# Patient Record
Sex: Male | Born: 1940
Health system: Southern US, Community
[De-identification: ages and names within clinical notes are randomized; demographics above are authoritative.]

## PROBLEM LIST (undated history)

## (undated) DIAGNOSIS — E785 Hyperlipidemia, unspecified: Secondary | ICD-10-CM

## (undated) DIAGNOSIS — Z974 Presence of external hearing-aid: Secondary | ICD-10-CM

## (undated) DIAGNOSIS — F329 Major depressive disorder, single episode, unspecified: Secondary | ICD-10-CM

## (undated) DIAGNOSIS — R918 Other nonspecific abnormal finding of lung field: Secondary | ICD-10-CM

## (undated) DIAGNOSIS — I499 Cardiac arrhythmia, unspecified: Secondary | ICD-10-CM

## (undated) DIAGNOSIS — Z86010 Personal history of colonic polyps: Secondary | ICD-10-CM

## (undated) DIAGNOSIS — M1990 Unspecified osteoarthritis, unspecified site: Secondary | ICD-10-CM

## (undated) DIAGNOSIS — C3490 Malignant neoplasm of unspecified part of unspecified bronchus or lung: Secondary | ICD-10-CM

## (undated) DIAGNOSIS — Z87442 Personal history of urinary calculi: Secondary | ICD-10-CM

## (undated) DIAGNOSIS — I10 Essential (primary) hypertension: Secondary | ICD-10-CM

## (undated) DIAGNOSIS — I4891 Unspecified atrial fibrillation: Secondary | ICD-10-CM

## (undated) DIAGNOSIS — H269 Unspecified cataract: Secondary | ICD-10-CM

## (undated) DIAGNOSIS — N200 Calculus of kidney: Secondary | ICD-10-CM

## (undated) DIAGNOSIS — L219 Seborrheic dermatitis, unspecified: Secondary | ICD-10-CM

## (undated) HISTORY — PX: COLONOSCOPY: SHX174

## (undated) HISTORY — PX: JOINT REPLACEMENT: SHX530

## (undated) HISTORY — PX: WISDOM TOOTH EXTRACTION: SHX21

## (undated) HISTORY — DX: Unspecified atrial fibrillation: I48.91

## (undated) HISTORY — PX: CATARACT EXTRACTION W/ INTRAOCULAR LENS IMPLANT: SHX1309

## (undated) HISTORY — DX: Hyperlipidemia, unspecified: E78.5

## (undated) HISTORY — PX: RADIOFREQUENCY ABLATION: SHX2290

## (undated) HISTORY — DX: Major depressive disorder, single episode, unspecified: F32.9

## (undated) HISTORY — DX: Seborrheic dermatitis, unspecified: L21.9

## (undated) HISTORY — PX: ROTATOR CUFF REPAIR: SHX139

## (undated) HISTORY — DX: Calculus of kidney: N20.0

## (undated) HISTORY — PX: TONSILLECTOMY: SHX5217

## (undated) HISTORY — DX: Essential (primary) hypertension: I10

## (undated) HISTORY — DX: Personal history of colonic polyps: Z86.010

---

## 2001-12-27 ENCOUNTER — Encounter (INDEPENDENT_AMBULATORY_CARE_PROVIDER_SITE_OTHER): Payer: Self-pay | Admitting: Specialist

## 2001-12-27 ENCOUNTER — Ambulatory Visit (HOSPITAL_COMMUNITY): Admission: RE | Admit: 2001-12-27 | Discharge: 2001-12-27 | Payer: Self-pay | Admitting: Gastroenterology

## 2004-03-13 ENCOUNTER — Ambulatory Visit: Payer: Self-pay | Admitting: Internal Medicine

## 2004-03-20 ENCOUNTER — Ambulatory Visit: Payer: Self-pay | Admitting: Internal Medicine

## 2004-08-29 ENCOUNTER — Ambulatory Visit: Payer: Self-pay | Admitting: Internal Medicine

## 2004-09-10 ENCOUNTER — Ambulatory Visit: Payer: Self-pay | Admitting: Internal Medicine

## 2004-09-17 ENCOUNTER — Ambulatory Visit: Payer: Self-pay | Admitting: Internal Medicine

## 2004-11-27 ENCOUNTER — Ambulatory Visit: Payer: Self-pay | Admitting: Internal Medicine

## 2005-03-19 ENCOUNTER — Ambulatory Visit: Payer: Self-pay | Admitting: Internal Medicine

## 2005-03-31 ENCOUNTER — Ambulatory Visit: Payer: Self-pay | Admitting: Internal Medicine

## 2005-09-21 ENCOUNTER — Ambulatory Visit: Payer: Self-pay | Admitting: Internal Medicine

## 2005-09-29 ENCOUNTER — Ambulatory Visit: Payer: Self-pay | Admitting: Internal Medicine

## 2005-10-09 ENCOUNTER — Ambulatory Visit: Payer: Self-pay | Admitting: Internal Medicine

## 2005-12-24 ENCOUNTER — Ambulatory Visit: Payer: Self-pay | Admitting: Internal Medicine

## 2006-03-25 ENCOUNTER — Ambulatory Visit: Payer: Self-pay | Admitting: Internal Medicine

## 2006-03-25 LAB — CONVERTED CEMR LAB
ALT: 22 units/L (ref 0–40)
AST: 25 units/L (ref 0–37)
Albumin: 3.4 g/dL — ABNORMAL LOW (ref 3.5–5.2)
Alkaline Phosphatase: 45 units/L (ref 39–117)
BUN: 12 mg/dL (ref 6–23)
Basophils Absolute: 0.1 10*3/uL (ref 0.0–0.1)
Basophils Relative: 0.8 % (ref 0.0–1.0)
Bilirubin, Direct: 0.1 mg/dL (ref 0.0–0.3)
CO2: 31 meq/L (ref 19–32)
Calcium: 9.3 mg/dL (ref 8.4–10.5)
Chloride: 101 meq/L (ref 96–112)
Creatinine, Ser: 1.1 mg/dL (ref 0.4–1.5)
Eosinophil percent: 1.8 % (ref 0.0–5.0)
GFR calc non Af Amer: 72 mL/min
Glomerular Filtration Rate, Af Am: 87 mL/min/{1.73_m2}
Glucose, Bld: 108 mg/dL — ABNORMAL HIGH (ref 70–99)
HCT: 44.5 % (ref 39.0–52.0)
Hemoglobin: 15.8 g/dL (ref 13.0–17.0)
Lymphocytes Relative: 30.6 % (ref 12.0–46.0)
MCHC: 35.4 g/dL (ref 30.0–36.0)
MCV: 96.1 fL (ref 78.0–100.0)
Monocytes Absolute: 0.6 10*3/uL (ref 0.2–0.7)
Monocytes Relative: 8.7 % (ref 3.0–11.0)
Neutro Abs: 4.3 10*3/uL (ref 1.4–7.7)
Neutrophils Relative %: 58.1 % (ref 43.0–77.0)
PSA: 0.4 ng/mL (ref 0.10–4.00)
Platelets: 188 10*3/uL (ref 150–400)
Potassium: 4.5 meq/L (ref 3.5–5.1)
RBC: 4.63 M/uL (ref 4.22–5.81)
RDW: 11.9 % (ref 11.5–14.6)
Sodium: 139 meq/L (ref 135–145)
Total Bilirubin: 0.9 mg/dL (ref 0.3–1.2)
Total Protein: 6.6 g/dL (ref 6.0–8.3)
WBC: 7.3 10*3/uL (ref 4.5–10.5)

## 2006-09-06 ENCOUNTER — Ambulatory Visit: Payer: Self-pay | Admitting: Internal Medicine

## 2007-02-28 ENCOUNTER — Ambulatory Visit: Payer: Self-pay | Admitting: Internal Medicine

## 2007-02-28 LAB — CONVERTED CEMR LAB
ALT: 21 units/L (ref 0–53)
AST: 23 units/L (ref 0–37)
Albumin: 3.9 g/dL (ref 3.5–5.2)
Alkaline Phosphatase: 43 units/L (ref 39–117)
BUN: 10 mg/dL (ref 6–23)
Basophils Absolute: 0.1 10*3/uL (ref 0.0–0.1)
Basophils Relative: 0.7 % (ref 0.0–1.0)
Bilirubin, Direct: 0.2 mg/dL (ref 0.0–0.3)
CO2: 29 meq/L (ref 19–32)
Calcium: 9.5 mg/dL (ref 8.4–10.5)
Chloride: 106 meq/L (ref 96–112)
Cholesterol: 205 mg/dL (ref 0–200)
Creatinine, Ser: 1.1 mg/dL (ref 0.4–1.5)
Direct LDL: 142.6 mg/dL
Eosinophils Absolute: 0.1 10*3/uL (ref 0.0–0.6)
Eosinophils Relative: 1.4 % (ref 0.0–5.0)
GFR calc Af Amer: 87 mL/min
GFR calc non Af Amer: 72 mL/min
Glucose, Bld: 105 mg/dL — ABNORMAL HIGH (ref 70–99)
HCT: 44 % (ref 39.0–52.0)
HDL: 44.3 mg/dL (ref >39.0)
Hemoglobin: 15.5 g/dL (ref 13.0–17.0)
Hgb A1c MFr Bld: 5.4 % (ref 4.6–6.0)
Lymphocytes Relative: 35 % (ref 12.0–46.0)
MCHC: 35.1 g/dL (ref 30.0–36.0)
MCV: 97.8 fL (ref 78.0–100.0)
Monocytes Absolute: 0.6 10*3/uL (ref 0.2–0.7)
Monocytes Relative: 7.8 % (ref 3.0–11.0)
Neutro Abs: 4.1 10*3/uL (ref 1.4–7.7)
Neutrophils Relative %: 55.1 % (ref 43.0–77.0)
PSA: 0.34 ng/mL (ref 0.10–4.00)
Platelets: 179 10*3/uL (ref 150–400)
Potassium: 4.3 meq/L (ref 3.5–5.1)
RBC: 4.5 M/uL (ref 4.22–5.81)
RDW: 11.7 % (ref 11.5–14.6)
Sodium: 142 meq/L (ref 135–145)
TSH: 4.56 microintl units/mL (ref 0.35–5.50)
Total Bilirubin: 0.9 mg/dL (ref 0.3–1.2)
Total CHOL/HDL Ratio: 4.6
Total Protein: 6.7 g/dL (ref 6.0–8.3)
Triglycerides: 100 mg/dL (ref 0–149)
VLDL: 20 mg/dL (ref 0–40)
WBC: 7.5 10*3/uL (ref 4.5–10.5)

## 2007-03-04 DIAGNOSIS — I10 Essential (primary) hypertension: Secondary | ICD-10-CM | POA: Insufficient documentation

## 2007-03-04 DIAGNOSIS — F3289 Other specified depressive episodes: Secondary | ICD-10-CM

## 2007-03-04 DIAGNOSIS — E785 Hyperlipidemia, unspecified: Secondary | ICD-10-CM

## 2007-03-04 DIAGNOSIS — I4891 Unspecified atrial fibrillation: Secondary | ICD-10-CM

## 2007-03-04 DIAGNOSIS — Z860100 Personal history of colon polyps, unspecified: Secondary | ICD-10-CM | POA: Insufficient documentation

## 2007-03-04 DIAGNOSIS — F329 Major depressive disorder, single episode, unspecified: Secondary | ICD-10-CM

## 2007-03-04 HISTORY — DX: Essential (primary) hypertension: I10

## 2007-03-04 HISTORY — DX: Major depressive disorder, single episode, unspecified: F32.9

## 2007-03-04 HISTORY — DX: Other specified depressive episodes: F32.89

## 2007-03-04 HISTORY — DX: Hyperlipidemia, unspecified: E78.5

## 2007-03-04 HISTORY — DX: Unspecified atrial fibrillation: I48.91

## 2007-03-07 ENCOUNTER — Ambulatory Visit: Payer: Self-pay | Admitting: Internal Medicine

## 2007-03-21 ENCOUNTER — Ambulatory Visit: Payer: Self-pay | Admitting: Gastroenterology

## 2007-04-05 ENCOUNTER — Encounter: Payer: Self-pay | Admitting: Gastroenterology

## 2007-04-05 ENCOUNTER — Encounter: Payer: Self-pay | Admitting: Internal Medicine

## 2007-04-05 ENCOUNTER — Ambulatory Visit: Payer: Self-pay | Admitting: Gastroenterology

## 2007-04-05 DIAGNOSIS — Z860100 Personal history of colon polyps, unspecified: Secondary | ICD-10-CM

## 2007-04-05 HISTORY — DX: Personal history of colon polyps, unspecified: Z86.0100

## 2007-04-05 HISTORY — DX: Personal history of colonic polyps: Z86.010

## 2007-04-18 ENCOUNTER — Ambulatory Visit: Payer: Self-pay | Admitting: Internal Medicine

## 2007-07-19 ENCOUNTER — Ambulatory Visit: Payer: Self-pay | Admitting: Internal Medicine

## 2007-07-19 DIAGNOSIS — L219 Seborrheic dermatitis, unspecified: Secondary | ICD-10-CM

## 2007-07-19 HISTORY — DX: Seborrheic dermatitis, unspecified: L21.9

## 2008-01-18 ENCOUNTER — Ambulatory Visit: Payer: Self-pay | Admitting: Internal Medicine

## 2008-03-09 ENCOUNTER — Telehealth: Payer: Self-pay | Admitting: Internal Medicine

## 2008-07-19 ENCOUNTER — Ambulatory Visit: Payer: Self-pay | Admitting: Internal Medicine

## 2008-07-19 LAB — CONVERTED CEMR LAB
Blood in Urine, dipstick: NEGATIVE
Glucose, Urine, Semiquant: NEGATIVE
Nitrite: NEGATIVE
Specific Gravity, Urine: 1.02
Urobilinogen, UA: 0.2
pH: 5.5

## 2008-07-20 LAB — CONVERTED CEMR LAB
ALT: 27 units/L (ref 0–53)
AST: 26 units/L (ref 0–37)
Albumin: 4 g/dL (ref 3.5–5.2)
Alkaline Phosphatase: 50 units/L (ref 39–117)
BUN: 11 mg/dL (ref 6–23)
Basophils Relative: 1 % (ref 0.0–3.0)
Bilirubin, Direct: 0 mg/dL (ref 0.0–0.3)
CO2: 30 meq/L (ref 19–32)
Calcium: 9.5 mg/dL (ref 8.4–10.5)
Chloride: 106 meq/L (ref 96–112)
Cholesterol: 219 mg/dL — ABNORMAL HIGH (ref 0–200)
Creatinine, Ser: 1 mg/dL (ref 0.4–1.5)
Direct LDL: 152.9 mg/dL
Eosinophils Relative: 1.8 % (ref 0.0–5.0)
GFR calc non Af Amer: 79.41 mL/min (ref >60)
Glucose, Bld: 110 mg/dL — ABNORMAL HIGH (ref 70–99)
HCT: 45.8 % (ref 39.0–52.0)
HDL: 42.8 mg/dL (ref >39.00)
Hemoglobin: 15.5 g/dL (ref 13.0–17.0)
Lymphocytes Relative: 28.6 % (ref 12.0–46.0)
MCHC: 33.7 g/dL (ref 30.0–36.0)
MCV: 99.9 fL (ref 78.0–100.0)
Monocytes Relative: 7.5 % (ref 3.0–12.0)
Neutrophils Relative %: 61.1 % (ref 43.0–77.0)
PSA: 0.38 ng/mL (ref 0.10–4.00)
Platelets: 187 10*3/uL (ref 150.0–400.0)
Potassium: 4.8 meq/L (ref 3.5–5.1)
RBC: 4.59 M/uL (ref 4.22–5.81)
RDW: 12.2 % (ref 11.5–14.6)
Sodium: 143 meq/L (ref 135–145)
TSH: 3.94 microintl units/mL (ref 0.35–5.50)
Total Bilirubin: 0.9 mg/dL (ref 0.3–1.2)
Total CHOL/HDL Ratio: 5
Total Protein: 7.2 g/dL (ref 6.0–8.3)
Triglycerides: 116 mg/dL (ref 0.0–149.0)
VLDL: 23.2 mg/dL (ref 0.0–40.0)
WBC: 7.7 10*3/uL (ref 4.5–10.5)

## 2009-01-04 ENCOUNTER — Ambulatory Visit: Payer: Self-pay | Admitting: Internal Medicine

## 2009-02-04 ENCOUNTER — Ambulatory Visit: Payer: Self-pay | Admitting: Internal Medicine

## 2009-02-04 LAB — CONVERTED CEMR LAB
Bilirubin Urine: NEGATIVE
Blood in Urine, dipstick: NEGATIVE
Glucose, Urine, Semiquant: NEGATIVE
Ketones, urine, test strip: NEGATIVE
Nitrite: NEGATIVE
Protein, U semiquant: NEGATIVE
Specific Gravity, Urine: 1.015
Urobilinogen, UA: 0.2
WBC Urine, dipstick: NEGATIVE
pH: 5.5

## 2009-02-05 ENCOUNTER — Encounter: Payer: Self-pay | Admitting: Internal Medicine

## 2009-04-11 ENCOUNTER — Ambulatory Visit: Payer: Self-pay | Admitting: Internal Medicine

## 2009-04-11 LAB — CONVERTED CEMR LAB
Blood in Urine, dipstick: NEGATIVE
Ketones, urine, test strip: NEGATIVE
Nitrite: NEGATIVE
Protein, U semiquant: NEGATIVE
Urobilinogen, UA: 0.2
WBC Urine, dipstick: NEGATIVE

## 2009-04-12 LAB — CONVERTED CEMR LAB
ALT: 23 units/L (ref 0–53)
AST: 25 units/L (ref 0–37)
Alkaline Phosphatase: 48 units/L (ref 39–117)
Basophils Absolute: 0.1 10*3/uL (ref 0.0–0.1)
Calcium: 8.8 mg/dL (ref 8.4–10.5)
Eosinophils Relative: 2 % (ref 0.0–5.0)
GFR calc non Af Amer: 70.98 mL/min (ref >60)
Glucose, Bld: 106 mg/dL — ABNORMAL HIGH (ref 70–99)
HDL: 42.6 mg/dL (ref >39.00)
Hemoglobin: 14.9 g/dL (ref 13.0–17.0)
Lymphocytes Relative: 28.7 % (ref 12.0–46.0)
Monocytes Relative: 7.2 % (ref 3.0–12.0)
Neutro Abs: 4.6 10*3/uL (ref 1.4–7.7)
Platelets: 181 10*3/uL (ref 150.0–400.0)
Potassium: 4.1 meq/L (ref 3.5–5.1)
RDW: 11.9 % (ref 11.5–14.6)
Sodium: 136 meq/L (ref 135–145)
Total Bilirubin: 0.7 mg/dL (ref 0.3–1.2)
Total CHOL/HDL Ratio: 5
Triglycerides: 210 mg/dL — ABNORMAL HIGH (ref 0.0–149.0)
VLDL: 42 mg/dL — ABNORMAL HIGH (ref 0.0–40.0)
WBC: 7.5 10*3/uL (ref 4.5–10.5)

## 2009-05-23 ENCOUNTER — Ambulatory Visit: Payer: Self-pay | Admitting: Internal Medicine

## 2009-10-01 ENCOUNTER — Ambulatory Visit: Payer: Self-pay | Admitting: Internal Medicine

## 2009-10-25 ENCOUNTER — Telehealth: Payer: Self-pay | Admitting: Internal Medicine

## 2010-04-07 ENCOUNTER — Ambulatory Visit
Admission: RE | Admit: 2010-04-07 | Discharge: 2010-04-07 | Payer: Self-pay | Source: Home / Self Care | Attending: Internal Medicine | Admitting: Internal Medicine

## 2010-04-07 ENCOUNTER — Other Ambulatory Visit: Payer: Self-pay | Admitting: Internal Medicine

## 2010-04-07 LAB — TSH: TSH: 2.9 u[IU]/mL (ref 0.35–5.50)

## 2010-04-07 LAB — LIPID PANEL
Cholesterol: 223 mg/dL — ABNORMAL HIGH (ref 0–200)
HDL: 52.5 mg/dL (ref >39.00)
Total CHOL/HDL Ratio: 4
Triglycerides: 152 mg/dL — ABNORMAL HIGH (ref 0.0–149.0)
VLDL: 30.4 mg/dL (ref 0.0–40.0)

## 2010-04-07 LAB — CBC WITH DIFFERENTIAL/PLATELET
Basophils Absolute: 0.1 10*3/uL (ref 0.0–0.1)
Basophils Relative: 0.6 % (ref 0.0–3.0)
Eosinophils Absolute: 0.2 10*3/uL (ref 0.0–0.7)
Eosinophils Relative: 1.9 % (ref 0.0–5.0)
HCT: 44.3 % (ref 39.0–52.0)
Hemoglobin: 15.3 g/dL (ref 13.0–17.0)
Lymphocytes Relative: 30.4 % (ref 12.0–46.0)
Lymphs Abs: 2.6 10*3/uL (ref 0.7–4.0)
MCHC: 34.5 g/dL (ref 30.0–36.0)
MCV: 97.4 fl (ref 78.0–100.0)
Monocytes Absolute: 0.8 10*3/uL (ref 0.1–1.0)
Monocytes Relative: 9 % (ref 3.0–12.0)
Neutro Abs: 5 10*3/uL (ref 1.4–7.7)
Neutrophils Relative %: 58.1 % (ref 43.0–77.0)
Platelets: 203 10*3/uL (ref 150.0–400.0)
RBC: 4.55 Mil/uL (ref 4.22–5.81)
RDW: 12.7 % (ref 11.5–14.6)
WBC: 8.6 10*3/uL (ref 4.5–10.5)

## 2010-04-07 LAB — BASIC METABOLIC PANEL
BUN: 16 mg/dL (ref 6–23)
CO2: 28 mEq/L (ref 19–32)
Calcium: 9.5 mg/dL (ref 8.4–10.5)
Chloride: 103 mEq/L (ref 96–112)
Creatinine, Ser: 1 mg/dL (ref 0.4–1.5)
GFR: 79.92 mL/min (ref >60.00)
Glucose, Bld: 91 mg/dL (ref 70–99)
Potassium: 4.7 mEq/L (ref 3.5–5.1)
Sodium: 141 mEq/L (ref 135–145)

## 2010-04-07 LAB — HEPATIC FUNCTION PANEL
ALT: 23 U/L (ref 0–53)
AST: 25 U/L (ref 0–37)
Albumin: 4 g/dL (ref 3.5–5.2)
Alkaline Phosphatase: 54 U/L (ref 39–117)
Bilirubin, Direct: 0.1 mg/dL (ref 0.0–0.3)
Total Bilirubin: 0.9 mg/dL (ref 0.3–1.2)
Total Protein: 7 g/dL (ref 6.0–8.3)

## 2010-04-07 LAB — LDL CHOLESTEROL, DIRECT: Direct LDL: 163.7 mg/dL

## 2010-04-24 ENCOUNTER — Ambulatory Visit
Admission: RE | Admit: 2010-04-24 | Discharge: 2010-04-24 | Payer: Self-pay | Source: Home / Self Care | Attending: Internal Medicine | Admitting: Internal Medicine

## 2010-05-01 NOTE — Assessment & Plan Note (Signed)
Summary: 6 wks rov/mm   Vital Signs:  Patient profile:   70 year old male Height:      70.5 inches Weight:      224 pounds BMI:     31.80 Temp:     98.1 degrees F oral Pulse rate:   68 / minute Resp:     14 per minute BP sitting:   146 / 90  (left arm)  Vitals Entered By: Willy Eddy, LPN (May 23, 2009 8:13 AM) CC: roa-f/u pradaxa - no problems   CC:  roa-f/u pradaxa - no problems.  Preventive Screening-Counseling & Management  Alcohol-Tobacco     Alcohol drinks/day: 2     Smoking Status: quit     Year Started: 1969     Year Quit: 1979  Current Problems (verified): 1)  Dermatitis, Seborrheic  (ICD-690.10) 2)  Preventive Health Care  (ICD-V70.0) 3)  Family History Osteoporosis  (ICD-V17.8) 4)  Fibrillation, Atrial  (ICD-427.31) 5)  Hypertension  (ICD-401.9) 6)  Hyperlipidemia  (ICD-272.4) 7)  Depression  (ICD-311) 8)  Colonic Polyps, Hx of  (ICD-V12.72)  Current Medications (verified): 1)  Aspirin 325 Mg Tabs (Aspirin) .... Take 2 Tablet By Mouth Once A Day 2)  Atenolol 50 Mg Tabs (Atenolol) .... Take 1 Tablet By Mouth Twice A Day 3)  Triamcinolone Acetonide 0.5 % Crea (Triamcinolone Acetonide) .... Apply 1 A Small Amount To Affected Area Twice A Day As Needed 4)  Selenos 2.25 %  Sham (Selenium Sulf-Pyrithione-Urea) .... Shampoo 3 Times Weekly 5)  Pradaxa 150 Mg .Marland Kitchen.. 1 By Mouth Two Times A Day  Allergies (verified): No Known Drug Allergies  Physical Exam  General:  Well-developed,well-nourished,in no acute distress; alert,appropriate and cooperative throughout examination Head:  normocephalic and atraumatic.   Neck:  No deformities, masses, or tenderness noted. Chest Wall:  No deformities, masses, tenderness or gynecomastia noted. Heart:  irr irr rate Abdomen:  Bowel sounds positive,abdomen soft and non-tender without masses, organomegaly or hernias noted.   Impression & Recommendations:  Problem # 1:  FIBRILLATION, ATRIAL (ICD-427.31)  The  following medications were removed from the medication list:    Aspirin 325 Mg Tabs (Aspirin) .Marland Kitchen... Take 2 tablet by mouth once a day His updated medication list for this problem includes:    Atenolol 50 Mg Tabs (Atenolol) .Marland Kitchen... Take 1 tablet by mouth twice a day  Complete Medication List: 1)  Atenolol 50 Mg Tabs (Atenolol) .... Take 1 tablet by mouth twice a day 2)  Triamcinolone Acetonide 0.5 % Crea (Triamcinolone acetonide) .... Apply 1 a small amount to affected area twice a day as needed 3)  Selenos 2.25 % Sham (Selenium sulf-pyrithione-urea) .... Shampoo 3 times weekly 4)  Pradaxa 150 Mg Caps (Dabigatran etexilate mesylate) .... Take 1 tablet by mouth two times a day  Patient Instructions: 1)  Please schedule a follow-up appointment in 4 months. Prescriptions: ATENOLOL 50 MG TABS (ATENOLOL) Take 1 tablet by mouth twice a day  #180 x 1   Entered and Authorized by:   Birdie Sons MD   Signed by:   Birdie Sons MD on 05/23/2009   Method used:   Print then Give to Patient   RxID:   [id number] PRADAXA 150 MG CAPS (DABIGATRAN ETEXILATE MESYLATE) Take 1 tablet by mouth two times a day  #180 x 3   Entered and Authorized by:   Birdie Sons MD   Signed by:   Birdie Sons MD on 05/23/2009   Method used:  Print then Give to Patient   RxID:   [id number]

## 2010-05-01 NOTE — Progress Notes (Signed)
Summary: pradaxa refill  Phone Note Refill Request Message from:  Fax from Pharmacy on October 25, 2009 10:35 AM  Refills Requested: Medication #1:  PRADAXA 150 MG CAPS Take 1 tablet by mouth two times a day. Initial call taken by: Kern Reap CMA Duncan Dull),  October 25, 2009 10:35 AM    Prescriptions: PRADAXA 150 MG CAPS (DABIGATRAN ETEXILATE MESYLATE) Take 1 tablet by mouth two times a day  #180 x 2   Entered by:   Kern Reap CMA (AAMA)   Authorized by:   Birdie Sons MD   Signed by:   Kern Reap CMA (AAMA) on 10/25/2009   Method used:   Faxed to ...       [facility] (mail-order)             , [place]         Ph: [phone]       Fax: [phone]   RxID:   [id number]

## 2010-05-01 NOTE — Assessment & Plan Note (Signed)
Summary: EMP CPX//PT WILL BE FASTING//LH   Vital Signs:  Patient profile:   70 year old male Height:      70.5 inches Weight:      224 pounds Temp:     97.5 degrees F Pulse rate:   56 / minute Resp:     12 per minute BP sitting:   128 / 84  (left arm)  Vitals Entered By: Gladis Riffle, RN (April 11, 2009 8:00 AM)   History of Present Illness: cpx  Preventive Screening-Counseling & Management  Alcohol-Tobacco     Alcohol drinks/day: 2     Smoking Status: quit     Year Started: 1969     Year Quit: 1979  Current Problems (verified): 1)  Dermatitis, Seborrheic  (ICD-690.10) 2)  Preventive Health Care  (ICD-V70.0) 3)  Family History Osteoporosis  (ICD-V17.8) 4)  Fibrillation, Atrial  (ICD-427.31) 5)  Hypertension  (ICD-401.9) 6)  Hyperlipidemia  (ICD-272.4) 7)  Depression  (ICD-311) 8)  Colonic Polyps, Hx of  (ICD-V12.72)  Allergies (verified): No Known Drug Allergies  Comments:  Nurse/Medical Assistant: annual review, fasting  The patient's medications and allergies were reviewed with the patient and were updated in the Medication and Allergy Lists. Gladis Riffle, RN (April 11, 2009 8:01 AM)  Past History:  Past Medical History: Last updated: 03/04/2007 Colonic polyps, hx of Depression Hyperlipidemia Hypertension atrial fibrillation--refuse coumadin  Past Surgical History: Last updated: 03/04/2007 Tonsillectomy, adenoidectomy RF ablation X2 for a fib  Family History: Last updated: 03/04/2007 Family History Lung cancer--father  Family History Osteoporosis--mother  Social History: Last updated: 03/07/2007 Occupation:--job is changing---not nearly as enjoyable.  Former Smoker long term relationship Alcohol use-yes  Risk Factors: Alcohol Use: 2 (04/11/2009)  Risk Factors: Smoking Status: quit (04/11/2009)  Review of Systems       All other systems reviewed and were negative   Physical Exam  General:  Well-developed,well-nourished,in no  acute distress; alert,appropriate and cooperative throughout examination Head:  normocephalic and atraumatic.   Eyes:  pupils equal and pupils round.   Neck:  No deformities, masses, or tenderness noted. Chest Wall:  No deformities, masses, tenderness or gynecomastia noted. Lungs:  normal respiratory effort and no intercostal retractions.   Heart:  irr irr rate Abdomen:  Bowel sounds positive,abdomen soft and non-tender without masses, organomegaly or hernias noted. Rectal:  no external abnormalities and no hemorrhoids.   Prostate:  no nodules and no asymmetry.   Msk:  No deformity or scoliosis noted of thoracic or lumbar spine.   Neurologic:  cranial nerves II-XII intact and gait normal.     Impression & Recommendations:  Problem # 1:  PREVENTIVE HEALTH CARE (ICD-V70.0)  Orders: Venipuncture (16109) TLB-Lipid Panel (80061-LIPID) TLB-BMP (Basic Metabolic Panel-BMET) (80048-METABOL) TLB-CBC Platelet - w/Differential (85025-CBCD) TLB-Hepatic/Liver Function Pnl (80076-HEPATIC) TLB-TSH (Thyroid Stimulating Hormone) (84443-TSH) TLB-PSA (Prostate Specific Antigen) (84153-PSA)  Problem # 2:  FIBRILLATION, ATRIAL (ICD-427.31) discussed at length he was unable to tolerate warfarin discussed recent data trial pradaxa 150 mg by mouth two times a day side effects discussed His updated medication list for this problem includes:    Aspirin 325 Mg Tabs (Aspirin) .Marland Kitchen... Take 2 tablet by mouth once a day    Atenolol 50 Mg Tabs (Atenolol) .Marland Kitchen... Take 1 tablet by mouth twice a day  Problem # 3:  HYPERLIPIDEMIA (ICD-272.4)  Labs Reviewed: SGOT: 26 (07/19/2008)   SGPT: 27 (07/19/2008)   HDL:42.80 (07/19/2008), 44.3 (02/28/2007)  LDL:DEL (02/28/2007)  Chol:219 (07/19/2008), 205 (02/28/2007)  Trig:116.0 (07/19/2008), 100 (02/28/2007)  Complete Medication List: 1)  Aspirin 325 Mg Tabs (Aspirin) .... Take 2 tablet by mouth once a day 2)  Atenolol 50 Mg Tabs (Atenolol) .... Take 1 tablet by mouth  twice a day 3)  Triamcinolone Acetonide 0.5 % Crea (Triamcinolone acetonide) .... Apply 1 a small amount to affected area twice a day as needed 4)  Selenos 2.25 % Sham (Selenium sulf-pyrithione-urea) .... Shampoo 3 times weekly 5)  Pradaxa 150 Mg  .Marland KitchenMarland Kitchen. 1 by mouth two times a day  Patient Instructions: 1)  see me 6 weeks  Prescriptions: PRADAXA 150 MG 1 by mouth two times a day  #60 x 6   Entered and Authorized by:   Birdie Sons MD   Signed by:   Birdie Sons MD on 04/11/2009   Method used:   Faxed to ...       [facility]  [address]  [phone]* (retail)       [address]       [place], [place]  [address]       Ph: [phone] or [phone]       Fax: [phone]   RxID:   [id number] PRADAXA 75 MG 1 by mouth two times a day  #60 x 11   Entered and Authorized by:   Birdie Sons MD   Signed by:   Birdie Sons MD on 04/11/2009   Method used:   Print then Give to Patient   RxID:   [id number]     Laboratory Results   Urine Tests    Routine Urinalysis   Color: yellow Appearance: Clear Glucose: negative   (Normal Range: Negative) Bilirubin: negative   (Normal Range: Negative) Ketone: negative   (Normal Range: Negative) Spec. Gravity: 1.015   (Normal Range: 1.003-1.035) Blood: negative   (Normal Range: Negative) pH: 6.0   (Normal Range: 5.0-8.0) Protein: negative   (Normal Range: Negative) Urobilinogen: 0.2   (Normal Range: 0-1) Nitrite: negative   (Normal Range: Negative) Leukocyte Esterace: negative   (Normal Range: Negative)    Comments: Rita Ohara  April 11, 2009 10:10 AM

## 2010-05-01 NOTE — Assessment & Plan Note (Signed)
Summary: 4 month fup//ccm pt rsc/njr/pt rsc from bmp/cjr   Vital Signs:  Patient profile:   70 year old male Weight:      209 pounds BMI:     29.67 Temp:     97.9 degrees F oral Pulse rate:   64 / minute Pulse rhythm:   regular Resp:     12 per minute BP sitting:   108 / 72  (left arm) Cuff size:   regular  Vitals Entered By: Gladis Riffle, RN (October 01, 2009 10:05 AM) CC: 4 month rov Is Patient Diabetic? No   CC:  4 month rov.  History of Present Illness:  Follow-Up Visit      This is a 70 year old man who presents for Follow-up visit.  The patient denies chest pain and palpitations.  Since the last visit the patient notes no new problems or concerns.  The patient reports taking meds as prescribed.  When questioned about possible medication side effects, the patient notes none.   tolderating dabigatran (no bleeding complications) All other systems reviewed and were negative   Preventive Screening-Counseling & Management  Alcohol-Tobacco     Alcohol drinks/day: 2     Smoking Status: quit     Year Started: 1969     Year Quit: 1979  Current Medications (verified): 1)  Atenolol 50 Mg Tabs (Atenolol) .... Take 1 Tablet By Mouth Twice A Day 2)  Triamcinolone Acetonide 0.5 % Crea (Triamcinolone Acetonide) .... Apply 1 A Small Amount To Affected Area Twice A Day As Needed 3)  Selenos 2.25 %  Sham (Selenium Sulf-Pyrithione-Urea) .... Shampoo 3 Times Weekly 4)  Pradaxa 150 Mg Caps (Dabigatran Etexilate Mesylate) .... Take 1 Tablet By Mouth Two Times A Day  Allergies (verified): No Known Drug Allergies  Physical Exam  General:  alert and well-developed.   Head:  normocephalic and atraumatic.   Neck:  No deformities, masses, or tenderness noted. Chest Wall:  No deformities, masses, tenderness or gynecomastia noted. Lungs:  normal respiratory effort and no intercostal retractions.   Heart:  irr irr rate Abdomen:  Bowel sounds positive,abdomen soft and non-tender without masses,  organomegaly or hernias noted.   Impression & Recommendations:  Problem # 1:  FIBRILLATION, ATRIAL (ICD-427.31) rate controlled on pradaxa---rare epistaxis His updated medication list for this problem includes:    Atenolol 50 Mg Tabs (Atenolol) .Marland Kitchen... Take 1 tablet by mouth twice a day  Problem # 2:  HYPERTENSION (ICD-401.9) controlled continue current medications  His updated medication list for this problem includes:    Atenolol 50 Mg Tabs (Atenolol) .Marland Kitchen... Take 1 tablet by mouth twice a day  BP today: 108/72 Prior BP: 146/90 (05/23/2009)  Labs Reviewed: K+: 4.1 (04/11/2009) Creat: : 1.1 (04/11/2009)   Chol: 209 (04/11/2009)   HDL: 42.60 (04/11/2009)   LDL: DEL (02/28/2007)   TG: 210.0 (04/11/2009)  Complete Medication List: 1)  Atenolol 50 Mg Tabs (Atenolol) .... Take 1 tablet by mouth twice a day 2)  Triamcinolone Acetonide 0.5 % Crea (Triamcinolone acetonide) .... Apply 1 a small amount to affected area twice a day as needed 3)  Selenos 2.25 % Sham (Selenium sulf-pyrithione-urea) .... Shampoo 3 times weekly 4)  Pradaxa 150 Mg Caps (Dabigatran etexilate mesylate) .... Take 1 tablet by mouth two times a day  Patient Instructions: 1)  Please schedule a follow-up appointment in 6 months.

## 2010-05-01 NOTE — Assessment & Plan Note (Signed)
Summary: 6 MONTH ROV/[facility] BMP/[facility]JR   Vital Signs:  Patient profile:   70 year old male Weight:      216 pounds Temp:     98.4 degrees F oral Pulse rate:   96 / minute BP sitting:   112 / 84  (left arm) Cuff size:   large  Vitals Entered By: Alfred Levins, CMA (April 07, 2010 9:00 AM) CC: f/u   CC:  f/u.  History of Present Illness:  Follow-Up Visit      This is a 70 year old man who presents for Follow-up visit.  The patient denies chest pain and palpitations.  Since the last visit the patient notes no new problems or concerns.  The patient reports taking meds as prescribed.  When questioned about possible medication side effects, the patient notes none.   tolderating dabigatran (no bleeding complications) All other systems reviewed and were negative   Current Problems (verified): 1)  Dermatitis, Seborrheic  (ICD-690.10) 2)  Preventive Health Care  (ICD-V70.0) 3)  Family History Osteoporosis  (ICD-V17.8) 4)  Fibrillation, Atrial  (ICD-427.31) 5)  Hypertension  (ICD-401.9) 6)  Hyperlipidemia  (ICD-272.4) 7)  Depression  (ICD-311) 8)  Colonic Polyps, Hx of  (ICD-V12.72)  Current Medications (verified): 1)  Atenolol 50 Mg Tabs (Atenolol) .... Take 1 Tablet By Mouth Twice A Day 2)  Triamcinolone Acetonide 0.5 % Crea (Triamcinolone Acetonide) .... Apply 1 A Small Amount To Affected Area Twice A Day As Needed 3)  Selenos 2.25 %  Sham (Selenium Sulf-Pyrithione-Urea) .... Shampoo 3 Times Weekly 4)  Pradaxa 150 Mg Caps (Dabigatran Etexilate Mesylate) .... Take 1 Tablet By Mouth Two Times A Day  Allergies (verified): No Known Drug Allergies  Past History:  Past Medical History: Last updated: 03/04/2007 Colonic polyps, hx of Depression Hyperlipidemia Hypertension atrial fibrillation--refuse coumadin  Past Surgical History: Last updated: 03/04/2007 Tonsillectomy, adenoidectomy RF ablation X2 for a fib  Family History: Last updated: 03/04/2007 Family History Lung  cancer--father  Family History Osteoporosis--mother  Social History: Last updated: 03/07/2007 Occupation:--job is changing---not nearly as enjoyable.  Former Smoker long term relationship Alcohol use-yes  Risk Factors: Alcohol Use: 2 (10/01/2009)  Risk Factors: Smoking Status: quit (10/01/2009)  Physical Exam  General:   well-developed well-nourished male in no acute distress. HEENT exam atraumatic, normocephalic, Lexapro to auscultation cardiac exam S1-S2 are regular. Abdominal exam moderately overweight, active bowel sounds, soft. Extremities no edema. Neurologic exam alert with normal gait.   Impression & Recommendations:  Problem # 1:  HYPERTENSION (ICD-401.9)  well controlled. Continue current medications. His updated medication list for this problem includes:    Atenolol 50 Mg Tabs (Atenolol) .Marland Kitchen... Take 1 tablet by mouth twice a day  BP today: 112/84 Prior BP: 108/72 (10/01/2009)  Labs Reviewed: K+: 4.1 (04/11/2009) Creat: : 1.1 (04/11/2009)   Chol: 209 (04/11/2009)   HDL: 42.60 (04/11/2009)   LDL: DEL (02/28/2007)   TG: 210.0 (04/11/2009)  Orders: TLB-BMP (Basic Metabolic Panel-BMET) (80048-METABOL)  Problem # 2:  FIBRILLATION, ATRIAL (ICD-427.31)  tolerating pradaxa His updated medication list for this problem includes:    Atenolol 50 Mg Tabs (Atenolol) .Marland Kitchen... Take 1 tablet by mouth twice a day  Orders: TLB-CBC Platelet - w/Differential (85025-CBCD)  Problem # 3:  HYPERLIPIDEMIA (ICD-272.4) will check today Labs Reviewed: SGOT: 25 (04/11/2009)   SGPT: 23 (04/11/2009)   HDL:42.60 (04/11/2009), 42.80 (07/19/2008)  LDL:DEL (02/28/2007)  Chol:209 (04/11/2009), 219 (07/19/2008)  Trig:210.0 (04/11/2009), 116.0 (07/19/2008)  Orders: Venipuncture (16109) TLB-Lipid Panel (80061-LIPID) TLB-Hepatic/Liver  Function Pnl (80076-HEPATIC) TLB-TSH (Thyroid Stimulating Hormone) (84443-TSH) he describes ETD---has had a long time ears appear clear on exam---will try  fluticasone  he admits to some hearing loss, gradual.   Complete Medication List: 1)  Atenolol 50 Mg Tabs (Atenolol) .... Take 1 tablet by mouth twice a day 2)  Triamcinolone Acetonide 0.5 % Crea (Triamcinolone acetonide) .... Apply 1 a small amount to affected area twice a day as needed 3)  Selenos 2.25 % Sham (Selenium sulf-pyrithione-urea) .... Shampoo 3 times weekly 4)  Pradaxa 150 Mg Caps (Dabigatran etexilate mesylate) .... Take 1 tablet by mouth two times a day 5)  Fluticasone Propionate 50 Mcg/act Susp (Fluticasone propionate) .... 2 sprays each nostril once daily Prescriptions: FLUTICASONE PROPIONATE 50 MCG/ACT  SUSP (FLUTICASONE PROPIONATE) 2 sprays each nostril once daily  #1 vial x 3   Entered and Authorized by:   Birdie Sons MD   Signed by:   Birdie Sons MD on 04/07/2010   Method used:   Electronically to        [facility]  [phone]* (retail)       [address]       [place], [place]  [address]       Ph: [phone] or [phone]       Fax: [phone]   RxID:   [id number]    Orders Added: 1)  Venipuncture [34742] 2)  TLB-Lipid Panel [80061-LIPID] 3)  TLB-Hepatic/Liver Function Pnl [80076-HEPATIC] 4)  TLB-TSH (Thyroid Stimulating Hormone) [84443-TSH] 5)  TLB-BMP (Basic Metabolic Panel-BMET) [80048-METABOL] 6)  TLB-CBC Platelet - w/Differential [85025-CBCD] 7)  Est. Patient Level IV [59563]  Appended Document: Orders Update     Clinical Lists Changes  Orders: Added new Service order of Specimen Handling (87564) - Signed

## 2010-05-07 NOTE — Assessment & Plan Note (Signed)
Summary: depression/ccm   Vital Signs:  Patient profile:   70 year old male Weight:      215 pounds Temp:     98.2 degrees F oral Pulse rate:   88 / minute Pulse rhythm:   regular BP sitting:   116 / 72  (left arm) Cuff size:   large  Vitals Entered By: Alfred Levins, CMA (April 24, 2010 9:29 AM) CC: depression   CC:  depression.  History of Present Illness: hx of depression lack of energy "dark cloud"  Current Medications (verified): 1)  Atenolol 50 Mg Tabs (Atenolol) .... Take 1 Tablet By Mouth Twice A Day 2)  Triamcinolone Acetonide 0.5 % Crea (Triamcinolone Acetonide) .... Apply 1 A Small Amount To Affected Area Twice A Day As Needed 3)  Selenos 2.25 %  Sham (Selenium Sulf-Pyrithione-Urea) .... Shampoo 3 Times Weekly 4)  Pradaxa 150 Mg Caps (Dabigatran Etexilate Mesylate) .... Take 1 Tablet By Mouth Two Times A Day 5)  Fluticasone Propionate 50 Mcg/act  Susp (Fluticasone Propionate) .... 2 Sprays Each Nostril Once Daily  Allergies (verified): No Known Drug Allergies   Impression & Recommendations:  Problem # 1:  DEPRESSION (ICD-311) prolonged discussion regarding depression >1/2 time spent counselling and discussing medications side effects discussed expectations discussed he has had previous success with serzone.   total time 20 minutes His updated medication list for this problem includes:    Nefazodone Hcl 100 Mg Tabs (Nefazodone hcl) .Marland Kitchen... 1 by mouth once daily  Complete Medication List: 1)  Atenolol 50 Mg Tabs (Atenolol) .... Take 1 tablet by mouth twice a day 2)  Triamcinolone Acetonide 0.5 % Crea (Triamcinolone acetonide) .... Apply 1 a small amount to affected area twice a day as needed 3)  Selenos 2.25 % Sham (Selenium sulf-pyrithione-urea) .... Shampoo 3 times weekly 4)  Pradaxa 150 Mg Caps (Dabigatran etexilate mesylate) .... Take 1 tablet by mouth two times a day 5)  Fluticasone Propionate 50 Mcg/act Susp (Fluticasone propionate) .... 2 sprays  each nostril once daily 6)  Nefazodone Hcl 100 Mg Tabs (Nefazodone hcl) .Marland Kitchen.. 1 by mouth once daily  Patient Instructions: 1)  see me 6 weeks 2)  start nefazadone 1/2 by mouth once daily for 7 days and then increase to 1 by mouth once daily  Prescriptions: NEFAZODONE HCL 100 MG TABS (NEFAZODONE HCL) 1 by mouth once daily  #90 x 3   Entered and Authorized by:   Birdie Sons MD   Signed by:   Birdie Sons MD on 04/24/2010   Method used:   Electronically to        [facility]* (retail)             ,          Ph: [phone]       Fax: [phone]   RxID:   [id number] NEFAZODONE HCL 100 MG TABS (NEFAZODONE HCL) 1 by mouth once daily  #90 x 3   Entered and Authorized by:   Birdie Sons MD   Signed by:   Birdie Sons MD on 04/24/2010   Method used:   Electronically to        [facility]  [phone]* (retail)       [address]       [place], [place]  [address]       Ph: [phone] or [phone]       Fax: [phone]   RxID:  [id number]    Orders Added: 1)  Est. Patient Level III [14782]

## 2010-06-04 ENCOUNTER — Encounter: Payer: Self-pay | Admitting: Internal Medicine

## 2010-06-05 ENCOUNTER — Ambulatory Visit (INDEPENDENT_AMBULATORY_CARE_PROVIDER_SITE_OTHER): Payer: Medicare Other | Admitting: Internal Medicine

## 2010-06-05 ENCOUNTER — Encounter: Payer: Self-pay | Admitting: Internal Medicine

## 2010-06-05 VITALS — BP 112/80 | HR 88 | Temp 98.0°F | Ht 71.0 in | Wt 217.0 lb

## 2010-06-05 DIAGNOSIS — F329 Major depressive disorder, single episode, unspecified: Secondary | ICD-10-CM

## 2010-06-05 MED ORDER — NEFAZODONE HCL 200 MG PO TABS: 200.0000 mg | ORAL_TABLET | Freq: Every day | ORAL | Status: DC

## 2010-06-05 NOTE — Progress Notes (Deleted)
  Subjective:    Patient ID: Martin Bailey, male    DOB: [birth year], 70 y.o.   MRN: [id number]  HPI    Review of Systems     Objective:   Physical Exam        Assessment & Plan:

## 2010-06-05 NOTE — Assessment & Plan Note (Signed)
Discussed at length Needs more treatment Double serzone Side efects discussed

## 2010-06-05 NOTE — Progress Notes (Signed)
  Subjective:   Martin Bailey is an 70 y.o. male who presents for evaluation and treatment of depressive symptoms.  Onset approximately 1 month ago, stable since that time.  He started serzone---"black cloud" has not recurred but has had trouble with continued depression. No suicidal thoughts Tolerating meds---maybe minimal diarrhea  Past Medical History  Diagnosis Date  . COLONIC POLYPS, HX OF 03/04/2007  . DEPRESSION 03/04/2007  . DERMATITIS, SEBORRHEIC 07/19/2007  . FIBRILLATION, ATRIAL 03/04/2007  . HYPERLIPIDEMIA 03/04/2007  . HYPERTENSION 03/04/2007   Past Surgical History  Procedure Date  . Tonsillectomy   . Radiofrequency ablation     x2 for fib    reports that he has quit smoking. He does not have any smokeless tobacco history on file. He reports that he drinks alcohol. His drug history not on file. family history includes Dementia in his mother; Lung cancer in his father; and Osteoporosis in his mother. No Known Allergies No Known Allergies   Review of Systems   patient denies chest pain, shortness of breath, orthopnea. Denies lower extremity edema, abdominal pain, change in appetite, change in bowel movements. Patient denies rashes, musculoskeletal complaints. No other specific complaints in a complete review of systems.    Objective:    well-developed well-nourished male in no acute distress. HEENT exam atraumatic, normocephalic, neck supple without jugular venous distention. Chest clear to auscultation cardiac exam S1-S2 are regular. Abdominal exam overweight with bowel sounds, soft and nontender. Extremities no edema. Neurologic exam is alert with a normal gait.

## 2010-07-09 ENCOUNTER — Ambulatory Visit (INDEPENDENT_AMBULATORY_CARE_PROVIDER_SITE_OTHER): Payer: Medicare Other | Admitting: Internal Medicine

## 2010-07-09 ENCOUNTER — Encounter: Payer: Self-pay | Admitting: Internal Medicine

## 2010-07-09 VITALS — BP 130/84 | HR 80 | Temp 98.1°F | Resp 14 | Wt 216.0 lb

## 2010-07-09 DIAGNOSIS — F329 Major depressive disorder, single episode, unspecified: Secondary | ICD-10-CM

## 2010-07-09 MED ORDER — CITALOPRAM HYDROBROMIDE 20 MG PO TABS: 20.0000 mg | ORAL_TABLET | Freq: Every day | ORAL | Status: DC

## 2010-07-09 NOTE — Assessment & Plan Note (Signed)
Continues to have some significant episodes of depression Will taper off serzone Start celexa---side effects discussed.

## 2010-07-13 NOTE — Progress Notes (Signed)
  Subjective:   Martin Bailey is an 70 y.o. male who presents for f/u of depressive symptoms.  Reviewed previous note He started serzone---"black cloud" has not recurred but has had trouble with continued depression. No suicidal thoughts. He increased dose of serzone. Really not feeling better Tolerating meds---maybe minimal diarrhea  Past Medical History  Diagnosis Date  . COLONIC POLYPS, HX OF 03/04/2007  . DEPRESSION 03/04/2007  . DERMATITIS, SEBORRHEIC 07/19/2007  . FIBRILLATION, ATRIAL 03/04/2007  . HYPERLIPIDEMIA 03/04/2007  . HYPERTENSION 03/04/2007   Past Surgical History  Procedure Date  . Tonsillectomy   . Radiofrequency ablation     x2 for fib    reports that he quit smoking about 33 years ago. He does not have any smokeless tobacco history on file. He reports that he drinks alcohol. His drug history not on file. family history includes Dementia in his mother; Lung cancer in his father; and Osteoporosis in his mother. No Known Allergies No Known Allergies   Review of Systems   patient denies chest pain, shortness of breath, orthopnea. Denies lower extremity edema, abdominal pain, change in appetite, change in bowel movements. Patient denies rashes, musculoskeletal complaints. No other specific complaints in a complete review of systems.    Objective:    well-developed well-nourished male in no acute distress. HEENT exam atraumatic, normocephalic, neck supple without jugular venous distention. Chest clear to auscultation cardiac exam S1-S2 are regular. Abdominal exam overweight with bowel sounds, soft and nontender. Extremities no edema. Neurologic exam is alert with a normal gait.

## 2010-08-15 ENCOUNTER — Encounter: Payer: Self-pay | Admitting: Internal Medicine

## 2010-08-15 ENCOUNTER — Ambulatory Visit (INDEPENDENT_AMBULATORY_CARE_PROVIDER_SITE_OTHER): Payer: Medicare Other | Admitting: Internal Medicine

## 2010-08-15 VITALS — BP 130/90 | HR 84 | Temp 97.9°F | Wt 217.0 lb

## 2010-08-15 DIAGNOSIS — F39 Unspecified mood [affective] disorder: Secondary | ICD-10-CM

## 2010-08-15 DIAGNOSIS — F329 Major depressive disorder, single episode, unspecified: Secondary | ICD-10-CM

## 2010-08-15 LAB — TESTOSTERONE: Testosterone: 225.2 ng/dL — ABNORMAL LOW (ref 350.00–890.00)

## 2010-08-15 NOTE — Progress Notes (Signed)
  Subjective:    Patient ID: Martin Bailey, male    DOB: 1940/09/27, 70 y.o.   MRN: [id number]  HPI  Mood disorder/lack of motivation. He just isn't interested in the things that typically interest him.  Patient discontinued citalopram secondary to side effects (diarrhea). Past Medical History  Diagnosis Date  . COLONIC POLYPS, HX OF 03/04/2007  . DEPRESSION 03/04/2007  . DERMATITIS, SEBORRHEIC 07/19/2007  . FIBRILLATION, ATRIAL 03/04/2007  . HYPERLIPIDEMIA 03/04/2007  . HYPERTENSION 03/04/2007   Past Surgical History  Procedure Date  . Tonsillectomy   . Radiofrequency ablation     x2 for fib    reports that he quit smoking about 33 years ago. He does not have any smokeless tobacco history on file. He reports that he drinks alcohol. His drug history not on file. family history includes Dementia in his mother; Lung cancer in his father; and Osteoporosis in his mother. No Known Allergies   Review of Systems  patient denies chest pain, shortness of breath, orthopnea. Denies lower extremity edema, abdominal pain, change in appetite, change in bowel movements. Patient denies rashes, musculoskeletal complaints. No other specific complaints in a complete review of systems.      Objective:   Physical Exam  well-developed well-nourished male in no acute distress. HEENT exam atraumatic, normocephalic, neck supple without jugular venous distention. Chest clear to auscultation cardiac exam S1-S2 are regular. Abdominal exam overweight with bowel sounds, soft and nontender. Extremities no edema. Neurologic exam is alert with a normal gait.        Assessment & Plan:

## 2010-08-15 NOTE — Assessment & Plan Note (Signed)
Patient with persistent mood disorder. I wonder whether he has testosterone deficiency. I think I'll check a testosterone level first and then make a plan after the blood tests are evaluated. We will call the patient with the blood work results. Patient understands this course of action.

## 2010-09-02 ENCOUNTER — Encounter: Payer: Self-pay | Admitting: Internal Medicine

## 2010-09-02 ENCOUNTER — Ambulatory Visit (INDEPENDENT_AMBULATORY_CARE_PROVIDER_SITE_OTHER): Payer: Medicare Other | Admitting: Internal Medicine

## 2010-09-02 DIAGNOSIS — E349 Endocrine disorder, unspecified: Secondary | ICD-10-CM | POA: Insufficient documentation

## 2010-09-02 DIAGNOSIS — H61109 Unspecified noninfective disorders of pinna, unspecified ear: Secondary | ICD-10-CM

## 2010-09-02 DIAGNOSIS — E291 Testicular hypofunction: Secondary | ICD-10-CM

## 2010-09-02 LAB — SEDIMENTATION RATE: Sed Rate: 25 mm/hr — ABNORMAL HIGH (ref 0–22)

## 2010-09-02 LAB — FOLLICLE STIMULATING HORMONE: FSH: 4.3 m[IU]/mL (ref 1.4–18.1)

## 2010-09-02 LAB — LUTEINIZING HORMONE: LH: 3.82 m[IU]/mL (ref 1.50–9.30)

## 2010-09-02 MED ORDER — TESTOSTERONE CYPIONATE 200 MG/ML IM SOLN: 200.0000 mg | INTRAMUSCULAR | Status: DC

## 2010-09-02 NOTE — Progress Notes (Signed)
  Subjective:    Patient ID: Martin Bailey, male    DOB: Jul 16, 1940, 70 y.o.   MRN: [id number]  HPI  3 day history of left ear swelling that progressed significantly and then involved right ear. Pt points to Pinna-area. No fever or chills. Sxs are now much better. Right ear is now more involved than left ear . Sxs are much better than 2 days ago.  Past Medical History  Diagnosis Date  . COLONIC POLYPS, HX OF 03/04/2007  . DEPRESSION 03/04/2007  . DERMATITIS, SEBORRHEIC 07/19/2007  . FIBRILLATION, ATRIAL 03/04/2007  . HYPERLIPIDEMIA 03/04/2007  . HYPERTENSION 03/04/2007   Past Surgical History  Procedure Date  . Tonsillectomy   . Radiofrequency ablation     x2 for fib    reports that he quit smoking about 33 years ago. He does not have any smokeless tobacco history on file. He reports that he drinks alcohol. His drug history not on file. family history includes Dementia in his mother; Lung cancer in his father; and Osteoporosis in his mother. No Known Allergies   Review of Systems  patient denies chest pain, shortness of breath, orthopnea. Denies lower extremity edema, abdominal pain, change in appetite, change in bowel movements. Patient denies rashes, musculoskeletal complaints. No other specific complaints in a complete review of systems.      Objective:   Physical Exam  Well-developed well-nourished male in no acute distress. HEENT exam atraumatic, normocephalic, extraocular muscles are intact. Neck is supple. No jugular venous distention no thyromegaly. Chest clear to auscultation without increased work of breathing. Cardiac exam S1 and S2 are regular. Abdominal exam active bowel sounds, soft, nontender. Extremities no edema. Neurologic exam she is alert without any motor sensory deficits. Gait is normal.        Assessment & Plan:

## 2010-09-02 NOTE — Assessment & Plan Note (Signed)
Needs treatment and further evaluation Check labs Start testosterone replacement

## 2010-09-02 NOTE — Assessment & Plan Note (Signed)
i highly doubt relapsing polychondritis Check ESR

## 2010-09-03 ENCOUNTER — Telehealth: Payer: Self-pay | Admitting: *Deleted

## 2010-09-03 NOTE — Telephone Encounter (Signed)
Pt wants to know what the next step is since his labs were normal

## 2010-09-04 NOTE — Telephone Encounter (Signed)
He should start testosterone injections 200 mg per cc 1 cc IM monthly, call in a 10 cc vial no refill

## 2010-09-04 NOTE — Telephone Encounter (Signed)
Pt aware.

## 2010-09-17 ENCOUNTER — Telehealth: Payer: Self-pay | Admitting: Internal Medicine

## 2010-09-17 NOTE — Telephone Encounter (Signed)
[name] called from the ins company stating that [name] testosterone sipionate (spelling?) has been approved effective 09/16/10 through 09/15/2011. Can call her back at her number with any questions.

## 2010-09-18 ENCOUNTER — Ambulatory Visit (INDEPENDENT_AMBULATORY_CARE_PROVIDER_SITE_OTHER): Payer: Medicare Other | Admitting: Internal Medicine

## 2010-09-18 DIAGNOSIS — E291 Testicular hypofunction: Secondary | ICD-10-CM

## 2010-09-18 DIAGNOSIS — E349 Endocrine disorder, unspecified: Secondary | ICD-10-CM

## 2010-09-18 MED ORDER — TESTOSTERONE CYPIONATE 200 MG/ML IM SOLN
200.0000 mg | INTRAMUSCULAR | Status: DC
  Administered 2010-09-18 – 2011-04-08 (×5): 200 mg via INTRAMUSCULAR

## 2010-10-16 ENCOUNTER — Ambulatory Visit (INDEPENDENT_AMBULATORY_CARE_PROVIDER_SITE_OTHER): Payer: Medicare Other | Admitting: Internal Medicine

## 2010-10-16 DIAGNOSIS — E291 Testicular hypofunction: Secondary | ICD-10-CM

## 2010-10-16 MED ORDER — TESTOSTERONE CYPIONATE 100 MG/ML IM SOLN: 200.0000 mg | INTRAMUSCULAR | Status: DC

## 2010-11-11 ENCOUNTER — Ambulatory Visit (INDEPENDENT_AMBULATORY_CARE_PROVIDER_SITE_OTHER): Payer: Medicare Other | Admitting: Internal Medicine

## 2010-11-11 DIAGNOSIS — E291 Testicular hypofunction: Secondary | ICD-10-CM

## 2010-11-11 MED ORDER — TESTOSTERONE CYPIONATE 200 MG/ML IM SOLN
200.0000 mg | Freq: Once | INTRAMUSCULAR | Status: AC
  Administered 2010-11-11: 200 mg via INTRAMUSCULAR

## 2010-12-10 ENCOUNTER — Ambulatory Visit: Payer: Medicare Other | Admitting: *Deleted

## 2011-01-07 ENCOUNTER — Ambulatory Visit (INDEPENDENT_AMBULATORY_CARE_PROVIDER_SITE_OTHER): Payer: Medicare Other | Admitting: Internal Medicine

## 2011-01-07 DIAGNOSIS — E349 Endocrine disorder, unspecified: Secondary | ICD-10-CM

## 2011-01-07 DIAGNOSIS — E291 Testicular hypofunction: Secondary | ICD-10-CM

## 2011-02-02 ENCOUNTER — Other Ambulatory Visit: Payer: Self-pay | Admitting: Internal Medicine

## 2011-02-04 ENCOUNTER — Ambulatory Visit (INDEPENDENT_AMBULATORY_CARE_PROVIDER_SITE_OTHER): Payer: Medicare Other | Admitting: *Deleted

## 2011-02-04 DIAGNOSIS — E291 Testicular hypofunction: Secondary | ICD-10-CM

## 2011-02-04 DIAGNOSIS — E349 Endocrine disorder, unspecified: Secondary | ICD-10-CM

## 2011-02-25 ENCOUNTER — Emergency Department (INDEPENDENT_AMBULATORY_CARE_PROVIDER_SITE_OTHER): Payer: Medicare Other

## 2011-02-25 ENCOUNTER — Encounter (HOSPITAL_COMMUNITY): Payer: Self-pay

## 2011-02-25 ENCOUNTER — Telehealth: Payer: Self-pay | Admitting: *Deleted

## 2011-02-25 ENCOUNTER — Emergency Department (HOSPITAL_COMMUNITY)
Admission: EM | Admit: 2011-02-25 | Discharge: 2011-02-25 | Disposition: A | Discharge status: Home / Self Care | Payer: Medicare Other | Source: Home / Self Care | Attending: Emergency Medicine | Admitting: Emergency Medicine

## 2011-02-25 DIAGNOSIS — R05 Cough: Secondary | ICD-10-CM

## 2011-02-25 MED ORDER — PREDNISONE 20 MG PO TABS: 20.0000 mg | ORAL_TABLET | Freq: Every day | ORAL | Status: DC

## 2011-02-25 MED ORDER — HYDROCOD POLST-CHLORPHEN POLST 10-8 MG/5ML PO LQCR: 5.0000 mL | Freq: Two times a day (BID) | ORAL | Status: DC

## 2011-02-25 MED ORDER — PREDNISONE 10 MG PO TABS: 40.0000 mg | ORAL_TABLET | Freq: Every day | ORAL | Status: AC

## 2011-02-25 NOTE — Telephone Encounter (Signed)
Prednisone 20 mg po qd for 5 days tussionex 1 tsp bid prn cough, 120 cc

## 2011-02-25 NOTE — ED Provider Notes (Addendum)
History     CSN: [id number] Arrival date & time: 02/25/2011  4:07 PM   First MD Initiated Contact with Patient 02/25/11 1555      Chief Complaint  Patient presents with  . Cough    (Consider location/radiation/quality/duration/timing/severity/associated sxs/prior treatment) HPI Comments: Have had this coughing spells before , have gone to other clinics and get a shot and that is it" (Dont of what)< been coughing a lot more at night sometimes feels like it making me almost faint and my abdomen feels sore from coughing so much"  No SOB, No nasal congestion, No sore throat"  Its just the cough, it has happened before, I don't smoke since 1979"  "Why don't you just give me  Shot or something" Not making a big deal out of it, i have done well before"  Patient is a 70 y.o. male presenting with cough. The history is provided by the patient.  Cough This is a new problem. Episode onset: x about 3 weeks. The problem occurs constantly. The problem has been gradually worsening. The cough is non-productive. There has been no fever. Pertinent negatives include no chest pain, no chills, no sweats, no ear congestion, no ear pain, no headaches, no rhinorrhea, no sore throat, no myalgias, no shortness of breath and no wheezing. He has tried decongestants for the symptoms. The treatment provided no relief. He is not a smoker. His past medical history does not include bronchitis, pneumonia, bronchiectasis, COPD, emphysema or asthma.    Past Medical History  Diagnosis Date  . COLONIC POLYPS, HX OF 03/04/2007  . DEPRESSION 03/04/2007  . DERMATITIS, SEBORRHEIC 07/19/2007  . FIBRILLATION, ATRIAL 03/04/2007  . HYPERLIPIDEMIA 03/04/2007  . HYPERTENSION 03/04/2007    Past Surgical History  Procedure Date  . Tonsillectomy   . Radiofrequency ablation     x2 for fib    Family History  Problem Relation Age of Onset  . Osteoporosis Mother   . Dementia Mother   . Lung cancer Father     History    Substance Use Topics  . Smoking status: Former Smoker    Quit date: 07/08/1977  . Smokeless tobacco: Not on file  . Alcohol Use: Yes      Review of Systems  Constitutional: Negative for chills.  HENT: Negative for ear pain, sore throat and rhinorrhea.   Respiratory: Positive for cough. Negative for shortness of breath and wheezing.   Cardiovascular: Negative for chest pain.  Musculoskeletal: Negative for myalgias.  Neurological: Negative for headaches.    Allergies  Review of patient's allergies indicates no known allergies.  Home Medications   Current Outpatient Rx  Name Route Sig Dispense Refill  . ATENOLOL 50 MG PO TABS  TAKE 1 TABLET TWICE A DAY 180 tablet 3  . DABIGATRAN ETEXILATE MESYLATE 150 MG PO CAPS Oral Take 150 mg by mouth every 12 (twelve) hours.      . SELENIUM SULF-PYRITHIONE-UREA 2.25 % EX SHAM Apply externally Apply topically 3 (three) times a week.      . TRIAMCINOLONE ACETONIDE 0.5 % EX CREA Topical Apply topically 2 (two) times daily as needed.      Marland Kitchen HYDROCOD POLST-CHLORPHEN POLST 10-8 MG/5ML PO LQCR Oral Take 5 mLs by mouth every 12 (twelve) hours. 140 mL 0  . HYDROCOD POLST-CHLORPHEN POLST 10-8 MG/5ML PO LQCR Oral Take 5 mLs by mouth every 12 (twelve) hours. 120 mL 0  . CITALOPRAM HYDROBROMIDE 20 MG PO TABS Oral Take 1 tablet by mouth Daily.    Marland Kitchen  FLUTICASONE PROPIONATE 50 MCG/ACT NA SUSP Nasal 2 sprays by Nasal route daily.      Marland Kitchen PREDNISONE 10 MG PO TABS Oral Take 4 tablets (40 mg total) by mouth daily. 10 tablet 0  . PREDNISONE 20 MG PO TABS Oral Take 1 tablet (20 mg total) by mouth daily. 5 tablet 0    BP 149/96  Pulse 88  Temp(Src) 97.8 F (36.6 C) (Oral)  Resp 20  SpO2 98%  Physical Exam  Nursing note and vitals reviewed. Constitutional: He appears well-developed and well-nourished. No distress.  HENT:  Head: Normocephalic.  Eyes: Conjunctivae are normal. No scleral icterus.  Neck: Trachea normal and normal range of motion. No JVD  present.  Cardiovascular: Normal rate.   No murmur heard. Pulmonary/Chest: Effort normal and breath sounds normal. Not tachypneic. No respiratory distress. He has no decreased breath sounds. He has no wheezes. He has no rales. He exhibits no tenderness.  Lymphadenopathy:    He has no cervical adenopathy.  Neurological: He is alert. No cranial nerve deficit or sensory deficit.  Skin: Skin is warm.    ED Course  Procedures (including critical care time)  Labs Reviewed - No data to display Dg Chest 2 View  02/25/2011  *RADIOLOGY REPORT*  Clinical Data: Cough.  CHEST - 2 VIEW  Comparison: None.  Findings: Heart size is at the upper limits of normal.  Both lungs are clear.  No evidence of pleural effusion.  No mass or lymphadenopathy identified.  IMPRESSION: No active cardiopulmonary disease.  Original Report Authenticated By: Danae Orleans, M.D.     1. Cough       MDM  Cough x 3 weeks- Denies any other associated symptoms or recent respiratory process        Jimmie Molly, MD 02/25/11 2213  Jimmie Molly, MD 02/25/11 2214

## 2011-02-25 NOTE — Telephone Encounter (Signed)
Patient is calling because he has had a cough  For 9 days.  Productive cough, no drainage, no fever.  Patient has tried OTC with no relief.  Any suggestions? [employer]

## 2011-02-25 NOTE — ED Notes (Signed)
Cough for several days w/o resolution; prior onset felt like this, "bronchiitis"

## 2011-02-26 ENCOUNTER — Ambulatory Visit: Payer: Medicare Other | Admitting: Family Medicine

## 2011-02-27 ENCOUNTER — Telehealth (HOSPITAL_COMMUNITY): Payer: Self-pay | Admitting: *Deleted

## 2011-02-27 NOTE — ED Notes (Signed)
02/26/11  Fax received 11/28 @ 1951 from unknown pharmacy for Rx. of Prednisone 10 mg. 4 tablets ( 40 mg.) by mouth daily # 10 x 5 days. Discussed with Dr. Juanetta Gosling and he said it should be #20.  Pt.'s name and pharmacy name cut of fax. Able to look up pt.'s record from medical record number.  Pt 's phone number obtained and Lubertha South RN to call pt. She asked me to document call tomorrow. Vassie Moselle 02/27/2011

## 2011-03-04 ENCOUNTER — Telehealth: Payer: Self-pay | Admitting: Internal Medicine

## 2011-03-04 ENCOUNTER — Encounter: Payer: Self-pay | Admitting: Internal Medicine

## 2011-03-04 ENCOUNTER — Ambulatory Visit (INDEPENDENT_AMBULATORY_CARE_PROVIDER_SITE_OTHER): Payer: Medicare Other | Admitting: Internal Medicine

## 2011-03-04 DIAGNOSIS — J40 Bronchitis, not specified as acute or chronic: Secondary | ICD-10-CM

## 2011-03-04 DIAGNOSIS — I10 Essential (primary) hypertension: Secondary | ICD-10-CM

## 2011-03-04 MED ORDER — AZITHROMYCIN 250 MG PO TABS: ORAL_TABLET | ORAL | Status: AC

## 2011-03-04 MED ORDER — DOXYCYCLINE HYCLATE 100 MG PO TABS: 100.0000 mg | ORAL_TABLET | Freq: Two times a day (BID) | ORAL | Status: DC

## 2011-03-04 NOTE — Assessment & Plan Note (Signed)
70 year old white male with cough x2 weeks. Faint wheezing on exam consistent with bronchitis. He has failed  course of prednisone. Treat with azithromycin 500 mg daily x3 days.  Gargle with warm salt water and use nasal saline.  Patient advised to call office if symptoms persist or worsen.

## 2011-03-04 NOTE — Patient Instructions (Signed)
Gargle with warm salt water 3 x daily  Use nasal saline spray 2 times daily

## 2011-03-04 NOTE — Telephone Encounter (Signed)
Opened in error. Pt coming in for ov instead.  °

## 2011-03-04 NOTE — Assessment & Plan Note (Signed)
Blood pressure sporadically elevated today. He reports drinking 3 cups of coffee this morning. Patient advised to monitor at home and followup with Dr. Cato Mulligan if persistent elevation. BP: 168/102 mmHg

## 2011-03-04 NOTE — Progress Notes (Signed)
Subjective:    Patient ID: Martin Bailey, male    DOB: May 16, 1940, 70 y.o.   MRN: [id number]  Cough This is a chronic problem. The current episode started 1 to 4 weeks ago. The problem has been unchanged. The problem occurs every few minutes. The cough is non-productive. Associated symptoms include a sore throat. Pertinent negatives include no chills, ear congestion, fever or shortness of breath. He has tried oral steroids and prescription cough suppressant for the symptoms. The treatment provided no relief.   Patient was seen at urgent care last week and prescribed cough medication and prednisone. 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   He plays the trombone and cough is very disruptive.   Review of Systems  Constitutional: Negative for fever and chills.  HENT: Positive for sore throat.   Respiratory: Positive for cough. Negative for shortness of breath.        Past Medical History  Diagnosis Date  . COLONIC POLYPS, HX OF 03/04/2007  . DEPRESSION 03/04/2007  . DERMATITIS, SEBORRHEIC 07/19/2007  . FIBRILLATION, ATRIAL 03/04/2007  . HYPERLIPIDEMIA 03/04/2007  . HYPERTENSION 03/04/2007    History   Social History  . Marital Status: Single    Spouse Name: N/A    Number of Children: N/A  . Years of Education: N/A   Occupational History  . Not on file.   Social History Main Topics  . Smoking status: Former Smoker    Quit date: 07/08/1977  . Smokeless tobacco: Not on file  . Alcohol Use: Yes  . Drug Use: Not on file  . Sexually Active: Not on file   Other Topics Concern  . Not on file   Social History Narrative  . No narrative on file    Past Surgical History  Procedure Date  . Tonsillectomy   . Radiofrequency ablation     x2 for fib    Family History  Problem Relation Age of Onset  . Osteoporosis Mother   . Dementia Mother   . Lung cancer Father     No Known Allergies  Current Outpatient Prescriptions on File  Prior to Visit  Medication Sig Dispense Refill  . atenolol (TENORMIN) 50 MG tablet TAKE 1 TABLET TWICE A DAY  180 tablet  3  . citalopram (CELEXA) 20 MG tablet Take 1 tablet by mouth Daily.      . dabigatran (PRADAXA) 150 MG CAPS Take 150 mg by mouth every 12 (twelve) hours.        . fluticasone (FLONASE) 50 MCG/ACT nasal spray 2 sprays by Nasal route daily.        . Selenium Sulf-Pyrithione-Urea (SELENOS) 2.25 % SHAM Apply topically 3 (three) times a week.        . triamcinolone (KENALOG) 0.5 % cream Apply topically 2 (two) times daily as needed.         Current Facility-Administered Medications on File Prior to Visit  Medication Dose Route Frequency Provider Last Rate Last Dose  . testosterone cypionate (DEPOTESTOTERONE CYPIONATE) injection 200 mg  200 mg Intramuscular Q28 days Judie Petit, MD   200 mg at 02/04/11 1039    BP 168/102  Temp(Src) 97.7 F (36.5 C) (Oral)  Wt 220 lb (99.791 kg)    Objective:   Physical Exam  Constitutional: He is oriented to person, place, and time. He appears well-developed and well-nourished.  HENT:  Head: Normocephalic and atraumatic.       Mild oropharyngeal erythema without  signs of postnasal drip  Eyes: Conjunctivae are normal. Pupils are equal, round, and reactive to light.  Neck: Normal range of motion. Neck supple.  Cardiovascular: Normal rate and normal heart sounds.   Pulmonary/Chest: Effort normal and breath sounds normal.       The faint expiratory wheeze left midlung field  Lymphadenopathy:    He has no cervical adenopathy.  Neurological: He is alert and oriented to person, place, and time.  Skin: Skin is warm and dry.  Psychiatric: He has a normal mood and affect. His behavior is normal.       Assessment & Plan:

## 2011-04-08 ENCOUNTER — Ambulatory Visit (INDEPENDENT_AMBULATORY_CARE_PROVIDER_SITE_OTHER): Payer: Medicare Other | Admitting: *Deleted

## 2011-04-08 DIAGNOSIS — E349 Endocrine disorder, unspecified: Secondary | ICD-10-CM

## 2011-04-08 DIAGNOSIS — E291 Testicular hypofunction: Secondary | ICD-10-CM

## 2011-05-15 ENCOUNTER — Ambulatory Visit (INDEPENDENT_AMBULATORY_CARE_PROVIDER_SITE_OTHER): Payer: Medicare Other | Admitting: Internal Medicine

## 2011-05-15 ENCOUNTER — Encounter: Payer: Self-pay | Admitting: Internal Medicine

## 2011-05-15 VITALS — BP 140/98 | HR 80 | Temp 97.9°F | Wt 224.0 lb

## 2011-05-15 DIAGNOSIS — R030 Elevated blood-pressure reading, without diagnosis of hypertension: Secondary | ICD-10-CM

## 2011-05-15 DIAGNOSIS — I4891 Unspecified atrial fibrillation: Secondary | ICD-10-CM

## 2011-05-15 DIAGNOSIS — I10 Essential (primary) hypertension: Secondary | ICD-10-CM

## 2011-05-15 DIAGNOSIS — E785 Hyperlipidemia, unspecified: Secondary | ICD-10-CM

## 2011-05-15 LAB — BASIC METABOLIC PANEL
GFR: 91.25 mL/min (ref >60.00)
Glucose, Bld: 90 mg/dL (ref 70–99)
Potassium: 4.6 mEq/L (ref 3.5–5.1)
Sodium: 139 mEq/L (ref 135–145)

## 2011-05-15 LAB — CBC WITH DIFFERENTIAL/PLATELET
Eosinophils Relative: 2.3 % (ref 0.0–5.0)
MCV: 96.8 fl (ref 78.0–100.0)
Monocytes Absolute: 0.7 10*3/uL (ref 0.1–1.0)
Neutrophils Relative %: 58.8 % (ref 43.0–77.0)
Platelets: 156 10*3/uL (ref 150.0–400.0)
WBC: 8 10*3/uL (ref 4.5–10.5)

## 2011-05-15 LAB — TSH: TSH: 2.46 u[IU]/mL (ref 0.35–5.50)

## 2011-05-15 MED ORDER — OMEPRAZOLE 20 MG PO CPDR: 20.0000 mg | DELAYED_RELEASE_CAPSULE | Freq: Every day | ORAL | Status: DC

## 2011-05-15 MED ORDER — AMLODIPINE BESYLATE 5 MG PO TABS: 5.0000 mg | ORAL_TABLET | Freq: Every day | ORAL | Status: DC

## 2011-05-15 MED ORDER — DABIGATRAN ETEXILATE MESYLATE 150 MG PO CAPS: 150.0000 mg | ORAL_CAPSULE | Freq: Two times a day (BID) | ORAL | Status: DC

## 2011-05-15 NOTE — Assessment & Plan Note (Signed)
Rate controlled and on pradaxa Continue same meds

## 2011-05-15 NOTE — Assessment & Plan Note (Signed)
Lab Results  Component Value Date   CHOL 223* 04/07/2010   CHOL 209* 04/11/2009   CHOL 219* 07/19/2008   Lab Results  Component Value Date   HDL 52.50 04/07/2010   HDL 42.60 04/11/2009   HDL 16.10 07/19/2008   No results found for this basename: [facility]   Lab Results  Component Value Date   TRIG 152.0* 04/07/2010   TRIG 210.0* 04/11/2009   TRIG 116.0 07/19/2008   Lab Results  Component Value Date   CHOLHDL 4 04/07/2010   CHOLHDL 5 04/11/2009   CHOLHDL 5 07/19/2008   Lab Results  Component Value Date   LDLDIRECT 163.7 04/07/2010   LDLDIRECT 138.8 04/11/2009   LDLDIRECT 152.9 07/19/2008   He does not want to take meds

## 2011-05-15 NOTE — Assessment & Plan Note (Signed)
BP Readings from Last 3 Encounters:  05/15/11 140/98  03/04/11 168/102  02/25/11 149/96   He is on atenolol Probably needs additional treatment, given recent cough I'll avoid ACE-i Start amlodipine 5 mg po qd

## 2011-05-15 NOTE — Progress Notes (Signed)
Patient ID: Martin Bailey, male   DOB: [birth year], 71 y.o.   MRN: [id number] Afib---no known symptoms  Reviewed course of "bronchitis"---medications didn't work. Still has some cough  BP-- BP Readings from Last 3 Encounters:  05/15/11 140/98  03/04/11 168/102  02/25/11 149/96  Home BPs: 160s/80s. Bp does improve after exercise. Rarely he notes a normal BP  Past Medical History  Diagnosis Date  . COLONIC POLYPS, HX OF 03/04/2007  . DEPRESSION 03/04/2007  . DERMATITIS, SEBORRHEIC 07/19/2007  . FIBRILLATION, ATRIAL 03/04/2007  . HYPERLIPIDEMIA 03/04/2007  . HYPERTENSION 03/04/2007    History   Social History  . Marital Status: Single    Spouse Name: N/A    Number of Children: N/A  . Years of Education: N/A   Occupational History  . Not on file.   Social History Main Topics  . Smoking status: Former Smoker    Quit date: 07/08/1977  . Smokeless tobacco: Not on file  . Alcohol Use: Yes  . Drug Use: Not on file  . Sexually Active: Not on file   Other Topics Concern  . Not on file   Social History Narrative  . No narrative on file    Past Surgical History  Procedure Date  . Tonsillectomy   . Radiofrequency ablation     x2 for fib    Family History  Problem Relation Age of Onset  . Osteoporosis Mother   . Dementia Mother   . Lung cancer Father     No Known Allergies  Current Outpatient Prescriptions on File Prior to Visit  Medication Sig Dispense Refill  . atenolol (TENORMIN) 50 MG tablet TAKE 1 TABLET TWICE A DAY  180 tablet  3  . dabigatran (PRADAXA) 150 MG CAPS Take 150 mg by mouth every 12 (twelve) hours.        . fluticasone (FLONASE) 50 MCG/ACT nasal spray 2 sprays by Nasal route daily.        . Selenium Sulf-Pyrithione-Urea (SELENOS) 2.25 % SHAM Apply topically 3 (three) times a week.        . triamcinolone (KENALOG) 0.5 % cream Apply topically 2 (two) times daily as needed.         Current Facility-Administered Medications on File Prior to Visit    Medication Dose Route Frequency Provider Last Rate Last Dose  . testosterone cypionate (DEPOTESTOTERONE CYPIONATE) injection 200 mg  200 mg Intramuscular Q28 days Judie Petit, MD   200 mg at 04/08/11 1000     patient denies chest pain, shortness of breath, orthopnea. Denies lower extremity edema, abdominal pain, change in appetite, change in bowel movements. Patient denies rashes, musculoskeletal complaints. No other specific complaints in a complete review of systems.   BP 140/98  Pulse 80  Temp(Src) 97.9 F (36.6 C) (Oral)  Wt 224 lb (101.606 kg)  well-developed well-nourished male in no acute distress. HEENT exam atraumatic, normocephalic, neck supple without jugular venous distention. Chest clear to auscultation cardiac exam S1-S2 are regular. Abdominal exam overweight with bowel sounds, soft and nontender. Extremities no edema. Neurologic exam is alert with a normal gait.

## 2011-05-15 NOTE — Assessment & Plan Note (Signed)
Discussed BP Reviewed BP hx

## 2011-07-03 ENCOUNTER — Other Ambulatory Visit: Payer: Self-pay | Admitting: Orthopedic Surgery

## 2011-07-03 DIAGNOSIS — M25511 Pain in right shoulder: Secondary | ICD-10-CM

## 2011-07-04 ENCOUNTER — Ambulatory Visit
Admission: RE | Admit: 2011-07-04 | Discharge: 2011-07-04 | Disposition: A | Discharge status: Home / Self Care | Payer: Medicare Other | Source: Ambulatory Visit | Attending: Orthopedic Surgery | Admitting: Orthopedic Surgery

## 2011-07-04 DIAGNOSIS — M25511 Pain in right shoulder: Secondary | ICD-10-CM

## 2011-07-07 ENCOUNTER — Other Ambulatory Visit: Payer: Medicare Other

## 2011-11-25 ENCOUNTER — Other Ambulatory Visit (INDEPENDENT_AMBULATORY_CARE_PROVIDER_SITE_OTHER): Payer: Medicare Other

## 2011-11-25 DIAGNOSIS — Z0000 Encounter for general adult medical examination without abnormal findings: Secondary | ICD-10-CM

## 2011-11-25 DIAGNOSIS — Z125 Encounter for screening for malignant neoplasm of prostate: Secondary | ICD-10-CM

## 2011-11-25 DIAGNOSIS — I10 Essential (primary) hypertension: Secondary | ICD-10-CM

## 2011-11-25 DIAGNOSIS — E785 Hyperlipidemia, unspecified: Secondary | ICD-10-CM

## 2011-11-25 LAB — POCT URINALYSIS DIPSTICK
Bilirubin, UA: NEGATIVE
Blood, UA: NEGATIVE
Nitrite, UA: NEGATIVE
Spec Grav, UA: <=1.005
Urobilinogen, UA: 0.2
pH, UA: 7

## 2011-11-25 LAB — CBC WITH DIFFERENTIAL/PLATELET
Basophils Relative: 0.4 % (ref 0.0–3.0)
Eosinophils Relative: 2 % (ref 0.0–5.0)
HCT: 43.6 % (ref 39.0–52.0)
Hemoglobin: 14.5 g/dL (ref 13.0–17.0)
Lymphs Abs: 2.6 10*3/uL (ref 0.7–4.0)
MCV: 96.9 fl (ref 78.0–100.0)
Monocytes Absolute: 0.8 10*3/uL (ref 0.1–1.0)
RBC: 4.5 Mil/uL (ref 4.22–5.81)
WBC: 8.4 10*3/uL (ref 4.5–10.5)

## 2011-11-25 LAB — BASIC METABOLIC PANEL
BUN: 14 mg/dL (ref 6–23)
Chloride: 102 mEq/L (ref 96–112)
Potassium: 5.1 mEq/L (ref 3.5–5.1)
Sodium: 138 mEq/L (ref 135–145)

## 2011-11-25 LAB — PSA: PSA: 0.44 ng/mL (ref 0.10–4.00)

## 2011-11-25 LAB — TSH: TSH: 4.16 u[IU]/mL (ref 0.35–5.50)

## 2011-11-25 LAB — LIPID PANEL
HDL: 42 mg/dL (ref >39.00)
LDL Cholesterol: 98 mg/dL (ref 0–99)
Total CHOL/HDL Ratio: 4

## 2011-11-25 LAB — HEPATIC FUNCTION PANEL
ALT: 22 U/L (ref 0–53)
AST: 24 U/L (ref 0–37)
Total Protein: 6.8 g/dL (ref 6.0–8.3)

## 2011-12-22 ENCOUNTER — Ambulatory Visit (INDEPENDENT_AMBULATORY_CARE_PROVIDER_SITE_OTHER): Payer: Medicare Other | Admitting: Internal Medicine

## 2011-12-22 ENCOUNTER — Encounter: Payer: Medicare Other | Admitting: Internal Medicine

## 2011-12-22 ENCOUNTER — Encounter: Payer: Self-pay | Admitting: Internal Medicine

## 2011-12-22 VITALS — BP 122/88 | HR 76 | Temp 97.9°F | Ht 71.0 in | Wt 224.0 lb

## 2011-12-22 DIAGNOSIS — Z0000 Encounter for general adult medical examination without abnormal findings: Secondary | ICD-10-CM

## 2011-12-22 DIAGNOSIS — Z23 Encounter for immunization: Secondary | ICD-10-CM

## 2011-12-22 DIAGNOSIS — I4891 Unspecified atrial fibrillation: Secondary | ICD-10-CM

## 2011-12-22 NOTE — Progress Notes (Signed)
cpx  Past Medical History  Diagnosis Date  . COLONIC POLYPS, HX OF 03/04/2007  . DEPRESSION 03/04/2007  . DERMATITIS, SEBORRHEIC 07/19/2007  . FIBRILLATION, ATRIAL 03/04/2007  . HYPERLIPIDEMIA 03/04/2007  . HYPERTENSION 03/04/2007    History   Social History  . Marital Status: Single    Spouse Name: N/A    Number of Children: N/A  . Years of Education: N/A   Occupational History  . Not on file.   Social History Main Topics  . Smoking status: Former Smoker    Quit date: 07/08/1977  . Smokeless tobacco: Not on file  . Alcohol Use: Yes  . Drug Use: Not on file  . Sexually Active: Not on file   Other Topics Concern  . Not on file   Social History Narrative  . No narrative on file    Past Surgical History  Procedure Date  . Tonsillectomy   . Radiofrequency ablation     x2 for fib    Family History  Problem Relation Age of Onset  . Osteoporosis Mother   . Dementia Mother   . Lung cancer Father     No Known Allergies  Current Outpatient Prescriptions on File Prior to Visit  Medication Sig Dispense Refill  . amLODipine (NORVASC) 5 MG tablet Take 1 tablet (5 mg total) by mouth daily.  90 tablet  3  . atenolol (TENORMIN) 50 MG tablet TAKE 1 TABLET TWICE A DAY  180 tablet  3  . dabigatran (PRADAXA) 150 MG CAPS Take 1 capsule (150 mg total) by mouth every 12 (twelve) hours.  180 capsule  3  . fluticasone (FLONASE) 50 MCG/ACT nasal spray 2 sprays by Nasal route daily.        . Selenium Sulf-Pyrithione-Urea (SELENOS) 2.25 % SHAM Apply topically 3 (three) times a week.        . triamcinolone (KENALOG) 0.5 % cream Apply topically 2 (two) times daily as needed.        Marland Kitchen DISCONTD: omeprazole (PRILOSEC) 20 MG capsule Take 1 capsule (20 mg total) by mouth daily.  30 capsule  1   Current Facility-Administered Medications on File Prior to Visit  Medication Dose Route Frequency Provider Last Rate Last Dose  . DISCONTD: testosterone cypionate (DEPOTESTOTERONE CYPIONATE) injection  200 mg  200 mg Intramuscular Q28 days Lindley Magnus, MD   200 mg at 04/08/11 1000     patient denies chest pain, shortness of breath, orthopnea. Denies lower extremity edema, abdominal pain, change in appetite, change in bowel movements. Patient denies rashes, musculoskeletal complaints. No other specific complaints in a complete review of systems.   BP 122/88  Pulse 76  Temp 97.9 F (36.6 C) (Oral)  Ht 5\' 11"  (1.803 m)  Wt 224 lb (101.606 kg)  BMI 31.24 kg/m2 Well-developed male in no acute distress. HEENT exam atraumatic, normocephalic, extraocular muscles are intact. Conjunctivae are pink without exudate. Neck is supple without lymphadenopathy, thyromegaly, jugular venous distention. Chest is clear to auscultation without increased work of breathing. Cardiac exam S1-S2 are irregular. The PMI is normal. No significant murmurs or gallops. Abdominal exam active bowel sounds, soft, nontender. No abdominal bruits. Extremities no clubbing cyanosis or edema. Peripheral pulses are normal without bruits. Neurologic exam alert and oriented without any motor or sensory deficits.  Well visit: Health maint UTD

## 2011-12-23 ENCOUNTER — Encounter: Payer: Self-pay | Admitting: Internal Medicine

## 2012-02-16 ENCOUNTER — Other Ambulatory Visit: Payer: Self-pay | Admitting: *Deleted

## 2012-02-16 MED ORDER — ATENOLOL 50 MG PO TABS: 50.0000 mg | ORAL_TABLET | Freq: Two times a day (BID) | ORAL | Status: DC

## 2012-04-11 ENCOUNTER — Encounter: Payer: Self-pay | Admitting: Gastroenterology

## 2012-05-09 ENCOUNTER — Other Ambulatory Visit: Payer: Self-pay | Admitting: *Deleted

## 2012-05-09 DIAGNOSIS — I10 Essential (primary) hypertension: Secondary | ICD-10-CM

## 2012-05-09 MED ORDER — AMLODIPINE BESYLATE 5 MG PO TABS: 5.0000 mg | ORAL_TABLET | Freq: Every day | ORAL | Status: DC

## 2012-09-07 ENCOUNTER — Ambulatory Visit: Payer: Medicare Other | Admitting: Internal Medicine

## 2012-09-23 ENCOUNTER — Encounter: Payer: Self-pay | Admitting: Gastroenterology

## 2012-10-12 ENCOUNTER — Encounter: Payer: Self-pay | Admitting: Internal Medicine

## 2012-10-12 ENCOUNTER — Ambulatory Visit (INDEPENDENT_AMBULATORY_CARE_PROVIDER_SITE_OTHER): Payer: Self-pay | Admitting: Internal Medicine

## 2012-10-12 VITALS — BP 132/86 | HR 76 | Temp 98.0°F | Wt 224.0 lb

## 2012-10-12 DIAGNOSIS — I10 Essential (primary) hypertension: Secondary | ICD-10-CM

## 2012-10-12 DIAGNOSIS — I4891 Unspecified atrial fibrillation: Secondary | ICD-10-CM

## 2012-10-12 LAB — CBC WITH DIFFERENTIAL/PLATELET
Basophils Relative: 0.5 % (ref 0.0–3.0)
Eosinophils Absolute: 0.2 10*3/uL (ref 0.0–0.7)
Eosinophils Relative: 2.8 % (ref 0.0–5.0)
Hemoglobin: 14.3 g/dL (ref 13.0–17.0)
Lymphocytes Relative: 24.5 % (ref 12.0–46.0)
MCHC: 34.3 g/dL (ref 30.0–36.0)
Monocytes Relative: 8.4 % (ref 3.0–12.0)
Neutro Abs: 4.3 10*3/uL (ref 1.4–7.7)
Neutrophils Relative %: 63.8 % (ref 43.0–77.0)
RBC: 4.27 Mil/uL (ref 4.22–5.81)
WBC: 6.8 10*3/uL (ref 4.5–10.5)

## 2012-10-12 LAB — BASIC METABOLIC PANEL
CO2: 25 mEq/L (ref 19–32)
Calcium: 9 mg/dL (ref 8.4–10.5)
Sodium: 137 mEq/L (ref 135–145)

## 2012-10-12 NOTE — Progress Notes (Signed)
Patient ID: MACALLAN ORD, male   DOB: 1940/06/09, 72 y.o.   MRN: [id number]  afib- no bleeding complications- no sxs  Lipids-- no meds  gerd- no sxs on ppi  htn- home bps: 110s/70s.  New complaint- left knee pain for one week  Reviewed pmh, psh, sochx, meds   patient denies chest pain, shortness of breath, orthopnea. Denies lower extremity edema, abdominal pain, change in appetite, change in bowel movements. Patient denies rashes, musculoskeletal complaints. No other specific complaints in a complete review of systems.    well-developed well-nourished male in no acute distress. HEENT exam atraumatic, normocephalic, neck supple without jugular venous distention. Chest clear to auscultation cardiac exam S1-S2 are irregular. Abdominal exam overweight with bowel sounds, soft and nontender. Extremities no edema. Neurologic exam is alert with a normal gait.

## 2012-10-13 ENCOUNTER — Ambulatory Visit (INDEPENDENT_AMBULATORY_CARE_PROVIDER_SITE_OTHER): Payer: No Typology Code available for payment source | Admitting: Psychology

## 2012-10-13 DIAGNOSIS — F331 Major depressive disorder, recurrent, moderate: Secondary | ICD-10-CM

## 2012-10-14 NOTE — Assessment & Plan Note (Signed)
Blood pressure is adequately controlled. Continue current medications.

## 2012-10-14 NOTE — Assessment & Plan Note (Signed)
Doing well. Continue Pradaxa. Rate is controlled. He is tolerating dabigatran without difficulty. No bleeding complications. He will call if he has any trouble with Pradaxa (dabigatran).

## 2012-10-24 ENCOUNTER — Ambulatory Visit (INDEPENDENT_AMBULATORY_CARE_PROVIDER_SITE_OTHER): Payer: No Typology Code available for payment source | Admitting: Psychology

## 2012-10-24 DIAGNOSIS — F331 Major depressive disorder, recurrent, moderate: Secondary | ICD-10-CM

## 2012-11-01 ENCOUNTER — Ambulatory Visit (INDEPENDENT_AMBULATORY_CARE_PROVIDER_SITE_OTHER): Payer: No Typology Code available for payment source | Admitting: Psychology

## 2012-11-01 DIAGNOSIS — F331 Major depressive disorder, recurrent, moderate: Secondary | ICD-10-CM

## 2012-11-10 ENCOUNTER — Other Ambulatory Visit: Payer: Self-pay | Admitting: *Deleted

## 2012-11-10 ENCOUNTER — Ambulatory Visit (INDEPENDENT_AMBULATORY_CARE_PROVIDER_SITE_OTHER): Payer: No Typology Code available for payment source | Admitting: Psychology

## 2012-11-10 DIAGNOSIS — F331 Major depressive disorder, recurrent, moderate: Secondary | ICD-10-CM

## 2012-11-10 DIAGNOSIS — I4891 Unspecified atrial fibrillation: Secondary | ICD-10-CM

## 2012-11-10 MED ORDER — DABIGATRAN ETEXILATE MESYLATE 150 MG PO CAPS: 150.0000 mg | ORAL_CAPSULE | Freq: Two times a day (BID) | ORAL | Status: DC

## 2012-11-21 ENCOUNTER — Ambulatory Visit (INDEPENDENT_AMBULATORY_CARE_PROVIDER_SITE_OTHER): Payer: No Typology Code available for payment source | Admitting: Psychology

## 2012-11-21 DIAGNOSIS — F331 Major depressive disorder, recurrent, moderate: Secondary | ICD-10-CM

## 2012-11-30 ENCOUNTER — Ambulatory Visit (INDEPENDENT_AMBULATORY_CARE_PROVIDER_SITE_OTHER): Payer: No Typology Code available for payment source | Admitting: Psychology

## 2012-11-30 DIAGNOSIS — F331 Major depressive disorder, recurrent, moderate: Secondary | ICD-10-CM

## 2012-12-09 ENCOUNTER — Ambulatory Visit (INDEPENDENT_AMBULATORY_CARE_PROVIDER_SITE_OTHER): Payer: No Typology Code available for payment source | Admitting: Psychology

## 2012-12-09 DIAGNOSIS — F331 Major depressive disorder, recurrent, moderate: Secondary | ICD-10-CM

## 2012-12-15 ENCOUNTER — Ambulatory Visit (INDEPENDENT_AMBULATORY_CARE_PROVIDER_SITE_OTHER): Payer: No Typology Code available for payment source | Admitting: Psychology

## 2012-12-15 DIAGNOSIS — F331 Major depressive disorder, recurrent, moderate: Secondary | ICD-10-CM

## 2012-12-21 ENCOUNTER — Ambulatory Visit (INDEPENDENT_AMBULATORY_CARE_PROVIDER_SITE_OTHER): Payer: No Typology Code available for payment source | Admitting: Psychology

## 2012-12-21 DIAGNOSIS — F331 Major depressive disorder, recurrent, moderate: Secondary | ICD-10-CM

## 2012-12-23 ENCOUNTER — Encounter: Payer: Self-pay | Admitting: Family Medicine

## 2012-12-23 ENCOUNTER — Ambulatory Visit (INDEPENDENT_AMBULATORY_CARE_PROVIDER_SITE_OTHER): Payer: Medicare Other | Admitting: Family Medicine

## 2012-12-23 VITALS — BP 120/80 | Temp 98.1°F | Wt 228.0 lb

## 2012-12-23 DIAGNOSIS — H6122 Impacted cerumen, left ear: Secondary | ICD-10-CM

## 2012-12-23 DIAGNOSIS — Z23 Encounter for immunization: Secondary | ICD-10-CM

## 2012-12-23 DIAGNOSIS — H6120 Impacted cerumen, unspecified ear: Secondary | ICD-10-CM

## 2012-12-23 NOTE — Addendum Note (Signed)
Addended by: Terressa Koyanagi on: 12/23/2012 11:21 AM   Modules accepted: Level of Service

## 2012-12-23 NOTE — Progress Notes (Addendum)
Chief Complaint  Patient presents with  . ears clogged    HPI:  Acute visit for clogged ears: -has frequent wax in ears -ears have felt clogged for a few days -has tried peroxide and R feels better but L still clogged, comes in for flushes with his PCP from time to time per his report -denies: hearing loss, pain, drainage from ear, no UR symptoms  ROS: See pertinent positives and negatives per HPI.  Past Medical History  Diagnosis Date  . COLONIC POLYPS, HX OF 03/04/2007  . DEPRESSION 03/04/2007  . DERMATITIS, SEBORRHEIC 07/19/2007  . FIBRILLATION, ATRIAL 03/04/2007  . HYPERLIPIDEMIA 03/04/2007  . HYPERTENSION 03/04/2007    Past Surgical History  Procedure Laterality Date  . Tonsillectomy    . Radiofrequency ablation      x2 for fib    Family History  Problem Relation Age of Onset  . Osteoporosis Mother   . Dementia Mother   . Lung cancer Father     History   Social History  . Marital Status: Single    Spouse Name: N/A    Number of Children: N/A  . Years of Education: N/A   Social History Main Topics  . Smoking status: Former Smoker    Quit date: 07/08/1977  . Smokeless tobacco: None  . Alcohol Use: Yes  . Drug Use: None  . Sexual Activity: None   Other Topics Concern  . None   Social History Narrative  . None    Current outpatient prescriptions:amLODipine (NORVASC) 5 MG tablet, Take 1 tablet (5 mg total) by mouth daily., Disp: 90 tablet, Rfl: 3;  atenolol (TENORMIN) 50 MG tablet, Take 1 tablet (50 mg total) by mouth 2 (two) times daily., Disp: 180 tablet, Rfl: 3;  dabigatran (PRADAXA) 150 MG CAPS capsule, Take 1 capsule (150 mg total) by mouth every 12 (twelve) hours., Disp: 180 capsule, Rfl: 0 fluticasone (FLONASE) 50 MCG/ACT nasal spray, 2 sprays by Nasal route daily.  , Disp: , Rfl: ;  Selenium Sulf-Pyrithione-Urea (SELENOS) 2.25 % SHAM, Apply topically 3 (three) times a week.  , Disp: , Rfl: ;  triamcinolone (KENALOG) 0.5 % cream, Apply topically 2 (two)  times daily as needed.  , Disp: , Rfl: ;  omeprazole (PRILOSEC) 20 MG capsule, Take 20 mg by mouth daily., Disp: , Rfl:   EXAM:  Filed Vitals:   12/23/12 1048  BP: 120/80  Temp: 98.1 F (36.7 C)    Body mass index is 31.81 kg/(m^2).  GENERAL: vitals reviewed and listed above, alert, oriented, appears well hydrated and in no acute distress  HEENT: atraumatic, conjunttiva clear, no obvious abnormalities on inspection of external nose and ears, normal appearance of ear canals and TMs except for soft cerumen in L ear canal, nasal passages  Normal, no post oropharyngeal erythema , no tonsillar edema or exudate, no sinus TTP  NECK: no obvious masses on inspection  MS: moves all extremities without noticeable abnormality  PSYCH: pleasant and cooperative, no obvious depression or anxiety  ASSESSMENT AND PLAN:  Discussed the following assessment and plan:  Cerumen impaction, left  -lavage by staff after discussion risks/benefits with improvement in symptoms -small amount of soft wax remained in outer canal which was removed with soft currette -Patient advised to return or notify a doctor immediately if symptoms worsen or persist or new concerns arise.  There are no Patient Instructions on file for this visit.   Kriste Basque R.

## 2012-12-23 NOTE — Addendum Note (Signed)
Addended by: Azucena Freed on: 12/23/2012 01:04 PM   Modules accepted: Orders

## 2012-12-27 ENCOUNTER — Encounter: Payer: Self-pay | Admitting: Family

## 2012-12-27 ENCOUNTER — Ambulatory Visit (INDEPENDENT_AMBULATORY_CARE_PROVIDER_SITE_OTHER): Payer: Medicare Other | Admitting: Family

## 2012-12-27 VITALS — BP 120/80 | HR 73 | Wt 225.0 lb

## 2012-12-27 DIAGNOSIS — H9209 Otalgia, unspecified ear: Secondary | ICD-10-CM

## 2012-12-27 DIAGNOSIS — H9202 Otalgia, left ear: Secondary | ICD-10-CM

## 2012-12-27 DIAGNOSIS — H60392 Other infective otitis externa, left ear: Secondary | ICD-10-CM

## 2012-12-27 DIAGNOSIS — H60399 Other infective otitis externa, unspecified ear: Secondary | ICD-10-CM

## 2012-12-27 MED ORDER — NEOMYCIN-COLIST-HC-THONZONIUM 3.3-3-10-0.5 MG/ML OT SUSP: 3.0000 [drp] | Freq: Four times a day (QID) | OTIC | Status: DC

## 2012-12-27 NOTE — Patient Instructions (Addendum)
Otitis Externa Otitis externa is a bacterial or fungal infection of the outer ear canal. This is the area from the eardrum to the outside of the ear. Otitis externa is sometimes called "swimmer's ear." CAUSES  Possible causes of infection include:  Swimming in dirty water.  Moisture remaining in the ear after swimming or bathing.  Mild injury (trauma) to the ear.  Objects stuck in the ear (foreign body).  Cuts or scrapes (abrasions) on the outside of the ear. SYMPTOMS  The first symptom of infection is often itching in the ear canal. Later signs and symptoms may include swelling and redness of the ear canal, ear pain, and yellowish-white fluid (pus) coming from the ear. The ear pain may be worse when pulling on the earlobe. DIAGNOSIS  Your caregiver will perform a physical exam. A sample of fluid may be taken from the ear and examined for bacteria or fungi. TREATMENT  Antibiotic ear drops are often given for 10 to 14 days. Treatment may also include pain medicine or corticosteroids to reduce itching and swelling. PREVENTION   Keep your ear dry. Use the corner of a towel to absorb water out of the ear canal after swimming or bathing.  Avoid scratching or putting objects inside your ear. This can damage the ear canal or remove the protective wax that lines the canal. This makes it easier for bacteria and fungi to grow.  Avoid swimming in lakes, polluted water, or poorly chlorinated pools.  You may use ear drops made of rubbing alcohol and vinegar after swimming. Combine equal parts of white vinegar and alcohol in a bottle. Put 3 or 4 drops into each ear after swimming. HOME CARE INSTRUCTIONS   Apply antibiotic ear drops to the ear canal as prescribed by your caregiver.  Only take over-the-counter or prescription medicines for pain, discomfort, or fever as directed by your caregiver.  If you have diabetes, follow any additional treatment instructions from your caregiver.  Keep all  follow-up appointments as directed by your caregiver. SEEK MEDICAL CARE IF:   You have a fever.  Your ear is still red, swollen, painful, or draining pus after 3 days.  Your redness, swelling, or pain gets worse.  You have a severe headache.  You have redness, swelling, pain, or tenderness in the area behind your ear. MAKE SURE YOU:   Understand these instructions.  Will watch your condition.  Will get help right away if you are not doing well or get worse. Document Released: 03/16/2005 Document Revised: 06/08/2011 Document Reviewed: 04/02/2011 [employer] Patient Information 2014 [employer].  

## 2012-12-27 NOTE — Progress Notes (Signed)
Subjective:    Patient ID: Martin Bailey, male    DOB: 05/01/1940, 72 y.o.   MRN: [id number]  HPI 72 year old white male, nonsmoker, patient of Dr. Cato Mulligan in today with complaints of feeling like his left ear is clogged up. He was seen by Dr. Selena Batten several days ago for cerumen impaction and expelled a large amount of wax. Since that time, he reports he has continued to have difficulty hearing out of his left ear with popping and cracking sounds. It is tender to touch. No drainage or discharge.   Review of Systems  Constitutional: Negative.   HENT: Positive for ear pain. Negative for congestion, rhinorrhea and postnasal drip.   Respiratory: Negative.   Cardiovascular: Negative.   Musculoskeletal: Negative.   Skin: Negative.   Allergic/Immunologic: Negative.   Neurological: Negative.   Psychiatric/Behavioral: Negative.    Past Medical History  Diagnosis Date  . COLONIC POLYPS, HX OF 03/04/2007  . DEPRESSION 03/04/2007  . DERMATITIS, SEBORRHEIC 07/19/2007  . FIBRILLATION, ATRIAL 03/04/2007  . HYPERLIPIDEMIA 03/04/2007  . HYPERTENSION 03/04/2007    History   Social History  . Marital Status: Single    Spouse Name: N/A    Number of Children: N/A  . Years of Education: N/A   Occupational History  . Not on file.   Social History Main Topics  . Smoking status: Former Smoker    Quit date: 07/08/1977  . Smokeless tobacco: Not on file  . Alcohol Use: Yes  . Drug Use: Not on file  . Sexual Activity: Not on file   Other Topics Concern  . Not on file   Social History Narrative  . No narrative on file    Past Surgical History  Procedure Laterality Date  . Tonsillectomy    . Radiofrequency ablation      x2 for fib    Family History  Problem Relation Age of Onset  . Osteoporosis Mother   . Dementia Mother   . Lung cancer Father     No Known Allergies  Current Outpatient Prescriptions on File Prior to Visit  Medication Sig Dispense Refill  . amLODipine (NORVASC) 5 MG  tablet Take 1 tablet (5 mg total) by mouth daily.  90 tablet  3  . atenolol (TENORMIN) 50 MG tablet Take 1 tablet (50 mg total) by mouth 2 (two) times daily.  180 tablet  3  . dabigatran (PRADAXA) 150 MG CAPS capsule Take 1 capsule (150 mg total) by mouth every 12 (twelve) hours.  180 capsule  0  . fluticasone (FLONASE) 50 MCG/ACT nasal spray 2 sprays by Nasal route daily.        . Selenium Sulf-Pyrithione-Urea (SELENOS) 2.25 % SHAM Apply topically 3 (three) times a week.        . triamcinolone (KENALOG) 0.5 % cream Apply topically 2 (two) times daily as needed.        Marland Kitchen omeprazole (PRILOSEC) 20 MG capsule Take 20 mg by mouth daily.       No current facility-administered medications on file prior to visit.    BP 120/80  Pulse 73  Wt 225 lb (102.059 kg)  BMI 31.39 kg/m2chart    Objective:   Physical Exam  Constitutional: He is oriented to person, place, and time. He appears well-developed and well-nourished.  HENT:  Right Ear: External ear normal.  Nose: Nose normal.  Mouth/Throat: Oropharynx is clear and moist.  Left ear: Small amount of cerumen continuing to impact the canal. Moderate redness inside of the  canal. After lavage, tympanic membrane intact. Mild tragal tenderness  Neck: Normal range of motion. Neck supple.  Cardiovascular: Normal rate, regular rhythm and normal heart sounds.   Pulmonary/Chest: Effort normal and breath sounds normal.  Neurological: He is alert and oriented to person, place, and time.  Skin: Skin is warm and dry.  Psychiatric: He has a normal mood and affect.          Assessment & Plan:  Assessment: 1. Otitis externa 2. Left ear pain  Plan: Cortisporin Otic 3 drops in the left ear 4 times a day x5 days. Call the office if symptoms worsen or persist. Recheck as scheduled, and as needed.

## 2013-01-17 ENCOUNTER — Ambulatory Visit (INDEPENDENT_AMBULATORY_CARE_PROVIDER_SITE_OTHER): Payer: Medicare Other | Admitting: Family Medicine

## 2013-01-17 ENCOUNTER — Encounter: Payer: Self-pay | Admitting: Family Medicine

## 2013-01-17 VITALS — BP 124/80 | HR 83 | Temp 98.2°F | Wt 224.0 lb

## 2013-01-17 DIAGNOSIS — H60399 Other infective otitis externa, unspecified ear: Secondary | ICD-10-CM

## 2013-01-17 DIAGNOSIS — H60392 Other infective otitis externa, left ear: Secondary | ICD-10-CM

## 2013-01-17 NOTE — Progress Notes (Signed)
  Subjective:    Patient ID: Martin Bailey, male    DOB: Jun 17, 1940, 71 y.o.   MRN: [id number]  HPI Here for continued problems with the left ear. He has been here twice in the past 6 weeks for this, and on 12-27-12 he had the ear irrigated clear. He then used Cortisporin Otic drops for 2 weeks, and he felt better. Now in the past week the pain has returned along with a popping or cracking sensation in the left ear. No sinus or nasal congestion.    Review of Systems  Constitutional: Negative.   HENT: Positive for ear pain. Negative for postnasal drip, sinus pressure and sore throat.   Eyes: Negative.   Respiratory: Negative.        Objective:   Physical Exam  Constitutional: He appears well-developed and well-nourished. No distress.  HENT:  Right Ear: External ear normal.  Nose: Nose normal.  Mouth/Throat: Oropharynx is clear and moist.  The left ear canal is blocked by cerumen   Eyes: Conjunctivae are normal.  Lymphadenopathy:    He has no cervical adenopathy.          Assessment & Plan:  We were able to irrigate the cerumen clear with water today, but on re-exam the canal is red and inflamed. The TM looks clear. He likely has a fungal infection of the canal, so we will refer him to ENT

## 2013-03-06 ENCOUNTER — Other Ambulatory Visit: Payer: Self-pay | Admitting: *Deleted

## 2013-03-06 MED ORDER — ATENOLOL 50 MG PO TABS: 50.0000 mg | ORAL_TABLET | Freq: Two times a day (BID) | ORAL | Status: DC

## 2013-04-12 ENCOUNTER — Encounter: Payer: Self-pay | Admitting: Internal Medicine

## 2013-04-12 ENCOUNTER — Ambulatory Visit (INDEPENDENT_AMBULATORY_CARE_PROVIDER_SITE_OTHER): Payer: Medicare Other | Admitting: Internal Medicine

## 2013-04-12 VITALS — BP 124/84 | HR 60 | Temp 98.6°F | Ht 71.0 in | Wt 218.0 lb

## 2013-04-12 DIAGNOSIS — R7309 Other abnormal glucose: Secondary | ICD-10-CM

## 2013-04-12 DIAGNOSIS — R739 Hyperglycemia, unspecified: Secondary | ICD-10-CM

## 2013-04-12 DIAGNOSIS — I4891 Unspecified atrial fibrillation: Secondary | ICD-10-CM

## 2013-04-12 DIAGNOSIS — I10 Essential (primary) hypertension: Secondary | ICD-10-CM

## 2013-04-12 DIAGNOSIS — E785 Hyperlipidemia, unspecified: Secondary | ICD-10-CM

## 2013-04-12 LAB — LIPID PANEL
CHOL/HDL RATIO: 4
Cholesterol: 174 mg/dL (ref 0–200)
HDL: 41.5 mg/dL (ref >39.00)
LDL Cholesterol: 104 mg/dL — ABNORMAL HIGH (ref 0–99)
Triglycerides: 143 mg/dL (ref 0.0–149.0)
VLDL: 28.6 mg/dL (ref 0.0–40.0)

## 2013-04-12 LAB — CBC WITH DIFFERENTIAL/PLATELET
BASOS PCT: 0.7 % (ref 0.0–3.0)
Basophils Absolute: 0.1 10*3/uL (ref 0.0–0.1)
EOS ABS: 0.2 10*3/uL (ref 0.0–0.7)
Eosinophils Relative: 1.9 % (ref 0.0–5.0)
HCT: 42.1 % (ref 39.0–52.0)
HEMOGLOBIN: 14.2 g/dL (ref 13.0–17.0)
LYMPHS PCT: 28.3 % (ref 12.0–46.0)
Lymphs Abs: 2.5 10*3/uL (ref 0.7–4.0)
MCHC: 33.7 g/dL (ref 30.0–36.0)
MCV: 94.8 fl (ref 78.0–100.0)
Monocytes Absolute: 0.9 10*3/uL (ref 0.1–1.0)
Monocytes Relative: 10 % (ref 3.0–12.0)
NEUTROS ABS: 5.1 10*3/uL (ref 1.4–7.7)
Neutrophils Relative %: 59.1 % (ref 43.0–77.0)
Platelets: 304 10*3/uL (ref 150.0–400.0)
RBC: 4.44 Mil/uL (ref 4.22–5.81)
RDW: 12.8 % (ref 11.5–14.6)
WBC: 8.7 10*3/uL (ref 4.5–10.5)

## 2013-04-12 LAB — HEPATIC FUNCTION PANEL
ALK PHOS: 59 U/L (ref 39–117)
ALT: 17 U/L (ref 0–53)
AST: 19 U/L (ref 0–37)
Albumin: 3.8 g/dL (ref 3.5–5.2)
BILIRUBIN DIRECT: 0 mg/dL (ref 0.0–0.3)
BILIRUBIN TOTAL: 0.5 mg/dL (ref 0.3–1.2)
Total Protein: 7.5 g/dL (ref 6.0–8.3)

## 2013-04-12 LAB — BASIC METABOLIC PANEL
BUN: 10 mg/dL (ref 6–23)
CALCIUM: 9.1 mg/dL (ref 8.4–10.5)
CO2: 27 mEq/L (ref 19–32)
Chloride: 103 mEq/L (ref 96–112)
Creatinine, Ser: 1 mg/dL (ref 0.4–1.5)
GFR: 78.3 mL/min (ref >60.00)
Glucose, Bld: 101 mg/dL — ABNORMAL HIGH (ref 70–99)
Potassium: 5.1 mEq/L (ref 3.5–5.1)
SODIUM: 137 meq/L (ref 135–145)

## 2013-04-12 LAB — TSH: TSH: 1.4 u[IU]/mL (ref 0.35–5.50)

## 2013-04-12 LAB — HEMOGLOBIN A1C: HEMOGLOBIN A1C: 5.7 % (ref 4.6–6.5)

## 2013-04-12 MED ORDER — SELENIUM SULFIDE 2.5 % EX LOTN: 1.0000 "application " | TOPICAL_LOTION | Freq: Every day | CUTANEOUS | Status: DC | PRN

## 2013-04-12 MED ORDER — FLUTICASONE PROPIONATE 50 MCG/ACT NA SUSP: 2.0000 | Freq: Every day | NASAL | Status: DC

## 2013-04-12 NOTE — Assessment & Plan Note (Signed)
Check labs No treatment at this time

## 2013-04-12 NOTE — Progress Notes (Signed)
afib- tolerating meds and not having bleeding trouble on dabigatran.  gerd- no sxs on meds  htn- tolerating meds, no home bps  Lipids- no meds at this time  Past Medical History  Diagnosis Date  . COLONIC POLYPS, HX OF 03/04/2007  . DEPRESSION 03/04/2007  . DERMATITIS, SEBORRHEIC 07/19/2007  . FIBRILLATION, ATRIAL 03/04/2007  . HYPERLIPIDEMIA 03/04/2007  . HYPERTENSION 03/04/2007    History   Social History  . Marital Status: Single    Spouse Name: N/A    Number of Children: N/A  . Years of Education: N/A   Occupational History  . Not on file.   Social History Main Topics  . Smoking status: Former Smoker    Quit date: 07/08/1977  . Smokeless tobacco: Never Used  . Alcohol Use: Yes     Comment: occ  . Drug Use: No  . Sexual Activity: Not on file   Other Topics Concern  . Not on file   Social History Narrative  . No narrative on file    Past Surgical History  Procedure Laterality Date  . Tonsillectomy    . Radiofrequency ablation      x2 for fib    Family History  Problem Relation Age of Onset  . Osteoporosis Mother   . Dementia Mother   . Lung cancer Father     No Known Allergies  Current Outpatient Prescriptions on File Prior to Visit  Medication Sig Dispense Refill  . amLODipine (NORVASC) 5 MG tablet Take 1 tablet (5 mg total) by mouth daily.  90 tablet  3  . atenolol (TENORMIN) 50 MG tablet Take 1 tablet (50 mg total) by mouth 2 (two) times daily.  180 tablet  1  . dabigatran (PRADAXA) 150 MG CAPS capsule Take 1 capsule (150 mg total) by mouth every 12 (twelve) hours.  180 capsule  0  . omeprazole (PRILOSEC) 20 MG capsule Take 20 mg by mouth daily.      Marland Kitchen triamcinolone (KENALOG) 0.5 % cream Apply topically 2 (two) times daily as needed.         No current facility-administered medications on file prior to visit.     patient denies chest pain, shortness of breath, orthopnea. Denies lower extremity edema, abdominal pain, change in appetite, change in  bowel movements. Patient denies rashes, musculoskeletal complaints. No other specific complaints in a complete review of systems.   BP 124/84  Pulse 60  Temp(Src) 98.6 F (37 C) (Oral)  Ht 5\' 11"  (1.803 m)  Wt 218 lb (98.884 kg)  BMI 30.42 kg/m2  well-developed well-nourished male in no acute distress. HEENT exam atraumatic, normocephalic, neck supple without jugular venous distention. Chest clear to auscultation cardiac exam S1-S2 are irregular. Abdominal exam overweight with bowel sounds, soft and nontender. Extremities no edema. Neurologic exam is alert with a normal gait.

## 2013-04-12 NOTE — Progress Notes (Signed)
Pre visit review using our clinic review tool, if applicable. No additional management support is needed unless otherwise documented below in the visit note. 

## 2013-04-12 NOTE — Assessment & Plan Note (Signed)
BP Readings from Last 3 Encounters:  04/12/13 124/84  01/17/13 124/80  12/27/12 120/80   Check labs today Continue same meds

## 2013-04-12 NOTE — Assessment & Plan Note (Signed)
Tolerating meds Continue same

## 2013-04-14 ENCOUNTER — Ambulatory Visit: Payer: Medicare Other | Admitting: Internal Medicine

## 2013-04-18 ENCOUNTER — Telehealth: Payer: Self-pay | Admitting: Internal Medicine

## 2013-04-18 DIAGNOSIS — I4891 Unspecified atrial fibrillation: Secondary | ICD-10-CM

## 2013-04-18 MED ORDER — DABIGATRAN ETEXILATE MESYLATE 150 MG PO CAPS: 150.0000 mg | ORAL_CAPSULE | Freq: Two times a day (BID) | ORAL | Status: DC

## 2013-04-18 NOTE — Telephone Encounter (Signed)
[facility] requesting new script for dabigatran (PRADAXA) 150 MG CAPS capsule #90.

## 2013-04-18 NOTE — Telephone Encounter (Signed)
rx sent in electronically 

## 2013-05-03 ENCOUNTER — Telehealth: Payer: Self-pay | Admitting: Internal Medicine

## 2013-05-03 NOTE — Telephone Encounter (Signed)
Relevant patient education assigned to patient using Emmi. ° °

## 2013-07-17 ENCOUNTER — Ambulatory Visit (INDEPENDENT_AMBULATORY_CARE_PROVIDER_SITE_OTHER): Payer: Medicare Other | Admitting: Family Medicine

## 2013-07-17 ENCOUNTER — Encounter: Payer: Self-pay | Admitting: Family Medicine

## 2013-07-17 ENCOUNTER — Telehealth: Payer: Self-pay | Admitting: Internal Medicine

## 2013-07-17 VITALS — BP 120/80 | Temp 98.7°F | Wt 224.0 lb

## 2013-07-17 DIAGNOSIS — H60399 Other infective otitis externa, unspecified ear: Secondary | ICD-10-CM | POA: Insufficient documentation

## 2013-07-17 MED ORDER — CIPROFLOXACIN-HYDROCORTISONE 0.2-1 % OT SUSP: OTIC | Status: DC

## 2013-07-17 NOTE — Telephone Encounter (Signed)
Pt would like the ciprofloxacin-hydrocortisone (CIPRO HC) otic suspension that dr todd rx today. It should be sent to [facility]/ [address] , was sent to [facility]. Can you cancel that order  And resend new one? Thanks!

## 2013-07-17 NOTE — Progress Notes (Signed)
   Subjective:    Patient ID: Martin Bailey, male    DOB: [birth year], 73 y.o.   MRN: [id number]  HPI  Martin Bailey is a 73 year old male who comes in today because of irritation of his left ear canal  He's had this problem off and on for quite a while. He tends to put peroxide in his ears  Review of Systems    review of systems otherwise negative Objective:   Physical Exam  Well-developed and nourished male no acute distress right ear canal normal left ear canal is markedly narrowed with a lot of exudate. He was taken to the treatment room with irrigation and suction all the exudate was removed.      Assessment & Plan:  Otitis externa left ear Cipro drops followup in one month

## 2013-07-17 NOTE — Progress Notes (Signed)
Pre visit review using our clinic review tool, if applicable. No additional management support is needed unless otherwise documented below in the visit note. 

## 2013-07-17 NOTE — Patient Instructions (Addendum)
Cipro otic,,,,,,,,,,, one drop in the her left ear canal at bedtime packed with cotton. In the morning remove the cotton and leave it open to air. No Q-tips ........ no hydrogen peroxide  Return in one month for followup

## 2013-07-17 NOTE — Telephone Encounter (Signed)
Rx sent 

## 2013-08-17 ENCOUNTER — Ambulatory Visit: Payer: Self-pay | Admitting: Family Medicine

## 2013-09-12 ENCOUNTER — Telehealth: Payer: Self-pay | Admitting: Internal Medicine

## 2013-09-12 MED ORDER — ATENOLOL 50 MG PO TABS: 50.0000 mg | ORAL_TABLET | Freq: Two times a day (BID) | ORAL | Status: DC

## 2013-09-12 NOTE — Telephone Encounter (Signed)
[facility] (MAIL ORDER) [facility] - [place], [place] is requesting 90 day re-fill on atenolol (TENORMIN) 50 MG tablet

## 2013-09-12 NOTE — Telephone Encounter (Signed)
rx sent in electronically 

## 2014-02-20 ENCOUNTER — Ambulatory Visit (INDEPENDENT_AMBULATORY_CARE_PROVIDER_SITE_OTHER): Payer: Medicare Other

## 2014-02-20 ENCOUNTER — Encounter: Payer: Self-pay | Admitting: Gastroenterology

## 2014-02-20 ENCOUNTER — Ambulatory Visit: Payer: Medicare Other

## 2014-02-20 DIAGNOSIS — Z23 Encounter for immunization: Secondary | ICD-10-CM

## 2014-02-26 ENCOUNTER — Other Ambulatory Visit: Payer: Self-pay | Admitting: Internal Medicine

## 2014-03-01 ENCOUNTER — Encounter: Payer: Self-pay | Admitting: *Deleted

## 2014-03-05 ENCOUNTER — Ambulatory Visit (INDEPENDENT_AMBULATORY_CARE_PROVIDER_SITE_OTHER): Payer: Medicare Other | Admitting: Gastroenterology

## 2014-03-05 ENCOUNTER — Telehealth: Payer: Self-pay | Admitting: *Deleted

## 2014-03-05 ENCOUNTER — Encounter: Payer: Self-pay | Admitting: Gastroenterology

## 2014-03-05 VITALS — BP 110/70 | HR 64 | Ht 71.0 in | Wt 230.6 lb

## 2014-03-05 DIAGNOSIS — Z7901 Long term (current) use of anticoagulants: Secondary | ICD-10-CM

## 2014-03-05 DIAGNOSIS — Z86010 Personal history of colonic polyps: Secondary | ICD-10-CM

## 2014-03-05 DIAGNOSIS — Z860101 Personal history of adenomatous and serrated colon polyps: Secondary | ICD-10-CM | POA: Insufficient documentation

## 2014-03-05 MED ORDER — NA SULFATE-K SULFATE-MG SULF 17.5-3.13-1.6 GM/177ML PO SOLN: ORAL | Status: DC

## 2014-03-05 NOTE — Progress Notes (Signed)
     03/05/2014 Martin Bailey [id number] January 01, 1941   HISTORY OF PRESENT ILLNESS:  This is a pleasant 73 year old male who is known to Martin Bailey for colonoscopy in 03/2007.  At that time he was found to have colon polyps that were removed; both adenomatous and hyperplastic.  Recommended repeat colonoscopy was in 5 years from that time.  Denies any GI complaints.  Is on Pradaxa for the past several years for atrial fibrillation.     Past Medical History  Diagnosis Date  . COLONIC POLYPS, HX OF 04/05/2007    ONE ADENOMATOUS POLYP AND ONE HYPERPLASTIC POLYP  . DEPRESSION 03/04/2007  . DERMATITIS, SEBORRHEIC 07/19/2007  . FIBRILLATION, ATRIAL 03/04/2007  . HYPERLIPIDEMIA 03/04/2007  . HYPERTENSION 03/04/2007  . Kidney stones    Past Surgical History  Procedure Laterality Date  . Tonsillectomy    . Radiofrequency ablation      x2 for fib  . Colonoscopy      reports that he quit smoking about 36 years ago. He has never used smokeless tobacco. He reports that he drinks alcohol. He reports that he does not use illicit drugs. family history includes Dementia in his mother; Lung cancer in his father; Osteoporosis in his mother. No Known Allergies    Outpatient Encounter Prescriptions as of 03/05/2014  Medication Sig  . atenolol (TENORMIN) 50 MG tablet TAKE 1 BY MOUTH TWICE DAILY  . ciprofloxacin-hydrocortisone (CIPRO HC) otic suspension One drop in that left ear canal at bedtime for one month  . dabigatran (PRADAXA) 150 MG CAPS capsule Take 1 capsule (150 mg total) by mouth every 12 (twelve) hours.  . fluticasone (FLONASE) 50 MCG/ACT nasal spray Place 2 sprays into both nostrils daily.  Marland Kitchen omeprazole (PRILOSEC) 20 MG capsule Take 20 mg by mouth daily.  Marland Kitchen selenium sulfide (SELSUN) 2.5 % shampoo Apply 1 application topically daily as needed for irritation.  . triamcinolone (KENALOG) 0.5 % cream Apply topically 2 (two) times daily as needed.    Marland Kitchen amLODipine (NORVASC) 5 MG tablet Take 1 tablet (5  mg total) by mouth daily.     REVIEW OF SYSTEMS  : All other systems reviewed and negative except where noted in the History of Present Illness.   PHYSICAL EXAM: BP 110/70 mmHg  Pulse 64  Ht 5\' 11"  (1.803 m)  Wt 230 lb 9.6 oz (104.599 kg)  BMI 32.18 kg/m2 General: Well developed white male in no acute distress Head: Normocephalic and atraumatic Eyes:  Sclerae anicteric, conjunctiva pink. Ears: Normal auditory acuity Lungs: Clear throughout to auscultation Heart: Regular rate and rhythm Abdomen: Soft, non-distended.  Normal bowel sounds.  Non-tender. Rectal:  Will be done at the time of colonoscopy. Musculoskeletal: Symmetrical with no gross deformities  Skin: No lesions on visible extremities Extremities: No edema  Neurological: Alert oriented x 4, grossly non-focal Psychological:  Alert and cooperative. Normal mood and affect  ASSESSMENT AND PLAN: -History of colon polyps:  03/2007 adenomatous polyp.  Will schedule colonoscopy with Martin Bailey.  The risks, benefits, and alternatives were discussed with the patient and he consents to proceed. -Chronic anticoagulation:  On pradaxa for atrial fibrillation for the past several years.  The risks benefits and alternatives to a temporary hold of anti-coagulants/anti-platelets for the procedure were discussed with the patient he consents to proceed. Obtain clearance from Martin Bailey, patient's new PCP, for ok to hold Pradaxa for 2 days prior to procedure.

## 2014-03-05 NOTE — Telephone Encounter (Signed)
03/05/2014   RE: RUBY DILONE DOB: 1940-12-13 MRN: [id number]   Dear [name],    We have scheduled the above patient for an Colonoscopy. Our records show that he is on anticoagulation therapy.   Please advise as to how long the patient may come off his therapy of Pradaxa prior to the procedure, which is scheduled for 04-23-2014  Please route the completed form to Evette Georges., CMA.   Sincerely,    Hope Pigeon

## 2014-03-05 NOTE — Progress Notes (Signed)
Reviewed and agree with management. Jacques Fife D. Maryn Freelove, M.D., FACG  

## 2014-03-05 NOTE — Patient Instructions (Signed)
You have been scheduled for a colonoscopy with [name]. Please follow written instructions given to you at your visit today.  Please pick up your prep kit at the pharmacy within the next 1-3 days. If you use inhalers (even only as needed), please bring them with you on the day of your procedure. Your physician has requested that you go to www.startemmi.com and enter the access code given to you at your visit today(SENT TO YOUR E-MAIL). This web site gives a general overview about your procedure. However, you should still follow specific instructions given to you by our office regarding your preparation for the procedure.

## 2014-03-05 NOTE — Telephone Encounter (Signed)
I have never met the patient. I would advise you have him schedule a 15 minute visit to discuss anticoagulation with me.

## 2014-03-06 NOTE — Telephone Encounter (Signed)
See below

## 2014-03-06 NOTE — Telephone Encounter (Signed)
[name],  Patient made an appointment with you for 04-06-2014 at 9:15 am. I advised after that appointment we will know if he is able to hold Pradaxa for the colonoscopy. Patient verbalized understanding.  Thank you

## 2014-03-15 ENCOUNTER — Encounter: Payer: Self-pay | Admitting: Gastroenterology

## 2014-04-06 ENCOUNTER — Ambulatory Visit (INDEPENDENT_AMBULATORY_CARE_PROVIDER_SITE_OTHER): Payer: Medicare Other | Admitting: Family Medicine

## 2014-04-06 ENCOUNTER — Encounter: Payer: Self-pay | Admitting: Family Medicine

## 2014-04-06 DIAGNOSIS — I4819 Other persistent atrial fibrillation: Secondary | ICD-10-CM

## 2014-04-06 DIAGNOSIS — K219 Gastro-esophageal reflux disease without esophagitis: Secondary | ICD-10-CM | POA: Insufficient documentation

## 2014-04-06 DIAGNOSIS — I481 Persistent atrial fibrillation: Secondary | ICD-10-CM

## 2014-04-06 DIAGNOSIS — J309 Allergic rhinitis, unspecified: Secondary | ICD-10-CM | POA: Insufficient documentation

## 2014-04-06 MED ORDER — TRIAMCINOLONE ACETONIDE 0.5 % EX CREA: TOPICAL_CREAM | Freq: Two times a day (BID) | CUTANEOUS | Status: DC | PRN

## 2014-04-06 NOTE — Patient Instructions (Signed)
For your colonoscopy, let's hold your pradaxa 3 days before and restart the day after the procedure as long as no bleeding.   Your procedure is on a Monday, so hold on Friday, Saturday, Sunday and then restart pradaxa on Tuesday.

## 2014-04-06 NOTE — Assessment & Plan Note (Signed)
Rate controlled with atenolol. Anticoagulated with pradaxa.   Has upcoming colonoscopy. We discussed stroke risk off medication but also importance of colonoscopy. We will have patient hold pradaxa 3 days before procedure and then restart the day after the procedure as long as no bleeding complications.

## 2014-04-06 NOTE — Progress Notes (Signed)
  Martin Reddish, MD Phone: [phone]  Subjective:   Martin Bailey is a 74 y.o. year old very pleasant male patient who presents with the following:  Persistent Atrial Fibrillation- stable  History of atrial flutter about 20 years ago about once a year, then increased in frequency and with HR up to 300. [facility] worked for 3 months then symptoms recurred. Sent to [facility] and had prolonged procedure and was essentially changed from dificult to manage atrial flutter to manageable a fib. Persistent since that time. Atenolol for rate control only.   On aspirin for many years. Transitioned to coumadin but had constant abdominal cramps on this and switched back to aspirin. Has done well on pradaxa for >3 years.   Has never had a heart attack or stroke.   ROS- occasional palpitation, no chest pain, shortness of breath, flight of stairs without difficulty  Past Medical History- Patient Active Problem List   Diagnosis Date Noted  . FIBRILLATION, ATRIAL 03/04/2007    Priority: High  . Hyperlipidemia 03/04/2007    Priority: Medium  . Essential hypertension 03/04/2007    Priority: Medium  . Allergic rhinitis 04/06/2014    Priority: Low  . GERD (gastroesophageal reflux disease) 04/06/2014    Priority: Low  . Chronic anticoagulation 03/05/2014    Priority: Low  . Seborrheic dermatitis 07/19/2007    Priority: Low  . Depression 03/04/2007    Priority: Low  . Hx of adenomatous colonic polyps 03/05/2014  . Otitis, externa, infective 07/17/2013  . Testosterone deficiency 09/02/2010  . COLONIC POLYPS, HX OF 03/04/2007   Medications- reviewed and updated Current Outpatient Prescriptions  Medication Sig Dispense Refill  . atenolol (TENORMIN) 50 MG tablet TAKE 1 BY MOUTH TWICE DAILY 180 tablet 0  . dabigatran (PRADAXA) 150 MG CAPS capsule Take 1 capsule (150 mg total) by mouth every 12 (twelve) hours. 180 capsule 3  . fluticasone (FLONASE) 50 MCG/ACT nasal spray Place  2 sprays into both nostrils daily. 16 g 5  . omeprazole (PRILOSEC) 20 MG capsule Take 20 mg by mouth daily.    Marland Kitchen amLODipine (NORVASC) 5 MG tablet Take 1 tablet (5 mg total) by mouth daily. 90 tablet 3  . Na Sulfate-K Sulfate-Mg Sulf (SUPREP BOWEL PREP) SOLN Use per prep instructions (Patient not taking: Reported on 04/06/2014) 1 Bottle 0  . selenium sulfide (SELSUN) 2.5 % shampoo Apply 1 application topically daily as needed for irritation. (Patient not taking: Reported on 04/06/2014) 118 mL 5  . triamcinolone cream (KENALOG) 0.5 % Apply topically 2 (two) times daily as needed. 30 g 0   No current facility-administered medications for this visit.    Objective: BP 120/72 mmHg  Pulse 77  Temp(Src) 98 F (36.7 C)  Wt 232 lb (105.235 kg) Gen: NAD, resting comfortably CV: irregularly irregular no murmurs rubs or gallops Lungs: CTAB no crackles, wheeze, rhonchi Ext: no edema Skin: warm, dry, no rash   Assessment/Plan:  FIBRILLATION, ATRIAL Rate controlled with atenolol. Anticoagulated with pradaxa.   Has upcoming colonoscopy. We discussed stroke risk off medication but also importance of colonoscopy. We will have patient hold pradaxa 3 days before procedure and then restart the day after the procedure as long as no bleeding complications.

## 2014-04-23 ENCOUNTER — Ambulatory Visit (AMBULATORY_SURGERY_CENTER): Payer: Medicare Other | Admitting: Gastroenterology

## 2014-04-23 ENCOUNTER — Encounter: Payer: Self-pay | Admitting: Gastroenterology

## 2014-04-23 VITALS — BP 129/84 | HR 78 | Temp 96.7°F | Resp 15 | Ht 71.0 in | Wt 232.0 lb

## 2014-04-23 DIAGNOSIS — D123 Benign neoplasm of transverse colon: Secondary | ICD-10-CM

## 2014-04-23 DIAGNOSIS — D122 Benign neoplasm of ascending colon: Secondary | ICD-10-CM

## 2014-04-23 DIAGNOSIS — D125 Benign neoplasm of sigmoid colon: Secondary | ICD-10-CM

## 2014-04-23 DIAGNOSIS — Z86010 Personal history of colonic polyps: Secondary | ICD-10-CM

## 2014-04-23 MED ORDER — SODIUM CHLORIDE 0.9 % IV SOLN: 500.0000 mL | INTRAVENOUS | Status: DC

## 2014-04-23 NOTE — Progress Notes (Signed)
Called to room to assist during endoscopic procedure.  Patient ID and intended procedure confirmed with present staff. Received instructions for my participation in the procedure from the performing physician.  

## 2014-04-23 NOTE — Op Note (Signed)
[place]  [address]. [place], [address]   COLONOSCOPY PROCEDURE REPORT  PATIENT: Martin Bailey, Martin Bailey  MR#: [id number] BIRTHDATE: 1940/12/08 , 73  yrs. old GENDER: male ENDOSCOPIST: Inda Castle, MD REFERRED BY: PROCEDURE DATE:  04/23/2014 PROCEDURE:   Colonoscopy with snare polypectomy and Colonoscopy with cold biopsy polypectomy First Screening Colonoscopy - Avg.  risk and is 50 yrs.  old or older - No.  Prior Negative Screening - Now for repeat screening. N/A  History of Adenoma - Now for follow-up colonoscopy & has been > or = to 3 yrs.  Yes hx of adenoma.  Has been 3 or more years since last colonoscopy.  Polyps Removed Today? Yes. ASA CLASS:   Class II INDICATIONS:high risk personal history of colonic polyps 2009. MEDICATIONS: Monitored anesthesia care and Propofol 200 mg IV  DESCRIPTION OF PROCEDURE:   After the risks benefits and alternatives of the procedure were thoroughly explained, informed consent was obtained.  The digital rectal exam revealed no abnormalities of the rectum.   The LB CF-[id number] Loaner [id number] endoscope was introduced through the anus and advanced to the cecum, which was identified by both the appendix and ileocecal valve. No adverse events experienced.   The quality of the prep was Suprep good  The instrument was then slowly withdrawn as the colon was fully examined.      COLON FINDINGS: Three flat polyps ranging from 2 to 27mm in size were found in the ascending colon.  Polypectomies were performed with a cold snare.  The resection was complete, the polyp tissue was completely retrieved and sent to histology.   Two sessile polyps ranging from 3 to 27mm in size were found in the sigmoid colon.  A polypectomy was performed using snare cauteryon the logic follow-up and a cold polypectomy snare was used on the smaller polyp.  The resection was complete, the polyp tissue was completely retrieved and sent to histology.   A sessile  polyp measuring 2 mm in size was found at the hepatic flexure.  A polypectomy was performed with cold forceps.  Retroflexed views revealed no abnormalities. The time to cecum=2 minutes 19 seconds.  Withdrawal time=13 minutes 47 seconds.  The scope was withdrawn and the procedure completed. COMPLICATIONS: There were no immediate complications.  ENDOSCOPIC IMPRESSION: 1.   Three flat polyps ranging from 2 to 34mm in size were found in the ascending colon; polypectomies were performed with a cold snare  2.   Two sessile polyps ranging from 3 to 18mm in size were found in the sigmoid colon; polypectomy was performed using snare cautery 3.   Sessile polyp was found at the hepatic flexure; polypectomy was performed with cold forceps  RECOMMENDATIONS: If the polyp(s) removed today are proven to be adenomatous (pre-cancerous) polyps, you will need a colonoscopy in 3 years. Otherwise you should undergo followup colonoscopy in 7 years.  You will receive a letter within 1-2 weeks with the results of your biopsy as well as final recommendations.  Please call my office if you have not received a letter after 3 weeks. Resume pradaxa in 48 hours  eSigned:  Inda Castle, MD 04/23/2014 2:48 PM   cc: Garret Reddish, MD   PATIENT NAME:  Martin Bailey, Martin Bailey MR#: [id number]

## 2014-04-23 NOTE — Patient Instructions (Signed)
Resume Pradaxa in 48 hours. Repeat colonoscopy in 3 or 7 years. Await pathology results.  YOU HAD AN ENDOSCOPIC PROCEDURE TODAY AT [facility]: Refer to the procedure report that was given to you for any specific questions about what was found during the examination.  If the procedure report does not answer your questions, please call your gastroenterologist to clarify.  If you requested that your care partner not be given the details of your procedure findings, then the procedure report has been included in a sealed envelope for you to review at your convenience later.  YOU SHOULD EXPECT: Some feelings of bloating in the abdomen. Passage of more gas than usual.  Walking can help get rid of the air that was put into your GI tract during the procedure and reduce the bloating. If you had a lower endoscopy (such as a colonoscopy or flexible sigmoidoscopy) you may notice spotting of blood in your stool or on the toilet paper. If you underwent a bowel prep for your procedure, then you may not have a normal bowel movement for a few days.  DIET: Your first meal following the procedure should be a light meal and then it is ok to progress to your normal diet.  A half-sandwich or bowl of soup is an example of a good first meal.  Heavy or fried foods are harder to digest and may make you feel nauseous or bloated.  Likewise meals heavy in dairy and vegetables can cause extra gas to form and this can also increase the bloating.  Drink plenty of fluids but you should avoid alcoholic beverages for 24 hours.  ACTIVITY: Your care partner should take you home directly after the procedure.  You should plan to take it easy, moving slowly for the rest of the day.  You can resume normal activity the day after the procedure however you should NOT DRIVE or use heavy machinery for 24 hours (because of the sedation medicines used during the test).    SYMPTOMS TO REPORT IMMEDIATELY: A gastroenterologist can be  reached at any hour.  During normal business hours, 8:30 AM to 5:00 PM Monday through Friday, call [phone].  After hours and on weekends, please call the GI answering service at [phone] who will take a message and have the physician on call contact you.   Following lower endoscopy (colonoscopy or flexible sigmoidoscopy):  Excessive amounts of blood in the stool  Significant tenderness or worsening of abdominal pains  Swelling of the abdomen that is new, acute  Fever of 100F or higher  Following upper endoscopy (EGD)  Vomiting of blood or coffee ground material  New chest pain or pain under the shoulder blades  Painful or persistently difficult swallowing  New shortness of breath  Fever of 100F or higher  Black, tarry-looking stools  FOLLOW UP: If any biopsies were taken you will be contacted by phone or by letter within the next 1-3 weeks.  Call your gastroenterologist if you have not heard about the biopsies in 3 weeks.  Our staff will call the home number listed on your records the next business day following your procedure to check on you and address any questions or concerns that you may have at that time regarding the information given to you following your procedure. This is a courtesy call and so if there is no answer at the home number and we have not heard from you through the emergency physician on call, we will assume that  you have returned to your regular daily activities without incident.  SIGNATURES/CONFIDENTIALITY: You and/or your care partner have signed paperwork which will be entered into your electronic medical record.  These signatures attest to the fact that that the information above on your After Visit Summary has been reviewed and is understood.  Full responsibility of the confidentiality of this discharge information lies with you and/or your care-partner.

## 2014-04-23 NOTE — Progress Notes (Signed)
Procedure ends, to recovery, report given and VSS. 

## 2014-04-24 ENCOUNTER — Telehealth: Payer: Self-pay | Admitting: *Deleted

## 2014-04-24 NOTE — Telephone Encounter (Signed)
  Follow up Call-  Call back number 04/23/2014  Post procedure Call Back phone  # [phone]  Permission to leave phone message Yes     Patient questions:  Do you have a fever, pain , or abdominal swelling? No. Pain Score  0 *  Have you tolerated food without any problems? Yes.    Have you been able to return to your normal activities? Yes.    Do you have any questions about your discharge instructions: Diet   No. Medications  No. Follow up visit  No.  Do you have questions or concerns about your Care? No.  Actions: * If pain score is 4 or above: No action needed, pain <4.

## 2014-04-26 ENCOUNTER — Encounter: Payer: Self-pay | Admitting: Gastroenterology

## 2014-05-08 ENCOUNTER — Other Ambulatory Visit: Payer: Self-pay | Admitting: Internal Medicine

## 2014-06-21 ENCOUNTER — Encounter: Payer: Self-pay | Admitting: Family Medicine

## 2014-06-21 ENCOUNTER — Ambulatory Visit (INDEPENDENT_AMBULATORY_CARE_PROVIDER_SITE_OTHER): Payer: Medicare Other | Admitting: Family Medicine

## 2014-06-21 DIAGNOSIS — I4891 Unspecified atrial fibrillation: Secondary | ICD-10-CM

## 2014-06-21 DIAGNOSIS — Z23 Encounter for immunization: Secondary | ICD-10-CM | POA: Diagnosis not present

## 2014-06-21 DIAGNOSIS — F32A Depression, unspecified: Secondary | ICD-10-CM

## 2014-06-21 DIAGNOSIS — E785 Hyperlipidemia, unspecified: Secondary | ICD-10-CM

## 2014-06-21 DIAGNOSIS — R739 Hyperglycemia, unspecified: Secondary | ICD-10-CM

## 2014-06-21 DIAGNOSIS — I10 Essential (primary) hypertension: Secondary | ICD-10-CM

## 2014-06-21 DIAGNOSIS — Z87891 Personal history of nicotine dependence: Secondary | ICD-10-CM | POA: Diagnosis not present

## 2014-06-21 DIAGNOSIS — F1020 Alcohol dependence, uncomplicated: Secondary | ICD-10-CM | POA: Diagnosis not present

## 2014-06-21 DIAGNOSIS — F329 Major depressive disorder, single episode, unspecified: Secondary | ICD-10-CM | POA: Diagnosis not present

## 2014-06-21 MED ORDER — SELENIUM SULFIDE 2.5 % EX LOTN: 1.0000 "application " | TOPICAL_LOTION | Freq: Every day | CUTANEOUS | Status: DC | PRN

## 2014-06-21 MED ORDER — OMEPRAZOLE 20 MG PO CPDR: 20.0000 mg | DELAYED_RELEASE_CAPSULE | Freq: Every day | ORAL | Status: DC

## 2014-06-21 MED ORDER — ATENOLOL 50 MG PO TABS: ORAL_TABLET | ORAL | Status: DC

## 2014-06-21 MED ORDER — AMLODIPINE BESYLATE 5 MG PO TABS: ORAL_TABLET | ORAL | Status: DC

## 2014-06-21 MED ORDER — TRIAMCINOLONE ACETONIDE 0.5 % EX CREA: TOPICAL_CREAM | Freq: Two times a day (BID) | CUTANEOUS | Status: DC | PRN

## 2014-06-21 MED ORDER — DABIGATRAN ETEXILATE MESYLATE 150 MG PO CAPS: 150.0000 mg | ORAL_CAPSULE | Freq: Two times a day (BID) | ORAL | Status: DC

## 2014-06-21 NOTE — Progress Notes (Signed)
Martin Reddish, MD Phone: [phone]  Subjective:  Patient presents today to establish care with me as their new primary care provider. Patient was formerly a patient of Dr. Leanne Chang. Chief complaint-noted.   Depression- poor control Alcoholism- poor control -phq9 of 11 today. Patient has tried 1 ssri and Serzone in past. Had a bad experience where he did not feel cared for by counseling session but has become interested again in pursuing help potentially. Never tried wellbutrin. GI upset and erectile issues in past.   He treats these issues with alcohol 6 shots a day. 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   ROS- Thoughts of being better off dead but never of killing. No HI. Denies history of mania.   Atrial Fibrillation Does not know he is in a fib. Rate is controlled with atenolol. Compliant with pradaxa for anticoagulation ROS- denies chest pain, shortness of breath, palpitations  Hyperlipidemia-mild elevation at last check  Lab Results  Component Value Date   LDLCALC 104* 04/12/2013   On statin: no Regular exercise: inactive, advised ROS- no chest pain or shortness of breath. No myalgias  Hypertension-controlled  BP Readings from Last 3 Encounters:  06/21/14 110/68  04/23/14 129/84  04/06/14 120/72   Home BP monitoring-no Compliant with medications-yes without side effects ROS-Denies any CP, HA, SOB, blurry vision, LE edema, transient weakness, orthopnea, PND.   The following were reviewed and entered/updated in epic: Past Medical History  Diagnosis Date  . COLONIC POLYPS, HX OF 04/05/2007    ONE ADENOMATOUS POLYP AND ONE HYPERPLASTIC POLYP  . DEPRESSION 03/04/2007  . DERMATITIS, SEBORRHEIC 07/19/2007  . FIBRILLATION, ATRIAL 03/04/2007  . HYPERLIPIDEMIA 03/04/2007  . HYPERTENSION 03/04/2007  . Kidney stones    Patient Active Problem List   Diagnosis Date Noted  . Alcoholism 06/21/2014   Priority: High  . FIBRILLATION, ATRIAL 03/04/2007    Priority: High  . Hyperlipidemia 03/04/2007    Priority: Medium  . Essential hypertension 03/04/2007    Priority: Medium  . Former smoker 06/21/2014    Priority: Low  . Allergic rhinitis 04/06/2014    Priority: Low  . GERD (gastroesophageal reflux disease) 04/06/2014    Priority: Low  . Hx of adenomatous colonic polyps 03/05/2014    Priority: Low  . Chronic anticoagulation 03/05/2014    Priority: Low  . Testosterone deficiency 09/02/2010    Priority: Low  . Seborrheic dermatitis 07/19/2007    Priority: Low  . Depression 03/04/2007    Priority: Low   Past Surgical History  Procedure Laterality Date  . Tonsillectomy    . Radiofrequency ablation      x2 for fib, on 2nd attempt switched form a flutter ot a fib.   . Colonoscopy    . Rotator cuff surgery      R    Family History  Problem Relation Age of Onset  . Osteoporosis Mother   . Dementia Mother   . Lung cancer Father     former smoker    Medications- reviewed and updated Current Outpatient Prescriptions  Medication Sig Dispense Refill  . amLODipine (NORVASC) 5 MG tablet TAKE 1 BY MOUTH DAILY 90 tablet 3  . atenolol (TENORMIN) 50 MG tablet TAKE 1 BY MOUTH TWICE DAILY 180 tablet 3  . dabigatran (PRADAXA) 150 MG CAPS capsule Take 1 capsule (150 mg total) by mouth every 12 (twelve) hours. 180 capsule 3  . selenium sulfide (SELSUN)  2.5 % shampoo Apply 1 application topically daily as needed for irritation. 118 mL 5  . fluticasone (FLONASE) 50 MCG/ACT nasal spray Place 2 sprays into both nostrils daily. (Patient not taking: Reported on 06/21/2014) 16 g 5  . omeprazole (PRILOSEC) 20 MG capsule Take 1 capsule (20 mg total) by mouth daily. 90 capsule 3  . triamcinolone cream (KENALOG) 0.5 % Apply topically 2 (two) times daily as needed. 30 g 1   No current facility-administered medications for this visit.    Allergies-reviewed and updated No Known  Allergies  History   Social History  . Marital Status: Widowed    Spouse Name: N/A  . Number of Children: 1  . Years of Education: N/A   Occupational History  . retired    Social History Main Topics  . Smoking status: Former Smoker -- 1.50 packs/day for 10 years    Types: Cigarettes    Quit date: 07/08/1977  . Smokeless tobacco: Never Used  . Alcohol Use: 25.2 oz/week    42 Standard drinks or equivalent per week     Comment: 6 scotch's a day  . Drug Use: No  . Sexual Activity: Not on file   Other Topics Concern  . Not on file   Social History Narrative   Widower-wife died 43. 1 daughter. 2 grandkids. 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       Hobbies: golf, bridge, computer, sports, woodworking    ROS--See HPI   Objective: BP 110/68 mmHg  Pulse 86  Temp(Src) 98.3 F (36.8 C)  Wt 229 lb (103.874 kg) Gen: NAD, resting comfortably CV: RRR no murmurs rubs or gallops Lungs: CTAB no crackles, wheeze, rhonchi Abdomen: soft/nontender/nondistended/normal bowel sounds. No rebound or guarding.  Ext: trace edema Skin: warm, dry, no obvious rash today Neuro: grossly normal, moves all extremities, PERRLA   Assessment/Plan:  FIBRILLATION, ATRIAL Chronic. Continue rate control on atenolol and anticoagulation with pradaxa.    Hyperlipidemia Mild poor control in past. Check lipids and calculate 10 year risk    Essential hypertension Great control on  Amlodipine, atenolol   Alcoholism Encouraged AA. Not likely to quit/cut down until depression under better control-see that section   Depression We had a very long discussion today about seeking treatment. He is not yet ready to seek treatment but is willing to consider counseling again and did take a behavioral health hand out and will consider meeting with Dr. Glennon Hamilton or Cheryln Manly. We also  discussed trying wellbutrin to see if SE more tolerable for him-sexual was one concern in past.    3 month follow up   Future fasting labs Orders Placed This Encounter  Procedures  . Pneumococcal conjugate vaccine 13-valent  . CBC    [place]    Standing Status: Future     Number of Occurrences:      Standing Expiration Date: 06/21/2015  . Comprehensive metabolic panel    [place]    Standing Status: Future     Number of Occurrences:      Standing Expiration Date: 06/21/2015    Order Specific Question:  Has the patient fasted?    Answer:  No  . Lipid panel    [place]    Standing Status: Future     Number of Occurrences:      Standing Expiration Date: 06/21/2015    Order Specific Question:  Has the patient fasted?    Answer:  No  . Hemoglobin A1c    [place]    Standing Status: Future     Number of Occurrences:      Standing Expiration Date: 06/21/2015  . TSH    [place]    Standing Status: Future     Number of Occurrences:      Standing Expiration Date: 06/21/2015  . POCT urinalysis dipstick    Standing Status: Future     Number of Occurrences:      Standing Expiration Date: 06/21/2015    Meds ordered this encounter  Medications  . atenolol (TENORMIN) 50 MG tablet    Sig: TAKE 1 BY MOUTH TWICE DAILY    Dispense:  180 tablet    Refill:  3    NEEDS OV  . amLODipine (NORVASC) 5 MG tablet    Sig: TAKE 1 BY MOUTH DAILY    Dispense:  90 tablet    Refill:  3  . omeprazole (PRILOSEC) 20 MG capsule    Sig: Take 1 capsule (20 mg total) by mouth daily.    Dispense:  90 capsule    Refill:  3  . triamcinolone cream (KENALOG) 0.5 %    Sig: Apply topically 2 (two) times daily as needed.    Dispense:  30 g    Refill:  1  . dabigatran (PRADAXA) 150 MG CAPS capsule    Sig: Take 1 capsule (150 mg total) by mouth every 12 (twelve) hours.    Dispense:  180 capsule    Refill:  3  . selenium sulfide (SELSUN) 2.5 % shampoo    Sig: Apply 1 application topically daily as needed for  irritation.    Dispense:  118 mL    Refill:  5   >50% of 45 minute office visit was spent on counseling about depression and coordination of care

## 2014-06-21 NOTE — Patient Instructions (Addendum)
Received final pneumonia shot today ([id number])  Schedule a visit for bloodwork within a week or two with nothing but water after midnight.   Lets check in 3 months from now  Please strongly consider calling either of the 2 options we discussed. I think you could benefit from counseling. I want you to feel better and enjoy life more.

## 2014-06-22 ENCOUNTER — Encounter: Payer: Self-pay | Admitting: Family Medicine

## 2014-06-22 NOTE — Assessment & Plan Note (Signed)
Great control on  Amlodipine, atenolol

## 2014-06-22 NOTE — Assessment & Plan Note (Signed)
Mild poor control in past. Check lipids and calculate 10 year risk

## 2014-06-22 NOTE — Assessment & Plan Note (Signed)
Encouraged AA. Not likely to quit/cut down until depression under better control-see that section

## 2014-06-22 NOTE — Assessment & Plan Note (Signed)
We had a very long discussion today about seeking treatment. He is not yet ready to seek treatment but is willing to consider counseling again and did take a behavioral health hand out and will consider meeting with Dr. Glennon Hamilton or Cheryln Manly. We also discussed trying wellbutrin to see if SE more tolerable for him-sexual was one concern in past.

## 2014-06-22 NOTE — Assessment & Plan Note (Signed)
Chronic. Continue rate control on atenolol and anticoagulation with pradaxa.

## 2014-06-28 ENCOUNTER — Other Ambulatory Visit (INDEPENDENT_AMBULATORY_CARE_PROVIDER_SITE_OTHER): Payer: Medicare Other

## 2014-06-28 DIAGNOSIS — I10 Essential (primary) hypertension: Secondary | ICD-10-CM | POA: Diagnosis not present

## 2014-06-28 DIAGNOSIS — E785 Hyperlipidemia, unspecified: Secondary | ICD-10-CM

## 2014-06-28 DIAGNOSIS — R739 Hyperglycemia, unspecified: Secondary | ICD-10-CM

## 2014-06-28 DIAGNOSIS — Z87891 Personal history of nicotine dependence: Secondary | ICD-10-CM

## 2014-06-28 LAB — COMPREHENSIVE METABOLIC PANEL
ALBUMIN: 4.1 g/dL (ref 3.5–5.2)
ALT: 18 U/L (ref 0–53)
AST: 18 U/L (ref 0–37)
Alkaline Phosphatase: 56 U/L (ref 39–117)
BUN: 11 mg/dL (ref 6–23)
CHLORIDE: 101 meq/L (ref 96–112)
CO2: 28 mEq/L (ref 19–32)
Calcium: 9.2 mg/dL (ref 8.4–10.5)
Creatinine, Ser: 1.02 mg/dL (ref 0.40–1.50)
GFR: 76.27 mL/min (ref >60.00)
Glucose, Bld: 104 mg/dL — ABNORMAL HIGH (ref 70–99)
POTASSIUM: 4.6 meq/L (ref 3.5–5.1)
SODIUM: 136 meq/L (ref 135–145)
Total Bilirubin: 0.6 mg/dL (ref 0.2–1.2)
Total Protein: 7.3 g/dL (ref 6.0–8.3)

## 2014-06-28 LAB — POCT URINALYSIS DIPSTICK
Bilirubin, UA: NEGATIVE
Blood, UA: NEGATIVE
GLUCOSE UA: NEGATIVE
KETONES UA: NEGATIVE
LEUKOCYTES UA: NEGATIVE
NITRITE UA: NEGATIVE
PH UA: 7
Protein, UA: NEGATIVE
Spec Grav, UA: 1.015
Urobilinogen, UA: 0.2

## 2014-06-28 LAB — LIPID PANEL
Cholesterol: 162 mg/dL (ref 0–200)
HDL: 43.7 mg/dL (ref >39.00)
LDL Cholesterol: 99 mg/dL (ref 0–99)
NONHDL: 118.3
Total CHOL/HDL Ratio: 4
Triglycerides: 98 mg/dL (ref 0.0–149.0)
VLDL: 19.6 mg/dL (ref 0.0–40.0)

## 2014-06-28 LAB — TSH: TSH: 5.79 u[IU]/mL — ABNORMAL HIGH (ref 0.35–4.50)

## 2014-06-29 LAB — CBC
HCT: 44.3 % (ref 39.0–52.0)
Hemoglobin: 14.9 g/dL (ref 13.0–17.0)
MCHC: 33.6 g/dL (ref 30.0–36.0)
MCV: 95.6 fl (ref 78.0–100.0)
Platelets: 205 10*3/uL (ref 150.0–400.0)
RBC: 4.63 Mil/uL (ref 4.22–5.81)
RDW: 13.6 % (ref 11.5–15.5)
WBC: 8.7 10*3/uL (ref 4.0–10.5)

## 2014-06-29 LAB — HEMOGLOBIN A1C: Hgb A1c MFr Bld: 5.5 % (ref 4.6–6.5)

## 2014-09-21 ENCOUNTER — Ambulatory Visit (INDEPENDENT_AMBULATORY_CARE_PROVIDER_SITE_OTHER): Payer: Medicare Other | Admitting: Family Medicine

## 2014-09-21 ENCOUNTER — Encounter: Payer: Self-pay | Admitting: Family Medicine

## 2014-09-21 VITALS — BP 112/64 | HR 81 | Temp 98.1°F | Wt 221.0 lb

## 2014-09-21 DIAGNOSIS — J302 Other seasonal allergic rhinitis: Secondary | ICD-10-CM | POA: Diagnosis not present

## 2014-09-21 DIAGNOSIS — I481 Persistent atrial fibrillation: Secondary | ICD-10-CM

## 2014-09-21 DIAGNOSIS — R946 Abnormal results of thyroid function studies: Secondary | ICD-10-CM | POA: Diagnosis not present

## 2014-09-21 DIAGNOSIS — R7989 Other specified abnormal findings of blood chemistry: Secondary | ICD-10-CM

## 2014-09-21 DIAGNOSIS — F329 Major depressive disorder, single episode, unspecified: Secondary | ICD-10-CM

## 2014-09-21 DIAGNOSIS — I10 Essential (primary) hypertension: Secondary | ICD-10-CM | POA: Diagnosis not present

## 2014-09-21 DIAGNOSIS — I4819 Other persistent atrial fibrillation: Secondary | ICD-10-CM

## 2014-09-21 DIAGNOSIS — F32A Depression, unspecified: Secondary | ICD-10-CM

## 2014-09-21 NOTE — Progress Notes (Signed)
Garret Reddish, MD  Subjective:  Martin Bailey is a 74 y.o. year old very pleasant male patient who presents with:  Depression-improved Last visit beh health advised, Wellbutrin considered- patient declined both last visit. He states things are Easier in summer. Enjoying bridge and golf. Problems- alone and cant perform musically before which he thinks therapy will not help. Still drinking, not interested in AA- 25 drinks a week.  ROS- No SI/HI  Hypertension-controlled  Atrial fibrillation- rate controlled and anticoagulated BP Readings from Last 3 Encounters:  09/21/14 112/64  06/21/14 110/68  04/23/14 129/84   Home BP monitoring-no Compliant with medications-yes without side effects ROS-Denies any CP, HA, SOB, blurry vision, LE edema. Denies palpitations.   Elevated TSH- asymptomatic in regards to hypothyroidism (except potential depression) Lab Results  Component Value Date   TSH 5.79* 06/28/2014   On thyroid medication-no ROS-No hair or nail changes. No heat/cold intolerance. No constipation or diarrhea. Denies shakiness or anxiety.   Also See problem oriented charting  Past Medical History- alcoholism (declines AA), seb derm, testosterone deficiency, former smoker quit before 1980  Medications- reviewed and updated Current Outpatient Prescriptions  Medication Sig Dispense Refill  . amLODipine (NORVASC) 5 MG tablet TAKE 1 BY MOUTH DAILY 90 tablet 3  . atenolol (TENORMIN) 50 MG tablet TAKE 1 BY MOUTH TWICE DAILY 180 tablet 3  . dabigatran (PRADAXA) 150 MG CAPS capsule Take 1 capsule (150 mg total) by mouth every 12 (twelve) hours. 180 capsule 3  . fluticasone (FLONASE) 50 MCG/ACT nasal spray Place 2 sprays into both nostrils daily. (Patient not taking: Reported on 06/21/2014) 16 g 5  . omeprazole (PRILOSEC) 20 MG capsule Take 1 capsule (20 mg total) by mouth daily. 90 capsule 3  . selenium sulfide (SELSUN) 2.5 % shampoo Apply 1 application topically daily as needed for  irritation. 118 mL 5  . triamcinolone cream (KENALOG) 0.5 % Apply topically 2 (two) times daily as needed. 30 g 1   Objective: BP 112/64 mmHg  Pulse 81  Temp(Src) 98.1 F (36.7 C)  Wt 221 lb (100.245 kg) Gen: NAD, resting comfortably Mild rhinorrhea CV: irregularly irregular no murmurs rubs or gallops Lungs: CTAB no crackles, wheeze, rhonchi Abdomen: soft/nontender/nondistended/normal bowel sounds. No rebound or guarding.  Ext: no edema Skin: warm, dry, no rash Neuro: grossly normal, moves all extremities  Assessment/Plan:  Allergic rhinitis S:Trouble with sinus drainage leaning back in recliner- stated coughing. Improved a lot but still some present. Present 2-3 months. Sudafed and claritin little help. Has not used his flonase in a while A/P: advised flonase, if no improvement can add claritin. Return if combo does not work   FIBRILLATION, ATRIAL S: Chronic/persistent. Atenolol rate control. Pradaxa anticoagulation.  A/P: continue current therapy   Essential hypertension Continue Amlodipine, atenolol as controlled  Elevated TSH Check TSH at next visit. Symptomatically euthyroid (unless depression is caused by hypothyroidism but doubt)  Depression Declines seeing psychology or taking medicine. Fortunately symptoms better in summer. Follow up 3-4 months.   3-4 months f/u.

## 2014-09-21 NOTE — Assessment & Plan Note (Signed)
Check TSH at next visit. Symptomatically euthyroid (unless depression is caused by hypothyroidism but doubt)

## 2014-09-21 NOTE — Assessment & Plan Note (Signed)
Declines seeing psychology or taking medicine. Fortunately symptoms better in summer. Follow up 3-4 months.

## 2014-09-21 NOTE — Assessment & Plan Note (Signed)
S:Trouble with sinus drainage leaning back in recliner- stated coughing. Improved a lot but still some present. Present 2-3 months. Sudafed and claritin little help. Has not used his flonase in a while A/P: advised flonase, if no improvement can add claritin. Return if combo does not work

## 2014-09-21 NOTE — Assessment & Plan Note (Signed)
Continue Amlodipine, atenolol as controlled

## 2014-09-21 NOTE — Assessment & Plan Note (Signed)
S: Chronic/persistent. Atenolol rate control. Pradaxa anticoagulation.  A/P: continue current therapy

## 2014-09-21 NOTE — Patient Instructions (Addendum)
Trial flonase for the sinus drainage. If not helpful, can add claritin in addition.   BP looks great  A fib stable  As always, still would love for you to speak with one of our psychologists- think it would help you.  1. Lower sugar 2. Lower weight 3. Protect liver  Do labs when you comeback to recheck thyroid

## 2014-11-09 ENCOUNTER — Other Ambulatory Visit: Payer: Self-pay | Admitting: Internal Medicine

## 2014-11-13 ENCOUNTER — Other Ambulatory Visit: Payer: Self-pay | Admitting: *Deleted

## 2014-11-13 MED ORDER — FLUTICASONE PROPIONATE 50 MCG/ACT NA SUSP: NASAL | Status: DC

## 2014-11-16 ENCOUNTER — Ambulatory Visit (INDEPENDENT_AMBULATORY_CARE_PROVIDER_SITE_OTHER): Payer: No Typology Code available for payment source | Admitting: Psychology

## 2014-11-16 DIAGNOSIS — F341 Dysthymic disorder: Secondary | ICD-10-CM

## 2014-11-29 ENCOUNTER — Ambulatory Visit (INDEPENDENT_AMBULATORY_CARE_PROVIDER_SITE_OTHER): Payer: No Typology Code available for payment source | Admitting: Psychology

## 2014-11-29 DIAGNOSIS — F341 Dysthymic disorder: Secondary | ICD-10-CM

## 2014-12-07 ENCOUNTER — Ambulatory Visit (INDEPENDENT_AMBULATORY_CARE_PROVIDER_SITE_OTHER): Payer: No Typology Code available for payment source | Admitting: Psychology

## 2014-12-07 DIAGNOSIS — F341 Dysthymic disorder: Secondary | ICD-10-CM

## 2014-12-14 ENCOUNTER — Ambulatory Visit (INDEPENDENT_AMBULATORY_CARE_PROVIDER_SITE_OTHER): Payer: No Typology Code available for payment source | Admitting: Psychology

## 2014-12-14 DIAGNOSIS — F341 Dysthymic disorder: Secondary | ICD-10-CM | POA: Diagnosis not present

## 2014-12-20 ENCOUNTER — Ambulatory Visit (INDEPENDENT_AMBULATORY_CARE_PROVIDER_SITE_OTHER): Payer: No Typology Code available for payment source | Admitting: Psychology

## 2014-12-20 DIAGNOSIS — F341 Dysthymic disorder: Secondary | ICD-10-CM

## 2014-12-21 ENCOUNTER — Ambulatory Visit (INDEPENDENT_AMBULATORY_CARE_PROVIDER_SITE_OTHER): Payer: Medicare Other | Admitting: Family Medicine

## 2014-12-21 ENCOUNTER — Encounter: Payer: Self-pay | Admitting: Family Medicine

## 2014-12-21 VITALS — BP 120/74 | HR 76 | Temp 98.0°F | Wt 222.0 lb

## 2014-12-21 DIAGNOSIS — F329 Major depressive disorder, single episode, unspecified: Secondary | ICD-10-CM

## 2014-12-21 DIAGNOSIS — R946 Abnormal results of thyroid function studies: Secondary | ICD-10-CM

## 2014-12-21 DIAGNOSIS — I481 Persistent atrial fibrillation: Secondary | ICD-10-CM | POA: Diagnosis not present

## 2014-12-21 DIAGNOSIS — I4819 Other persistent atrial fibrillation: Secondary | ICD-10-CM

## 2014-12-21 DIAGNOSIS — R7989 Other specified abnormal findings of blood chemistry: Secondary | ICD-10-CM

## 2014-12-21 DIAGNOSIS — J302 Other seasonal allergic rhinitis: Secondary | ICD-10-CM | POA: Diagnosis not present

## 2014-12-21 DIAGNOSIS — Z23 Encounter for immunization: Secondary | ICD-10-CM

## 2014-12-21 DIAGNOSIS — I10 Essential (primary) hypertension: Secondary | ICD-10-CM

## 2014-12-21 DIAGNOSIS — F32A Depression, unspecified: Secondary | ICD-10-CM

## 2014-12-21 LAB — T4, FREE: FREE T4: 0.85 ng/dL (ref 0.60–1.60)

## 2014-12-21 LAB — TSH: TSH: 3.84 u[IU]/mL (ref 0.35–4.50)

## 2014-12-21 NOTE — Patient Instructions (Addendum)
Received flu shot today.  Check thyroid labs before you leave  Glad the allergies are better  A fib -still in it BLood pressure- looks great  Glad things are going well with Dr. Glennon Hamilton  Let's follow up in 3.5 months

## 2014-12-21 NOTE — Assessment & Plan Note (Signed)
S: controlled. On Amlodipine, atenolol BP Readings from Last 3 Encounters:  12/21/14 120/74  09/21/14 112/64  06/21/14 110/68  A/P:Continue current meds.

## 2014-12-21 NOTE — Assessment & Plan Note (Signed)
S: Patient appears to be doing much better since seeing Dr. Glennon Hamilton. He seems to have some hopes for the future such as losing weight, walking 9 holes of golf. He is back on Treadmill for 6 days in a row- started back A/P: continue psychotherapy. Seems to be helping [place] for other goals- exercise to hopefully reduce risk of diabets (sugar 104 but a1c 5.5 last visit). Alcohol consumption down about 1 a day as well)

## 2014-12-21 NOTE — Assessment & Plan Note (Signed)
S: remains symptomatically euthyroid.  Lab Results  Component Value Date   TSH 5.79* 06/28/2014  A/P:  Check TSH and T4 today

## 2014-12-21 NOTE — Progress Notes (Signed)
Garret Reddish, MD  Subjective:  Martin Bailey is a 73 y.o. year old very pleasant male patient who presents for/with See problem oriented charting ROS-No hair or nail changes. No heat/cold intolerance. No constipation or diarrhea. Denies shakiness or anxiety. Gained 1 lb in 3 months. No chest pain or shortness of breath.    Past Medical History-  Patient Active Problem List   Diagnosis Date Noted  . Alcoholism 06/21/2014    Priority: High  . FIBRILLATION, ATRIAL 03/04/2007    Priority: High  . Hyperlipidemia 03/04/2007    Priority: Medium  . Essential hypertension 03/04/2007    Priority: Medium  . Former smoker 06/21/2014    Priority: Low  . Allergic rhinitis 04/06/2014    Priority: Low  . GERD (gastroesophageal reflux disease) 04/06/2014    Priority: Low  . Hx of adenomatous colonic polyps 03/05/2014    Priority: Low  . Chronic anticoagulation 03/05/2014    Priority: Low  . Testosterone deficiency 09/02/2010    Priority: Low  . Seborrheic dermatitis 07/19/2007    Priority: Low  . Depression 03/04/2007    Priority: Low  . Elevated TSH 09/21/2014    Medications- reviewed and updated Current Outpatient Prescriptions  Medication Sig Dispense Refill  . amLODipine (NORVASC) 5 MG tablet TAKE 1 BY MOUTH DAILY 90 tablet 3  . atenolol (TENORMIN) 50 MG tablet TAKE 1 BY MOUTH TWICE DAILY 180 tablet 3  . dabigatran (PRADAXA) 150 MG CAPS capsule Take 1 capsule (150 mg total) by mouth every 12 (twelve) hours. 180 capsule 3  . fluticasone (FLONASE) 50 MCG/ACT nasal spray USE TWO SPRAY(S) IN BOTH NOSTRILS  DAILY. 16 g 0  . omeprazole (PRILOSEC) 20 MG capsule Take 1 capsule (20 mg total) by mouth daily. 90 capsule 3  . selenium sulfide (SELSUN) 2.5 % shampoo Apply 1 application topically daily as needed for irritation. 118 mL 5  . triamcinolone cream (KENALOG) 0.5 % Apply topically 2 (two) times daily as needed. 30 g 1   No current facility-administered medications for this visit.     Objective: BP 120/74 mmHg  Pulse 76  Temp(Src) 98 F (36.7 C)  Wt 222 lb (100.699 kg) Gen: NAD, resting comfortably Mucous membranes are moist. Oropharynx normal CV: irregularly irregular no murmurs rubs or gallops Lungs: CTAB no crackles, wheeze, rhonchi Abdomen: soft/nontender/nondistended/normal bowel sounds. No rebound or guarding.  Ext: no edema, 2+ PT pulses Skin: warm, dry  Assessment/Plan:  FIBRILLATION, ATRIAL S: Chronic/persistent but rate controlled on Atenolol and anticoagulated on Pradaxa.  A/P: continue current rx   Allergic rhinitis S: last visit we discussed combo flonase and claritin due to poor control. Patient is on claritin daily and flonase 2-3 days a week and symptoms doing well A/P: continue current prescription   Essential hypertension S: controlled. On Amlodipine, atenolol BP Readings from Last 3 Encounters:  12/21/14 120/74  09/21/14 112/64  06/21/14 110/68  A/P:Continue current meds.    Elevated TSH S: remains symptomatically euthyroid.  Lab Results  Component Value Date   TSH 5.79* 06/28/2014  A/P:  Check TSH and T4 today   Depression S: Patient appears to be doing much better since seeing Dr. Glennon Hamilton. He seems to have some hopes for the future such as losing weight, walking 9 holes of golf. He is back on Treadmill for 6 days in a row- started back A/P: continue psychotherapy. Seems to be helping [place] for other goals- exercise to hopefully reduce risk of diabets (sugar 104 but a1c 5.5  last visit). Alcohol consumption down about 1 a day as well)  3.5 month follow up unless lab abnormality  Orders Placed This Encounter  Procedures  . Flu Vaccine QUAD 36+ mos IM  . TSH    [place]  . T4, free    [place]

## 2014-12-21 NOTE — Assessment & Plan Note (Signed)
S: last visit we discussed combo flonase and claritin due to poor control. Patient is on claritin daily and flonase 2-3 days a week and symptoms doing well A/P: continue current prescription

## 2014-12-21 NOTE — Assessment & Plan Note (Signed)
S: Chronic/persistent but rate controlled on Atenolol and anticoagulated on Pradaxa.  A/P: continue current rx

## 2014-12-27 ENCOUNTER — Ambulatory Visit (INDEPENDENT_AMBULATORY_CARE_PROVIDER_SITE_OTHER): Payer: No Typology Code available for payment source | Admitting: Psychology

## 2014-12-27 DIAGNOSIS — F341 Dysthymic disorder: Secondary | ICD-10-CM | POA: Diagnosis not present

## 2015-01-03 ENCOUNTER — Ambulatory Visit (INDEPENDENT_AMBULATORY_CARE_PROVIDER_SITE_OTHER): Payer: No Typology Code available for payment source | Admitting: Psychology

## 2015-01-03 DIAGNOSIS — F341 Dysthymic disorder: Secondary | ICD-10-CM | POA: Diagnosis not present

## 2015-01-10 ENCOUNTER — Ambulatory Visit (INDEPENDENT_AMBULATORY_CARE_PROVIDER_SITE_OTHER): Payer: No Typology Code available for payment source | Admitting: Psychology

## 2015-01-10 DIAGNOSIS — F341 Dysthymic disorder: Secondary | ICD-10-CM

## 2015-01-17 ENCOUNTER — Ambulatory Visit (INDEPENDENT_AMBULATORY_CARE_PROVIDER_SITE_OTHER): Payer: No Typology Code available for payment source | Admitting: Psychology

## 2015-01-17 DIAGNOSIS — F341 Dysthymic disorder: Secondary | ICD-10-CM

## 2015-01-24 ENCOUNTER — Ambulatory Visit (INDEPENDENT_AMBULATORY_CARE_PROVIDER_SITE_OTHER): Payer: No Typology Code available for payment source | Admitting: Psychology

## 2015-01-24 DIAGNOSIS — F341 Dysthymic disorder: Secondary | ICD-10-CM

## 2015-02-07 ENCOUNTER — Ambulatory Visit (INDEPENDENT_AMBULATORY_CARE_PROVIDER_SITE_OTHER): Payer: No Typology Code available for payment source | Admitting: Psychology

## 2015-02-07 DIAGNOSIS — F341 Dysthymic disorder: Secondary | ICD-10-CM | POA: Diagnosis not present

## 2015-02-20 ENCOUNTER — Ambulatory Visit (INDEPENDENT_AMBULATORY_CARE_PROVIDER_SITE_OTHER): Payer: No Typology Code available for payment source | Admitting: Psychology

## 2015-02-20 DIAGNOSIS — F341 Dysthymic disorder: Secondary | ICD-10-CM | POA: Diagnosis not present

## 2015-03-07 ENCOUNTER — Ambulatory Visit (INDEPENDENT_AMBULATORY_CARE_PROVIDER_SITE_OTHER): Payer: No Typology Code available for payment source | Admitting: Psychology

## 2015-03-07 DIAGNOSIS — F341 Dysthymic disorder: Secondary | ICD-10-CM | POA: Diagnosis not present

## 2015-03-13 ENCOUNTER — Other Ambulatory Visit: Payer: Self-pay | Admitting: Family Medicine

## 2015-03-14 ENCOUNTER — Ambulatory Visit (INDEPENDENT_AMBULATORY_CARE_PROVIDER_SITE_OTHER): Payer: No Typology Code available for payment source | Admitting: Psychology

## 2015-03-14 DIAGNOSIS — F341 Dysthymic disorder: Secondary | ICD-10-CM

## 2015-03-21 ENCOUNTER — Ambulatory Visit (INDEPENDENT_AMBULATORY_CARE_PROVIDER_SITE_OTHER): Payer: No Typology Code available for payment source | Admitting: Psychology

## 2015-03-21 DIAGNOSIS — F341 Dysthymic disorder: Secondary | ICD-10-CM

## 2015-04-04 ENCOUNTER — Ambulatory Visit (INDEPENDENT_AMBULATORY_CARE_PROVIDER_SITE_OTHER): Payer: No Typology Code available for payment source | Admitting: Psychology

## 2015-04-04 DIAGNOSIS — F341 Dysthymic disorder: Secondary | ICD-10-CM | POA: Diagnosis not present

## 2015-04-05 ENCOUNTER — Encounter: Payer: Self-pay | Admitting: Family Medicine

## 2015-04-05 ENCOUNTER — Ambulatory Visit (INDEPENDENT_AMBULATORY_CARE_PROVIDER_SITE_OTHER): Payer: Medicare Other | Admitting: Family Medicine

## 2015-04-05 VITALS — BP 122/62 | HR 75 | Temp 97.5°F | Wt 222.0 lb

## 2015-04-05 DIAGNOSIS — I10 Essential (primary) hypertension: Secondary | ICD-10-CM

## 2015-04-05 DIAGNOSIS — F332 Major depressive disorder, recurrent severe without psychotic features: Secondary | ICD-10-CM

## 2015-04-05 DIAGNOSIS — I482 Chronic atrial fibrillation, unspecified: Secondary | ICD-10-CM

## 2015-04-05 NOTE — Assessment & Plan Note (Addendum)
S: declines medication due to past intolerance of serzone and celexa. He is in counseling with Dr. Glennon Hamilton which previously reported helping and now states not going to improve until he gets into a relationship with male likely (widower). Considering establishing male relationships as well- some bridge and golf currently A/P: encouraged patient to continue therapy as well as pursue establishing relationships. Declines medication trial. Denies SI. Still drinking at least 25 drinks a week and declines AA.

## 2015-04-05 NOTE — Patient Instructions (Signed)
No changes today- blood pressure and atrial fibrillation doing well

## 2015-04-05 NOTE — Assessment & Plan Note (Signed)
S: chronic a fib but asymptomatic. Rate controlled with atenolol and anticoagulated with pradaxa A/P: doing well, continue current rx

## 2015-04-05 NOTE — Progress Notes (Signed)
Garret Reddish, MD  Subjective:  Martin Bailey is a 75 y.o. year old very pleasant male patient who presents for/with See problem oriented charting ROS- No chest pain or shortness of breath. No headache or blurry vision. No SI/HI. Some mild nasal congestion  Past Medical History-  Patient Active Problem List   Diagnosis Date Noted  . Alcoholism ([place]) 06/21/2014    Priority: High  . FIBRILLATION, ATRIAL 03/04/2007    Priority: High  . Hyperlipidemia 03/04/2007    Priority: Medium  . Essential hypertension 03/04/2007    Priority: Medium  . Former smoker 06/21/2014    Priority: Low  . Allergic rhinitis 04/06/2014    Priority: Low  . GERD (gastroesophageal reflux disease) 04/06/2014    Priority: Low  . Hx of adenomatous colonic polyps 03/05/2014    Priority: Low  . Chronic anticoagulation 03/05/2014    Priority: Low  . Testosterone deficiency 09/02/2010    Priority: Low  . Seborrheic dermatitis 07/19/2007    Priority: Low  . Major depression ([place]) 03/04/2007    Priority: Low  . Elevated TSH 09/21/2014    Medications- reviewed and updated Current Outpatient Prescriptions  Medication Sig Dispense Refill  . amLODipine (NORVASC) 5 MG tablet TAKE 1 BY MOUTH DAILY 90 tablet 3  . atenolol (TENORMIN) 50 MG tablet TAKE 1 BY MOUTH TWICE DAILY 180 tablet 3  . dabigatran (PRADAXA) 150 MG CAPS capsule Take 1 capsule (150 mg total) by mouth every 12 (twelve) hours. 180 capsule 3  . fluticasone (FLONASE) 50 MCG/ACT nasal spray USE TWO SPRAYS IN BOTH NOSTRILS DAILY. 16 g 5  . omeprazole (PRILOSEC) 20 MG capsule Take 1 capsule (20 mg total) by mouth daily. 90 capsule 3  . selenium sulfide (SELSUN) 2.5 % shampoo Apply 1 application topically daily as needed for irritation. (Patient not taking: Reported on 04/05/2015) 118 mL 5  . triamcinolone cream (KENALOG) 0.5 % Apply topically 2 (two) times daily as needed. (Patient not taking: Reported on 04/05/2015) 30 g 1   No current facility-administered  medications for this visit.    Objective: BP 122/62 mmHg  Pulse 75  Temp(Src) 97.5 F (36.4 C)  Wt 222 lb (100.699 kg) Gen: NAD, resting comfortably TM normal, oropharynx normal CV: irregularly irregular, no murmurs rubs or gallops Lungs: CTAB no crackles, wheeze, rhonchi Abdomen: soft/nontender/nondistended/normal bowel sounds. No rebound or guarding.  Ext: no edema Skin: warm, dry, no rash Neuro: grossly normal, moves all extremities   Assessment/Plan:  FIBRILLATION, ATRIAL S: chronic a fib but asymptomatic. Rate controlled with atenolol and anticoagulated with pradaxa A/P: doing well, continue current rx   Essential hypertension S: controlled. On amlodipine '5mg'$  and atenolol '50mg'$   BP Readings from Last 3 Encounters:  04/05/15 122/62  12/21/14 120/74  09/21/14 112/64  A/P:Continue current medications   Major depression ([place]) S: declines medication due to past intolerance of serzone and celexa. He is in counseling with Dr. Glennon Hamilton which previously reported helping and now states not going to improve until he gets into a relationship with male likely (widower). Considering establishing male relationships as well- some bridge and golf currently A/P: encouraged patient to continue therapy as well as pursue establishing relationships. Declines medication trial. Denies SI. Still drinking at least 25 drinks a week and declines AA.    Next available physical at least 4 months out [place] Return precautions advised.

## 2015-04-05 NOTE — Assessment & Plan Note (Signed)
S: controlled. On amlodipine '5mg'$  and atenolol '50mg'$   BP Readings from Last 3 Encounters:  04/05/15 122/62  12/21/14 120/74  09/21/14 112/64  A/P:Continue current medications

## 2015-04-11 ENCOUNTER — Ambulatory Visit (INDEPENDENT_AMBULATORY_CARE_PROVIDER_SITE_OTHER): Payer: No Typology Code available for payment source | Admitting: Psychology

## 2015-04-11 DIAGNOSIS — F341 Dysthymic disorder: Secondary | ICD-10-CM

## 2015-04-18 ENCOUNTER — Ambulatory Visit (INDEPENDENT_AMBULATORY_CARE_PROVIDER_SITE_OTHER): Payer: No Typology Code available for payment source | Admitting: Psychology

## 2015-04-18 DIAGNOSIS — F341 Dysthymic disorder: Secondary | ICD-10-CM | POA: Diagnosis not present

## 2015-04-25 ENCOUNTER — Ambulatory Visit (INDEPENDENT_AMBULATORY_CARE_PROVIDER_SITE_OTHER): Payer: No Typology Code available for payment source | Admitting: Psychology

## 2015-04-25 DIAGNOSIS — F341 Dysthymic disorder: Secondary | ICD-10-CM

## 2015-05-02 ENCOUNTER — Ambulatory Visit (INDEPENDENT_AMBULATORY_CARE_PROVIDER_SITE_OTHER): Payer: No Typology Code available for payment source | Admitting: Psychology

## 2015-05-02 DIAGNOSIS — F341 Dysthymic disorder: Secondary | ICD-10-CM

## 2015-05-09 ENCOUNTER — Ambulatory Visit (INDEPENDENT_AMBULATORY_CARE_PROVIDER_SITE_OTHER): Payer: No Typology Code available for payment source | Admitting: Psychology

## 2015-05-09 DIAGNOSIS — F341 Dysthymic disorder: Secondary | ICD-10-CM

## 2015-05-23 ENCOUNTER — Ambulatory Visit (INDEPENDENT_AMBULATORY_CARE_PROVIDER_SITE_OTHER): Payer: No Typology Code available for payment source | Admitting: Psychology

## 2015-05-23 DIAGNOSIS — F341 Dysthymic disorder: Secondary | ICD-10-CM | POA: Diagnosis not present

## 2015-05-30 ENCOUNTER — Ambulatory Visit: Payer: No Typology Code available for payment source | Admitting: Psychology

## 2015-06-06 ENCOUNTER — Ambulatory Visit (INDEPENDENT_AMBULATORY_CARE_PROVIDER_SITE_OTHER): Payer: No Typology Code available for payment source | Admitting: Psychology

## 2015-06-06 DIAGNOSIS — F341 Dysthymic disorder: Secondary | ICD-10-CM

## 2015-06-13 ENCOUNTER — Ambulatory Visit (INDEPENDENT_AMBULATORY_CARE_PROVIDER_SITE_OTHER): Payer: No Typology Code available for payment source | Admitting: Psychology

## 2015-06-13 DIAGNOSIS — F341 Dysthymic disorder: Secondary | ICD-10-CM | POA: Diagnosis not present

## 2015-06-27 ENCOUNTER — Ambulatory Visit (INDEPENDENT_AMBULATORY_CARE_PROVIDER_SITE_OTHER): Payer: No Typology Code available for payment source | Admitting: Psychology

## 2015-06-27 DIAGNOSIS — F341 Dysthymic disorder: Secondary | ICD-10-CM

## 2015-07-04 ENCOUNTER — Ambulatory Visit (INDEPENDENT_AMBULATORY_CARE_PROVIDER_SITE_OTHER): Payer: No Typology Code available for payment source | Admitting: Psychology

## 2015-07-04 DIAGNOSIS — F341 Dysthymic disorder: Secondary | ICD-10-CM

## 2015-07-11 ENCOUNTER — Ambulatory Visit (INDEPENDENT_AMBULATORY_CARE_PROVIDER_SITE_OTHER): Payer: No Typology Code available for payment source | Admitting: Psychology

## 2015-07-11 DIAGNOSIS — F341 Dysthymic disorder: Secondary | ICD-10-CM

## 2015-07-18 ENCOUNTER — Ambulatory Visit (INDEPENDENT_AMBULATORY_CARE_PROVIDER_SITE_OTHER): Payer: No Typology Code available for payment source | Admitting: Psychology

## 2015-07-18 DIAGNOSIS — F341 Dysthymic disorder: Secondary | ICD-10-CM | POA: Diagnosis not present

## 2015-07-25 ENCOUNTER — Ambulatory Visit (INDEPENDENT_AMBULATORY_CARE_PROVIDER_SITE_OTHER): Payer: No Typology Code available for payment source | Admitting: Psychology

## 2015-07-25 DIAGNOSIS — F341 Dysthymic disorder: Secondary | ICD-10-CM | POA: Diagnosis not present

## 2015-08-01 ENCOUNTER — Ambulatory Visit (INDEPENDENT_AMBULATORY_CARE_PROVIDER_SITE_OTHER): Payer: No Typology Code available for payment source | Admitting: Psychology

## 2015-08-01 DIAGNOSIS — F341 Dysthymic disorder: Secondary | ICD-10-CM | POA: Diagnosis not present

## 2015-08-08 ENCOUNTER — Ambulatory Visit (INDEPENDENT_AMBULATORY_CARE_PROVIDER_SITE_OTHER): Payer: No Typology Code available for payment source | Admitting: Psychology

## 2015-08-08 ENCOUNTER — Ambulatory Visit (INDEPENDENT_AMBULATORY_CARE_PROVIDER_SITE_OTHER): Payer: Medicare Other | Admitting: Family Medicine

## 2015-08-08 ENCOUNTER — Encounter: Payer: Self-pay | Admitting: Family Medicine

## 2015-08-08 VITALS — BP 120/82 | HR 78 | Temp 98.0°F | Ht 71.0 in | Wt 226.0 lb

## 2015-08-08 DIAGNOSIS — F1020 Alcohol dependence, uncomplicated: Secondary | ICD-10-CM | POA: Diagnosis not present

## 2015-08-08 DIAGNOSIS — F341 Dysthymic disorder: Secondary | ICD-10-CM | POA: Diagnosis not present

## 2015-08-08 DIAGNOSIS — I482 Chronic atrial fibrillation, unspecified: Secondary | ICD-10-CM

## 2015-08-08 DIAGNOSIS — Z0000 Encounter for general adult medical examination without abnormal findings: Secondary | ICD-10-CM | POA: Diagnosis not present

## 2015-08-08 DIAGNOSIS — I4891 Unspecified atrial fibrillation: Secondary | ICD-10-CM

## 2015-08-08 DIAGNOSIS — I10 Essential (primary) hypertension: Secondary | ICD-10-CM

## 2015-08-08 DIAGNOSIS — E785 Hyperlipidemia, unspecified: Secondary | ICD-10-CM | POA: Diagnosis not present

## 2015-08-08 DIAGNOSIS — F332 Major depressive disorder, recurrent severe without psychotic features: Secondary | ICD-10-CM

## 2015-08-08 LAB — COMPREHENSIVE METABOLIC PANEL
ALBUMIN: 4.2 g/dL (ref 3.5–5.2)
ALK PHOS: 49 U/L (ref 39–117)
ALT: 21 U/L (ref 0–53)
AST: 18 U/L (ref 0–37)
BUN: 18 mg/dL (ref 6–23)
CALCIUM: 9.1 mg/dL (ref 8.4–10.5)
CHLORIDE: 103 meq/L (ref 96–112)
CO2: 23 mEq/L (ref 19–32)
Creatinine, Ser: 0.93 mg/dL (ref 0.40–1.50)
GFR: 84.59 mL/min (ref >60.00)
Glucose, Bld: 115 mg/dL — ABNORMAL HIGH (ref 70–99)
POTASSIUM: 4.5 meq/L (ref 3.5–5.1)
SODIUM: 140 meq/L (ref 135–145)
TOTAL PROTEIN: 6.9 g/dL (ref 6.0–8.3)
Total Bilirubin: 0.5 mg/dL (ref 0.2–1.2)

## 2015-08-08 LAB — POC URINALSYSI DIPSTICK (AUTOMATED)
Bilirubin, UA: NEGATIVE
GLUCOSE UA: NEGATIVE
KETONES UA: NEGATIVE
Leukocytes, UA: NEGATIVE
Nitrite, UA: NEGATIVE
Protein, UA: NEGATIVE
RBC UA: NEGATIVE
SPEC GRAV UA: 1.015
UROBILINOGEN UA: 0.2
pH, UA: 6

## 2015-08-08 LAB — CBC
HEMATOCRIT: 42.9 % (ref 39.0–52.0)
HEMOGLOBIN: 14.7 g/dL (ref 13.0–17.0)
MCHC: 34.3 g/dL (ref 30.0–36.0)
MCV: 96 fl (ref 78.0–100.0)
Platelets: 200 10*3/uL (ref 150.0–400.0)
RBC: 4.46 Mil/uL (ref 4.22–5.81)
RDW: 12.6 % (ref 11.5–15.5)
WBC: 8.4 10*3/uL (ref 4.0–10.5)

## 2015-08-08 LAB — LIPID PANEL
CHOL/HDL RATIO: 4
Cholesterol: 178 mg/dL (ref 0–200)
HDL: 40 mg/dL (ref >39.00)
LDL CALC: 112 mg/dL — AB (ref 0–99)
NONHDL: 137.66
Triglycerides: 128 mg/dL (ref 0.0–149.0)
VLDL: 25.6 mg/dL (ref 0.0–40.0)

## 2015-08-08 LAB — TSH: TSH: 3.99 u[IU]/mL (ref 0.35–4.50)

## 2015-08-08 MED ORDER — AMLODIPINE BESYLATE 5 MG PO TABS: ORAL_TABLET | ORAL | Status: DC

## 2015-08-08 MED ORDER — ATENOLOL 50 MG PO TABS: ORAL_TABLET | ORAL | Status: DC

## 2015-08-08 MED ORDER — DABIGATRAN ETEXILATE MESYLATE 150 MG PO CAPS: 150.0000 mg | ORAL_CAPSULE | Freq: Two times a day (BID) | ORAL | Status: DC

## 2015-08-08 MED ORDER — FLUTICASONE PROPIONATE 50 MCG/ACT NA SUSP: NASAL | Status: DC

## 2015-08-08 MED ORDER — OMEPRAZOLE 20 MG PO CPDR: 20.0000 mg | DELAYED_RELEASE_CAPSULE | Freq: Every day | ORAL | Status: DC

## 2015-08-08 NOTE — Patient Instructions (Addendum)
Labs before you go  No change in medication  I would also like for you to sign up for an annual wellness visit on a Friday with our nurse Manuela Schwartz. This is a free benefit under medicare that may help [place] find additional ways to help you.

## 2015-08-08 NOTE — Progress Notes (Signed)
Phone: [phone]  Subjective:  Patient presents today for their annual physical. Chief complaint-noted.   See problem oriented charting- ROS- full  review of systems was completed and negativeincluding chest pain or shortness of breath. No headache or blurry vision.    The following were reviewed and entered/updated in epic: Past Medical History  Diagnosis Date  . COLONIC POLYPS, HX OF 04/05/2007    ONE ADENOMATOUS POLYP AND ONE HYPERPLASTIC POLYP  . DEPRESSION 03/04/2007  . DERMATITIS, SEBORRHEIC 07/19/2007  . FIBRILLATION, ATRIAL 03/04/2007  . HYPERLIPIDEMIA 03/04/2007  . HYPERTENSION 03/04/2007  . Kidney stones    Patient Active Problem List   Diagnosis Date Noted  . Alcoholism ([place]) 06/21/2014    Priority: High  . Atrial fibrillation ([place]) 03/04/2007    Priority: High  . Hyperlipidemia 03/04/2007    Priority: Medium  . Essential hypertension 03/04/2007    Priority: Medium  . Former smoker 06/21/2014    Priority: Low  . Allergic rhinitis 04/06/2014    Priority: Low  . GERD (gastroesophageal reflux disease) 04/06/2014    Priority: Low  . Hx of adenomatous colonic polyps 03/05/2014    Priority: Low  . Chronic anticoagulation 03/05/2014    Priority: Low  . Testosterone deficiency 09/02/2010    Priority: Low  . Seborrheic dermatitis 07/19/2007    Priority: Low  . Major depression ([place]) 03/04/2007    Priority: Low  . Elevated TSH 09/21/2014   Past Surgical History  Procedure Laterality Date  . Tonsillectomy    . Radiofrequency ablation      x2 for fib, on 2nd attempt switched form a flutter ot a fib.   . Colonoscopy    . Rotator cuff surgery      R    Family History  Problem Relation Age of Onset  . Osteoporosis Mother   . Dementia Mother   . Lung cancer Father     former smoker    Medications- reviewed and updated Current Outpatient Prescriptions  Medication Sig Dispense Refill  . amLODipine (NORVASC) 5 MG tablet TAKE 1 BY MOUTH DAILY 90 tablet 3    . atenolol (TENORMIN) 50 MG tablet TAKE 1 BY MOUTH TWICE DAILY 180 tablet 3  . dabigatran (PRADAXA) 150 MG CAPS capsule Take 1 capsule (150 mg total) by mouth every 12 (twelve) hours. 180 capsule 3  . fluticasone (FLONASE) 50 MCG/ACT nasal spray USE TWO SPRAYS IN BOTH NOSTRILS DAILY. 16 g 5  . omeprazole (PRILOSEC) 20 MG capsule Take 1 capsule (20 mg total) by mouth daily. 90 capsule 3  . selenium sulfide (SELSUN) 2.5 % shampoo Apply 1 application topically daily as needed for irritation. (Patient not taking: Reported on 04/05/2015) 118 mL 5  . triamcinolone cream (KENALOG) 0.5 % Apply topically 2 (two) times daily as needed. (Patient not taking: Reported on 04/05/2015) 30 g 1   No current facility-administered medications for this visit.    Allergies-reviewed and updated No Known Allergies  Social History   Social History  . Marital Status: Widowed    Spouse Name: N/A  . Number of Children: 1  . Years of Education: N/A   Occupational History  . retired    Social History Main Topics  . Smoking status: Former Smoker -- 1.50 packs/day for 10 years    Types: Cigarettes    Quit date: 07/08/1977  . Smokeless tobacco: Never Used  . Alcohol Use: 25.2 oz/week    42 Standard drinks or equivalent per week     Comment: 6  scotch's a day  . Drug Use: No  . Sexual Activity: Not Asked   Other Topics Concern  . None   Social History Narrative   Widower-wife died 67. 1 daughter. 2 grandkids. 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       Hobbies: golf, bridge, computer, sports, woodworking    Objective: BP 120/82 mmHg  Pulse 78  Temp(Src) 98 F (36.7 C)  Ht '5\' 11"'$  (1.803 m)  Wt 226 lb (102.513 kg)  BMI 31.53 kg/m2 Gen: NAD, resting comfortably HEENT: Mucous membranes are moist. Oropharynx normal. TM obstructed by cerumen- declines irrigation Neck: no  thyromegaly CV: RRR no murmurs rubs or gallops Lungs: CTAB no crackles, wheeze, rhonchi Abdomen: soft/nontender/nondistended/normal bowel sounds. No rebound or guarding.  Ext: no edema Skin: warm, dry Neuro: grossly normal, moves all extremities, PERRLA  Defers rectal  Assessment/Plan:  75 y.o. male presenting for annual physical.  Health Maintenance counseling: 1. Anticipatory guidance: Patient counseled regarding regular dental exams, eye exams, wearing seatbelts.  2. Risk factor reduction:  Advised patient of need for regular exercise and diet rich and fruits and vegetables to reduce risk of heart attack and stroke. States exercise has been low- he is considering restarting, I encouraged. Weight up 4 lbs- some poor eating choices 3. Immunizations/screenings/ancillary studies Immunization History  Administered Date(s) Administered  . Influenza Split 12/22/2011  . Influenza Whole 01/18/2008, 01/04/2009  . Influenza,inj,Quad PF,36+ Mos 12/23/2012, 02/20/2014, 12/21/2014  . Pneumococcal Conjugate-13 06/21/2014  . Pneumococcal Polysaccharide-23 01/18/2008  . Td 11/21/2001  . Tdap 12/22/2011  . Zoster 01/18/2008   4. Prostate cancer screening- patient opts out unless has urinary change of symptoms  5. Colon cancer screening - adenoma 03/2013 and plan was for 3 year repeat- so repeat nexxt year 6. Skin cancer screening- saw dermatology within last 2 years- 1 seborrheic keratosis on back that had been evaluated by dermatology and no further change in this lesion. Rest of exam without obvious BCC, scc, melanoma.  50. Former smoker- declined AAA screen, get yearly urine  Status of chronic or acute concerns  Atrial fibrillation- no cardiology- chronic. Atenolol controls rate. pradaxa for anticoagulation  Alcoholism and major depression-  S:previously 6 scotches a day, remains the same. Continues to decline SSRI. Continues regular counseling with Dr. Glennon Hamilton- improved control. phq2 of 0. No  SI/HI. A/P: Encouraged AA again declines  Hyperlipidemia: reasonably controlled on no medication last check. No myalgias.  Lab Results  Component Value Date   CHOL 162 06/28/2014   HDL 43.70 06/28/2014   LDLCALC 99 06/28/2014   LDLDIRECT 163.7 04/07/2010   TRIG 98.0 06/28/2014   CHOLHDL 4 06/28/2014   A/P: update lipids today  Hypertension S: controlled. On amlodipine '5mg'$  and atenolol '50mg'$   BP Readings from Last 3 Encounters:  08/08/15 120/82  04/05/15 122/62  12/21/14 120/74  A/P:Continue current meds:  Doing well  Return in about 6 months (around 02/08/2016). Return precautions advised.   Orders Placed This Encounter  Procedures  . CBC    [place]  . Comprehensive metabolic panel    [place]    Order Specific Question:  Has the patient fasted?    Answer:  No  . Lipid panel    [place]    Order Specific Question:  Has the patient fasted?    Answer:  No  . TSH      .  POCT Urinalysis Dipstick (Automated)    Garret Reddish, MD

## 2015-08-12 ENCOUNTER — Telehealth: Payer: Self-pay

## 2015-08-12 NOTE — Telephone Encounter (Signed)
Left message to return call 

## 2015-08-12 NOTE — Telephone Encounter (Signed)
-----   Message from Angela Adam, [place] sent at 08/09/2015  5:05 PM EDT -----   ----- Message -----    From: [name], MD    Sent: 08/08/2015   7:04 PM      To: Angela Adam, CMA  [place] please call unless patient responds to message before noon tomorrow.   Main concern- cholesterol is up. 10 year risk of heart attak of stroke over 20%. Numbers worse than last year- patient can work to try to increase exercise and improve diet and lower numbers over next year - and/or we can call in atorvastatin '10mg'$  once daily to lower cardiac risk here  Also at increased risk for diabetes so weight loss is very important with fasting sugars between 100-125 at 115  Rest of labs normal

## 2015-08-13 NOTE — Telephone Encounter (Signed)
Pt notified of results.  Stated he will "visit the gym more often."  Does not wish to start medication at this time.  Pt will return in 6 months.

## 2015-08-15 ENCOUNTER — Ambulatory Visit (INDEPENDENT_AMBULATORY_CARE_PROVIDER_SITE_OTHER): Payer: No Typology Code available for payment source | Admitting: Psychology

## 2015-08-15 ENCOUNTER — Other Ambulatory Visit: Payer: Self-pay | Admitting: Family Medicine

## 2015-08-15 DIAGNOSIS — F341 Dysthymic disorder: Secondary | ICD-10-CM

## 2015-08-22 ENCOUNTER — Ambulatory Visit: Payer: No Typology Code available for payment source | Admitting: Psychology

## 2015-08-28 ENCOUNTER — Other Ambulatory Visit: Payer: Self-pay

## 2015-08-28 MED ORDER — AMLODIPINE BESYLATE 5 MG PO TABS: 5.0000 mg | ORAL_TABLET | Freq: Every day | ORAL | Status: DC

## 2015-08-29 ENCOUNTER — Ambulatory Visit (INDEPENDENT_AMBULATORY_CARE_PROVIDER_SITE_OTHER): Payer: No Typology Code available for payment source | Admitting: Psychology

## 2015-08-29 DIAGNOSIS — F341 Dysthymic disorder: Secondary | ICD-10-CM

## 2015-09-12 ENCOUNTER — Ambulatory Visit (INDEPENDENT_AMBULATORY_CARE_PROVIDER_SITE_OTHER): Payer: No Typology Code available for payment source | Admitting: Psychology

## 2015-09-12 DIAGNOSIS — F341 Dysthymic disorder: Secondary | ICD-10-CM | POA: Diagnosis not present

## 2015-09-26 ENCOUNTER — Ambulatory Visit (INDEPENDENT_AMBULATORY_CARE_PROVIDER_SITE_OTHER): Payer: No Typology Code available for payment source | Admitting: Psychology

## 2015-09-26 DIAGNOSIS — F341 Dysthymic disorder: Secondary | ICD-10-CM

## 2015-10-10 ENCOUNTER — Ambulatory Visit (INDEPENDENT_AMBULATORY_CARE_PROVIDER_SITE_OTHER): Payer: No Typology Code available for payment source | Admitting: Psychology

## 2015-10-10 DIAGNOSIS — F341 Dysthymic disorder: Secondary | ICD-10-CM | POA: Diagnosis not present

## 2015-10-17 ENCOUNTER — Other Ambulatory Visit: Payer: Self-pay | Admitting: General Practice

## 2015-10-17 ENCOUNTER — Other Ambulatory Visit: Payer: Self-pay | Admitting: Family Medicine

## 2015-10-17 DIAGNOSIS — I4891 Unspecified atrial fibrillation: Secondary | ICD-10-CM

## 2015-10-17 MED ORDER — DABIGATRAN ETEXILATE MESYLATE 150 MG PO CAPS: 150.0000 mg | ORAL_CAPSULE | Freq: Two times a day (BID) | ORAL | Status: DC

## 2015-10-17 MED ORDER — ATENOLOL 50 MG PO TABS: ORAL_TABLET | ORAL | Status: DC

## 2015-10-17 NOTE — Telephone Encounter (Signed)
Pt need refills on atenolo and pradaxa #90 each w/refills send to [facility]

## 2015-10-17 NOTE — Telephone Encounter (Signed)
Rx's sent to pharmacy.  

## 2015-10-24 ENCOUNTER — Ambulatory Visit (INDEPENDENT_AMBULATORY_CARE_PROVIDER_SITE_OTHER): Payer: No Typology Code available for payment source | Admitting: Psychology

## 2015-10-24 DIAGNOSIS — F341 Dysthymic disorder: Secondary | ICD-10-CM

## 2015-11-07 ENCOUNTER — Ambulatory Visit (INDEPENDENT_AMBULATORY_CARE_PROVIDER_SITE_OTHER): Payer: No Typology Code available for payment source | Admitting: Psychology

## 2015-11-07 DIAGNOSIS — F341 Dysthymic disorder: Secondary | ICD-10-CM

## 2015-11-11 ENCOUNTER — Telehealth: Payer: Self-pay | Admitting: Family Medicine

## 2015-11-11 ENCOUNTER — Other Ambulatory Visit: Payer: Self-pay

## 2015-11-11 MED ORDER — ATENOLOL 50 MG PO TABS: ORAL_TABLET | ORAL | 3 refills | Status: DC

## 2015-11-11 NOTE — Telephone Encounter (Signed)
Spoke with patient who stated that [facility] has told him they do have atenolol. Prescription sent there per patients request.

## 2015-11-11 NOTE — Telephone Encounter (Signed)
° °  Pt is req the rx be sent to [facility]   atenolol (TENORMIN) 50 MG tablet

## 2015-11-21 ENCOUNTER — Ambulatory Visit (INDEPENDENT_AMBULATORY_CARE_PROVIDER_SITE_OTHER): Payer: No Typology Code available for payment source | Admitting: Psychology

## 2015-11-21 DIAGNOSIS — F341 Dysthymic disorder: Secondary | ICD-10-CM

## 2015-12-04 ENCOUNTER — Ambulatory Visit (INDEPENDENT_AMBULATORY_CARE_PROVIDER_SITE_OTHER): Payer: No Typology Code available for payment source | Admitting: Psychology

## 2015-12-04 DIAGNOSIS — F341 Dysthymic disorder: Secondary | ICD-10-CM | POA: Diagnosis not present

## 2015-12-19 ENCOUNTER — Ambulatory Visit (INDEPENDENT_AMBULATORY_CARE_PROVIDER_SITE_OTHER): Payer: No Typology Code available for payment source | Admitting: Psychology

## 2015-12-19 DIAGNOSIS — F341 Dysthymic disorder: Secondary | ICD-10-CM | POA: Diagnosis not present

## 2016-01-02 ENCOUNTER — Ambulatory Visit (INDEPENDENT_AMBULATORY_CARE_PROVIDER_SITE_OTHER): Payer: No Typology Code available for payment source | Admitting: Psychology

## 2016-01-02 ENCOUNTER — Ambulatory Visit (INDEPENDENT_AMBULATORY_CARE_PROVIDER_SITE_OTHER): Payer: Medicare Other | Admitting: Family Medicine

## 2016-01-02 DIAGNOSIS — Z23 Encounter for immunization: Secondary | ICD-10-CM | POA: Diagnosis not present

## 2016-01-02 DIAGNOSIS — F341 Dysthymic disorder: Secondary | ICD-10-CM | POA: Diagnosis not present

## 2016-01-16 ENCOUNTER — Ambulatory Visit: Payer: No Typology Code available for payment source | Admitting: Psychology

## 2016-01-22 ENCOUNTER — Other Ambulatory Visit: Payer: Self-pay | Admitting: Family Medicine

## 2016-01-27 ENCOUNTER — Ambulatory Visit (INDEPENDENT_AMBULATORY_CARE_PROVIDER_SITE_OTHER): Payer: No Typology Code available for payment source | Admitting: Psychology

## 2016-01-27 DIAGNOSIS — F341 Dysthymic disorder: Secondary | ICD-10-CM | POA: Diagnosis not present

## 2016-02-11 ENCOUNTER — Encounter: Payer: Self-pay | Admitting: Family Medicine

## 2016-02-11 ENCOUNTER — Ambulatory Visit (INDEPENDENT_AMBULATORY_CARE_PROVIDER_SITE_OTHER): Payer: Medicare Other | Admitting: Family Medicine

## 2016-02-11 VITALS — BP 118/78 | HR 85 | Temp 97.7°F | Ht 71.0 in | Wt 228.5 lb

## 2016-02-11 DIAGNOSIS — R946 Abnormal results of thyroid function studies: Secondary | ICD-10-CM

## 2016-02-11 DIAGNOSIS — I482 Chronic atrial fibrillation, unspecified: Secondary | ICD-10-CM

## 2016-02-11 DIAGNOSIS — E785 Hyperlipidemia, unspecified: Secondary | ICD-10-CM | POA: Diagnosis not present

## 2016-02-11 DIAGNOSIS — F1020 Alcohol dependence, uncomplicated: Secondary | ICD-10-CM

## 2016-02-11 DIAGNOSIS — R7989 Other specified abnormal findings of blood chemistry: Secondary | ICD-10-CM

## 2016-02-11 DIAGNOSIS — I10 Essential (primary) hypertension: Secondary | ICD-10-CM

## 2016-02-11 MED ORDER — AMLODIPINE BESYLATE 5 MG PO TABS: 5.0000 mg | ORAL_TABLET | Freq: Every day | ORAL | 3 refills | Status: DC

## 2016-02-11 NOTE — Progress Notes (Addendum)
Subjective:  Martin Bailey is a 75 y.o. year old very pleasant male patient who presents for/with See problem oriented charting ROS- depressed mood, no SI/HI. No chest pain or shortness of breath. No headache or blurry vision.see any ROS included in HPI as well.   Past Medical History-  Patient Active Problem List   Diagnosis Date Noted  . Alcoholism ([place]) 06/21/2014    Priority: High  . Atrial fibrillation ([place]) 03/04/2007    Priority: High  . Elevated TSH 09/21/2014    Priority: Medium  . Hyperlipidemia 03/04/2007    Priority: Medium  . Major depression ([place]) 03/04/2007    Priority: Medium  . Essential hypertension 03/04/2007    Priority: Medium  . Former smoker 06/21/2014    Priority: Low  . Allergic rhinitis 04/06/2014    Priority: Low  . GERD (gastroesophageal reflux disease) 04/06/2014    Priority: Low  . Hx of adenomatous colonic polyps 03/05/2014    Priority: Low  . Chronic anticoagulation 03/05/2014    Priority: Low  . Testosterone deficiency 09/02/2010    Priority: Low  . Seborrheic dermatitis 07/19/2007    Priority: Low    Medications- reviewed and updated Current Outpatient Prescriptions  Medication Sig Dispense Refill  . amLODipine (NORVASC) 5 MG tablet Take 1 tablet (5 mg total) by mouth daily. 90 tablet 3  . atenolol (TENORMIN) 50 MG tablet TAKE 1 TABLET BY MOUTH TWICE DAILY 180 tablet 1  . dabigatran (PRADAXA) 150 MG CAPS capsule Take 1 capsule (150 mg total) by mouth every 12 (twelve) hours. 180 capsule 1  . fluticasone (FLONASE) 50 MCG/ACT nasal spray USE TWO SPRAYS IN BOTH NOSTRILS DAILY. 16 g 5  . omeprazole (PRILOSEC) 20 MG capsule Take 1 capsule (20 mg total) by mouth daily. 90 capsule 3  . selenium sulfide (SELSUN) 2.5 % shampoo Apply 1 application topically daily as needed for irritation. 118 mL 5  . triamcinolone cream (KENALOG) 0.5 % Apply topically 2 (two) times daily as needed. 30 g 1   No current facility-administered medications for this  visit.     Objective: BP 118/78 (BP Location: Left Arm, Patient Position: Sitting, Cuff Size: Normal)   Pulse 85   Temp 97.7 F (36.5 C) (Oral)   Ht '5\' 11"'$  (1.803 m)   Wt 228 lb 8 oz (103.6 kg)   SpO2 99%   BMI 31.87 kg/m  Gen: NAD, resting comfortably CV: RRR no murmurs rubs or gallops Lungs: CTAB no crackles, wheeze, rhonchi Ext: no edema Skin: warm, dry, no rash  HIP no pain IR or ER or flexion. Some pain with palpation of right greater trochanter but mild  Assessment/Plan:   Elevated TSH Resolved on repeat without treatment Lab Results  Component Value Date   TSH 3.99 08/08/2015  leave on problem list to monitor with labs  Alcoholism S:Alcohol use remains at 6 scotches a day. Advised cessation. Liver seems to be holding up. A/P: advised cessation again. Different angle- Discussed could help with weight loss- uninterested  Atrial fibrillation ([facility]) S:persistent and rate controlled on atenolol. Anticoagulated on pradaxa-does not see cardiolology A/P: remains rate controlled and anticoagulated- no changes  Hyperlipidemia S: poorly controlled on no medication. No myalgias.  Lab Results  Component Value Date   CHOL 178 08/08/2015   HDL 40.00 08/08/2015   LDLCALC 112 (H) 08/08/2015   LDLDIRECT 163.7 04/07/2010   TRIG 128.0 08/08/2015   CHOLHDL 4 08/08/2015   A/P: discussed targeting weight back in 210s - increase  exercise from 3 days a week, cut down on snackin- discussed option for statin which we will consider at follow up  Essential hypertension S: controlled on Amlodipine '5mg'$  , atenolol '50mg'$  BID BP Readings from Last 3 Encounters:  02/11/16 118/78  08/08/15 120/82  04/05/15 122/62  A/P:Continue current meds:  Refilled amlodipine. May be changing mail orders- so asked to send locally and then he will contact us when sure which new pharmacy he is to use   Nausea with indigestion- consider rolaids early with symptoms since this helps. No chest pain, shortness  of breath, left arm or neck pain- knows to be evaluated if this occurs  Ok to use aleve sparingly for the hip pain. May be more bursitis as pain over burs abut none over IR or ER of hip.   Meds ordered this encounter  Medications  . amLODipine (NORVASC) 5 MG tablet    Sig: Take 1 tablet (5 mg total) by mouth daily.    Dispense:  90 tablet    Refill:  3   Return precautions advised.  Garret Reddish, MD

## 2016-02-11 NOTE — Assessment & Plan Note (Signed)
S: poorly controlled on no medication. No myalgias.  Lab Results  Component Value Date   CHOL 178 08/08/2015   HDL 40.00 08/08/2015   LDLCALC 112 (H) 08/08/2015   LDLDIRECT 163.7 04/07/2010   TRIG 128.0 08/08/2015   CHOLHDL 4 08/08/2015   A/P: discussed targeting weight back in 210s - increase exercise from 3 days a week, cut down on snackin- discussed option for statin which we will consider at follow up

## 2016-02-11 NOTE — Patient Instructions (Signed)
As always- advise you to cut down on the scotches to 2 a day max. Consider AA  Blood pressure looks great- refilled amlodipine  Sugar 115 previoualy-increased risk diabetes- another reason to try to get the weight back into 210s at least.   Nausea with indigestion- consider rolaids early with symptoms since this helps  Ok to use aleve sparingly for the hip pain

## 2016-02-11 NOTE — Assessment & Plan Note (Signed)
Resolved on repeat without treatment Lab Results  Component Value Date   TSH 3.99 08/08/2015  leave on problem list to monitor with labs

## 2016-02-11 NOTE — Assessment & Plan Note (Signed)
S:persistent and rate controlled on atenolol. Anticoagulated on pradaxa-does not see cardiolology A/P: remains rate controlled and anticoagulated- no changes

## 2016-02-11 NOTE — Assessment & Plan Note (Signed)
S: controlled on Amlodipine '5mg'$  , atenolol '50mg'$  BID BP Readings from Last 3 Encounters:  02/11/16 118/78  08/08/15 120/82  04/05/15 122/62  A/P:Continue current meds:  Refilled amlodipine. May be changing mail orders- so asked to send locally and then he will contact us when sure which new pharmacy he is to use

## 2016-02-11 NOTE — Assessment & Plan Note (Signed)
S:Alcohol use remains at 6 scotches a day. Advised cessation. Liver seems to be holding up. A/P: advised cessation again. Different angle- Discussed could help with weight loss- uninterested

## 2016-02-11 NOTE — Progress Notes (Signed)
Pre visit review using our clinic review tool, if applicable. No additional management support is needed unless otherwise documented below in the visit note. 

## 2016-02-13 ENCOUNTER — Ambulatory Visit (INDEPENDENT_AMBULATORY_CARE_PROVIDER_SITE_OTHER): Payer: No Typology Code available for payment source | Admitting: Psychology

## 2016-02-13 DIAGNOSIS — F341 Dysthymic disorder: Secondary | ICD-10-CM | POA: Diagnosis not present

## 2016-02-14 ENCOUNTER — Telehealth: Payer: Self-pay

## 2016-02-14 NOTE — Telephone Encounter (Signed)
Received PA form from insurance company for Pradaxa. PA form filled out & faxed back.

## 2016-02-18 ENCOUNTER — Encounter: Payer: Self-pay | Admitting: Family Medicine

## 2016-02-18 DIAGNOSIS — I482 Chronic atrial fibrillation, unspecified: Secondary | ICD-10-CM

## 2016-02-18 NOTE — Telephone Encounter (Signed)
Called and left patient a voicemail message. I am responding via My Chart as patient has sent a message that way

## 2016-02-18 NOTE — Telephone Encounter (Signed)
PA for Pradaxa tier exception denied. All covered medications used to treat the condition in tier 3 (Eliquis, Xarelto) need to be tried first.  OR There has to be a specific medical reason why the lower tired alternative medications are not appropriate to treat your medical condition.

## 2016-02-18 NOTE — Telephone Encounter (Signed)
Can you check with patient- we have option of xarelto which is once a day or eliquis which is twice a day similar to pradaxa he is on- I am happy to change him to either- has he had problems on either of the above in the past?

## 2016-02-25 ENCOUNTER — Encounter: Payer: Self-pay | Admitting: Family Medicine

## 2016-02-25 MED ORDER — RIVAROXABAN 20 MG PO TABS: 20.0000 mg | ORAL_TABLET | Freq: Every day | ORAL | 3 refills | Status: DC

## 2016-02-25 NOTE — Telephone Encounter (Signed)
Please refer to Dr. Paulla Fore- amb refer ortho. He works at [employer]. He can evaluate and provide injection if needed. Please inform patient.

## 2016-02-26 ENCOUNTER — Other Ambulatory Visit: Payer: Self-pay

## 2016-02-26 DIAGNOSIS — M25551 Pain in right hip: Secondary | ICD-10-CM

## 2016-03-06 ENCOUNTER — Encounter (INDEPENDENT_AMBULATORY_CARE_PROVIDER_SITE_OTHER): Payer: Self-pay | Admitting: Orthopaedic Surgery

## 2016-03-06 ENCOUNTER — Ambulatory Visit (INDEPENDENT_AMBULATORY_CARE_PROVIDER_SITE_OTHER): Payer: Medicare Other | Admitting: Orthopaedic Surgery

## 2016-03-06 ENCOUNTER — Ambulatory Visit (INDEPENDENT_AMBULATORY_CARE_PROVIDER_SITE_OTHER): Payer: Self-pay

## 2016-03-06 DIAGNOSIS — M25551 Pain in right hip: Secondary | ICD-10-CM

## 2016-03-06 DIAGNOSIS — M7061 Trochanteric bursitis, right hip: Secondary | ICD-10-CM | POA: Diagnosis not present

## 2016-03-06 NOTE — Progress Notes (Signed)
Office Visit Note   Patient: Martin Bailey           Date of Birth: Feb 15, 1941           MRN: [id number] Visit Date: 03/06/2016              Requested by: [name], MD [address] [place], [place] [address] PCP: Garret Reddish, MD   Assessment & Plan: Visit Diagnoses:  1. Pain in right hip   2. Trochanteric bursitis, right hip     Plan: Impression is right trochanteric bursitis and IT band friction syndrome. Patient does not seem to be overly symptomatic. I recommended scheduled to leave for 2 weeks and then as needed I think this will get better without having to pursue invasive  Follow-Up Instructions: Return if symptoms worsen or fail to improve.   Orders:  Orders Placed This Encounter  Procedures  . XR HIP UNILAT W OR W/O PELVIS 2-3 VIEWS RIGHT   No orders of the defined types were placed in this encounter.     Procedures: No procedures performed   Clinical Data: No additional findings.   Subjective: Chief Complaint  Patient presents with  . Right Hip - Pain    Patient is a 75 year old gentleman who comes in with right hip pain for 1 month that's over the lateral aspect. Aleve has helped tremendously. He denies any groin pain. Pain is only when he gets up from sitting into a standing position. He has taken Aleve regularly for about a week and actually hasn't taken it in about 3 days and is not actually feeling all that bad. The pain does not radiate denies any radicular symptoms. Denies any injuries.    Review of Systems  Constitutional: Negative.   HENT: Negative.   Eyes: Negative.   Respiratory: Negative.   Cardiovascular: Negative.   Gastrointestinal: Negative.   Endocrine: Negative.   Genitourinary: Negative.   Musculoskeletal: Negative.   Skin: Negative.   Allergic/Immunologic: Negative.   Neurological: Negative.   Hematological: Negative.   Psychiatric/Behavioral: Negative.      Objective: Vital Signs: There were no vitals  taken for this visit.  Physical Exam  Constitutional: He is oriented to person, place, and time. He appears well-developed and well-nourished.  HENT:  Head: Normocephalic and atraumatic.  Eyes: EOM are normal.  Neck: Neck supple.  Cardiovascular: Intact distal pulses.   Pulmonary/Chest: Effort normal.  Abdominal: Soft.  Neurological: He is alert and oriented to person, place, and time.  Skin: Skin is warm.  Psychiatric: He has a normal mood and affect. His behavior is normal. Judgment and thought content normal.  Nursing note and vitals reviewed.   Ortho Exam Exam of the right hip shows painless range of motion. Negative Stinchfield sign. Negative straight leg raise test. Pain is localized to the lateral aspect of the hip with palpation. Pain is mild to moderate. Specialty Comments:  No specialty comments available.  Imaging: Xr Hip Unilat W Or W/o Pelvis 2-3 Views Right  Result Date: 03/06/2016 Mild osteoarthritis bilateral hips.    PMFS History: Patient Active Problem List   Diagnosis Date Noted  . Elevated TSH 09/21/2014  . Former smoker 06/21/2014  . Alcoholism ([place]) 06/21/2014  . Allergic rhinitis 04/06/2014  . GERD (gastroesophageal reflux disease) 04/06/2014  . Hx of adenomatous colonic polyps 03/05/2014  . Chronic anticoagulation 03/05/2014  . Testosterone deficiency 09/02/2010  . Seborrheic dermatitis 07/19/2007  . Hyperlipidemia 03/04/2007  . Major depression ([place]) 03/04/2007  .  Essential hypertension 03/04/2007  . Atrial fibrillation ([place]) 03/04/2007   Past Medical History:  Diagnosis Date  . COLONIC POLYPS, HX OF 04/05/2007   ONE ADENOMATOUS POLYP AND ONE HYPERPLASTIC POLYP  . DEPRESSION 03/04/2007  . DERMATITIS, SEBORRHEIC 07/19/2007  . FIBRILLATION, ATRIAL 03/04/2007  . HYPERLIPIDEMIA 03/04/2007  . HYPERTENSION 03/04/2007  . Kidney stones     Family History  Problem Relation Age of Onset  . Osteoporosis Mother   . Dementia Mother   . Lung cancer  Father     former smoker    Past Surgical History:  Procedure Laterality Date  . COLONOSCOPY    . RADIOFREQUENCY ABLATION     x2 for fib, on 2nd attempt switched form a flutter ot a fib.   . rotator cuff surgery     R  . TONSILLECTOMY     Social History   Occupational History  . retired    Social History Main Topics  . Smoking status: Former Smoker    Packs/day: 1.50    Years: 10.00    Types: Cigarettes    Quit date: 07/08/1977  . Smokeless tobacco: Never Used  . Alcohol use 25.2 oz/week    42 Standard drinks or equivalent per week     Comment: 6 scotch's a day  . Drug use: No  . Sexual activity: Not on file

## 2016-04-09 ENCOUNTER — Encounter: Payer: Self-pay | Admitting: Family Medicine

## 2016-04-09 MED ORDER — RIVAROXABAN 20 MG PO TABS: 20.0000 mg | ORAL_TABLET | Freq: Every day | ORAL | 3 refills | Status: DC

## 2016-04-09 NOTE — Telephone Encounter (Signed)
Rx for Xarelto sent to [facility]. Message sent thru My Chart to pt that Rx was sent.

## 2016-04-10 ENCOUNTER — Other Ambulatory Visit: Payer: Self-pay | Admitting: Internal Medicine

## 2016-04-10 MED ORDER — RIVAROXABAN 20 MG PO TABS: 20.0000 mg | ORAL_TABLET | Freq: Every day | ORAL | 3 refills | Status: DC

## 2016-07-02 DIAGNOSIS — H52223 Regular astigmatism, bilateral: Secondary | ICD-10-CM | POA: Diagnosis not present

## 2016-07-02 DIAGNOSIS — H18413 Arcus senilis, bilateral: Secondary | ICD-10-CM | POA: Diagnosis not present

## 2016-07-02 DIAGNOSIS — H5201 Hypermetropia, right eye: Secondary | ICD-10-CM | POA: Diagnosis not present

## 2016-07-02 DIAGNOSIS — H524 Presbyopia: Secondary | ICD-10-CM | POA: Diagnosis not present

## 2016-07-10 ENCOUNTER — Other Ambulatory Visit: Payer: Self-pay

## 2016-07-10 MED ORDER — AMLODIPINE BESYLATE 5 MG PO TABS: 5.0000 mg | ORAL_TABLET | Freq: Every day | ORAL | 3 refills | Status: DC

## 2016-07-10 MED ORDER — ATENOLOL 50 MG PO TABS: 50.0000 mg | ORAL_TABLET | Freq: Two times a day (BID) | ORAL | 1 refills | Status: DC

## 2016-08-13 ENCOUNTER — Encounter: Payer: Self-pay | Admitting: Family Medicine

## 2016-08-13 ENCOUNTER — Ambulatory Visit (INDEPENDENT_AMBULATORY_CARE_PROVIDER_SITE_OTHER): Payer: Medicare Other | Admitting: Family Medicine

## 2016-08-13 ENCOUNTER — Other Ambulatory Visit: Payer: Self-pay

## 2016-08-13 DIAGNOSIS — I482 Chronic atrial fibrillation, unspecified: Secondary | ICD-10-CM

## 2016-08-13 DIAGNOSIS — L219 Seborrheic dermatitis, unspecified: Secondary | ICD-10-CM

## 2016-08-13 DIAGNOSIS — F1020 Alcohol dependence, uncomplicated: Secondary | ICD-10-CM

## 2016-08-13 DIAGNOSIS — K219 Gastro-esophageal reflux disease without esophagitis: Secondary | ICD-10-CM

## 2016-08-13 DIAGNOSIS — R7989 Other specified abnormal findings of blood chemistry: Secondary | ICD-10-CM

## 2016-08-13 DIAGNOSIS — E7800 Pure hypercholesterolemia, unspecified: Secondary | ICD-10-CM | POA: Diagnosis not present

## 2016-08-13 DIAGNOSIS — F332 Major depressive disorder, recurrent severe without psychotic features: Secondary | ICD-10-CM

## 2016-08-13 DIAGNOSIS — R946 Abnormal results of thyroid function studies: Secondary | ICD-10-CM

## 2016-08-13 DIAGNOSIS — I10 Essential (primary) hypertension: Secondary | ICD-10-CM

## 2016-08-13 LAB — LIPID PANEL
Cholesterol: 169 mg/dL (ref 0–200)
HDL: 49.9 mg/dL (ref >39.00)
LDL CALC: 103 mg/dL — AB (ref 0–99)
NONHDL: 119.36
Total CHOL/HDL Ratio: 3
Triglycerides: 82 mg/dL (ref 0.0–149.0)
VLDL: 16.4 mg/dL (ref 0.0–40.0)

## 2016-08-13 LAB — CBC
HEMATOCRIT: 42.6 % (ref 39.0–52.0)
HEMOGLOBIN: 14.9 g/dL (ref 13.0–17.0)
MCHC: 34.9 g/dL (ref 30.0–36.0)
MCV: 95.9 fl (ref 78.0–100.0)
PLATELETS: 214 10*3/uL (ref 150.0–400.0)
RBC: 4.45 Mil/uL (ref 4.22–5.81)
RDW: 13 % (ref 11.5–15.5)
WBC: 6.4 10*3/uL (ref 4.0–10.5)

## 2016-08-13 LAB — COMPREHENSIVE METABOLIC PANEL
ALK PHOS: 50 U/L (ref 39–117)
ALT: 18 U/L (ref 0–53)
AST: 17 U/L (ref 0–37)
Albumin: 4.2 g/dL (ref 3.5–5.2)
BUN: 10 mg/dL (ref 6–23)
CO2: 27 mEq/L (ref 19–32)
CREATININE: 0.99 mg/dL (ref 0.40–1.50)
Calcium: 9 mg/dL (ref 8.4–10.5)
Chloride: 105 mEq/L (ref 96–112)
GFR: 78.48 mL/min (ref >60.00)
Glucose, Bld: 109 mg/dL — ABNORMAL HIGH (ref 70–99)
POTASSIUM: 4.2 meq/L (ref 3.5–5.1)
Sodium: 138 mEq/L (ref 135–145)
Total Bilirubin: 0.6 mg/dL (ref 0.2–1.2)
Total Protein: 7 g/dL (ref 6.0–8.3)

## 2016-08-13 LAB — TSH: TSH: 4.6 u[IU]/mL — AB (ref 0.35–4.50)

## 2016-08-13 MED ORDER — KETOCONAZOLE 2 % EX SHAM: 1.0000 "application " | MEDICATED_SHAMPOO | Freq: Every day | CUTANEOUS | 3 refills | Status: DC

## 2016-08-13 MED ORDER — TRIAMCINOLONE ACETONIDE 0.5 % EX CREA: TOPICAL_CREAM | Freq: Two times a day (BID) | CUTANEOUS | 1 refills | Status: DC | PRN

## 2016-08-13 NOTE — Assessment & Plan Note (Signed)
S: controlled on amlodipine '5mg'$ , atenolol '50mg'$  BID.  BP Readings from Last 3 Encounters:  08/13/16 128/82  02/11/16 118/78  08/08/15 120/82  A/P:Continue current meds:  Well controlled

## 2016-08-13 NOTE — Assessment & Plan Note (Signed)
S: last visit 6 scotches a day--> down to 4-5 now. A/P: advised to continue to cut down, check lfts

## 2016-08-13 NOTE — Assessment & Plan Note (Signed)
S: poorly controlled on no rx. Last visit discussed lifestyle changes. No myalgias. Has lost 10 lbs- still about 8 lbs off goal under 210. 3-4 days a week gym. Improved diet/ less alcohol.  Lab Results  Component Value Date   CHOL 178 08/08/2015   HDL 40.00 08/08/2015   LDLCALC 112 (H) 08/08/2015   LDLDIRECT 163.7 04/07/2010   TRIG 128.0 08/08/2015   CHOLHDL 4 08/08/2015   A/P: update lipids today- hopeful improved with weight loss

## 2016-08-13 NOTE — Assessment & Plan Note (Signed)
S: rate controlled on atenolol. Anticoagulated with xarelto  A/P: continue current medications

## 2016-08-13 NOTE — Assessment & Plan Note (Signed)
Continue selsun blue. Refilled triamcinolone for behind ears. Will trial ketoconazole shampoo and perhaps steroid if not efective.

## 2016-08-13 NOTE — Progress Notes (Signed)
Subjective:  Martin Bailey is a 76 y.o. year old very pleasant male patient who presents for/with See problem oriented charting ROS- depressed mood, no chest pain or shortness of breath. Mild edeam   Past Medical History-  Patient Active Problem List   Diagnosis Date Noted  . Alcoholism ([place]) 06/21/2014    Priority: High  . Atrial fibrillation ([place]) 03/04/2007    Priority: High  . Elevated TSH 09/21/2014    Priority: Medium  . Seborrheic dermatitis 07/19/2007    Priority: Medium  . Hyperlipidemia 03/04/2007    Priority: Medium  . Major depression ([place]) 03/04/2007    Priority: Medium  . Essential hypertension 03/04/2007    Priority: Medium  . Former smoker 06/21/2014    Priority: Low  . Allergic rhinitis 04/06/2014    Priority: Low  . GERD (gastroesophageal reflux disease) 04/06/2014    Priority: Low  . Hx of adenomatous colonic polyps 03/05/2014    Priority: Low  . Chronic anticoagulation 03/05/2014    Priority: Low  . Testosterone deficiency 09/02/2010    Priority: Low    Medications- reviewed and updated Current Outpatient Prescriptions  Medication Sig Dispense Refill  . amLODipine (NORVASC) 5 MG tablet Take 1 tablet (5 mg total) by mouth daily. 90 tablet 3  . atenolol (TENORMIN) 50 MG tablet Take 1 tablet (50 mg total) by mouth 2 (two) times daily. 180 tablet 1  . fluticasone (FLONASE) 50 MCG/ACT nasal spray USE TWO SPRAYS IN BOTH NOSTRILS DAILY. 16 g 5  . rivaroxaban (XARELTO) 20 MG TABS tablet Take 1 tablet (20 mg total) by mouth daily with supper. 90 tablet 3  . selenium sulfide (SELSUN) 2.5 % shampoo Apply 1 application topically daily as needed for irritation. 118 mL 5  . ketoconazole (NIZORAL) 2 % shampoo Apply 1 application topically daily. For up to 3 weeks then space to 2-3x a week 120 mL 3  . triamcinolone cream (KENALOG) 0.5 % Apply topically 2 (two) times daily as needed. 30 g 1   No current facility-administered medications for this visit.      Objective: BP 128/82 (BP Location: Left Arm, Patient Position: Sitting, Cuff Size: Large)   Pulse 74   Temp 98.2 F (36.8 C) (Oral)   Ht '5\' 11"'$  (1.803 m)   Wt 218 lb (98.9 kg)   SpO2 94%   BMI 30.40 kg/m  Gen: NAD, resting comfortably CV: irregularly irregular no murmurs rubs or gallops Lungs: CTAB no crackles, wheeze, rhonchi Abdomen: soft/nontender/nondistended/normal bowel sounds. No rebound or guarding.  Ext: trace edema Skin: warm, dry- white flaking areas behind bilateral ears up into scalp  Assessment/Plan:  Alcoholism S: last visit 6 scotches a day--> down to 4-5 now. A/P: advised to continue to cut down, check lfts  Atrial fibrillation ([facility]) S: rate controlled on atenolol. Anticoagulated with xarelto  A/P: continue current medications  Hyperlipidemia S: poorly controlled on no rx. Last visit discussed lifestyle changes. No myalgias. Has lost 10 lbs- still about 8 lbs off goal under 210. 3-4 days a week gym. Improved diet/ less alcohol.  Lab Results  Component Value Date   CHOL 178 08/08/2015   HDL 40.00 08/08/2015   LDLCALC 112 (H) 08/08/2015   LDLDIRECT 163.7 04/07/2010   TRIG 128.0 08/08/2015   CHOLHDL 4 08/08/2015   A/P: update lipids today- hopeful improved with weight loss   Essential hypertension S: controlled on amlodipine '5mg'$ , atenolol '50mg'$  BID.  BP Readings from Last 3 Encounters:  08/13/16 128/82  02/11/16  118/78  08/08/15 120/82  A/P:Continue current meds:  Well controlled  Major depression ([place]) No improvement on SSRI in past. No improvement with counseling including with Dr. Glennon Hamilton. He does not want to pursue further workup at this time such as psychiatry visit - he is aware this is an option. No SI  Elevated TSH History of elevated tsh- ok last time but will recheck  Seborrheic dermatitis Continue selsun blue. Refilled triamcinolone for behind ears. Will trial ketoconazole shampoo and perhaps steroid if not efective.    Return in  about 6 months (around 02/13/2017) for physical.  Orders Placed This Encounter  Procedures  . CBC    [place]  . Comprehensive metabolic panel    [place]    Order Specific Question:   Has the patient fasted?    Answer:   No  . Lipid panel    [place]    Order Specific Question:   Has the patient fasted?    Answer:   No  . TSH    [place]    Meds ordered this encounter  Medications  . ketoconazole (NIZORAL) 2 % shampoo    Sig: Apply 1 application topically daily. For up to 3 weeks then space to 2-3x a week    Dispense:  120 mL    Refill:  3    Return precautions advised.  Garret Reddish, MD

## 2016-08-13 NOTE — Patient Instructions (Addendum)
Could always consider psychiatry referral as they have additional medication options  Please stop by lab before you go  Trial ketoconazole shampoo daily leaving in 5-10 minutes for 3 weeks then can trial 2-3x a week if improves. If no better in a month let me know

## 2016-08-13 NOTE — Assessment & Plan Note (Signed)
No improvement on SSRI in past. No improvement with counseling including with Dr. Glennon Hamilton. He does not want to pursue further workup at this time such as psychiatry visit - he is aware this is an option. No SI

## 2016-08-13 NOTE — Assessment & Plan Note (Signed)
History of elevated tsh- ok last time but will recheck

## 2016-08-19 ENCOUNTER — Telehealth: Payer: Self-pay | Admitting: Family Medicine

## 2016-08-19 NOTE — Telephone Encounter (Signed)
Noted  

## 2016-08-19 NOTE — Telephone Encounter (Signed)
° ° ° ° ° ° °  Pt returned your call and said if you were calling him about his lab results he saw them on line and read Dr Yong Channel notes he is ok and you do not have to call him

## 2016-10-01 NOTE — Progress Notes (Signed)
Subjective:   Martin Bailey is a 76 y.o. male who presents for an Initial Medicare Annual Wellness Visit.  Review of Systems  No ROS.  Medicare Wellness Visit. Additional risk factors are reflected in the social history.  Cardiac Risk Factors include: advanced age (>71men, >46 women);dyslipidemia;hypertension;male gender   Sleep patterns:  6-7 hrs/night. 1-2 hr nap during day. Wakes up feeling rested. Gets up to use restroom once per night.  Home Safety/Smoke Alarms: Feels safe in home. Smoke alarms in place.  Living environment; residence and Firearm Safety: Split level home. No stair difficulty. Lives alone. Seat Belt Safety/Bike Helmet: Wears seat belt.   Counseling:   Dental- Twice per year.  Male:   CCS- 04/23/2014 3 year recall. PSA-  Lab Results  Component Value Date   PSA 0.44 11/25/2011   PSA 0.38 04/11/2009   PSA 0.38 07/19/2008      Objective:    Today's Vitals   10/06/16 0833  BP: 108/72  Pulse: 74  Resp: 16  SpO2: 96%  Weight: 218 lb 6.4 oz (99.1 kg)  Height: 5\' 11"  (1.803 m)   Body mass index is 30.46 kg/m.  Current Medications (verified) Outpatient Encounter Prescriptions as of 10/06/2016  Medication Sig  . amLODipine (NORVASC) 5 MG tablet Take 1 tablet (5 mg total) by mouth daily.  Marland Kitchen atenolol (TENORMIN) 50 MG tablet Take 1 tablet (50 mg total) by mouth 2 (two) times daily.  . fluticasone (FLONASE) 50 MCG/ACT nasal spray USE TWO SPRAYS IN BOTH NOSTRILS DAILY.  Marland Kitchen ketoconazole (NIZORAL) 2 % shampoo Apply 1 application topically daily. For up to 3 weeks then space to 2-3x a week  . rivaroxaban (XARELTO) 20 MG TABS tablet Take 1 tablet (20 mg total) by mouth daily with supper.  . selenium sulfide (SELSUN) 2.5 % shampoo Apply 1 application topically daily as needed for irritation.  . triamcinolone cream (KENALOG) 0.5 % Apply topically 2 (two) times daily as needed.   No facility-administered encounter medications on file as of 10/06/2016.      Allergies (verified) Patient has no known allergies.   History: Past Medical History:  Diagnosis Date  . COLONIC POLYPS, HX OF 04/05/2007   ONE ADENOMATOUS POLYP AND ONE HYPERPLASTIC POLYP  . DEPRESSION 03/04/2007  . DERMATITIS, SEBORRHEIC 07/19/2007  . FIBRILLATION, ATRIAL 03/04/2007  . HYPERLIPIDEMIA 03/04/2007  . HYPERTENSION 03/04/2007  . Kidney stones    Past Surgical History:  Procedure Laterality Date  . COLONOSCOPY    . RADIOFREQUENCY ABLATION     x2 for fib, on 2nd attempt switched form a flutter ot a fib.   . rotator cuff surgery     R  . TONSILLECTOMY     Family History  Problem Relation Age of Onset  . Osteoporosis Mother   . Dementia Mother   . Lung cancer Father        former smoker   Social History   Occupational History  . retired    Social History Main Topics  . Smoking status: Former Smoker    Packs/day: 1.50    Years: 10.00    Types: Cigarettes    Quit date: 07/08/1977  . Smokeless tobacco: Never Used  . Alcohol use 25.2 oz/week    42 Standard drinks or equivalent per week     Comment: 6 scotch's a day  . Drug use: No  . Sexual activity: Not on file   Tobacco Counseling Counseling given: Not Answered   Activities of Daily Living In  your present state of health, do you have any difficulty performing the following activities: 10/06/2016  Hearing? N  Vision? N  Difficulty concentrating or making decisions? N  Walking or climbing stairs? N  Dressing or bathing? N  Doing errands, shopping? N  Preparing Food and eating ? N  Using the Toilet? N  In the past six months, have you accidently leaked urine? N  Do you have problems with loss of bowel control? N  Managing your Medications? N  Managing your Finances? N  Housekeeping or managing your Housekeeping? N  Some recent data might be hidden    Immunizations and Health Maintenance Immunization History  Administered Date(s) Administered  . Influenza Split 12/22/2011  . Influenza  Whole 01/18/2008, 01/04/2009  . Influenza, High Dose Seasonal PF 01/02/2016  . Influenza,inj,Quad PF,36+ Mos 12/23/2012, 02/20/2014, 12/21/2014  . Pneumococcal Conjugate-13 06/21/2014  . Pneumococcal Polysaccharide-23 01/18/2008  . Td 11/21/2001  . Tdap 12/22/2011  . Zoster 01/18/2008   There are no preventive care reminders to display for this patient.  Patient Care Team: [name], MD as PCP - General (Family Medicine)  Indicate any recent Medical Services you may have received from other than [facility] providers in the past year (date may be approximate).    Assessment:   This is a routine wellness examination for Martin Bailey. Physical assessment deferred to PCP.  Hearing/Vision screen Hearing Screening Comments: Able to hear conversational tones w/o difficulty. Pt has hearing loss but does not wear his hearing aids.  Vision Screening Comments: Last vision check was May 2018. Goes yearly.  Dietary issues and exercise activities discussed: Current Exercise Habits: Structured exercise class, Type of exercise: strength training/weights;treadmill, Time (Minutes): > 60, Frequency (Times/Week): 5, Weekly Exercise (Minutes/Week): 0, Intensity: Moderate, Exercise limited by: None identified  Diet (meal preparation, eat out, water intake, caffeinated beverages, dairy products, fruits and vegetables): 2 cups black coffee.  Pt states he does not drink water. Likes to snack on chips, pretzels, chocolate. All cooked at home. Breakfast: Cereal and juice with berries. Lunch: 1/2 sandwich. Dinner: Meat and two vegetables. Iced tea. Pt states that he has had difficulty loosing weight. I suggested patient read nutrition labels to make sure he is eating the serving size. I also suggested increased water intake. Pt acknowledges this.  Goals    . Maintain current health       Depression Screen PHQ 2/9 Scores 10/06/2016 08/13/2016 08/08/2015 08/08/2015  PHQ - 2 Score 6 1 0 0  PHQ- 9 Score 7 - - -   Pt  states that he is lonely. States since he retired he spends a lot of time alone and he cannot play his instrument like he used to. States he also gave up golf because he had to separate from his golf partner. I suggested finding a volunteer group where he can play his music and socialize. Or asking his bridge group if anyone would like to play golf. Pt states that he is shy and that is why he is in this situation. Denies any ideas of suicide. States he has tried counseling and medications before with no relief. I instructed patient to call the office and make an appointment if he notices his depression getting worse.  Fall Risk Fall Risk  10/06/2016 08/13/2016 08/08/2015 08/08/2015 04/06/2014  Falls in the past year? No No No No No  Pt did state concern over friends falling in their home. I gave patient a printout on preventing falls in the home so  he can do a home assessment.   Cognitive Function:   Ad8 score reviewed for issues:  Issues making decisions:no  Less interest in hobbies / activities:no  Repeats questions, stories (family complaining):no  Trouble using ordinary gadgets (microwave, computer, phone):no  Forgets the month or year: no  Mismanaging finances: no  Remembering appts:no  Daily problems with thinking and/or memory:no Ad8 score is=0      Screening Tests Health Maintenance  Topic Date Due  . INFLUENZA VACCINE  10/28/2016  . COLONOSCOPY  04/23/2017  . TETANUS/TDAP  12/21/2021  . PNA vac Low Risk Adult  Completed      Plan:   Follow up with PCP as directed.  Bring a copy of your advance directives to your next office visit.  Continue doing brain stimulating activities (puzzles, reading, adult coloring books, staying active) to keep memory sharp.   I have personally reviewed and noted the following in the patient's chart:   . Medical and social history . Use of alcohol, tobacco or illicit drugs  . Current medications and supplements . Functional ability and  status . Nutritional status . Physical activity . Advanced directives . List of other physicians . Vitals . Screenings to include cognitive, depression, and falls . Referrals and appointments  In addition, I have reviewed and discussed with patient certain preventive protocols, quality metrics, and best practice recommendations. A written personalized care plan for preventive services as well as general preventive health recommendations were provided to patient.     Ree Edman, RN   10/06/2016

## 2016-10-01 NOTE — Progress Notes (Signed)
Pre visit review using our clinic review tool, if applicable. No additional management support is needed unless otherwise documented below in the visit note. 

## 2016-10-06 ENCOUNTER — Ambulatory Visit (INDEPENDENT_AMBULATORY_CARE_PROVIDER_SITE_OTHER): Payer: Medicare Other

## 2016-10-06 VITALS — BP 108/72 | HR 74 | Resp 16 | Ht 71.0 in | Wt 218.4 lb

## 2016-10-06 DIAGNOSIS — Z0000 Encounter for general adult medical examination without abnormal findings: Secondary | ICD-10-CM

## 2016-10-06 NOTE — Progress Notes (Signed)
I have reviewed and agree with note, evaluation, plan.   Shalon Councilman, MD  

## 2016-10-06 NOTE — Patient Instructions (Signed)
Follow up with PCP as directed.  Bring a copy of your advance directives to your next office visit.  Continue doing brain stimulating activities (puzzles, reading, adult coloring books, staying active) to keep memory sharp.    Preventive Care 2 Years and Older, Male Preventive care refers to lifestyle choices and visits with your health care provider that can promote health and wellness. What does preventive care include?  A yearly physical exam. This is also called an annual well check.  Dental exams once or twice a year.  Routine eye exams. Ask your health care provider how often you should have your eyes checked.  Personal lifestyle choices, including: ? Daily care of your teeth and gums. ? Regular physical activity. ? Eating a healthy diet. ? Avoiding tobacco and drug use. ? Limiting alcohol use. ? Practicing safe sex. ? Taking low doses of aspirin every day. ? Taking vitamin and mineral supplements as recommended by your health care provider. What happens during an annual well check? The services and screenings done by your health care provider during your annual well check will depend on your age, overall health, lifestyle risk factors, and family history of disease. Counseling Your health care provider may ask you questions about your:  Alcohol use.  Tobacco use.  Drug use.  Emotional well-being.  Home and relationship well-being.  Sexual activity.  Eating habits.  History of falls.  Memory and ability to understand (cognition).  Work and work Statistician.  Screening You may have the following tests or measurements:  Height, weight, and BMI.  Blood pressure.  Lipid and cholesterol levels. These may be checked every 5 years, or more frequently if you are over 80 years old.  Skin check.  Lung cancer screening. You may have this screening every year starting at age 77 if you have a 30-pack-year history of smoking and currently smoke or have quit  within the past 15 years.  Fecal occult blood test (FOBT) of the stool. You may have this test every year starting at age 39.  Flexible sigmoidoscopy or colonoscopy. You may have a sigmoidoscopy every 5 years or a colonoscopy every 10 years starting at age 3.  Prostate cancer screening. Recommendations will vary depending on your family history and other risks.  Hepatitis C blood test.  Hepatitis B blood test.  Sexually transmitted disease (STD) testing.  Diabetes screening. This is done by checking your blood sugar (glucose) after you have not eaten for a while (fasting). You may have this done every 1-3 years.  Abdominal aortic aneurysm (AAA) screening. You may need this if you are a current or former smoker.  Osteoporosis. You may be screened starting at age 13 if you are at high risk.  Talk with your health care provider about your test results, treatment options, and if necessary, the need for more tests. Vaccines Your health care provider may recommend certain vaccines, such as:  Influenza vaccine. This is recommended every year.  Tetanus, diphtheria, and acellular pertussis (Tdap, Td) vaccine. You may need a Td booster every 10 years.  Varicella vaccine. You may need this if you have not been vaccinated.  Zoster vaccine. You may need this after age 16.  Measles, mumps, and rubella (MMR) vaccine. You may need at least one dose of MMR if you were born in 1957 or later. You may also need a second dose.  Pneumococcal 13-valent conjugate (PCV13) vaccine. One dose is recommended after age 83.  Pneumococcal polysaccharide (PPSV23) vaccine. One dose is  recommended after age 60.  Meningococcal vaccine. You may need this if you have certain conditions.  Hepatitis A vaccine. You may need this if you have certain conditions or if you travel or work in places where you may be exposed to hepatitis A.  Hepatitis B vaccine. You may need this if you have certain conditions or if you  travel or work in places where you may be exposed to hepatitis B.  Haemophilus influenzae type b (Hib) vaccine. You may need this if you have certain risk factors.  Talk to your health care provider about which screenings and vaccines you need and how often you need them. This information is not intended to replace advice given to you by your health care provider. Make sure you discuss any questions you have with your health care provider. Document Released: 04/12/2015 Document Revised: 12/04/2015 Document Reviewed: 01/15/2015 Elsevier Interactive Patient Education  2017 [employer] Prevention in the Home Falls can cause injuries. They can happen to people of all ages. There are many things you can do to make your home safe and to help prevent falls. What can I do on the outside of my home?  Regularly fix the edges of walkways and driveways and fix any cracks.  Remove anything that might make you trip as you walk through a door, such as a raised step or threshold.  Trim any bushes or trees on the path to your home.  Use bright outdoor lighting.  Clear any walking paths of anything that might make someone trip, such as rocks or tools.  Regularly check to see if handrails are loose or broken. Make sure that both sides of any steps have handrails.  Any raised decks and porches should have guardrails on the edges.  Have any leaves, snow, or ice cleared regularly.  Use sand or salt on walking paths during winter.  Clean up any spills in your garage right away. This includes oil or grease spills. What can I do in the bathroom?  Use night lights.  Install grab bars by the toilet and in the tub and shower. Do not use towel bars as grab bars.  Use non-skid mats or decals in the tub or shower.  If you need to sit down in the shower, use a plastic, non-slip stool.  Keep the floor dry. Clean up any water that spills on the floor as soon as it happens.  Remove soap buildup in  the tub or shower regularly.  Attach bath mats securely with double-sided non-slip rug tape.  Do not have throw rugs and other things on the floor that can make you trip. What can I do in the bedroom?  Use night lights.  Make sure that you have a light by your bed that is easy to reach.  Do not use any sheets or blankets that are too big for your bed. They should not hang down onto the floor.  Have a firm chair that has side arms. You can use this for support while you get dressed.  Do not have throw rugs and other things on the floor that can make you trip. What can I do in the kitchen?  Clean up any spills right away.  Avoid walking on wet floors.  Keep items that you use a lot in easy-to-reach places.  If you need to reach something above you, use a strong step stool that has a grab bar.  Keep electrical cords out of the way.  Do  not use floor polish or wax that makes floors slippery. If you must use wax, use non-skid floor wax.  Do not have throw rugs and other things on the floor that can make you trip. What can I do with my stairs?  Do not leave any items on the stairs.  Make sure that there are handrails on both sides of the stairs and use them. Fix handrails that are broken or loose. Make sure that handrails are as long as the stairways.  Check any carpeting to make sure that it is firmly attached to the stairs. Fix any carpet that is loose or worn.  Avoid having throw rugs at the top or bottom of the stairs. If you do have throw rugs, attach them to the floor with carpet tape.  Make sure that you have a light switch at the top of the stairs and the bottom of the stairs. If you do not have them, ask someone to add them for you. What else can I do to help prevent falls?  Wear shoes that: ? Do not have high heels. ? Have rubber bottoms. ? Are comfortable and fit you well. ? Are closed at the toe. Do not wear sandals.  If you use a stepladder: ? Make sure that  it is fully opened. Do not climb a closed stepladder. ? Make sure that both sides of the stepladder are locked into place. ? Ask someone to hold it for you, if possible.  Clearly mark and make sure that you can see: ? Any grab bars or handrails. ? First and last steps. ? Where the edge of each step is.  Use tools that help you move around (mobility aids) if they are needed. These include: ? Canes. ? Walkers. ? Scooters. ? Crutches.  Turn on the lights when you go into a dark area. Replace any light bulbs as soon as they burn out.  Set up your furniture so you have a clear path. Avoid moving your furniture around.  If any of your floors are uneven, fix them.  If there are any pets around you, be aware of where they are.  Review your medicines with your doctor. Some medicines can make you feel dizzy. This can increase your chance of falling. Ask your doctor what other things that you can do to help prevent falls. This information is not intended to replace advice given to you by your health care provider. Make sure you discuss any questions you have with your health care provider. Document Released: 01/10/2009 Document Revised: 08/22/2015 Document Reviewed: 04/20/2014 Elsevier Interactive Patient Education  [employer].

## 2016-10-15 ENCOUNTER — Other Ambulatory Visit: Payer: Self-pay

## 2016-10-15 DIAGNOSIS — D2361 Other benign neoplasm of skin of right upper limb, including shoulder: Secondary | ICD-10-CM | POA: Diagnosis not present

## 2016-10-15 DIAGNOSIS — H61002 Unspecified perichondritis of left external ear: Secondary | ICD-10-CM | POA: Diagnosis not present

## 2016-10-15 DIAGNOSIS — L570 Actinic keratosis: Secondary | ICD-10-CM | POA: Diagnosis not present

## 2016-10-15 DIAGNOSIS — L409 Psoriasis, unspecified: Secondary | ICD-10-CM | POA: Diagnosis not present

## 2016-10-15 DIAGNOSIS — D492 Neoplasm of unspecified behavior of bone, soft tissue, and skin: Secondary | ICD-10-CM | POA: Diagnosis not present

## 2016-11-13 ENCOUNTER — Other Ambulatory Visit: Payer: Self-pay | Admitting: Family Medicine

## 2017-01-21 ENCOUNTER — Ambulatory Visit: Payer: Medicare Other | Admitting: Family Medicine

## 2017-01-21 ENCOUNTER — Ambulatory Visit (INDEPENDENT_AMBULATORY_CARE_PROVIDER_SITE_OTHER): Payer: Medicare Other | Admitting: Family Medicine

## 2017-01-21 ENCOUNTER — Encounter: Payer: Self-pay | Admitting: Family Medicine

## 2017-01-21 VITALS — BP 104/78 | HR 70 | Temp 97.4°F | Ht 71.0 in | Wt 204.0 lb

## 2017-01-21 DIAGNOSIS — Z23 Encounter for immunization: Secondary | ICD-10-CM

## 2017-01-21 DIAGNOSIS — L600 Ingrowing nail: Secondary | ICD-10-CM | POA: Diagnosis not present

## 2017-01-21 NOTE — Patient Instructions (Signed)
Use warm soaks for at least 15 minutes a day   Then lift up the nail slightly for about a minute  Repeat on daily basis for 1 month  Do not cut the nail except straight across  If not better in a month- send me a mychart message and I will refer you to podiatry

## 2017-01-21 NOTE — Progress Notes (Signed)
Subjective:  Martin Bailey is a 76 y.o. year old very pleasant male patient who presents for/with See problem oriented charting ROS- no fever, chills, no redness around nail. Some pain when nail pushed on (left great toenail) but otherwise no pain   Past Medical History-  Patient Active Problem List   Diagnosis Date Noted  . Alcoholism ([place]) 06/21/2014    Priority: High  . Atrial fibrillation ([place]) 03/04/2007    Priority: High  . Elevated TSH 09/21/2014    Priority: Medium  . Seborrheic dermatitis 07/19/2007    Priority: Medium  . Hyperlipidemia 03/04/2007    Priority: Medium  . Major depression ([place]) 03/04/2007    Priority: Medium  . Essential hypertension 03/04/2007    Priority: Medium  . Former smoker 06/21/2014    Priority: Low  . Allergic rhinitis 04/06/2014    Priority: Low  . GERD (gastroesophageal reflux disease) 04/06/2014    Priority: Low  . Hx of adenomatous colonic polyps 03/05/2014    Priority: Low  . Chronic anticoagulation 03/05/2014    Priority: Low  . Testosterone deficiency 09/02/2010    Priority: Low    Medications- reviewed and updated Current Outpatient Prescriptions  Medication Sig Dispense Refill  . amLODipine (NORVASC) 5 MG tablet Take 1 tablet (5 mg total) by mouth daily. 90 tablet 3  . atenolol (TENORMIN) 50 MG tablet TAKE 1 TABLET BY MOUTH TWO  TIMES DAILY 180 tablet 1  . fluticasone (FLONASE) 50 MCG/ACT nasal spray USE TWO SPRAYS IN BOTH NOSTRILS DAILY. 16 g 5  . ketoconazole (NIZORAL) 2 % shampoo Apply 1 application topically daily. For up to 3 weeks then space to 2-3x a week 120 mL 3  . rivaroxaban (XARELTO) 20 MG TABS tablet Take 1 tablet (20 mg total) by mouth daily with supper. 90 tablet 3  . selenium sulfide (SELSUN) 2.5 % shampoo Apply 1 application topically daily as needed for irritation. 118 mL 5  . triamcinolone cream (KENALOG) 0.5 % Apply topically 2 (two) times daily as needed. 30 g 1   No current facility-administered medications  for this visit.     Objective: BP 104/78 (BP Location: Left Arm, Patient Position: Sitting, Cuff Size: Large)   Pulse 70   Temp (!) 97.4 F (36.3 C) (Oral)   Ht 5\' 11"  (1.803 m)   Wt 204 lb (92.5 kg)   SpO2 97%   BMI 28.45 kg/m  Gen: NAD, resting comfortably Ext: no edema Left great toenail with some pain to palpation at medial distal nail. Also some pain with palpation of tissues just around the nail.   Assessment/Plan:  Left great ingrown toenail S: Intermittent issues with ingrown toenail for years. In last month feels like it has been slightly worse- states tries to cut it to help but has only gotten worse. Pain only with pushing on toenail or on tissues just around medial edge of toenail. Pain mild to moderate. No treatments tried other than cutting nail. No redness, swelling of tissue around nail A/P: Ingrown toenail- we discussed conservative care as below Patient Instructions  Use warm soaks for at least 15 minutes a day   Then lift up the nail slightly for about a minute  Repeat on daily basis for 1 month  Do not cut the nail except straight across  If not better in a month- send me a mychart message and I will refer you to podiatry  Has lost 14 lbs! Congratulated on weight loss. Eating well- exercising regularly  Future Appointments Date Time Provider [place]  02/09/2017 8:15 AM [name], MD [facility] None  10/07/2017 8:00 AM Williemae Area, RN [facility] None   Orders Placed This Encounter  Procedures  . Flu vaccine HIGH DOSE PF   Return precautions advised.  Garret Reddish, MD

## 2017-02-09 ENCOUNTER — Encounter: Payer: Self-pay | Admitting: Family Medicine

## 2017-02-09 ENCOUNTER — Ambulatory Visit (INDEPENDENT_AMBULATORY_CARE_PROVIDER_SITE_OTHER): Payer: Medicare Other | Admitting: Family Medicine

## 2017-02-09 VITALS — BP 104/68 | HR 68 | Temp 98.5°F | Ht 71.0 in | Wt 205.2 lb

## 2017-02-09 DIAGNOSIS — E785 Hyperlipidemia, unspecified: Secondary | ICD-10-CM

## 2017-02-09 DIAGNOSIS — I482 Chronic atrial fibrillation, unspecified: Secondary | ICD-10-CM

## 2017-02-09 DIAGNOSIS — R7989 Other specified abnormal findings of blood chemistry: Secondary | ICD-10-CM | POA: Diagnosis not present

## 2017-02-09 DIAGNOSIS — I10 Essential (primary) hypertension: Secondary | ICD-10-CM

## 2017-02-09 DIAGNOSIS — Z0000 Encounter for general adult medical examination without abnormal findings: Secondary | ICD-10-CM | POA: Diagnosis not present

## 2017-02-09 DIAGNOSIS — Z87891 Personal history of nicotine dependence: Secondary | ICD-10-CM

## 2017-02-09 LAB — POC URINALSYSI DIPSTICK (AUTOMATED)
Bilirubin, UA: NEGATIVE
Blood, UA: NEGATIVE
GLUCOSE UA: NEGATIVE
KETONES UA: NEGATIVE
Leukocytes, UA: NEGATIVE
Nitrite, UA: NEGATIVE
Protein, UA: NEGATIVE
SPEC GRAV UA: 1.015 (ref 1.010–1.025)
Urobilinogen, UA: 0.2 E.U./dL
pH, UA: 6 (ref 5.0–8.0)

## 2017-02-09 LAB — COMPREHENSIVE METABOLIC PANEL
ALBUMIN: 4.1 g/dL (ref 3.5–5.2)
ALT: 15 U/L (ref 0–53)
AST: 18 U/L (ref 0–37)
Alkaline Phosphatase: 52 U/L (ref 39–117)
BUN: 12 mg/dL (ref 6–23)
CHLORIDE: 102 meq/L (ref 96–112)
CO2: 29 meq/L (ref 19–32)
CREATININE: 0.93 mg/dL (ref 0.40–1.50)
Calcium: 9.1 mg/dL (ref 8.4–10.5)
GFR: 84.24 mL/min (ref >60.00)
GLUCOSE: 107 mg/dL — AB (ref 70–99)
Potassium: 4.8 mEq/L (ref 3.5–5.1)
SODIUM: 136 meq/L (ref 135–145)
Total Bilirubin: 0.5 mg/dL (ref 0.2–1.2)
Total Protein: 6.8 g/dL (ref 6.0–8.3)

## 2017-02-09 LAB — CBC
HCT: 43.5 % (ref 39.0–52.0)
Hemoglobin: 14.4 g/dL (ref 13.0–17.0)
MCHC: 33.2 g/dL (ref 30.0–36.0)
MCV: 98.8 fl (ref 78.0–100.0)
Platelets: 192 10*3/uL (ref 150.0–400.0)
RBC: 4.4 Mil/uL (ref 4.22–5.81)
RDW: 12.8 % (ref 11.5–15.5)
WBC: 7.6 10*3/uL (ref 4.0–10.5)

## 2017-02-09 LAB — TSH: TSH: 4.9 u[IU]/mL — AB (ref 0.35–4.50)

## 2017-02-09 LAB — LDL CHOLESTEROL, DIRECT: Direct LDL: 91 mg/dL

## 2017-02-09 NOTE — Progress Notes (Signed)
Phone: [phone]  Subjective:  Patient presents today for their annual physical. Chief complaint-noted.   See problem oriented charting- ROS- full  review of systems was completed and negative except for: depressed mood and anhedonia as per baseline  The following were reviewed and entered/updated in epic: Past Medical History:  Diagnosis Date  . COLONIC POLYPS, HX OF 04/05/2007   ONE ADENOMATOUS POLYP AND ONE HYPERPLASTIC POLYP  . DEPRESSION 03/04/2007  . DERMATITIS, SEBORRHEIC 07/19/2007  . FIBRILLATION, ATRIAL 03/04/2007  . HYPERLIPIDEMIA 03/04/2007  . HYPERTENSION 03/04/2007  . Kidney stones    Patient Active Problem List   Diagnosis Date Noted  . Alcoholism ([place]) 06/21/2014    Priority: High  . Atrial fibrillation ([place]) 03/04/2007    Priority: High  . Elevated TSH 09/21/2014    Priority: Medium  . Seborrheic dermatitis 07/19/2007    Priority: Medium  . Hyperlipidemia 03/04/2007    Priority: Medium  . Major depression ([place]) 03/04/2007    Priority: Medium  . Essential hypertension 03/04/2007    Priority: Medium  . Former smoker 06/21/2014    Priority: Low  . Allergic rhinitis 04/06/2014    Priority: Low  . GERD (gastroesophageal reflux disease) 04/06/2014    Priority: Low  . Hx of adenomatous colonic polyps 03/05/2014    Priority: Low  . Chronic anticoagulation 03/05/2014    Priority: Low  . Testosterone deficiency 09/02/2010    Priority: Low   Past Surgical History:  Procedure Laterality Date  . COLONOSCOPY    . RADIOFREQUENCY ABLATION     x2 for fib, on 2nd attempt switched form a flutter ot a fib.   . rotator cuff surgery     R  . TONSILLECTOMY      Family History  Problem Relation Age of Onset  . Osteoporosis Mother   . Dementia Mother   . Lung cancer Father        former smoker    Medications- reviewed and updated Current Outpatient Medications  Medication Sig Dispense Refill  . amLODipine (NORVASC) 5 MG tablet Take 1 tablet (5 mg total)  by mouth daily. 90 tablet 3  . atenolol (TENORMIN) 50 MG tablet TAKE 1 TABLET BY MOUTH TWO  TIMES DAILY 180 tablet 1  . fluticasone (FLONASE) 50 MCG/ACT nasal spray USE TWO SPRAYS IN BOTH NOSTRILS DAILY. 16 g 5  . ketoconazole (NIZORAL) 2 % shampoo Apply 1 application topically daily. For up to 3 weeks then space to 2-3x a week 120 mL 3  . rivaroxaban (XARELTO) 20 MG TABS tablet Take 1 tablet (20 mg total) by mouth daily with supper. 90 tablet 3  . selenium sulfide (SELSUN) 2.5 % shampoo Apply 1 application topically daily as needed for irritation. 118 mL 5  . triamcinolone cream (KENALOG) 0.5 % Apply topically 2 (two) times daily as needed. 30 g 1   No current facility-administered medications for this visit.     Allergies-reviewed and updated No Known Allergies  Social History   Socioeconomic History  . Marital status: Widowed    Spouse name: Not on file  . Number of children: 1  . Years of education: Not on file  . Highest education level: Not on file  Social Needs  . Financial resource strain: Not on file  . Food insecurity - worry: Not on file  . Food insecurity - inability: Not on file  . Transportation needs - medical: Not on file  . Transportation needs - non-medical: Not on file  Occupational History  .  Occupation: retired  Tobacco Use  . Smoking status: Former Smoker    Packs/day: 1.50    Years: 10.00    Pack years: 15.00    Types: Cigarettes    Last attempt to quit: 07/08/1977    Years since quitting: 39.6  . Smokeless tobacco: Never Used  Substance and Sexual Activity  . Alcohol use: Yes    Alcohol/week: 25.2 oz    Types: 42 Standard drinks or equivalent per week    Comment: 6 scotch's a day  . Drug use: No  . Sexual activity: Not on file  Other Topics Concern  . Not on file  Social History Narrative   Widower-wife died 74. 1 daughter. 2 grandkids. 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       Hobbies: golf, bridge, computer, sports, woodworking    Objective: BP 104/68 (BP Location: Left Arm, Patient Position: Sitting, Cuff Size: Large)   Pulse 68   Temp 98.5 F (36.9 C) (Oral)   Ht 5\' 11"  (1.803 m)   Wt 205 lb 3.2 oz (93.1 kg)   SpO2 98%   BMI 28.62 kg/m  Gen: NAD, resting comfortably HEENT: Mucous membranes are moist. Oropharynx normal Neck: no thyromegaly CV: RRR no murmurs rubs or gallops Lungs: CTAB no crackles, wheeze, rhonchi Abdomen: soft/nontender/nondistended/normal bowel sounds. No rebound or guarding.  Ext: no edema Skin: warm, dry Neuro: grossly normal, moves all extremities, PERRLA  Assessment/Plan:  76 y.o. male presenting for annual physical.  Health Maintenance counseling: 1. Anticipatory guidance: Patient counseled regarding regular dental exams -q6 months, eye exams - yearly, wearing seatbelts.  2. Risk factor reduction:  Advised patient of need for regular exercise and diet rich and fruits and vegetables to reduce risk of heart attack and stroke. Exercise- regular at gym and with healthier eating- his goal was 195 and states he got within 1 lb naked. Overall still improved.   Wt Readings from Last 3 Encounters:  02/09/17 205 lb 3.2 oz (93.1 kg)  01/21/17 204 lb (92.5 kg)  10/06/16 218 lb 6.4 oz (99.1 kg)  3. Immunizations/screenings/ancillary studies- discussed shingrix at pharmacy Immunization History  Administered Date(s) Administered  . Influenza Split 12/22/2011  . Influenza Whole 01/18/2008, 01/04/2009  . Influenza, High Dose Seasonal PF 01/02/2016, 01/21/2017  . Influenza,inj,Quad PF,6+ Mos 12/23/2012, 02/20/2014, 12/21/2014  . Pneumococcal Conjugate-13 06/21/2014  . Pneumococcal Polysaccharide-23 01/18/2008  . Td 11/21/2001  . Tdap 12/22/2011  . Zoster 01/18/2008  4. Prostate cancer screening- passed age based screening. No change in urinary symptoms  5. Colon cancer  screening - 04/23/14 so will be due in January as 3 years advised.  6. Skin cancer screening- saw dermatology a few months ago. advised regular sunscreen use- not out much. Denies worrisome, changing, or new skin lesions.  7. Former smoker- declines AAA screen, get yearly urine   Status of chronic or acute concerns   Left great Ingrown toenail- seen 3 weeks ago and has done soaks and lifting toenail and seems to be helping. Can refer to podiatry if needed.   Alcoholism- declines AA again. Is at least meeting with Dr. Glennon Hamilton of psychology. He is drinking 4 scotches a day down from 6. Monitor LFTs  Depression- continues. Had been in in counseling with Dr. Glennon Hamilton but states limited benefit- has stopped at this pint. He once again declines psychiatry visit. No  SI. PHQ9 of 6 today. Struggles to go out and meet people and declines opportunities I presented. Has tried multiple medications and not helpful.   Elevated TSH- mild at times- doubt cause of depression but would have lower threshold to treat if trending up Lab Results  Component Value Date   TSH 4.60 (H) 08/13/2016   Seborrheic dermatitis- on selsun blue.Ketoconazole shampoo has not helped well. Cream through dermatology is very helpful with flares  Hyperlipidemia HLD- full lipid panel in may- will update direct LDL with hopes of being under 100 with continued weight loss. He has reached goal of being under 210 weight wise.   Atrial fibrillation ([facility]) A. FIb- persistent. rate controlled on atenolol still. On anticoagulation with xarelto- no change  Essential hypertension HTN- controlled on amlodipine 5mg  and atenolol 50mg  BID    Future Appointments  Date Time Provider [place]  02/09/2017  8:15 AM [name], MD [facility] None  10/07/2017  8:00 AM Williemae Area, RN [facility] None   No Follow-up on file.  No orders of the defined types were placed in this encounter.   No orders of the defined types were placed  in this encounter.   Return precautions advised.  Garret Reddish, MD

## 2017-02-09 NOTE — Assessment & Plan Note (Signed)
HTN- controlled on amlodipine 5mg  and atenolol 50mg  BID

## 2017-02-09 NOTE — Patient Instructions (Signed)
No changes today

## 2017-02-09 NOTE — Assessment & Plan Note (Signed)
HLD- full lipid panel in may- will update direct LDL with hopes of being under 100 with continued weight loss. He has reached goal of being under 210 weight wise.

## 2017-02-09 NOTE — Addendum Note (Signed)
Addended by: Frutoso Chase A on: 02/09/2017 09:12 AM   Modules accepted: Orders

## 2017-02-09 NOTE — Assessment & Plan Note (Signed)
A. FIb- persistent. rate controlled on atenolol still. On anticoagulation with xarelto- no change

## 2017-02-10 ENCOUNTER — Telehealth: Payer: Self-pay | Admitting: Family Medicine

## 2017-02-10 NOTE — Telephone Encounter (Signed)
Noted  

## 2017-02-10 NOTE — Telephone Encounter (Signed)
Patient returning missed call from [place] lab results. Says he does not have any questions or concerns regarding them and appreciates the call.

## 2017-03-06 ENCOUNTER — Other Ambulatory Visit: Payer: Self-pay | Admitting: Family Medicine

## 2017-04-08 ENCOUNTER — Encounter: Payer: Self-pay | Admitting: Family Medicine

## 2017-05-04 ENCOUNTER — Encounter: Payer: Self-pay | Admitting: Gastroenterology

## 2017-06-05 ENCOUNTER — Other Ambulatory Visit: Payer: Self-pay | Admitting: Family Medicine

## 2017-06-22 ENCOUNTER — Telehealth: Payer: Self-pay

## 2017-06-22 ENCOUNTER — Encounter: Payer: Self-pay | Admitting: Gastroenterology

## 2017-06-22 ENCOUNTER — Ambulatory Visit: Payer: Medicare Other | Admitting: Gastroenterology

## 2017-06-22 VITALS — BP 104/60 | HR 80 | Ht 70.0 in | Wt 206.2 lb

## 2017-06-22 DIAGNOSIS — I482 Chronic atrial fibrillation, unspecified: Secondary | ICD-10-CM

## 2017-06-22 DIAGNOSIS — Z86010 Personal history of colonic polyps: Secondary | ICD-10-CM | POA: Diagnosis not present

## 2017-06-22 DIAGNOSIS — Z7901 Long term (current) use of anticoagulants: Secondary | ICD-10-CM

## 2017-06-22 MED ORDER — PEG-KCL-NACL-NASULF-NA ASC-C 140 G PO SOLR: 140.0000 g | ORAL | 0 refills | Status: DC

## 2017-06-22 NOTE — Progress Notes (Signed)
[facility] Consult Note:  History: Martin Bailey 06/22/2017  Referring physician: [name], MD  Reason for consult/chief complaint: hx of colon polyps (on Xarelto)   Subjective  HPI:  This is a pleasant 77 year old man here for history of colon polyps.  In January 2016 he underwent colonoscopy with [name], at which time 1 hyperplastic and 4 subcentimeter adenomatous polyps were removed.  Martin Bailey denies abdominal pain, rectal bleeding or change in bowel habits.  He also remains on oral anticoagulation for his atrial fibrillation. He denies any chronic upper digestive symptoms such as nausea vomiting or dysphagia.  His appetite is good and his weight stable.  ROS:  Review of Systems Denies chest pain, dyspnea or dysuria   Past Medical History: Past Medical History:  Diagnosis Date  . COLONIC POLYPS, HX OF 04/05/2007   ONE ADENOMATOUS POLYP AND ONE HYPERPLASTIC POLYP  . DEPRESSION 03/04/2007  . DERMATITIS, SEBORRHEIC 07/19/2007  . FIBRILLATION, ATRIAL 03/04/2007  . HYPERLIPIDEMIA 03/04/2007  . HYPERTENSION 03/04/2007  . Kidney stones      Past Surgical History: Past Surgical History:  Procedure Laterality Date  . COLONOSCOPY    . RADIOFREQUENCY ABLATION     x2 for fib, on 2nd attempt switched form a flutter ot a fib.   Marland Kitchen ROTATOR CUFF REPAIR Right   . TONSILLECTOMY       Family History: Family History  Problem Relation Age of Onset  . Osteoporosis Mother   . Dementia Mother   . Lung cancer Father        former smoker   No CRC  Social History: Social History   Socioeconomic History  . Marital status: Widowed    Spouse name: Not on file  . Number of children: 1  . Years of education: Not on file  . Highest education level: Not on file  Occupational History  . Occupation: retired  Scientific laboratory technician  . Financial resource strain: Not on file  . Food insecurity:    Worry: Not on file    Inability: Not on file  . Transportation  needs:    Medical: Not on file    Non-medical: Not on file  Tobacco Use  . Smoking status: Former Smoker    Packs/day: 1.50    Years: 10.00    Pack years: 15.00    Types: Cigarettes    Last attempt to quit: 07/08/1977    Years since quitting: 39.9  . Smokeless tobacco: Never Used  Substance and Sexual Activity  . Alcohol use: Yes    Alcohol/week: 25.2 oz    Types: 42 Standard drinks or equivalent per week    Comment: 6 scotch's a day  . Drug use: No  . Sexual activity: Not on file  Lifestyle  . Physical activity:    Days per week: Not on file    Minutes per session: Not on file  . Stress: Not on file  Relationships  . Social connections:    Talks on phone: Not on file    Gets together: Not on file    Attends religious service: Not on file    Active member of club or organization: Not on file    Attends meetings of clubs or organizations: Not on file    Relationship status: Not on file  Other Topics Concern  . Not on file  Social History Narrative   Widower-wife died 34. 1 daughter. 2 grandkids. 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 amLODipine (NORVASC) 5 MG tablet TAKE 1 TABLET BY MOUTH  DAILY 90 tablet 3  . atenolol (TENORMIN) 50 MG tablet TAKE 1 TABLET BY MOUTH TWO  TIMES DAILY 180 tablet 1  . fluticasone (FLONASE) 50 MCG/ACT nasal spray USE TWO SPRAYS IN BOTH NOSTRILS DAILY. 16 g 5  . ketoconazole (NIZORAL) 2 % shampoo Apply 1 application topically daily. For up to 3 weeks then space to 2-3x a week 120 mL 3  . rivaroxaban (XARELTO) 20 MG TABS tablet Take 1 tablet (20 mg total) by mouth daily with supper.  90 tablet 3  . selenium sulfide (SELSUN) 2.5 % shampoo Apply 1 application topically daily as needed for irritation. 118 mL 5  . triamcinolone cream (KENALOG) 0.5 % Apply topically 2 (two) times daily as needed. 30 g 1  . XARELTO 20 MG TABS tablet TAKE 1 TABLET BY MOUTH  DAILY WITH SUPPER 90 tablet 3  . PEG-KCl-NaCl-NaSulf-Na Asc-C (PLENVU) 140 g SOLR Take 140 g by mouth as directed. 1 each 0   No current facility-administered medications for this visit.       ___________________________________________________________________ Objective   Exam:  BP 104/60 (BP Location: Left Arm, Patient Position: Sitting, Cuff Size: Normal)   Pulse 80   Ht 5\' 10"  (1.778 m) Comment: height measured without shoes  Wt 206 lb 4 oz (93.6 kg)   BMI 29.59 kg/m    General: this is a(n) well-appearing man  Eyes: sclera anicteric, no redness  ENT: oral mucosa moist without lesions, no cervical or supraclavicular lymphadenopathy, good dentition  CV: Irregular without murmur, S1/S2, no JVD, no peripheral edema  Resp: clear to auscultation bilaterally, normal RR and effort noted  GI: soft, no tenderness, with active bowel sounds. No guarding or palpable organomegaly noted.  Skin; warm and dry, no rash or jaundice noted  Neuro: awake, alert and oriented x 3. Normal gross motor function and fluent speech  Labs:  CBC Latest Ref Rng & Units 02/09/2017 08/13/2016 08/08/2015  WBC 4.0 - 10.5 K/uL 7.6 6.4 8.4  Hemoglobin 13.0 - 17.0 g/dL 14.4 14.9 14.7  Hematocrit 39.0 - 52.0 % 43.5 42.6 42.9  Platelets 150.0 - 400.0 K/uL 192.0 214.0 200.0   Assessment: Encounter Diagnoses  Name Primary?  . Personal history of colonic polyps Yes  . Chronic anticoagulation   . Chronic atrial fibrillation (HCC)     In need of a surveillance colonoscopy.  He will be off [place] 2 days prior, which we will clear with his primary care physician who manages this medicine.  Martin Bailey reports that he has not been to see cardiology  for years.  He is agreeable to a colonoscopy after reviewing the procedure and its risks.  The benefits and risks of the planned procedure were described in detail with the patient or (when appropriate) their health care proxy.  Risks were outlined as including, but not limited to, bleeding, infection, perforation, adverse medication reaction leading to cardiac or pulmonary decompensation, or pancreatitis (if ERCP).  The limitation of incomplete mucosal visualization was also discussed.  No guarantees or warranties were given.  He understands the small but potential risk of cerebrovascular event while off  anticoagulation.  Thank you for the courtesy of this consult.  Please call me with any questions or concerns.  Nelida Meuse III  CC: [name], MD

## 2017-06-22 NOTE — Telephone Encounter (Signed)
  06/22/2017   RE: BRAEDAN MEUTH DOB: 25-Dec-1940 MRN: [id number]   Dear [name],    We have scheduled the above patient for an endoscopic procedure (colonoscopy). Our records show that he is on anticoagulation therapy.   Please advise as to how long the patient may come off his therapy of Xarelto prior to the procedure, which is scheduled for 07-27-2017.  Please  route back to [facility].   Sincerely,   Wilfrid Lund, MD

## 2017-06-22 NOTE — Patient Instructions (Addendum)
If you are age 75 or older, your body mass index should be between 23-30. Your Body mass index is 29.59 kg/m. If this is out of the aforementioned range listed, please consider follow up with your Primary Care Provider.  If you are age 3 or younger, your body mass index should be between 19-25. Your Body mass index is 29.59 kg/m. If this is out of the aformentioned range listed, please consider follow up with your Primary Care Provider.   You have been scheduled for a colonoscopy. Please follow written instructions given to you at your visit today.  Please pick up your prep supplies at the pharmacy within the next 1-3 days. If you use inhalers (even only as needed), please bring them with you on the day of your procedure. Your physician has requested that you go to www.startemmi.com and enter the access code given to you at your visit today. This web site gives a general overview about your procedure. However, you should still follow specific instructions given to you by our office regarding your preparation for the procedure.  You will be contacted by our office prior to your procedure for directions on holding your Xarelto.  If you do not hear from our office 1 week prior to your scheduled procedure, please call [phone] to discuss.   Thank you for choosing [facility]  Dr Wilfrid Lund III

## 2017-06-22 NOTE — Telephone Encounter (Signed)
Yes thanks may hold 3 days prior to the procedure

## 2017-06-23 NOTE — Telephone Encounter (Signed)
Pt has been notified and aware. He states clear understanding to hold his Xarelto 2 days prior to his colonoscopy scheduled for 07-27-2017.

## 2017-06-25 ENCOUNTER — Telehealth: Payer: Self-pay | Admitting: Gastroenterology

## 2017-06-25 NOTE — Telephone Encounter (Signed)
Can you help this patient with his Plenvu, not sure if we have samples of this or not. Thank you.

## 2017-06-25 NOTE — Telephone Encounter (Signed)
Medicare does not cover Plenvu. Pt wants to now if he can have an alternative. Please call him.

## 2017-06-25 NOTE — Telephone Encounter (Signed)
Patient aware to come and pick up a free sample prep.

## 2017-06-29 DIAGNOSIS — M25512 Pain in left shoulder: Secondary | ICD-10-CM | POA: Diagnosis not present

## 2017-07-01 DIAGNOSIS — M25512 Pain in left shoulder: Secondary | ICD-10-CM | POA: Diagnosis not present

## 2017-07-01 DIAGNOSIS — S46012D Strain of muscle(s) and tendon(s) of the rotator cuff of left shoulder, subsequent encounter: Secondary | ICD-10-CM | POA: Diagnosis not present

## 2017-07-01 DIAGNOSIS — M6281 Muscle weakness (generalized): Secondary | ICD-10-CM | POA: Diagnosis not present

## 2017-07-01 DIAGNOSIS — M25612 Stiffness of left shoulder, not elsewhere classified: Secondary | ICD-10-CM | POA: Diagnosis not present

## 2017-07-06 DIAGNOSIS — S46012D Strain of muscle(s) and tendon(s) of the rotator cuff of left shoulder, subsequent encounter: Secondary | ICD-10-CM | POA: Diagnosis not present

## 2017-07-06 DIAGNOSIS — M6281 Muscle weakness (generalized): Secondary | ICD-10-CM | POA: Diagnosis not present

## 2017-07-06 DIAGNOSIS — M25512 Pain in left shoulder: Secondary | ICD-10-CM | POA: Diagnosis not present

## 2017-07-06 DIAGNOSIS — M25612 Stiffness of left shoulder, not elsewhere classified: Secondary | ICD-10-CM | POA: Diagnosis not present

## 2017-07-09 DIAGNOSIS — S46012D Strain of muscle(s) and tendon(s) of the rotator cuff of left shoulder, subsequent encounter: Secondary | ICD-10-CM | POA: Diagnosis not present

## 2017-07-09 DIAGNOSIS — M25612 Stiffness of left shoulder, not elsewhere classified: Secondary | ICD-10-CM | POA: Diagnosis not present

## 2017-07-09 DIAGNOSIS — M25512 Pain in left shoulder: Secondary | ICD-10-CM | POA: Diagnosis not present

## 2017-07-09 DIAGNOSIS — M6281 Muscle weakness (generalized): Secondary | ICD-10-CM | POA: Diagnosis not present

## 2017-07-12 DIAGNOSIS — S46012D Strain of muscle(s) and tendon(s) of the rotator cuff of left shoulder, subsequent encounter: Secondary | ICD-10-CM | POA: Diagnosis not present

## 2017-07-12 DIAGNOSIS — M25612 Stiffness of left shoulder, not elsewhere classified: Secondary | ICD-10-CM | POA: Diagnosis not present

## 2017-07-12 DIAGNOSIS — M25512 Pain in left shoulder: Secondary | ICD-10-CM | POA: Diagnosis not present

## 2017-07-12 DIAGNOSIS — M6281 Muscle weakness (generalized): Secondary | ICD-10-CM | POA: Diagnosis not present

## 2017-07-13 ENCOUNTER — Encounter: Payer: Self-pay | Admitting: Gastroenterology

## 2017-07-15 DIAGNOSIS — M25612 Stiffness of left shoulder, not elsewhere classified: Secondary | ICD-10-CM | POA: Diagnosis not present

## 2017-07-15 DIAGNOSIS — M6281 Muscle weakness (generalized): Secondary | ICD-10-CM | POA: Diagnosis not present

## 2017-07-15 DIAGNOSIS — M25512 Pain in left shoulder: Secondary | ICD-10-CM | POA: Diagnosis not present

## 2017-07-15 DIAGNOSIS — S46012D Strain of muscle(s) and tendon(s) of the rotator cuff of left shoulder, subsequent encounter: Secondary | ICD-10-CM | POA: Diagnosis not present

## 2017-07-19 DIAGNOSIS — M25612 Stiffness of left shoulder, not elsewhere classified: Secondary | ICD-10-CM | POA: Diagnosis not present

## 2017-07-19 DIAGNOSIS — M6281 Muscle weakness (generalized): Secondary | ICD-10-CM | POA: Diagnosis not present

## 2017-07-19 DIAGNOSIS — M25512 Pain in left shoulder: Secondary | ICD-10-CM | POA: Diagnosis not present

## 2017-07-19 DIAGNOSIS — S46012D Strain of muscle(s) and tendon(s) of the rotator cuff of left shoulder, subsequent encounter: Secondary | ICD-10-CM | POA: Diagnosis not present

## 2017-07-22 DIAGNOSIS — S46012D Strain of muscle(s) and tendon(s) of the rotator cuff of left shoulder, subsequent encounter: Secondary | ICD-10-CM | POA: Diagnosis not present

## 2017-07-22 DIAGNOSIS — M25512 Pain in left shoulder: Secondary | ICD-10-CM | POA: Diagnosis not present

## 2017-07-22 DIAGNOSIS — M6281 Muscle weakness (generalized): Secondary | ICD-10-CM | POA: Diagnosis not present

## 2017-07-22 DIAGNOSIS — M25612 Stiffness of left shoulder, not elsewhere classified: Secondary | ICD-10-CM | POA: Diagnosis not present

## 2017-07-26 DIAGNOSIS — M6281 Muscle weakness (generalized): Secondary | ICD-10-CM | POA: Diagnosis not present

## 2017-07-26 DIAGNOSIS — M25612 Stiffness of left shoulder, not elsewhere classified: Secondary | ICD-10-CM | POA: Diagnosis not present

## 2017-07-26 DIAGNOSIS — S46012D Strain of muscle(s) and tendon(s) of the rotator cuff of left shoulder, subsequent encounter: Secondary | ICD-10-CM | POA: Diagnosis not present

## 2017-07-26 DIAGNOSIS — M25512 Pain in left shoulder: Secondary | ICD-10-CM | POA: Diagnosis not present

## 2017-07-27 ENCOUNTER — Other Ambulatory Visit: Payer: Self-pay

## 2017-07-27 ENCOUNTER — Encounter: Payer: Self-pay | Admitting: Gastroenterology

## 2017-07-27 ENCOUNTER — Ambulatory Visit (AMBULATORY_SURGERY_CENTER): Payer: Medicare Other | Admitting: Gastroenterology

## 2017-07-27 VITALS — BP 91/73 | HR 82 | Temp 97.1°F | Resp 16 | Ht 70.0 in | Wt 206.0 lb

## 2017-07-27 DIAGNOSIS — D120 Benign neoplasm of cecum: Secondary | ICD-10-CM | POA: Diagnosis not present

## 2017-07-27 DIAGNOSIS — D123 Benign neoplasm of transverse colon: Secondary | ICD-10-CM

## 2017-07-27 DIAGNOSIS — Z86010 Personal history of colonic polyps: Secondary | ICD-10-CM

## 2017-07-27 DIAGNOSIS — D122 Benign neoplasm of ascending colon: Secondary | ICD-10-CM

## 2017-07-27 MED ORDER — SODIUM CHLORIDE 0.9 % IV SOLN: 500.0000 mL | Freq: Once | INTRAVENOUS | Status: DC

## 2017-07-27 NOTE — Patient Instructions (Signed)
Discharge instructions given. Handouts on polyps and hemorrhoids. Resume Xarelto at prior dose tomorrow. Await results of pathology. YOU HAD AN ENDOSCOPIC PROCEDURE TODAY AT [facility]:   Refer to the procedure report that was given to you for any specific questions about what was found during the examination.  If the procedure report does not answer your questions, please call your gastroenterologist to clarify.  If you requested that your care partner not be given the details of your procedure findings, then the procedure report has been included in a sealed envelope for you to review at your convenience later.  YOU SHOULD EXPECT: Some feelings of bloating in the abdomen. Passage of more gas than usual.  Walking can help get rid of the air that was put into your GI tract during the procedure and reduce the bloating. If you had a lower endoscopy (such as a colonoscopy or flexible sigmoidoscopy) you may notice spotting of blood in your stool or on the toilet paper. If you underwent a bowel prep for your procedure, you may not have a normal bowel movement for a few days.  Please Note:  You might notice some irritation and congestion in your nose or some drainage.  This is from the oxygen used during your procedure.  There is no need for concern and it should clear up in a day or so.  SYMPTOMS TO REPORT IMMEDIATELY:   Following lower endoscopy (colonoscopy or flexible sigmoidoscopy):  Excessive amounts of blood in the stool  Significant tenderness or worsening of abdominal pains  Swelling of the abdomen that is new, acute  Fever of 100F or higher  For urgent or emergent issues, a gastroenterologist can be reached at any hour by calling [phone].   DIET:  We do recommend a small meal at first, but then you may proceed to your regular diet.  Drink plenty of fluids but you should avoid alcoholic beverages for 24 hours.  ACTIVITY:  You should plan to take it easy for the  rest of today and you should NOT DRIVE or use heavy machinery until tomorrow (because of the sedation medicines used during the test).    FOLLOW UP: Our staff will call the number listed on your records the next business day following your procedure to check on you and address any questions or concerns that you may have regarding the information given to you following your procedure. If we do not reach you, we will leave a message.  However, if you are feeling well and you are not experiencing any problems, there is no need to return our call.  We will assume that you have returned to your regular daily activities without incident.  If any biopsies were taken you will be contacted by phone or by letter within the next 1-3 weeks.  Please call us at [phone] if you have not heard about the biopsies in 3 weeks.    SIGNATURES/CONFIDENTIALITY: You and/or your care partner have signed paperwork which will be entered into your electronic medical record.  These signatures attest to the fact that that the information above on your After Visit Summary has been reviewed and is understood.  Full responsibility of the confidentiality of this discharge information lies with you and/or your care-partner.

## 2017-07-27 NOTE — Progress Notes (Signed)
Called to room to assist during endoscopic procedure.  Patient ID and intended procedure confirmed with present staff. Received instructions for my participation in the procedure from the performing physician.  

## 2017-07-27 NOTE — Progress Notes (Signed)
Spontaneous respirations throughout. VSS. Resting comfortably. To PACU on room air. Report to  RN. 

## 2017-07-27 NOTE — Op Note (Signed)
[facility] Patient Name: Martin Bailey Procedure Date: 07/27/2017 2:29 PM MRN: [id number] Endoscopist: [place]. Loletha Carrow , MD Age: 77 Referring MD:  Date of Birth: Sep 30, 1940 Gender: Male Account #: [id number] Procedure:                Colonoscopy Indications:              Surveillance: Personal history of adenomatous                            polyps on last colonoscopy > 3 years ago (4 TA                            polyps , each < 66mm, 03/2014) Medicines:                Monitored Anesthesia Care Procedure:                Pre-Anesthesia Assessment:                           - Prior to the procedure, a History and Physical                            was performed, and patient medications and                            allergies were reviewed. The patient's tolerance of                            previous anesthesia was also reviewed. The risks                            and benefits of the procedure and the sedation                            options and risks were discussed with the patient.                            All questions were answered, and informed consent                            was obtained. Prior Anticoagulants: The patient has                            taken Xarelto (rivaroxaban), last dose was 3 days                            prior to procedure. ASA Grade Assessment: III - A                            patient with severe systemic disease. After                            reviewing the risks and benefits, the patient was  deemed in satisfactory condition to undergo the                            procedure.                           After obtaining informed consent, the colonoscope                            was passed under direct vision. Throughout the                            procedure, the patient's blood pressure, pulse, and                            oxygen saturations were monitored continuously. The     Colonoscope was introduced through the anus and                            advanced to the the terminal ileum. The colonoscopy                            was performed without difficulty. The patient                            tolerated the procedure well. The quality of the                            bowel preparation was good. The ileocecal valve,                            appendiceal orifice, and rectum were photographed.                            The quality of the bowel preparation was evaluated                            using the BBPS [facility] Bowel Preparation Scale)                            with scores of: Right Colon = 2, Transverse Colon =                            2 and Left Colon = 2. The total BBPS score equals 6. Scope In: 2:32:22 PM Scope Out: 3:00:22 PM Scope Withdrawal Time: 0 hours 22 minutes 59 seconds  Total Procedure Duration: 0 hours 28 minutes 0 seconds  Findings:                 The perianal and digital rectal examinations were                            normal.  A 3 mm polyp was found in the cecum. The polyp was                            sessile. The polyp was removed with a cold biopsy                            forceps. Resection and retrieval were complete.                           A 10-12 mm probable polyp was found in the                            ileocecal valve. The polyp was flat and difficult                            to completely visualize, wrapping into the ICV.                            Biopsies were taken with a cold forceps for                            histology.                           A 4 mm polyp was found in the proximal ascending                            colon. The polyp was sessile. The polyp was removed                            with a cold snare. Resection and retrieval were                            complete.                           A 2 mm polyp was found in the distal ascending                             colon. The polyp was flat. The polyp was removed                            with a cold biopsy forceps. Resection and retrieval                            were complete.                           A 8 mm polyp was found in the hepatic flexure. The                            polyp was sessile with a mucous cap (probable SSP  by appearance). The polyp was removed with a cold                            snare. Resection and retrieval were complete.                           A 20 mm polyp was found in the distal transverse                            colon. The polyp was sessile and wrapping over a                            fold. The polyp was adenomatous-appearing by WL and                            NBI. It was not biopsied. Area was successfully                            injected with 1 mL Spot (carbon black) for                            tattooing.                           Internal hemorrhoids were found. The hemorrhoids                            were small and Grade I (internal hemorrhoids that                            do not prolapse).                           The exam was otherwise without abnormality on                            direct and retroflexion views. Complications:            No immediate complications. Estimated Blood Loss:     Estimated blood loss was minimal. Impression:               - One 3 mm polyp in the cecum, removed with a cold                            biopsy forceps. Resected and retrieved.                           - One 10 mm polyp at the ileocecal valve. Biopsied.                           - One 4 mm polyp in the proximal ascending colon,                            removed with a cold snare. Resected and  retrieved.                           - One 2 mm polyp in the distal ascending colon,                            removed with a cold biopsy forceps. Resected and                            retrieved.                            - One 8 mm polyp at the hepatic flexure, removed                            with a cold snare. Resected and retrieved.                           - One 20 mm polyp in the distal transverse colon.                            Injected.                           - Internal hemorrhoids.                           - The examination was otherwise normal on direct                            and retroflexion views. Recommendation:           - Patient has a contact number available for                            emergencies. The signs and symptoms of potential                            delayed complications were discussed with the                            patient. Return to normal activities tomorrow.                            Written discharge instructions were provided to the                            patient.                           - Resume previous diet.                           - Resume Xarelto (rivaroxaban) at prior dose                            tomorrow.                           -  Await pathology results.                           - Repeat colonoscopy is recommended for                            polypectomy. The colonoscopy date will be                            determined after pathology results from today's                            exam become available for review. Lift technique                            required for large distal transverse colon polyp.                            If ICV tissue is adenomatous, it will require                            [facility] to perform EMR given its challenging                            location. Tate Jerkins L. Loletha Carrow, MD 07/27/2017 3:16:25 PM This report has been signed electronically.

## 2017-07-28 ENCOUNTER — Telehealth: Payer: Self-pay | Admitting: *Deleted

## 2017-07-28 NOTE — Telephone Encounter (Signed)
  Follow up Call-  Call back number 07/27/2017  Post procedure Call Back phone  # [phone]  Permission to leave phone message Yes  Some recent data might be hidden     Patient questions:  Do you have a fever, pain , or abdominal swelling? No. Pain Score  0 *  Have you tolerated food without any problems? Yes.    Have you been able to return to your normal activities? Yes.    Do you have any questions about your discharge instructions: Diet   No. Medications  No. Follow up visit  No.  Do you have questions or concerns about your Care? No.  Actions: * If pain score is 4 or above: No action needed, pain <4.

## 2017-07-29 DIAGNOSIS — M25612 Stiffness of left shoulder, not elsewhere classified: Secondary | ICD-10-CM | POA: Diagnosis not present

## 2017-07-29 DIAGNOSIS — S46012D Strain of muscle(s) and tendon(s) of the rotator cuff of left shoulder, subsequent encounter: Secondary | ICD-10-CM | POA: Diagnosis not present

## 2017-07-29 DIAGNOSIS — M25512 Pain in left shoulder: Secondary | ICD-10-CM | POA: Diagnosis not present

## 2017-07-29 DIAGNOSIS — M6281 Muscle weakness (generalized): Secondary | ICD-10-CM | POA: Diagnosis not present

## 2017-08-02 DIAGNOSIS — M25512 Pain in left shoulder: Secondary | ICD-10-CM | POA: Diagnosis not present

## 2017-08-02 DIAGNOSIS — M25612 Stiffness of left shoulder, not elsewhere classified: Secondary | ICD-10-CM | POA: Diagnosis not present

## 2017-08-02 DIAGNOSIS — M6281 Muscle weakness (generalized): Secondary | ICD-10-CM | POA: Diagnosis not present

## 2017-08-02 DIAGNOSIS — S46012D Strain of muscle(s) and tendon(s) of the rotator cuff of left shoulder, subsequent encounter: Secondary | ICD-10-CM | POA: Diagnosis not present

## 2017-08-05 DIAGNOSIS — M25612 Stiffness of left shoulder, not elsewhere classified: Secondary | ICD-10-CM | POA: Diagnosis not present

## 2017-08-05 DIAGNOSIS — M6281 Muscle weakness (generalized): Secondary | ICD-10-CM | POA: Diagnosis not present

## 2017-08-05 DIAGNOSIS — M25512 Pain in left shoulder: Secondary | ICD-10-CM | POA: Diagnosis not present

## 2017-08-05 DIAGNOSIS — S46012D Strain of muscle(s) and tendon(s) of the rotator cuff of left shoulder, subsequent encounter: Secondary | ICD-10-CM | POA: Diagnosis not present

## 2017-08-09 ENCOUNTER — Encounter: Payer: Self-pay | Admitting: Family Medicine

## 2017-08-09 ENCOUNTER — Ambulatory Visit: Payer: Medicare Other | Admitting: Family Medicine

## 2017-08-09 VITALS — BP 110/78 | HR 67 | Temp 98.1°F | Ht 70.0 in | Wt 203.8 lb

## 2017-08-09 DIAGNOSIS — F1020 Alcohol dependence, uncomplicated: Secondary | ICD-10-CM

## 2017-08-09 DIAGNOSIS — R7989 Other specified abnormal findings of blood chemistry: Secondary | ICD-10-CM

## 2017-08-09 DIAGNOSIS — M6281 Muscle weakness (generalized): Secondary | ICD-10-CM | POA: Diagnosis not present

## 2017-08-09 DIAGNOSIS — F330 Major depressive disorder, recurrent, mild: Secondary | ICD-10-CM

## 2017-08-09 DIAGNOSIS — I482 Chronic atrial fibrillation, unspecified: Secondary | ICD-10-CM

## 2017-08-09 DIAGNOSIS — S46012D Strain of muscle(s) and tendon(s) of the rotator cuff of left shoulder, subsequent encounter: Secondary | ICD-10-CM | POA: Diagnosis not present

## 2017-08-09 DIAGNOSIS — E663 Overweight: Secondary | ICD-10-CM | POA: Diagnosis not present

## 2017-08-09 DIAGNOSIS — E785 Hyperlipidemia, unspecified: Secondary | ICD-10-CM

## 2017-08-09 DIAGNOSIS — M25512 Pain in left shoulder: Secondary | ICD-10-CM | POA: Diagnosis not present

## 2017-08-09 DIAGNOSIS — I10 Essential (primary) hypertension: Secondary | ICD-10-CM

## 2017-08-09 DIAGNOSIS — M25612 Stiffness of left shoulder, not elsewhere classified: Secondary | ICD-10-CM | POA: Diagnosis not present

## 2017-08-09 NOTE — Assessment & Plan Note (Signed)
S: Last visit had mildly elevated TSH.  He is not on any medication.  He denies any new symptoms- depression far predates TSH elevation.  Would have lower threshold to treat if this trends up though. Lab Results  Component Value Date   TSH 4.90 (H) 02/09/2017  A/P: we planned to trend again today- likely only treat if worsening.  After discussion he would prefer to hold off until physical

## 2017-08-09 NOTE — Patient Instructions (Addendum)
No changes today

## 2017-08-09 NOTE — Assessment & Plan Note (Signed)
Peak weight 232 in  2016.  Down to 203 in 2019.  I. I congratulated him on efforts

## 2017-08-09 NOTE — Progress Notes (Signed)
Subjective:  Martin Bailey is a 77 y.o. year old very pleasant male patient who presents for/with See problem oriented charting ROS- No chest pain or shortness of breath. No headache or blurry vision.  Some anhedonia and depressed mood.  No SI.  Past Medical History-  Patient Active Problem List   Diagnosis Date Noted  . Alcoholism ([place]) 06/21/2014    Priority: High  . Atrial fibrillation ([place]) 03/04/2007    Priority: High  . Elevated TSH 09/21/2014    Priority: Medium  . Seborrheic dermatitis 07/19/2007    Priority: Medium  . Hyperlipidemia 03/04/2007    Priority: Medium  . Major depression ([place]) 03/04/2007    Priority: Medium  . Essential hypertension 03/04/2007    Priority: Medium  . Former smoker 06/21/2014    Priority: Low  . Allergic rhinitis 04/06/2014    Priority: Low  . GERD (gastroesophageal reflux disease) 04/06/2014    Priority: Low  . Hx of adenomatous colonic polyps 03/05/2014    Priority: Low  . Chronic anticoagulation 03/05/2014    Priority: Low  . Testosterone deficiency 09/02/2010    Priority: Low    Medications- reviewed and updated Current Outpatient Medications  Medication Sig Dispense Refill  . amLODipine (NORVASC) 5 MG tablet TAKE 1 TABLET BY MOUTH  DAILY 90 tablet 3  . atenolol (TENORMIN) 50 MG tablet TAKE 1 TABLET BY MOUTH TWO  TIMES DAILY 180 tablet 1  . fluticasone (FLONASE) 50 MCG/ACT nasal spray USE TWO SPRAYS IN BOTH NOSTRILS DAILY. 16 g 5  . ketoconazole (NIZORAL) 2 % shampoo Apply 1 application topically daily. For up to 3 weeks then space to 2-3x a week 120 mL 3  . rivaroxaban (XARELTO) 20 MG TABS tablet Take 1 tablet (20 mg total) by mouth daily with supper. 90 tablet 3  . selenium sulfide (SELSUN) 2.5 % shampoo Apply 1 application topically daily as needed for irritation. 118 mL 5  . triamcinolone cream (KENALOG) 0.5 % Apply topically 2 (two) times daily as needed. 30 g 1   No current facility-administered medications for this visit.      Objective: BP 110/78 (BP Location: Left Arm, Patient Position: Sitting, Cuff Size: Normal)   Pulse 67   Temp 98.1 F (36.7 C) (Oral)   Ht 5\' 10"  (1.778 m)   Wt 203 lb 12.8 oz (92.4 kg)   SpO2 98%   BMI 29.24 kg/m  Gen: NAD, resting comfortably CV: RRR no murmurs rubs or gallops Lungs: CTAB no crackles, wheeze, rhonchi Abdomen: soft/nontender/nondistended/overweight but improved Ext: no edema Skin: warm, dry Neuro: Normal gait and speech  Assessment/Plan:  other notes: 1.  Left great ingrown toenail-much improved last visit- starting to bother him just a bit- may restart treatments of lifting nail/soaks 2.  I thanked him for updating his colonoscopy.  He had adenomatous colon polyps again. 3. In PT with [place] [facility] on [address] - he is improving for both shoulders L > R. Has already had an injection.   Atrial fibrillation  s: Persistent atrial fibrillation.  Rate controlled on atenolol.  Anticoagulated with Xarelto A/P: Continue current medication  Hypertension  s: controlled on amlodipine 5 mg and atenolol 50 mg BP Readings from Last 3 Encounters:  08/09/17 110/78  07/27/17 91/73  06/22/17 104/60  A/P: We discussed blood pressure goal of <140/90. Continue current meds   Alcoholism ([place]) S: Continues to drink at least 4 scotches a day.  He declines AA once again.  At least this is  a decrease from his 6 scotches a day.  Has not had LFT elevation A/P: encouraged complete cessation- he declines. Encouraged further moderation- he declines. He actually prefers for me to not ask about AA-he considers drinking 1 of his few pleasures  Hyperlipidemia S: Reasonably controlled on no statin.  Last visit he had gotten to his personal goal weight of 210 or less-LDL responded to this and was at 91 A/P: continue without meds- has done a good job maintaining weight loss  Major depression ([place]) S: He reports feeling better overall- more due to acceptance of potential for  lifelong issues. Biggest issue is not being able to play the music he loves Depression screen [facility] 2/9 08/09/2017  Decreased Interest 1  Down, Depressed, Hopeless 1  PHQ - 2 Score 2  Altered sleeping 0  Tired, decreased energy 0  Change in appetite 0  Feeling bad or failure about yourself  1  Trouble concentrating 0  Moving slowly or fidgety/restless 0  Suicidal thoughts 0  PHQ-9 Score 3  Difficult doing work/chores -  A/P: Mild depression- he has some control from prior severe depression due to acceptance-though I wonder if his counseling did help.  Will continue to monitor and make sure no suicidal ideation.  He declines trials of SSRI or other therapy.  He declines counseling.  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   Elevated TSH S: Last visit had mildly elevated TSH.  He is not on any medication.  He denies any new symptoms- depression far predates TSH elevation.  Would have lower threshold to treat if this trends up though. Lab Results  Component Value Date   TSH 4.90 (H) 02/09/2017  A/P: we planned to trend again today- likely only treat if worsening.  After discussion he would prefer to hold off until physical   Future Appointments  Date Time Provider [place]  10/07/2017  8:00 AM Williemae Area, RN [facility]  02/10/2018  8:45 AM [name], MD [facility]   Return in about 6 months (around 02/09/2018) for physical, come fasting.  Return precautions advised.  Garret Reddish, MD

## 2017-08-09 NOTE — Assessment & Plan Note (Addendum)
S: Continues to drink at least 4 scotches a day.  He declines AA once again.  At least this is a decrease from his 6 scotches a day.  Has not had LFT elevation A/P: encouraged complete cessation- he declines. Encouraged further moderation- he declines. He actually prefers for me to not ask about AA-he considers drinking 1 of his few pleasures

## 2017-08-09 NOTE — Assessment & Plan Note (Signed)
S: Reasonably controlled on no statin.  Last visit he had gotten to his personal goal weight of 210 or less-LDL responded to this and was at 91 A/P: continue without meds- has done a good job maintaining weight loss

## 2017-08-09 NOTE — Assessment & Plan Note (Addendum)
S: He reports feeling better overall- more due to acceptance of potential for lifelong issues. Biggest issue is not being able to play the music he loves Depression screen [facility] 2/9 08/09/2017  Decreased Interest 1  Down, Depressed, Hopeless 1  PHQ - 2 Score 2  Altered sleeping 0  Tired, decreased energy 0  Change in appetite 0  Feeling bad or failure about yourself  1  Trouble concentrating 0  Moving slowly or fidgety/restless 0  Suicidal thoughts 0  PHQ-9 Score 3  Difficult doing work/chores -  A/P: Mild depression- he has some control from prior severe depression due to acceptance-though I wonder if his counseling did help.  Will continue to monitor and make sure no suicidal ideation.  He declines trials of SSRI or other therapy.  He declines counseling.  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

## 2017-08-10 ENCOUNTER — Telehealth: Payer: Self-pay

## 2017-08-10 DIAGNOSIS — M25512 Pain in left shoulder: Secondary | ICD-10-CM | POA: Diagnosis not present

## 2017-08-10 NOTE — Telephone Encounter (Signed)
Faxed referral to [facility] attn: Dr. Ivor Messier.

## 2017-09-02 ENCOUNTER — Telehealth: Payer: Self-pay

## 2017-09-02 NOTE — Telephone Encounter (Signed)
Spoke to [facility] scheduling and they had tried to contact patient twice. I called patient, he did finally get in touch with their scheduling department and they are working on a procedure date.

## 2017-09-21 DIAGNOSIS — M25512 Pain in left shoulder: Secondary | ICD-10-CM | POA: Diagnosis not present

## 2017-10-07 ENCOUNTER — Ambulatory Visit: Payer: Medicare Other | Admitting: *Deleted

## 2017-11-24 DIAGNOSIS — I10 Essential (primary) hypertension: Secondary | ICD-10-CM | POA: Diagnosis not present

## 2017-11-24 DIAGNOSIS — D120 Benign neoplasm of cecum: Secondary | ICD-10-CM | POA: Diagnosis not present

## 2017-11-24 DIAGNOSIS — E785 Hyperlipidemia, unspecified: Secondary | ICD-10-CM | POA: Diagnosis not present

## 2017-11-24 DIAGNOSIS — Z7901 Long term (current) use of anticoagulants: Secondary | ICD-10-CM | POA: Diagnosis not present

## 2017-11-24 DIAGNOSIS — D123 Benign neoplasm of transverse colon: Secondary | ICD-10-CM | POA: Diagnosis not present

## 2017-11-24 DIAGNOSIS — D122 Benign neoplasm of ascending colon: Secondary | ICD-10-CM | POA: Diagnosis not present

## 2017-11-24 DIAGNOSIS — D369 Benign neoplasm, unspecified site: Secondary | ICD-10-CM | POA: Diagnosis not present

## 2017-11-24 DIAGNOSIS — I4891 Unspecified atrial fibrillation: Secondary | ICD-10-CM | POA: Diagnosis not present

## 2017-12-03 ENCOUNTER — Telehealth: Payer: Self-pay | Admitting: Gastroenterology

## 2017-12-03 NOTE — Telephone Encounter (Signed)
Documentation note: I received the polyp pathology reports from Dr. Ivor Messier at [facility] for colonoscopy 11/24/17. I was able to open and review the colonoscopy report through [place].  (For documentation:  Report states 3 polyps removed , ICV, AC, TC - pathology lists two polyps - both adenomatous , removed by lift, snare)  Martin Bailey, please place 1 year colonoscopy recall.

## 2017-12-06 NOTE — Telephone Encounter (Signed)
A recall for 08-2018 is already in place. Do you want to change in to September 2020?

## 2017-12-07 NOTE — Telephone Encounter (Signed)
Please change to Sept, 2020

## 2017-12-07 NOTE — Telephone Encounter (Signed)
Done

## 2017-12-30 ENCOUNTER — Other Ambulatory Visit: Payer: Self-pay | Admitting: Family Medicine

## 2018-02-10 ENCOUNTER — Encounter: Payer: Self-pay | Admitting: Family Medicine

## 2018-02-10 ENCOUNTER — Ambulatory Visit (INDEPENDENT_AMBULATORY_CARE_PROVIDER_SITE_OTHER): Payer: Medicare Other | Admitting: Family Medicine

## 2018-02-10 VITALS — BP 120/78 | HR 70 | Temp 97.8°F | Ht 70.25 in | Wt 202.4 lb

## 2018-02-10 DIAGNOSIS — Z23 Encounter for immunization: Secondary | ICD-10-CM

## 2018-02-10 DIAGNOSIS — F1020 Alcohol dependence, uncomplicated: Secondary | ICD-10-CM | POA: Diagnosis not present

## 2018-02-10 DIAGNOSIS — Z87891 Personal history of nicotine dependence: Secondary | ICD-10-CM

## 2018-02-10 DIAGNOSIS — L219 Seborrheic dermatitis, unspecified: Secondary | ICD-10-CM

## 2018-02-10 DIAGNOSIS — R7989 Other specified abnormal findings of blood chemistry: Secondary | ICD-10-CM | POA: Diagnosis not present

## 2018-02-10 DIAGNOSIS — I4811 Longstanding persistent atrial fibrillation: Secondary | ICD-10-CM | POA: Diagnosis not present

## 2018-02-10 DIAGNOSIS — E785 Hyperlipidemia, unspecified: Secondary | ICD-10-CM | POA: Diagnosis not present

## 2018-02-10 DIAGNOSIS — Z0000 Encounter for general adult medical examination without abnormal findings: Secondary | ICD-10-CM | POA: Diagnosis not present

## 2018-02-10 DIAGNOSIS — F3341 Major depressive disorder, recurrent, in partial remission: Secondary | ICD-10-CM

## 2018-02-10 DIAGNOSIS — R410 Disorientation, unspecified: Secondary | ICD-10-CM

## 2018-02-10 LAB — URINALYSIS
Bilirubin Urine: NEGATIVE
HGB URINE DIPSTICK: NEGATIVE
Ketones, ur: NEGATIVE
Leukocytes, UA: NEGATIVE
Nitrite: NEGATIVE
TOTAL PROTEIN, URINE-UPE24: NEGATIVE
URINE GLUCOSE: NEGATIVE
Urobilinogen, UA: 0.2 (ref 0.0–1.0)
pH: 7 (ref 5.0–8.0)

## 2018-02-10 LAB — LIPID PANEL
CHOL/HDL RATIO: 3
Cholesterol: 159 mg/dL (ref 0–200)
HDL: 48.5 mg/dL (ref >39.00)
LDL CALC: 100 mg/dL — AB (ref 0–99)
NONHDL: 110.75
Triglycerides: 56 mg/dL (ref 0.0–149.0)
VLDL: 11.2 mg/dL (ref 0.0–40.0)

## 2018-02-10 LAB — COMPREHENSIVE METABOLIC PANEL
ALBUMIN: 4.3 g/dL (ref 3.5–5.2)
ALT: 12 U/L (ref 0–53)
AST: 15 U/L (ref 0–37)
Alkaline Phosphatase: 60 U/L (ref 39–117)
BUN: 14 mg/dL (ref 6–23)
CALCIUM: 9.3 mg/dL (ref 8.4–10.5)
CHLORIDE: 101 meq/L (ref 96–112)
CO2: 28 mEq/L (ref 19–32)
CREATININE: 0.87 mg/dL (ref 0.40–1.50)
GFR: 90.73 mL/min (ref >60.00)
Glucose, Bld: 107 mg/dL — ABNORMAL HIGH (ref 70–99)
Potassium: 5.3 mEq/L — ABNORMAL HIGH (ref 3.5–5.1)
Sodium: 135 mEq/L (ref 135–145)
Total Bilirubin: 0.5 mg/dL (ref 0.2–1.2)
Total Protein: 7.3 g/dL (ref 6.0–8.3)

## 2018-02-10 LAB — CBC WITH DIFFERENTIAL/PLATELET
BASOS PCT: 1.1 % (ref 0.0–3.0)
Basophils Absolute: 0.1 10*3/uL (ref 0.0–0.1)
EOS PCT: 2.4 % (ref 0.0–5.0)
Eosinophils Absolute: 0.2 10*3/uL (ref 0.0–0.7)
HCT: 42.9 % (ref 39.0–52.0)
HEMOGLOBIN: 14.5 g/dL (ref 13.0–17.0)
Lymphocytes Relative: 21.3 % (ref 12.0–46.0)
Lymphs Abs: 1.9 10*3/uL (ref 0.7–4.0)
MCHC: 33.9 g/dL (ref 30.0–36.0)
MCV: 95 fl (ref 78.0–100.0)
Monocytes Absolute: 0.8 10*3/uL (ref 0.1–1.0)
Monocytes Relative: 9.4 % (ref 3.0–12.0)
NEUTROS ABS: 5.9 10*3/uL (ref 1.4–7.7)
Neutrophils Relative %: 65.8 % (ref 43.0–77.0)
Platelets: 249 10*3/uL (ref 150.0–400.0)
RBC: 4.51 Mil/uL (ref 4.22–5.81)
RDW: 12.9 % (ref 11.5–15.5)
WBC: 8.9 10*3/uL (ref 4.0–10.5)

## 2018-02-10 LAB — TSH: TSH: 3.63 u[IU]/mL (ref 0.35–4.50)

## 2018-02-10 MED ORDER — NAPROXEN 500 MG PO TBEC: 500.0000 mg | DELAYED_RELEASE_TABLET | Freq: Two times a day (BID) | ORAL | 0 refills | Status: DC

## 2018-02-10 MED ORDER — CLOBETASOL PROPIONATE 0.05 % EX SOLN: 1.0000 "application " | Freq: Two times a day (BID) | CUTANEOUS | 0 refills | Status: DC

## 2018-02-10 NOTE — Assessment & Plan Note (Signed)
S: Patient last visit reported some improvement in symptoms due to acceptance of the potential for some lifelong issues like not being able to play music he loves anymore.  He has done counseling for extended periods and did not find this helpful reportedly.  He declines trial of medication.  He does think making more friends would be helpful  Updated PHQ 9 today at 7compared to 3 last time. He admits to it being somewhat worse  He has had a mild elevation in TSH.  Considering depression I would consider treating this if elevating any further.  In regards to counseling- he feels let down by sharing large parts of his identity with therapists and they dont seem to follow up on what he reveals- such as issues with his father who was not a kind man at all to patient- he never felt loved- and then he has a long journal over 49 pages he gave to them that he doesn't feel they followe dup on well.   Lab Results  Component Value Date   TSH 4.90 (H) 02/09/2017  A/P: Patient in tough position here- counseling he thinks could be beneficial but there are just particular issues that therapists focus on and he would like more In depth exploration of others like issues with his father and then going deeper into his journal ed thoughts. I recommend e consider EMDR with Dr. Inocente Salles. He is also considering seeing a psychiatrist that Dr. Glennon Hamilton gave him the name of. I also offered to go deeper into his concerns if he would like at future visits- would want to schedule as 30 minutes to give [place] some time to get deeper.He declines meds at present

## 2018-02-10 NOTE — Assessment & Plan Note (Addendum)
Patient has persistent atrial fibrillation area this is rate controlled on atenolol.  This is anticoagulated with Xarelto. He is stable with current regimen-continue current medication.   He asks for very sparing naproxen for aches/pains- warned of bleeding risk but considering doesn't even use 2 a month typically- I refilled this and he is aware of trade offs/risks

## 2018-02-10 NOTE — Assessment & Plan Note (Addendum)
In June Plays golf with a good friend about twice a year. Had 2 scotches before supper- he had a period of amnesia where he didn't remember eating, drinking, get to restaurant. Apparently he passed out when he went to pick up napkin. Apparently paramedics came but he was not taken to the hospital. - friend talked them out of it. Woke up 3 Am and could barely remember anything. Played golf next day and felt fine. No recurrence since that time.   -doesn't sound like he was drunk as he can typically drink 4-6 drinks without issues.  - offered EKG, echocardiogram, carotid duplex- he declines unless recurrence. Would consider further cardiac testing if intimal testing concerning.  - we discussed transient global amnesia but that wouldn't explain syncope episode - once again he declines further workup unless recurrent

## 2018-02-10 NOTE — Assessment & Plan Note (Signed)
Alcoholism-continues to drink at least 4 scotches a day-at least this is down from 6 scotches a day.  He declined AA and in the past he is asked me not to ask about this

## 2018-02-10 NOTE — Assessment & Plan Note (Signed)
Seborrheic dermatitis-on Selsun Blue.  Ketoconazole shampoo has not been helpful.  He has a cream through dermatology he uses with flares. I will refill his clobetasol prionate topical which he uses once a month or so

## 2018-02-10 NOTE — Progress Notes (Signed)
Phone: [phone]  Subjective:  Patient presents today for their annual physical. Chief complaint-noted.   See problem oriented charting- ROS- full  review of systems was completed and negative except for: dental problems  The following were reviewed and entered/updated in epic: Past Medical History:  Diagnosis Date  . COLONIC POLYPS, HX OF 04/05/2007   ONE ADENOMATOUS POLYP AND ONE HYPERPLASTIC POLYP  . DEPRESSION 03/04/2007  . DERMATITIS, SEBORRHEIC 07/19/2007  . FIBRILLATION, ATRIAL 03/04/2007  . HYPERLIPIDEMIA 03/04/2007  . HYPERTENSION 03/04/2007  . Kidney stones    Patient Active Problem List   Diagnosis Date Noted  . Alcoholism ([place]) 06/21/2014    Priority: High  . Atrial fibrillation ([place]) 03/04/2007    Priority: High  . Elevated TSH 09/21/2014    Priority: Medium  . Seborrheic dermatitis 07/19/2007    Priority: Medium  . Hyperlipidemia 03/04/2007    Priority: Medium  . Major depression ([place]) 03/04/2007    Priority: Medium  . Essential hypertension 03/04/2007    Priority: Medium  . Former smoker 06/21/2014    Priority: Low  . Allergic rhinitis 04/06/2014    Priority: Low  . GERD (gastroesophageal reflux disease) 04/06/2014    Priority: Low  . Hx of adenomatous colonic polyps 03/05/2014    Priority: Low  . Chronic anticoagulation 03/05/2014    Priority: Low  . Testosterone deficiency 09/02/2010    Priority: Low  . Disorientation 02/10/2018  . Overweight 08/09/2017   Past Surgical History:  Procedure Laterality Date  . COLONOSCOPY    . RADIOFREQUENCY ABLATION     x2 for fib, on 2nd attempt switched form a flutter ot a fib.   Marland Kitchen ROTATOR CUFF REPAIR Right   . TONSILLECTOMY      Family History  Problem Relation Age of Onset  . Osteoporosis Mother   . Dementia Mother   . Lung cancer Father        former smoker  . Colon cancer Neg Hx   . Stomach cancer Neg Hx   . Esophageal cancer Neg Hx   . Rectal cancer Neg Hx     Medications- reviewed and  updated Current Outpatient Medications  Medication Sig Dispense Refill  . amLODipine (NORVASC) 5 MG tablet TAKE 1 TABLET BY MOUTH  DAILY 90 tablet 3  . atenolol (TENORMIN) 50 MG tablet TAKE 1 TABLET BY MOUTH TWO  TIMES DAILY 180 tablet 1  . clobetasol (TEMOVATE) 0.05 % external solution Apply 1 application topically 2 (two) times daily. For seborrheic dermatitis- 7 days maximum 50 mL 0  . naproxen (EC NAPROSYN) 500 MG EC tablet Take 1 tablet (500 mg total) by mouth 2 (two) times daily with a meal. Very sparing use for shoulder (knows risk with xarelto) 30 tablet 0  . rivaroxaban (XARELTO) 20 MG TABS tablet Take 1 tablet (20 mg total) by mouth daily with supper. 90 tablet 3  . selenium sulfide (SELSUN) 2.5 % shampoo Apply 1 application topically daily as needed for irritation. 118 mL 5  . triamcinolone cream (KENALOG) 0.5 % Apply topically 2 (two) times daily as needed. 30 g 1   No current facility-administered medications for this visit.     Allergies-reviewed and updated No Known Allergies  Social History   Social History Narrative   Widower-wife died 49. 1 daughter. 2 grandkids. 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  Hobbies: golf, bridge, computer, sports, woodworking    Objective: BP 120/78 (BP Location: Left Arm, Patient Position: Sitting, Cuff Size: Normal)   Pulse 70   Temp 97.8 F (36.6 C) (Oral)   Ht 5' 10.25" (1.784 m)   Wt 202 lb 6.1 oz (91.8 kg)   SpO2 98%   BMI 28.83 kg/m  Gen: NAD, resting comfortably HEENT: Mucous membranes are moist. Oropharynx normal Neck: no thyromegaly CV: RRR no murmurs rubs or gallops Lungs: CTAB no crackles, wheeze, rhonchi Abdomen: soft/nontender/nondistended/normal bowel sounds.   Ext: no edema Skin: warm, dry Neuro: grossly normal, moves all extremities, PERRLA  Assessment/Plan:  77 y.o. male  presenting for annual physical.  Health Maintenance counseling: 1. Anticipatory guidance: Patient counseled regarding regular dental exams -q6 months, eye exams -yearly,  avoiding smoking and second hand smoke, limiting alcohol to 2 beverages per day - he is at 4 scotches a day- sometimes more.   2. Risk factor reduction:  Advised patient of need for regular exercise and diet rich and fruits and vegetables to reduce risk of heart attack and stroke. Exercise-last year was exercising regularly at the gym- 3x a week still. Diet-last year his goal was 195 on home scales and he got 1 pound within this-this year states at 196 at home- he is trying to stay stable .  Wt Readings from Last 3 Encounters:  02/10/18 202 lb 6.1 oz (91.8 kg)  08/09/17 203 lb 12.8 oz (92.4 kg)  07/27/17 206 lb (93.4 kg)  3. Immunizations/screenings/ancillary studies-flu shot today.  Discussed Shingrix at pharmacy- needs 1 more shot Immunization History  Administered Date(s) Administered  . Influenza Split 12/22/2011  . Influenza Whole 01/18/2008, 01/04/2009  . Influenza, High Dose Seasonal PF 01/02/2016, 01/21/2017, 02/10/2018  . Influenza,inj,Quad PF,6+ Mos 12/23/2012, 02/20/2014, 12/21/2014  . Pneumococcal Conjugate-13 06/21/2014  . Pneumococcal Polysaccharide-23 01/18/2008  . Td 11/21/2001  . Tdap 12/22/2011  . Zoster 01/18/2008  . Zoster Recombinat (Shingrix) 02/03/2018  4. Prostate cancer screening- past age based screening.  No change in urinary symptoms.  No longer testing. Lab Results  Component Value Date   PSA 0.44 11/25/2011   PSA 0.38 04/11/2009   PSA 0.38 07/19/2008   5. Colon cancer screening - colonoscopy 07/27/2017 with 1 year repeat due adenomatous polyps- this actually has been done at [facility] 6. Skin cancer screening-saw dermatology last year- no more recent visits- he asks me to fill seb derm medication as below. advised regular sunscreen use. Denies worrisome, changing, or new skin lesions.  7.  Former  smoker-declines AAA screening in past- agrees to do this.  Will get urine again this year.  Status of chronic or acute concerns   Left great ingrown toenail- intermittent soaks/lifting nail- doing ok recently  Last visit seeing PT at [place] on [facility] and was improving in regards to both shoulders.  Had shoulder injections previously- he feels better. A few times a year takes naproxen- 5 years ago was given 60 naproxen 500mg  and has several pills left. Requests refill just to have on hand   Occasional nosebleeds- gets better with pressure- will monitor- xarelto contributes  Hypertension-controlled on amlodipine 5 mg and atenolol 50 mg.  Stable-continue current medications  Hyperlipidemia- reasonably controlled in past with LDL under 100 with weight loss. Today- lab show stability with LDL right ta 100- continue to push for further lifestyle changes  Major depression ([place]) S: Patient last visit reported some improvement in symptoms due to acceptance of the potential for  some lifelong issues like not being able to play music he loves anymore.  He has done counseling for extended periods and did not find this helpful reportedly.  He declines trial of medication.  He does think making more friends would be helpful  Updated PHQ 9 today at 7compared to 3 last time. He admits to it being somewhat worse  He has had a mild elevation in TSH.  Considering depression I would consider treating this if elevating any further.  In regards to counseling- he feels let down by sharing large parts of his identity with therapists and they dont seem to follow up on what he reveals- such as issues with his father who was not a kind man at all to patient- he never felt loved- and then he has a long journal over 67 pages he gave to them that he doesn't feel they followe dup on well.   Lab Results  Component Value Date   TSH 4.90 (H) 02/09/2017  A/P: Patient in tough position here- counseling he  thinks could be beneficial but there are just particular issues that therapists focus on and he would like more In depth exploration of others like issues with his father and then going deeper into his journal ed thoughts. I recommend e consider EMDR with Dr. Inocente Salles. He is also considering seeing a psychiatrist that Dr. Glennon Hamilton gave him the name of. I also offered to go deeper into his concerns if he would like at future visits- would want to schedule as 30 minutes to give [place] some time to get deeper.He declines meds at present   Seborrheic dermatitis Seborrheic dermatitis-on Selsun Blue.  Ketoconazole shampoo has not been helpful.  He has a cream through dermatology he uses with flares. I will refill his clobetasol prionate topical which he uses once a month or so  Alcoholism ([place]) Alcoholism-continues to drink at least 4 scotches a day-at least this is down from 6 scotches a day.  He declined AA and in the past he is asked me not to ask about this   Atrial fibrillation [facility]) Patient has persistent atrial fibrillation area this is rate controlled on atenolol.  This is anticoagulated with Xarelto. He is stable with current regimen-continue current medication.   He asks for very sparing naproxen for aches/pains- warned of bleeding risk but considering doesn't even use 2 a month typically- I refilled this and he is aware of trade offs/risks  Disorientation In June Plays golf with a good friend about twice a year. Had 2 scotches before supper- he had a period of amnesia where he didn't remember eating, drinking, get to restaurant. Apparently he passed out when he went to pick up napkin. Apparently paramedics came but he was not taken to the hospital. - friend talked them out of it. Woke up 3 Am and could barely remember anything. Played golf next day and felt fine. No recurrence since that time.   -doesn't sound like he was drunk as he can typically drink 4-6 drinks without issues.  - offered EKG,  echocardiogram, carotid duplex- he declines unless recurrence. Would consider further cardiac testing if intimal testing concerning.  - we discussed transient global amnesia but that wouldn't explain syncope episode - once again he declines further workup unless recurrent    Future Appointments  Date Time Provider [place]  08/08/2018  9:20 AM [name], MD [facility]   Lab/Order associations: Preventative health care  Hyperlipidemia, unspecified hyperlipidemia type - Plan: CBC with  Differential/Platelet, Comprehensive metabolic panel, Lipid panel, Urinalysis, Urinalysis  Longstanding persistent atrial fibrillation - Plan: CBC with Differential/Platelet, Comprehensive metabolic panel  Alcoholism ([place]) - Plan: Comprehensive metabolic panel  Recurrent major depressive disorder, in partial remission (HCC)  Elevated TSH - Plan: TSH  Need for prophylactic vaccination and inoculation against influenza - Plan: Flu vaccine HIGH DOSE PF  Former smoker - Plan: VAS US [facility] MEDICARE SCREEN  Seborrheic dermatitis  Disorientation  Meds ordered this encounter  Medications  . naproxen (EC NAPROSYN) 500 MG EC tablet    Sig: Take 1 tablet (500 mg total) by mouth 2 (two) times daily with a meal. Very sparing use for shoulder (knows risk with xarelto)    Dispense:  30 tablet    Refill:  0  . clobetasol (TEMOVATE) 0.05 % external solution    Sig: Apply 1 application topically 2 (two) times daily. For seborrheic dermatitis- 7 days maximum    Dispense:  50 mL    Refill:  0   Return precautions advised.  Garret Reddish, MD

## 2018-02-10 NOTE — Patient Instructions (Addendum)
Health Maintenance Due  Topic Date Due  . INFLUENZA VACCINE -thanks for doing your flu shot today 10/28/2017   We will call you within two weeks about your referral for AAA scan. If you do not hear within 3 weeks, give [place] a call.   I think seeing psychiatry or getting set up for some 30 minute visits so we can do some more counseling here in regards to your past is very reasonable  Please stop by lab before you go  Thanks for putting up with me! Great to see you today. I want to help you feel better any way I can   3 month follow up or sooner if needed. Can stretch to 6 if you prefer

## 2018-02-28 ENCOUNTER — Ambulatory Visit (HOSPITAL_COMMUNITY)
Admission: RE | Admit: 2018-02-28 | Discharge: 2018-02-28 | Disposition: A | Discharge status: Home / Self Care | Payer: Medicare Other | Source: Ambulatory Visit | Attending: Cardiology | Admitting: Cardiology

## 2018-02-28 DIAGNOSIS — Z87891 Personal history of nicotine dependence: Secondary | ICD-10-CM | POA: Insufficient documentation

## 2018-05-14 ENCOUNTER — Other Ambulatory Visit: Payer: Self-pay | Admitting: Family Medicine

## 2018-06-20 ENCOUNTER — Encounter: Payer: Self-pay | Admitting: Family Medicine

## 2018-06-20 ENCOUNTER — Ambulatory Visit (INDEPENDENT_AMBULATORY_CARE_PROVIDER_SITE_OTHER): Payer: Medicare Other | Admitting: Family Medicine

## 2018-06-20 VITALS — BP 111/67 | HR 92

## 2018-06-20 DIAGNOSIS — F3341 Major depressive disorder, recurrent, in partial remission: Secondary | ICD-10-CM

## 2018-06-20 DIAGNOSIS — J301 Allergic rhinitis due to pollen: Secondary | ICD-10-CM | POA: Diagnosis not present

## 2018-06-20 DIAGNOSIS — I4811 Longstanding persistent atrial fibrillation: Secondary | ICD-10-CM

## 2018-06-20 DIAGNOSIS — F1020 Alcohol dependence, uncomplicated: Secondary | ICD-10-CM | POA: Diagnosis not present

## 2018-06-20 MED ORDER — FLUTICASONE PROPIONATE 50 MCG/ACT NA SUSP: 2.0000 | Freq: Every day | NASAL | 3 refills | Status: DC

## 2018-06-20 NOTE — Progress Notes (Signed)
Phone [phone]   Subjective:  Virtual visit via Video note  I connected with Martin Bailey on 06/20/18 at  1:40 PM EDT by  phonecall (televisit today) and verified that I am speaking with the correct person using two identifiers.  Location patient: Home Location provider: [facility], office Persons participating in the virtual visit: patient  I discussed the limitations of evaluation and management by telemedicine and the availability of in person appointments. The patient expressed understanding and agreed to proceed.   ROS- No chest pain or shortness of breath. No headache or blurry vision. No fever. Does have some cough when gets up in the morning- that has seemed to get worsen recently.    Past Medical History-  Patient Active Problem List   Diagnosis Date Noted  . Alcoholism ([place]) 06/21/2014    Priority: High  . Atrial fibrillation ([place]) 03/04/2007    Priority: High  . Elevated TSH 09/21/2014    Priority: Medium  . Seborrheic dermatitis 07/19/2007    Priority: Medium  . Hyperlipidemia 03/04/2007    Priority: Medium  . Major depression ([place]) 03/04/2007    Priority: Medium  . Essential hypertension 03/04/2007    Priority: Medium  . Former smoker 06/21/2014    Priority: Low  . Allergic rhinitis 04/06/2014    Priority: Low  . GERD (gastroesophageal reflux disease) 04/06/2014    Priority: Low  . Hx of adenomatous colonic polyps 03/05/2014    Priority: Low  . Chronic anticoagulation 03/05/2014    Priority: Low  . Testosterone deficiency 09/02/2010    Priority: Low  . Disorientation 02/10/2018  . Overweight 08/09/2017    Medications- reviewed and updated Current Outpatient Medications  Medication Sig Dispense Refill  . amLODipine (NORVASC) 5 MG tablet TAKE 1 TABLET BY MOUTH  DAILY 90 tablet 3  . atenolol (TENORMIN) 50 MG tablet TAKE 1 TABLET BY MOUTH TWO  TIMES DAILY 180 tablet 1  . clobetasol (TEMOVATE) 0.05 % external solution Apply 1 application topically 2  (two) times daily. For seborrheic dermatitis- 7 days maximum 50 mL 0  . naproxen (EC NAPROSYN) 500 MG EC tablet Take 1 tablet (500 mg total) by mouth 2 (two) times daily with a meal. Very sparing use for shoulder (knows risk with xarelto) 30 tablet 0  . selenium sulfide (SELSUN) 2.5 % shampoo Apply 1 application topically daily as needed for irritation. 118 mL 5  . triamcinolone cream (KENALOG) 0.5 % Apply topically 2 (two) times daily as needed. 30 g 1  . XARELTO 20 MG TABS tablet TAKE 1 TABLET BY MOUTH  DAILY WITH SUPPER 90 tablet 3  . fluticasone (FLONASE) 50 MCG/ACT nasal spray Place 2 sprays into both nostrils daily. 16 g 3   No current facility-administered medications for this visit.      Objective:  BP 111/67   Pulse 92  Gen: NAD obvious by phone Normal speech    Assessment and Plan   Allergic rhinitis # Cough/potential allergies S: Does have some cough when gets up in the morning- that has seemed to get worsen recently. Has also noted sometimes if he lays down in the recliner for a while will get similar issues. Now also happening at nighttime when he lays down. More than 6 months of symptoms. Can get lightheaded from prolonged coughing fits. Has tried claritin and another antihistamine without relief.   Better this weekend.  A/P: possible poor control of allergies- will send in flonase and encouraged antihistamine use.   He feels  strongly this is postnasal drip- we discussed possible antibiotic if no improvement with the above.  - prior smoking history- 15 pack years- quit in 1979. Discussed if no improvement in symptoms with either of above- likely get chest x-ray  #Alcoholism S: Patient continues to drink at a high level-4 scotches a day and sometimes more. A/P: patient not willing to cut down.   # Depression S: PHQ 9 somewhat elevated today with score of 9-was 7 last visit and 3 visit before that.  Last visit he stated he had some acceptance that he will not be able  to play music anymore.  He has done extended counseling in the past and did not find this helpful in the past.  Had prior TSH with mild elevation but the last one was normal.  Had mild TSH elevation in the past-most recent one was not elevated.  Patient unfortunately was not treated well as a child-father was not a very kind man's patient.  Patient never felt loved. Depression screen [facility] 2/9 06/20/2018  Decreased Interest 2  Down, Depressed, Hopeless 2  PHQ - 2 Score 4  Altered sleeping 0  Tired, decreased energy 3  Change in appetite 0  Feeling bad or failure about yourself  1  Trouble concentrating 0  Moving slowly or fidgety/restless 0  Suicidal thoughts 1  PHQ-9 Score 9  Difficult doing work/chores Not difficult at all  A/P:  Stable- encouraged psychologist follow up (person that patient has seen in past but patient wants to work from a different perspective as far as why he thinks the way he thinks and acts the way he acts)  # atrial fibrillation S:persitent atrial fibrillation. Has been rate controlled with atenolol and anticoagulated with xarelto.   A/P:   Stable. Continue current medications.  - uses 2 a month naproxen. He is aware of increased bleeding risks and if every had bleeding issues we would permanently discontinue  #hypertension S: controlled in past. Most recent home checks in 120s over 70s. Compliant with amlodipine 5 mg and atenolol 50mg  twice a day BP Readings from Last 3 Encounters:  06/20/18 111/67 home cuff check  02/10/18 120/78  08/09/17 110/78  A/P: Stable. Continue current medications.   Time based telemedicine coding. Phone call started 11:05 AM And ended 11:21 AM.   Future Appointments  Date Time Provider [place]  06/20/2018  1:40 PM [name], MD [facility]  08/08/2018  9:20 AM Yong Channel [name], MD [facility]   Meds ordered this encounter  Medications  . fluticasone (FLONASE) 50 MCG/ACT nasal spray    Sig: Place 2 sprays  into both nostrils daily.    Dispense:  16 g    Refill:  3   Return precautions advised.  Garret Reddish, MD

## 2018-06-20 NOTE — Patient Instructions (Addendum)
Video visit

## 2018-06-20 NOTE — Assessment & Plan Note (Signed)
#   Cough/potential allergies S: Does have some cough when gets up in the morning- that has seemed to get worsen recently. Has also noted sometimes if he lays down in the recliner for a while will get similar issues. Now also happening at nighttime when he lays down. More than 6 months of symptoms. Can get lightheaded from prolonged coughing fits. Has tried claritin and another antihistamine without relief.   Better this weekend.  A/P: possible poor control of allergies- will send in flonase and encouraged antihistamine use.   He feels strongly this is postnasal drip- we discussed possible antibiotic if no improvement with the above.  - prior smoking history- 15 pack years- quit in 1979. Discussed if no improvement in symptoms with either of above- likely get chest x-ray

## 2018-07-15 ENCOUNTER — Other Ambulatory Visit: Payer: Self-pay | Admitting: Family Medicine

## 2018-08-08 ENCOUNTER — Encounter: Payer: Self-pay | Admitting: Family Medicine

## 2018-08-08 ENCOUNTER — Telehealth (INDEPENDENT_AMBULATORY_CARE_PROVIDER_SITE_OTHER): Payer: Medicare Other | Admitting: Family Medicine

## 2018-08-08 VITALS — BP 125/78 | Temp 97.6°F | Ht 70.25 in | Wt 195.0 lb

## 2018-08-08 DIAGNOSIS — F3342 Major depressive disorder, recurrent, in full remission: Secondary | ICD-10-CM

## 2018-08-08 DIAGNOSIS — J301 Allergic rhinitis due to pollen: Secondary | ICD-10-CM

## 2018-08-08 DIAGNOSIS — I10 Essential (primary) hypertension: Secondary | ICD-10-CM | POA: Diagnosis not present

## 2018-08-08 DIAGNOSIS — R918 Other nonspecific abnormal finding of lung field: Secondary | ICD-10-CM

## 2018-08-08 DIAGNOSIS — F1020 Alcohol dependence, uncomplicated: Secondary | ICD-10-CM

## 2018-08-08 MED ORDER — AMOXICILLIN-POT CLAVULANATE 875-125 MG PO TABS: 1.0000 | ORAL_TABLET | Freq: Two times a day (BID) | ORAL | 0 refills | Status: AC

## 2018-08-08 NOTE — Patient Instructions (Addendum)
Health Maintenance Due  Topic Date Due  . COLONOSCOPY- see notes-patient has not been contacted about next exam after August 2019 colonoscopy with [facility] 07/28/2018   Depression screen [facility] 2/9 06/20/2018 02/10/2018 08/09/2017  Decreased Interest 2 2 1   Down, Depressed, Hopeless 2 3 1   PHQ - 2 Score 4 5 2   Altered sleeping 0 0 0  Tired, decreased energy 3 1 0  Change in appetite 0 0 0  Feeling bad or failure about yourself  1 1 1   Trouble concentrating 0 0 0  Moving slowly or fidgety/restless 0 0 0  Suicidal thoughts 1 0 0  PHQ-9 Score 9 7 3   Difficult doing work/chores Not difficult at all Not difficult at all -   Video visit

## 2018-08-08 NOTE — Progress Notes (Signed)
Phone [phone]   Subjective:  Virtual visit via Video note. Chief complaint: Chief Complaint  Patient presents with  . Hypertension    This visit type was conducted due to national recommendations for restrictions regarding the COVID-19 Pandemic (e.g. social distancing).  This format is felt to be most appropriate for this patient at this time balancing risks to patient and risks to population by having him in for in person visit.  No physical exam was performed (except for noted visual exam or audio findings with Telehealth visits).    Our team/I connected with Martin Bailey on 08/08/18 at  9:20 AM EDT by a video enabled telemedicine application ([email or web address]) and verified that I am speaking with the correct person using two identifiers.  Location patient: Home[place] Location provider: [facility], office Persons participating in the virtual visit:  patient  Our team/I discussed the limitations of evaluation and management by telemedicine and the availability of in person appointments. In light of current covid-19 pandemic, patient also understands that we are trying to protect them by minimizing in office contact if at all possible.  The patient expressed consent for telemedicine visit and agreed to proceed. Patient understands insurance will be billed.   ROS- No chest pain or shortness of breath. No headache or blurry vision. No fever/chills.    Past Medical History-  Patient Active Problem List   Diagnosis Date Noted  . Alcoholism ([place]) 06/21/2014    Priority: High  . Atrial fibrillation ([place]) 03/04/2007    Priority: High  . Elevated TSH 09/21/2014    Priority: Medium  . Seborrheic dermatitis 07/19/2007    Priority: Medium  . Hyperlipidemia 03/04/2007    Priority: Medium  . Major depression ([place]) 03/04/2007    Priority: Medium  . Essential hypertension 03/04/2007    Priority: Medium  . Former smoker 06/21/2014    Priority: Low  . Allergic rhinitis 04/06/2014    Priority: Low   . GERD (gastroesophageal reflux disease) 04/06/2014    Priority: Low  . Hx of adenomatous colonic polyps 03/05/2014    Priority: Low  . Chronic anticoagulation 03/05/2014    Priority: Low  . Testosterone deficiency 09/02/2010    Priority: Low  . Disorientation 02/10/2018  . Overweight 08/09/2017    Medications- reviewed and updated Current Outpatient Medications  Medication Sig Dispense Refill  . amLODipine (NORVASC) 5 MG tablet TAKE 1 TABLET BY MOUTH  DAILY 90 tablet 3  . atenolol (TENORMIN) 50 MG tablet TAKE 1 TABLET BY MOUTH TWO  TIMES DAILY 180 tablet 1  . clobetasol (TEMOVATE) 0.05 % external solution Apply 1 application topically 2 (two) times daily. For seborrheic dermatitis- 7 days maximum 50 mL 0  . fluticasone (FLONASE) 50 MCG/ACT nasal spray Place 2 sprays into both nostrils daily. 16 g 3  . selenium sulfide (SELSUN) 2.5 % shampoo Apply 1 application topically daily as needed for irritation. 118 mL 5  . triamcinolone cream (KENALOG) 0.5 % Apply topically 2 (two) times daily as needed. 30 g 1  . XARELTO 20 MG TABS tablet TAKE 1 TABLET BY MOUTH  DAILY WITH SUPPER 90 tablet 3  . amoxicillin-clavulanate (AUGMENTIN) 875-125 MG tablet Take 1 tablet by mouth 2 (two) times daily for 7 days. 14 tablet 0  . naproxen (EC NAPROSYN) 500 MG EC tablet Take 1 tablet (500 mg total) by mouth 2 (two) times daily with a meal. Very sparing use for shoulder (knows risk with xarelto) (Patient not taking: Reported on 08/08/2018) 30 tablet 0  No current facility-administered medications for this visit.      Objective:  BP 125/78   Temp 97.6 F (36.4 C)   Ht 5' 10.25" (1.784 m)   Wt 195 lb (88.5 kg)   BMI 27.78 kg/m  self reported vitals Gen: NAD, resting comfortably Lungs: nonlabored, normal respiratory rate  Skin: appears dry, no obvious rash     Assessment and Plan   # allergic rhinitis S:patient had poor control abotu 2 months ago- we placed him on flonase at that time and  encouraged regular antihistamine use. He did a long trial of zyrtec or allegra and he doesn't feel liek its been making a big difference- he is still taking them  We had discussed antibiotic if not improving. With cough each morning- we discussed possible cxr given smoking history but did quit in 1979 so remote history.    Long coughing spell this morning- slightly improved for most part on flonase  Occasionally late at night will get coughing fit.  A/P: With lingering discharge in the morning-we opted to trial Augmentin in case there is a bacterial element/draining sinus discharge - with ongoing cough along with this and smoking history- discussed getting a chest x-ray if symptoms do not resolve in 2 weeks-patient in agreement  # Depression/alcoholism S: phq9 elevated at 9 last visit- he was considering seeing a former psychologist (had only been to him twice in past)  He admits he had been really down  Woodworking as a hobby- daughter asked him to make 2 side tables and he has found that super helpful. He has states that has been very therapeutic  4 drinks per day Depression screen [facility] 2/9 06/20/2018  Decreased Interest 2  Down, Depressed, Hopeless 2  PHQ - 2 Score 4  Altered sleeping 0  Tired, decreased energy 3  Change in appetite 0  Feeling bad or failure about yourself  1  Trouble concentrating 0  Moving slowly or fidgety/restless 0  Suicidal thoughts 1  PHQ-9 Score 9  Difficult doing work/chores Not difficult at all  A/P: Thrilled woodworking has been so beneficial to him-hoping benefit persist- continue without medication and update PHQ 9 at follow-up -Recommended cutting down from 4 alcoholic beverages per day the patient declined once again-he does not believe he has a problem  #hypertension S: controlled on amlodipine 5 mg, atenolol 50mg  BP Readings from Last 3 Encounters:  08/08/18 125/78  06/20/18 111/67  02/10/18 120/78  A/P:  Stable. Continue current medications.     Other notes: 1. Colonoscopy 11/24/17 with [facility]- has not heard abotu follow up yet.  We will check in about this next visit  02/11/2019 or later for physical advised- patient to call back to schedule.  Consider TSH, lipids, CBC, CMP at that time Lab/Order associations: Recurrent major depressive disorder, in full remission [facility])  Essential hypertension  Seasonal allergic rhinitis due to pollen  Alcoholism [facility])  Meds ordered this encounter  Medications  . amoxicillin-clavulanate (AUGMENTIN) 875-125 MG tablet    Sig: Take 1 tablet by mouth 2 (two) times daily for 7 days.    Dispense:  14 tablet    Refill:  0    Return precautions advised.  Garret Reddish, MD

## 2018-08-19 ENCOUNTER — Encounter: Payer: Self-pay | Admitting: Family Medicine

## 2018-08-23 ENCOUNTER — Telehealth: Payer: Self-pay | Admitting: Family Medicine

## 2018-08-23 ENCOUNTER — Ambulatory Visit: Payer: Self-pay

## 2018-08-23 DIAGNOSIS — Z20822 Contact with and (suspected) exposure to covid-19: Secondary | ICD-10-CM

## 2018-08-23 NOTE — Telephone Encounter (Signed)
testing order request sent to [facility]. Tried to inform pt of update but no answer. Lm on vm to return call.

## 2018-08-23 NOTE — Telephone Encounter (Signed)
Pt called to schedule appointment for continuous cough and drainage from the back of his throat. Pt states that this cough and drainage issue has been ongoing for over a year. He has no fever no other symptoms. He states that he has discussed this with Dr Yong Channel recently and there where suggestions of CXR and referal to ENT. Pt is wanting to discuss these options with Dr Yong Channel. Care advice read to patient. Patient verbalized understanding. Note will be routed to office for appointment scheduling in the AM. Patient is aware.  Reason for Disposition . [1] Continuous (nonstop) coughing interferes with work or school AND [2] no improvement using cough treatment per protocol  Answer Assessment - Initial Assessment Questions 1. COVID-19 DIAGNOSIS: "Who made your Coronavirus (COVID-19) diagnosis?" "Was it confirmed by a positive lab test?" If not diagnosed by a HCP, ask "Are there lots of cases (community spread) where you live?" (See public health department website, if unsure)   * MAJOR community spread: high number of cases; numbers of cases are increasing; many people hospitalized.   * MINOR community spread: low number of cases; not increasing; few or no people hospitalized     [place] 2. ONSET: "When did the COVID-19 symptoms start?"      1 year ago 3. WORST SYMPTOM: "What is your worst symptom?" (e.g., cough, fever, shortness of breath, muscle aches)     Sinus drainage that causes cough 4. COUGH: "Do you have a cough?" If so, ask: "How bad is the cough?"       Yes when he lies down 5. FEVER: "Do you have a fever?" If so, ask: "What is your temperature, how was it measured, and when did it start?"    None 6. RESPIRATORY STATUS: "Describe your breathing?" (e.g., shortness of breath, wheezing, unable to speak)    no 7. BETTER-SAME-WORSE: "Are you getting better, staying the same or getting worse compared to yesterday?"  If getting worse, ask, "In what way?"     Same  8. HIGH RISK DISEASE: "Do  you have any chronic medical problems?" (e.g., asthma, heart or lung disease, weak immune system, etc.)     no 9. PREGNANCY: "Is there any chance you are pregnant?" "When was your last menstrual period?"    N/A 10. OTHER SYMPTOMS: "Do you have any other symptoms?"  (e.g., runny nose, headache, sore throat, loss of smell)      none  Protocols used: CORONAVIRUS (COVID-19) DIAGNOSED OR SUSPECTED-A-AH

## 2018-08-23 NOTE — Telephone Encounter (Signed)
Called patient and scheduled an appointment to have a covid-19 test. Scheduled for Wednesday 08/24/2018 at 9:30 in thel morning at the [facility]. Pt made aware to wear a mask, stay in the car and with windows rolled up until time for testing. Pt voiced understanding.

## 2018-08-23 NOTE — Telephone Encounter (Signed)
This has been taking care of. [facility] tried calling pt for Covid-19 testing but no answer.

## 2018-08-23 NOTE — Telephone Encounter (Signed)
Attempted to call patient to schedule an approximant for covid-19, no answer. Will try to call back. Order has been placed.Marland Kitchen

## 2018-08-23 NOTE — Telephone Encounter (Signed)
Please call and schedule appointment.

## 2018-08-24 ENCOUNTER — Other Ambulatory Visit: Payer: Self-pay

## 2018-08-24 DIAGNOSIS — Z20822 Contact with and (suspected) exposure to covid-19: Secondary | ICD-10-CM

## 2018-08-24 DIAGNOSIS — R6889 Other general symptoms and signs: Secondary | ICD-10-CM | POA: Diagnosis not present

## 2018-08-24 NOTE — Telephone Encounter (Signed)
This has been addressed.

## 2018-08-26 ENCOUNTER — Other Ambulatory Visit: Payer: Self-pay

## 2018-08-26 DIAGNOSIS — R053 Chronic cough: Secondary | ICD-10-CM

## 2018-08-26 DIAGNOSIS — R05 Cough: Secondary | ICD-10-CM

## 2018-08-26 LAB — NOVEL CORONAVIRUS, NAA: SARS-CoV-2, NAA: NOT DETECTED

## 2018-08-26 NOTE — Progress Notes (Signed)
Chest  

## 2018-08-29 NOTE — Progress Notes (Signed)
I called pt and he informed me that he did call office today and someone told him that he can just go to [facility] in the morning.  I did not see an appointment scheduled.  I asked him to call [facility] and check to make sure he had an appointment.

## 2018-08-30 ENCOUNTER — Other Ambulatory Visit: Payer: Self-pay | Admitting: Family Medicine

## 2018-08-30 ENCOUNTER — Telehealth: Payer: Self-pay | Admitting: Family Medicine

## 2018-08-30 ENCOUNTER — Ambulatory Visit (INDEPENDENT_AMBULATORY_CARE_PROVIDER_SITE_OTHER): Payer: Medicare Other

## 2018-08-30 DIAGNOSIS — R05 Cough: Secondary | ICD-10-CM

## 2018-08-30 DIAGNOSIS — R053 Chronic cough: Secondary | ICD-10-CM

## 2018-08-30 MED ORDER — DOXYCYCLINE HYCLATE 100 MG PO TABS: 100.0000 mg | ORAL_TABLET | Freq: Two times a day (BID) | ORAL | 0 refills | Status: DC

## 2018-08-30 NOTE — Telephone Encounter (Signed)
Noted  

## 2018-08-30 NOTE — Telephone Encounter (Signed)
Will make note on result note.

## 2018-08-30 NOTE — Telephone Encounter (Signed)
Notified the practice and routing to the office now.

## 2018-08-30 NOTE — Telephone Encounter (Signed)
Erline Levine from [facility] called with report of chest xray which shows:  IMPRESSION: Right lower lobe airspace consolidation felt to represent pneumonia. Lungs elsewhere clear. No evident adenopathy.  Followup PA and lateral chest radiographs recommended in 3-4 weeks following trial of antibiotic therapy to ensure resolution and exclude underlying malignancy.

## 2018-08-30 NOTE — Telephone Encounter (Signed)
See note

## 2018-08-31 ENCOUNTER — Other Ambulatory Visit: Payer: Self-pay

## 2018-08-31 DIAGNOSIS — J189 Pneumonia, unspecified organism: Secondary | ICD-10-CM

## 2018-09-01 ENCOUNTER — Encounter: Payer: Self-pay | Admitting: Family Medicine

## 2018-09-01 DIAGNOSIS — R0981 Nasal congestion: Secondary | ICD-10-CM

## 2018-09-12 DIAGNOSIS — R05 Cough: Secondary | ICD-10-CM | POA: Insufficient documentation

## 2018-09-12 DIAGNOSIS — R059 Cough, unspecified: Secondary | ICD-10-CM | POA: Insufficient documentation

## 2018-09-12 DIAGNOSIS — K219 Gastro-esophageal reflux disease without esophagitis: Secondary | ICD-10-CM | POA: Diagnosis not present

## 2018-10-08 ENCOUNTER — Other Ambulatory Visit: Payer: Self-pay | Admitting: Family Medicine

## 2018-11-13 ENCOUNTER — Encounter: Payer: Self-pay | Admitting: Family Medicine

## 2018-11-15 NOTE — Telephone Encounter (Signed)
Called pt and left VM to call the office.  

## 2018-11-15 NOTE — Telephone Encounter (Signed)
Spoke with patient, scheduled lab visit for tomorrow at 9:15 AM for repeat CXR and virtual visit with [name] the following week.

## 2018-11-15 NOTE — Telephone Encounter (Signed)
Copied from [place] [id number]. Topic: General - Other >> Nov 15, 2018  4:27 PM Antonieta Iba C wrote: Reason for CRM: pt is returning Brandy's call.

## 2018-11-16 ENCOUNTER — Other Ambulatory Visit: Payer: Self-pay

## 2018-11-16 ENCOUNTER — Ambulatory Visit (INDEPENDENT_AMBULATORY_CARE_PROVIDER_SITE_OTHER): Payer: Medicare Other

## 2018-11-16 ENCOUNTER — Other Ambulatory Visit: Payer: Medicare Other

## 2018-11-16 DIAGNOSIS — J189 Pneumonia, unspecified organism: Secondary | ICD-10-CM

## 2018-11-16 DIAGNOSIS — J181 Lobar pneumonia, unspecified organism: Secondary | ICD-10-CM | POA: Diagnosis not present

## 2018-11-16 DIAGNOSIS — R918 Other nonspecific abnormal finding of lung field: Secondary | ICD-10-CM

## 2018-11-16 NOTE — Addendum Note (Signed)
Addended by: [name] on: 11/16/2018 03:18 PM   Modules accepted: Orders

## 2018-11-21 ENCOUNTER — Ambulatory Visit (INDEPENDENT_AMBULATORY_CARE_PROVIDER_SITE_OTHER)
Admission: RE | Admit: 2018-11-21 | Discharge: 2018-11-21 | Disposition: A | Discharge status: Home / Self Care | Payer: Medicare Other | Source: Ambulatory Visit | Attending: Family Medicine | Admitting: Family Medicine

## 2018-11-21 ENCOUNTER — Other Ambulatory Visit: Payer: Self-pay

## 2018-11-21 DIAGNOSIS — J189 Pneumonia, unspecified organism: Secondary | ICD-10-CM | POA: Diagnosis not present

## 2018-11-21 DIAGNOSIS — R918 Other nonspecific abnormal finding of lung field: Secondary | ICD-10-CM

## 2018-11-21 NOTE — Addendum Note (Signed)
Addended by: [name] on: 11/21/2018 09:18 PM   Modules accepted: Orders

## 2018-11-23 ENCOUNTER — Other Ambulatory Visit: Payer: Self-pay | Admitting: Family Medicine

## 2018-11-23 ENCOUNTER — Telehealth: Payer: Self-pay | Admitting: Family Medicine

## 2018-11-23 ENCOUNTER — Other Ambulatory Visit: Payer: Self-pay

## 2018-11-23 DIAGNOSIS — R918 Other nonspecific abnormal finding of lung field: Secondary | ICD-10-CM

## 2018-11-23 NOTE — Progress Notes (Signed)
I signed this and informed Stephanie-thanks so much

## 2018-11-23 NOTE — Telephone Encounter (Signed)
Vikie from [facility] called and stated that they had something in their work que for a Pulmonary nodule/mass and they wanted to know if this was for a Biopsy and was meant to be sent to the hospital. Please advise

## 2018-11-23 NOTE — Telephone Encounter (Signed)
See note

## 2018-11-23 NOTE — Progress Notes (Signed)
[name],  Order pending, please review.

## 2018-11-24 ENCOUNTER — Ambulatory Visit: Payer: Medicare Other | Admitting: Family Medicine

## 2018-11-24 DIAGNOSIS — H2513 Age-related nuclear cataract, bilateral: Secondary | ICD-10-CM | POA: Diagnosis not present

## 2018-11-24 DIAGNOSIS — H25013 Cortical age-related cataract, bilateral: Secondary | ICD-10-CM | POA: Diagnosis not present

## 2018-11-24 DIAGNOSIS — H5203 Hypermetropia, bilateral: Secondary | ICD-10-CM | POA: Diagnosis not present

## 2018-11-28 ENCOUNTER — Encounter: Payer: Self-pay | Admitting: Family Medicine

## 2018-11-28 DIAGNOSIS — R911 Solitary pulmonary nodule: Secondary | ICD-10-CM

## 2018-11-28 DIAGNOSIS — R918 Other nonspecific abnormal finding of lung field: Secondary | ICD-10-CM

## 2018-11-28 NOTE — Telephone Encounter (Signed)
[facility] team-just an [place]- I received a call from interventional radiology Dr. Anselm Pancoast- he suggested a pulmonology referral for possible bronchoscopy as well as PET CT scan-he thought this would be more appropriate than starting with CT-guided biopsy.  I called patient and explained this.  Colletta [place] please try to get him into pulmonology as soon as possible.  I would also love if we could get the PET scan as soon as possible I know this sometimes take several weeks but would be great if we can get it done sooner.

## 2018-11-29 NOTE — Telephone Encounter (Signed)
Noted!  Thanks [employer]

## 2018-11-30 ENCOUNTER — Other Ambulatory Visit: Payer: Self-pay

## 2018-11-30 ENCOUNTER — Encounter: Payer: Self-pay | Admitting: Pulmonary Disease

## 2018-11-30 ENCOUNTER — Ambulatory Visit: Payer: Medicare Other | Admitting: Pulmonary Disease

## 2018-11-30 ENCOUNTER — Telehealth: Payer: Self-pay | Admitting: Pulmonary Disease

## 2018-11-30 DIAGNOSIS — R918 Other nonspecific abnormal finding of lung field: Secondary | ICD-10-CM | POA: Diagnosis not present

## 2018-11-30 NOTE — Progress Notes (Signed)
Martin Bailey    [id number]    January 25, 1941  Primary Care Physician:Hunter, [name], MD  Referring Physician: [name], MD [place],  [place] [address]  Chief complaint:   Patient being seen for an abnormal CT scan of the chest showing a right lower lobe mass  HPI:  Recently evaluated for a chronic cough which is had for about 2 years He had recent symptoms of increasing sputum production, felt to have an infectious process for which he had 2 courses of antibiotics Had a chest x-ray performed in June 2019 showing haziness at the right base He was treated for an infection A repeat chest x-ray revealed persistence of the lesion leading to the CT scan of the chest been performed  Cough is productive of clear phlegm Has never had hemoptysis Has had some intentional weight loss, watching his diet  Appetite is good No night sweats  He has a hobby doing woodworking which is done for 40 years Quit smoking about 40 years ago about 20-pack-year smoking history  Father had lung cancer-he smoked  Has no chest pains or chest discomfort  He has noticed that his cough does get better whenever is not at home Recently evaluated for possible reflux by ENT-reflux medications did not help symptoms  Outpatient Encounter Medications as of 11/30/2018  Medication Sig  . amLODipine (NORVASC) 5 MG tablet TAKE 1 TABLET BY MOUTH  DAILY  . atenolol (TENORMIN) 50 MG tablet TAKE 1 TABLET BY MOUTH TWO  TIMES DAILY  . clobetasol (TEMOVATE) 0.05 % external solution Apply 1 application topically 2 (two) times daily. For seborrheic dermatitis- 7 days maximum  . doxycycline (VIBRA-TABS) 100 MG tablet Take 1 tablet (100 mg total) by mouth 2 (two) times daily.  . fluticasone (FLONASE) 50 MCG/ACT nasal spray Place 2 sprays into both nostrils daily.  . naproxen (EC NAPROSYN) 500 MG EC tablet Take 1 tablet (500 mg total) by mouth 2 (two) times daily with a meal. Very sparing use  for shoulder (knows risk with xarelto)  . selenium sulfide (SELSUN) 2.5 % shampoo Apply 1 application topically daily as needed for irritation.  . triamcinolone cream (KENALOG) 0.5 % Apply topically 2 (two) times daily as needed.  Alveda Reasons 20 MG TABS tablet TAKE 1 TABLET BY MOUTH  DAILY WITH SUPPER   No facility-administered encounter medications on file as of 11/30/2018.     Allergies as of 11/30/2018  . (No Known Allergies)    Past Medical History:  Diagnosis Date  . COLONIC POLYPS, HX OF 04/05/2007   ONE ADENOMATOUS POLYP AND ONE HYPERPLASTIC POLYP  . DEPRESSION 03/04/2007  . DERMATITIS, SEBORRHEIC 07/19/2007  . FIBRILLATION, ATRIAL 03/04/2007  . HYPERLIPIDEMIA 03/04/2007  . HYPERTENSION 03/04/2007  . Kidney stones     Past Surgical History:  Procedure Laterality Date  . COLONOSCOPY    . RADIOFREQUENCY ABLATION     x2 for fib, on 2nd attempt switched form a flutter ot a fib.   Marland Kitchen ROTATOR CUFF REPAIR Right   . TONSILLECTOMY      Family History  Problem Relation Age of Onset  . Osteoporosis Mother   . Dementia Mother   . Lung cancer Father        former smoker  . Colon cancer Neg Hx   . Stomach cancer Neg Hx   . Esophageal cancer Neg Hx   . Rectal cancer Neg Hx     Social  History   Socioeconomic History  . Marital status: Widowed    Spouse name: Not on file  . Number of children: 1  . Years of education: Not on file  . Highest education level: Not on file  Occupational History  . Occupation: retired  Scientific laboratory technician  . Financial resource strain: Not on file  . Food insecurity    Worry: Not on file    Inability: Not on file  . Transportation needs    Medical: Not on file    Non-medical: Not on file  Tobacco Use  . Smoking status: Former Smoker    Packs/day: 1.50    Years: 10.00    Pack years: 15.00    Types: Cigarettes    Quit date: 07/08/1977    Years since quitting: 41.4  . Smokeless tobacco: Never Used  Substance and Sexual Activity  . Alcohol use: Yes     Alcohol/week: 42.0 standard drinks    Types: 42 Standard drinks or equivalent per week    Comment: 6 scotch's a day  . Drug use: No  . Sexual activity: Not on file  Lifestyle  . Physical activity    Days per week: Not on file    Minutes per session: Not on file  . Stress: Not on file  Relationships  . Social Herbalist on phone: Not on file    Gets together: Not on file    Attends religious service: Not on file    Active member of club or organization: Not on file    Attends meetings of clubs or organizations: Not on file    Relationship status: Not on file  . Intimate partner violence    Fear of current or ex partner: Not on file    Emotionally abused: Not on file    Physically abused: Not on file    Forced sexual activity: Not on file  Other Topics Concern  . Not on file  Social History Narrative   Widower-wife died 3. 1 daughter. 2 grandkids. 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       Hobbies: golf, bridge, computer, sports, woodworking    Review of Systems  Constitutional: Negative.   HENT: Negative.   Eyes: Negative.   Respiratory: Positive for cough. Negative for shortness of breath.   Cardiovascular: Negative.   Gastrointestinal: Negative.   Endocrine: Negative.     Vitals:   11/30/18 1019 11/30/18 1020  BP:  118/68  Pulse:  77  Temp: 97.6 F (36.4 C)   SpO2:  99%     Physical Exam  Constitutional: He appears well-developed and well-nourished.  HENT:  Head: Normocephalic and atraumatic.  Eyes: Conjunctivae are normal. Right eye exhibits no discharge. Left eye exhibits no discharge.  Neck: Normal range of motion. Neck supple. No tracheal deviation present. No thyromegaly present.  Cardiovascular: Normal rate and regular rhythm.  Pulmonary/Chest: Effort normal and breath sounds normal. No respiratory distress. He  has no wheezes. He has no rales.  Abdominal: Soft. Bowel sounds are normal. He exhibits no distension. There is no abdominal tenderness. There is no rebound.     Data Reviewed: CT scan of the chest reviewed with the patient-right lower lobe mass  Assessment:  Lung mass -Concern for non-small cell lung cancer -This is likely contributing to his cough  Past history of smoking  Chronic cough -Symptomatic management  Plan/Recommendations: Further evaluation of this mass will include CT-guided biopsy to have good yield, small risk of pneumothorax  A PET scan-to assess for any activity elsewhere  Pulmonary function study to assess lung function  We will follow-up with him in the office in about 4 to 6 weeks  Sherrilyn Rist MD [facility] 11/30/2018, 10:42 AM  CC: [name], MD

## 2018-11-30 NOTE — H&P (View-Only) (Signed)
Martin Bailey    [id number]    30-Nov-1940  Primary Care Physician:Hunter, [name], MD  Referring Physician: [name], MD [place],  [place] [address]  Chief complaint:   Patient being seen for an abnormal CT scan of the chest showing a right lower lobe mass  HPI:  Recently evaluated for a chronic cough which is had for about 2 years He had recent symptoms of increasing sputum production, felt to have an infectious process for which he had 2 courses of antibiotics Had a chest x-ray performed in June 2019 showing haziness at the right base He was treated for an infection A repeat chest x-ray revealed persistence of the lesion leading to the CT scan of the chest been performed  Cough is productive of clear phlegm Has never had hemoptysis Has had some intentional weight loss, watching his diet  Appetite is good No night sweats  He has a hobby doing woodworking which is done for 40 years Quit smoking about 40 years ago about 20-pack-year smoking history  Father had lung cancer-he smoked  Has no chest pains or chest discomfort  He has noticed that his cough does get better whenever is not at home Recently evaluated for possible reflux by ENT-reflux medications did not help symptoms  Outpatient Encounter Medications as of 11/30/2018  Medication Sig  . amLODipine (NORVASC) 5 MG tablet TAKE 1 TABLET BY MOUTH  DAILY  . atenolol (TENORMIN) 50 MG tablet TAKE 1 TABLET BY MOUTH TWO  TIMES DAILY  . clobetasol (TEMOVATE) 0.05 % external solution Apply 1 application topically 2 (two) times daily. For seborrheic dermatitis- 7 days maximum  . doxycycline (VIBRA-TABS) 100 MG tablet Take 1 tablet (100 mg total) by mouth 2 (two) times daily.  . fluticasone (FLONASE) 50 MCG/ACT nasal spray Place 2 sprays into both nostrils daily.  . naproxen (EC NAPROSYN) 500 MG EC tablet Take 1 tablet (500 mg total) by mouth 2 (two) times daily with a meal. Very sparing use  for shoulder (knows risk with xarelto)  . selenium sulfide (SELSUN) 2.5 % shampoo Apply 1 application topically daily as needed for irritation.  . triamcinolone cream (KENALOG) 0.5 % Apply topically 2 (two) times daily as needed.  Alveda Reasons 20 MG TABS tablet TAKE 1 TABLET BY MOUTH  DAILY WITH SUPPER   No facility-administered encounter medications on file as of 11/30/2018.     Allergies as of 11/30/2018  . (No Known Allergies)    Past Medical History:  Diagnosis Date  . COLONIC POLYPS, HX OF 04/05/2007   ONE ADENOMATOUS POLYP AND ONE HYPERPLASTIC POLYP  . DEPRESSION 03/04/2007  . DERMATITIS, SEBORRHEIC 07/19/2007  . FIBRILLATION, ATRIAL 03/04/2007  . HYPERLIPIDEMIA 03/04/2007  . HYPERTENSION 03/04/2007  . Kidney stones     Past Surgical History:  Procedure Laterality Date  . COLONOSCOPY    . RADIOFREQUENCY ABLATION     x2 for fib, on 2nd attempt switched form a flutter ot a fib.   Marland Kitchen ROTATOR CUFF REPAIR Right   . TONSILLECTOMY      Family History  Problem Relation Age of Onset  . Osteoporosis Mother   . Dementia Mother   . Lung cancer Father        former smoker  . Colon cancer Neg Hx   . Stomach cancer Neg Hx   . Esophageal cancer Neg Hx   . Rectal cancer Neg Hx     Social  History   Socioeconomic History  . Marital status: Widowed    Spouse name: Not on file  . Number of children: 1  . Years of education: Not on file  . Highest education level: Not on file  Occupational History  . Occupation: retired  Scientific laboratory technician  . Financial resource strain: Not on file  . Food insecurity    Worry: Not on file    Inability: Not on file  . Transportation needs    Medical: Not on file    Non-medical: Not on file  Tobacco Use  . Smoking status: Former Smoker    Packs/day: 1.50    Years: 10.00    Pack years: 15.00    Types: Cigarettes    Quit date: 07/08/1977    Years since quitting: 41.4  . Smokeless tobacco: Never Used  Substance and Sexual Activity  . Alcohol use: Yes     Alcohol/week: 42.0 standard drinks    Types: 42 Standard drinks or equivalent per week    Comment: 6 scotch's a day  . Drug use: No  . Sexual activity: Not on file  Lifestyle  . Physical activity    Days per week: Not on file    Minutes per session: Not on file  . Stress: Not on file  Relationships  . Social Herbalist on phone: Not on file    Gets together: Not on file    Attends religious service: Not on file    Active member of club or organization: Not on file    Attends meetings of clubs or organizations: Not on file    Relationship status: Not on file  . Intimate partner violence    Fear of current or ex partner: Not on file    Emotionally abused: Not on file    Physically abused: Not on file    Forced sexual activity: Not on file  Other Topics Concern  . Not on file  Social History Narrative   Widower-wife died 5. 1 daughter. 2 grandkids. 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       Hobbies: golf, bridge, computer, sports, woodworking    Review of Systems  Constitutional: Negative.   HENT: Negative.   Eyes: Negative.   Respiratory: Positive for cough. Negative for shortness of breath.   Cardiovascular: Negative.   Gastrointestinal: Negative.   Endocrine: Negative.     Vitals:   11/30/18 1019 11/30/18 1020  BP:  118/68  Pulse:  77  Temp: 97.6 F (36.4 C)   SpO2:  99%     Physical Exam  Constitutional: He appears well-developed and well-nourished.  HENT:  Head: Normocephalic and atraumatic.  Eyes: Conjunctivae are normal. Right eye exhibits no discharge. Left eye exhibits no discharge.  Neck: Normal range of motion. Neck supple. No tracheal deviation present. No thyromegaly present.  Cardiovascular: Normal rate and regular rhythm.  Pulmonary/Chest: Effort normal and breath sounds normal. No respiratory distress. He  has no wheezes. He has no rales.  Abdominal: Soft. Bowel sounds are normal. He exhibits no distension. There is no abdominal tenderness. There is no rebound.     Data Reviewed: CT scan of the chest reviewed with the patient-right lower lobe mass  Assessment:  Lung mass -Concern for non-small cell lung cancer -This is likely contributing to his cough  Past history of smoking  Chronic cough -Symptomatic management  Plan/Recommendations: Further evaluation of this mass will include CT-guided biopsy to have good yield, small risk of pneumothorax  A PET scan-to assess for any activity elsewhere  Pulmonary function study to assess lung function  We will follow-up with him in the office in about 4 to 6 weeks  Sherrilyn Rist MD [facility] 11/30/2018, 10:42 AM  CC: [name], MD

## 2018-11-30 NOTE — Patient Instructions (Addendum)
Mass in the lungs concerning for cancer  We will set you up for a breathing study CT-guided biopsy of the growth  A PET scan to assess activity  We will see you back in the office in 4 to 6 weeks

## 2018-11-30 NOTE — Addendum Note (Signed)
Addended by: Hildred Alamin I on: 11/30/2018 11:24 AM   Modules accepted: Orders

## 2018-11-30 NOTE — Telephone Encounter (Signed)
PET scan has been cancelled.

## 2018-12-02 ENCOUNTER — Encounter (HOSPITAL_COMMUNITY)
Admission: RE | Admit: 2018-12-02 | Discharge: 2018-12-02 | Disposition: A | Discharge status: Home / Self Care | Payer: Medicare Other | Source: Ambulatory Visit | Attending: Family Medicine | Admitting: Family Medicine

## 2018-12-02 ENCOUNTER — Other Ambulatory Visit: Payer: Self-pay

## 2018-12-02 DIAGNOSIS — I517 Cardiomegaly: Secondary | ICD-10-CM | POA: Insufficient documentation

## 2018-12-02 DIAGNOSIS — I2510 Atherosclerotic heart disease of native coronary artery without angina pectoris: Secondary | ICD-10-CM | POA: Insufficient documentation

## 2018-12-02 DIAGNOSIS — I700 Atherosclerosis of aorta: Secondary | ICD-10-CM | POA: Insufficient documentation

## 2018-12-02 DIAGNOSIS — R918 Other nonspecific abnormal finding of lung field: Secondary | ICD-10-CM | POA: Insufficient documentation

## 2018-12-02 LAB — GLUCOSE, CAPILLARY: Glucose-Capillary: 106 mg/dL — ABNORMAL HIGH (ref 70–99)

## 2018-12-02 MED ORDER — FLUDEOXYGLUCOSE F - 18 (FDG) INJECTION
10.7800 | Freq: Once | INTRAVENOUS | Status: AC | PRN
  Administered 2018-12-02: 14:00:00 10.78 via INTRAVENOUS

## 2018-12-06 DIAGNOSIS — H353131 Nonexudative age-related macular degeneration, bilateral, early dry stage: Secondary | ICD-10-CM | POA: Diagnosis not present

## 2018-12-06 DIAGNOSIS — H43813 Vitreous degeneration, bilateral: Secondary | ICD-10-CM | POA: Diagnosis not present

## 2018-12-06 DIAGNOSIS — H2513 Age-related nuclear cataract, bilateral: Secondary | ICD-10-CM | POA: Diagnosis not present

## 2018-12-06 DIAGNOSIS — H25013 Cortical age-related cataract, bilateral: Secondary | ICD-10-CM | POA: Diagnosis not present

## 2018-12-08 ENCOUNTER — Encounter: Payer: Self-pay | Admitting: Family Medicine

## 2018-12-08 ENCOUNTER — Telehealth: Payer: Self-pay

## 2018-12-08 NOTE — Telephone Encounter (Signed)
[name],   Its that time again for Martin Bailey to have his surveillance colonoscopy. Dr. Loletha Carrow asked me to reach out and get clearance to hold Martin Bailey Xarelto prior to contacting the patient. We would like to hold the Xarelto 2 days prior to the colonoscopy. Please advise.   Thank you,   Vivien Rota, CMA

## 2018-12-08 NOTE — Telephone Encounter (Signed)
I would delay this- patient is undergoing workup for potential lung cancer at the moment

## 2018-12-08 NOTE — Telephone Encounter (Signed)
See below- sorry

## 2018-12-09 ENCOUNTER — Telehealth: Payer: Self-pay | Admitting: Pulmonary Disease

## 2018-12-09 ENCOUNTER — Telehealth: Payer: Self-pay

## 2018-12-09 DIAGNOSIS — R918 Other nonspecific abnormal finding of lung field: Secondary | ICD-10-CM

## 2018-12-09 NOTE — Telephone Encounter (Signed)
Call returned to [facility], she wanted to confirm that AO had reviewed the patient's PET scan. I made her aware that he is not in clinic this week but he would be back on Monday. She states [name] and AO was trying to decide if the patient needed a bronch. Aware note will be sent to AO. Voiced understanding. Nothing further needed at this time.

## 2018-12-09 NOTE — Telephone Encounter (Signed)
Notes recorded by [name], MD on 12/08/2018 at 10:07 PM EDT  Discussed results with Martin Bailey by phone this evening. Patient would like to get the ball rolling on next steps before October 5th visit. We are still awaiting to hear back from pulmonology (forwarding again to Dr. Clearence Cheek can you please reach out to pulmonology and see if we can get an update on next steps? We greatly appreciate this  ------   Notes recorded by Martin Bailey, CMA on 12/08/2018 at 6:39 PM EDT  Viewed by Martin Bailey on 12/06/2018 10:31 PM   Called and spoke with patient. He reviewed results via [place]. He doesn't completely understand the results but he thinks it looks like things weren't as bad as they were originally thinking. He is hoping that [name] can give him a quick call to discuss the results and make sure he understood everything clearly.  ------   Notes recorded by [name], MD on 12/06/2018 at 9:03 PM EDT  Martin Bailey- forwarding PET scan to you. Thank you for caring for Martin Bailey. Interventional radiologist I spoke to suggested considering bronchoscopy but from you note appeared CT guided biopsy preferred- forwarding this to you to help with decision making. I will defer treatment decision on possible pneumonitis to you as well.   Martin Bailey- you also had some plaque on your heart and aorta which does not surprise me with your age- could make [place] consider cholesterol medicine more heavily in future. Your cholesterol levels are not "bad" but we could push for even better numbers. Slightly enlarged heart noted-monitor for symptoms like shortness of breath only for now. There is some signs of stress on the spine as well.

## 2018-12-09 NOTE — Telephone Encounter (Signed)
Called and spoke with pulmonology, they will send message to Dr. Ander Slade.   Update - Per [name] -   "spoke with IR again today- they are going to schedule biopsy and cancel if pulm wants to do bronch instead" and pt is aware.   Will CC Dr. Ander Slade

## 2018-12-09 NOTE — Telephone Encounter (Signed)
Will route message to AO as FYI. Nothing further needed at this time.

## 2018-12-10 ENCOUNTER — Telehealth: Payer: Self-pay | Admitting: Pulmonary Disease

## 2018-12-10 NOTE — Telephone Encounter (Signed)
Bronchoscopy should be bronchoscopy with EBUS, not just bronch with fluoro  Need to aspirate subcarinal LN

## 2018-12-10 NOTE — Telephone Encounter (Signed)
Schedule for bronchoscopy.  Bronchoscopy  with fluoroscopy  In office this week, will require patients to be moved or seen by APP

## 2018-12-10 NOTE — Telephone Encounter (Signed)
Bronchoscopy will be scheduled. Message sent for this to be facilitated

## 2018-12-12 NOTE — Telephone Encounter (Signed)
I just called his home phone to discuss a bronchoscopy, left a message for him to call back the office  I believe we did talk about bronchoscopy during his recent visit  Let us go ahead and schedule a bronchoscopy with EBUS I will try to reach out to him again to see whether we can discuss the procedure further over the phone and go ahead with the scheduled procedure

## 2018-12-12 NOTE — Telephone Encounter (Signed)
Preservice center tried calling patient about procedure reg. on 9/18 pt got a bit irrate with them stated they knew nothing about this procedure

## 2018-12-12 NOTE — Telephone Encounter (Signed)
Pt is returning call to Dr Jenetta Downer.  Please advise.

## 2018-12-12 NOTE — Telephone Encounter (Signed)
Dr. Ander Slade, please clarify do you want to see patient in office this week to discuss the bronchoscopy?  Or do I need to schedule the bronchoscopy?

## 2018-12-12 NOTE — Telephone Encounter (Signed)
Thanks for update

## 2018-12-12 NOTE — Addendum Note (Signed)
Addended by: Amado Coe on: 12/12/2018 10:29 AM   Modules accepted: Orders

## 2018-12-12 NOTE — Telephone Encounter (Signed)
Order placed for EBUS .  Routing to [place] to schedule.

## 2018-12-12 NOTE — Telephone Encounter (Signed)
Called and spoke with Patient.  Patient stated he was unaware of upcoming bronchoscopy.  Patient stated he was scheduled 12/16/18 for bronchoscopy, but has post op cataract surgery appointment.  Patient would like Dr Ander Slade to contact him this afternoon or tomorrow morning, 9/15.    Message routed to Dr. Ander Slade

## 2018-12-12 NOTE — Telephone Encounter (Signed)
Understood, thanks for the update.  Martin Bailey,    Please put him in for recall 6 months from now so we do a chart check then.  - HD

## 2018-12-12 NOTE — Telephone Encounter (Signed)
Forwarding to Dr. Hunter as FYI.  

## 2018-12-13 ENCOUNTER — Other Ambulatory Visit: Payer: Self-pay | Admitting: Family Medicine

## 2018-12-13 ENCOUNTER — Telehealth: Payer: Self-pay | Admitting: Family Medicine

## 2018-12-13 NOTE — Telephone Encounter (Signed)
Discussed procedure with patient  Please schedule bronchoscopy with EBUS for week of 9/21  I am in the office mon and tues- patients will need moved if scheduled on one of these days I am off on Wednesday- will like to have this off May be scheduled for Thursday or Friday- I am in the [facility]

## 2018-12-13 NOTE — Telephone Encounter (Signed)
I left a message asking the patient to call me at [phone] to schedule AWV with Courtney. VDM (Dee-Dee) °

## 2018-12-13 NOTE — Telephone Encounter (Signed)
Patient has bronch scheduled per [facility]

## 2018-12-13 NOTE — Telephone Encounter (Signed)
According to Dr. Ander Slade patient has a conflict for procedures before the 21st.  Will route this to [facility] so she can see when to schedule patient.

## 2018-12-13 NOTE — Telephone Encounter (Signed)
An attempt to reach patient was made. The phone only gives a busy signal. Will try again later.

## 2018-12-14 NOTE — Telephone Encounter (Signed)
Left message to return call 

## 2018-12-15 DIAGNOSIS — H2511 Age-related nuclear cataract, right eye: Secondary | ICD-10-CM | POA: Diagnosis not present

## 2018-12-15 DIAGNOSIS — H25811 Combined forms of age-related cataract, right eye: Secondary | ICD-10-CM | POA: Diagnosis not present

## 2018-12-15 DIAGNOSIS — H25011 Cortical age-related cataract, right eye: Secondary | ICD-10-CM | POA: Diagnosis not present

## 2018-12-20 ENCOUNTER — Other Ambulatory Visit (HOSPITAL_COMMUNITY)
Admission: RE | Admit: 2018-12-20 | Discharge: 2018-12-20 | Disposition: A | Discharge status: Home / Self Care | Payer: Medicare Other | Source: Ambulatory Visit | Attending: Pulmonary Disease | Admitting: Pulmonary Disease

## 2018-12-20 DIAGNOSIS — Z20828 Contact with and (suspected) exposure to other viral communicable diseases: Secondary | ICD-10-CM | POA: Diagnosis not present

## 2018-12-20 DIAGNOSIS — Z01812 Encounter for preprocedural laboratory examination: Secondary | ICD-10-CM | POA: Insufficient documentation

## 2018-12-21 ENCOUNTER — Telehealth: Payer: Self-pay | Admitting: Pulmonary Disease

## 2018-12-21 ENCOUNTER — Encounter: Payer: Self-pay | Admitting: Family Medicine

## 2018-12-21 LAB — NOVEL CORONAVIRUS, NAA (HOSP ORDER, SEND-OUT TO REF LAB; TAT 18-24 HRS): SARS-CoV-2, NAA: NOT DETECTED

## 2018-12-21 NOTE — Telephone Encounter (Signed)
Called and spoke to patient. Patient has not been called about what time he needs to be where on 12/23/2018 for bronch.  Patient stated he would like information about where he needs to go, anesthesia, and any precautions he needs to take.  Currently respiratory is closed for the day.  Will hold message in triage for tomorrow.

## 2018-12-22 ENCOUNTER — Telehealth: Payer: Self-pay | Admitting: Pulmonary Disease

## 2018-12-22 ENCOUNTER — Encounter (HOSPITAL_COMMUNITY): Payer: Self-pay | Admitting: Physician Assistant

## 2018-12-22 ENCOUNTER — Other Ambulatory Visit: Payer: Self-pay | Admitting: Pulmonary Disease

## 2018-12-22 ENCOUNTER — Encounter (HOSPITAL_COMMUNITY): Payer: Self-pay | Admitting: *Deleted

## 2018-12-22 ENCOUNTER — Other Ambulatory Visit: Payer: Self-pay

## 2018-12-22 NOTE — Progress Notes (Signed)
Pt denies SOB, chest pain, and being under the care of a cardiologist. Pt denies having a stress test and cardiac cath. Pt stated that his PCP is Dr. Garret Reddish. Pt denies having an EKG in the last year. Pt denies recent labs. Pt stated that he was not provided with pre-op instructions regarding Xarelto. Pt stated that he did not take Xarelto today " my last dose was Wednesday . "  Pt stated that he was waiting for a return call from surgeon's office for instructions. Pt made aware to stop taking vitamins, fish oil and herbal medications. Do not take any NSAIDs ie: Ibuprofen, Advil, Naproxen (Aleve), Motrin, BC and Goody Powder. Pt verbalized understanding of all pre-op instructions. Nurse emailed surgeon to make aware that is taking Xarelto and needs pre-op instructions. PA, Anesthesiology, made aware of pt history; see note.

## 2018-12-22 NOTE — Telephone Encounter (Signed)
Left message to return call. Self reminder to follow up in a few months has been made.

## 2018-12-22 NOTE — Telephone Encounter (Signed)
Pt is having an EBUS which is in the OR, scheduled at [facility] on 12/23/18. Called and spoke to pt. He still has not heard from [facility] about recs prior to the procedure. Called pre-admission testing at [phone] and left a detailed VM. Called and spoke to pt to inform him we will call him back after speaking with someone.

## 2018-12-22 NOTE — Anesthesia Preprocedure Evaluation (Deleted)
Anesthesia Evaluation    Airway        Dental   Pulmonary former smoker,           Cardiovascular hypertension,      Neuro/Psych    GI/Hepatic   Endo/Other    Renal/GU      Musculoskeletal   Abdominal   Peds  Hematology   Anesthesia Other Findings   Reproductive/Obstetrics                             Anesthesia Physical Anesthesia Plan  ASA:   Anesthesia Plan:    Post-op Pain Management:    Induction:   PONV Risk Score and Plan:   Airway Management Planned:   Additional Equipment:   Intra-op Plan:   Post-operative Plan:   Informed Consent:   Plan Discussed with:   Anesthesia Plan Comments: (Persitent atrial fibrillation followed by PCP [name]. Rate controlled with atenolol and anticoagulated with xarelto. Hx of mod-heavy etoh use, 4-6 scotchs per day according to notes.  )        Anesthesia Quick Evaluation

## 2018-12-22 NOTE — Telephone Encounter (Signed)
Martin Bailey was scheduled for bronchoscopy for 12/23/2018  Oversight of not informing him to stop his Xarelto He did take it 9/23  We need to cancel the procedure for tomorrow which I did inform the patient about  He will email me about options available to him-to reschedule the procedure   Kindly message me to let me know this has been facilitated

## 2018-12-23 ENCOUNTER — Ambulatory Visit (HOSPITAL_COMMUNITY): Admission: RE | Admit: 2018-12-23 | Payer: Medicare Other | Source: Home / Self Care | Admitting: Pulmonary Disease

## 2018-12-23 ENCOUNTER — Other Ambulatory Visit: Payer: Self-pay | Admitting: Pulmonary Disease

## 2018-12-23 ENCOUNTER — Telehealth: Payer: Self-pay | Admitting: Pulmonary Disease

## 2018-12-23 HISTORY — DX: Unspecified cataract: H26.9

## 2018-12-23 HISTORY — DX: Presence of external hearing-aid: Z97.4

## 2018-12-23 SURGERY — BRONCHOSCOPY, WITH EBUS
Anesthesia: Monitor Anesthesia Care

## 2018-12-23 NOTE — Telephone Encounter (Signed)
Trying to set up this appt on 12/30/18 Martin Bailey

## 2018-12-23 NOTE — Telephone Encounter (Signed)
Called patient and relayed appointment date and time.  Scheduled patient for pre-procedure covid testing.  Advised patient to hold Xarelto per Dr. Ander Slade.  Patient verbalized understanding. Nothing further needed at this time.

## 2018-12-23 NOTE — Telephone Encounter (Signed)
10/2 will work  He has to hold xarelto for 5 days- okay for procedure on 5th day

## 2018-12-23 NOTE — Telephone Encounter (Signed)
Attempted to call respiratory therapy, no answer at either number, multiple times.  Left a voicemail.   I do see the procedure is canceled via Epic.   Dr. Ander Slade please let us know what we can further do to help!  Thank you!

## 2018-12-23 NOTE — Telephone Encounter (Signed)
Dr. Ander Slade just want to make sure everything is good on your end before I call the patient and give information on EBUS for 12/30/2018.   Thank you!

## 2018-12-23 NOTE — Telephone Encounter (Signed)
Bronchoscopy that was scheduled for today had to be cancelled as he was not advised to hold his Xarelto   He was to email me with days that may work for him  Can we reach out to him to reschedule his bronchoscopy with EBUS Needs to hold anticoagulants for 5 days- procedure can be done on the 5th hold day  I am flexible with when it is scheduled for

## 2018-12-23 NOTE — Telephone Encounter (Signed)
Patient is schedule 12/27/18 for preprocedure screening. EBUS is scheduled for 12/30/18. Nothing further at this time.

## 2018-12-23 NOTE — Telephone Encounter (Signed)
Ok EBUS@[facility]  12/30/18@10 :00am Dr Ander Slade pt will receive call with instructions he should be prepared to be there around 8-8:30am let me know if I need to do anything else Joellen Jersey

## 2018-12-23 NOTE — Telephone Encounter (Signed)
Called and spoke to patient.  Patient was able to give available dates and reported he emailed Dr. Ander Slade.  Dates available: 9/30, 10/2, 10/5, 10/6, 10/7  Patient would like to know as soon as possible the date the bronchoscopy with EBUS is scheduled so he will know about holding his Xarelto.   Libby please let us know what works for OR schedule please. Thank you!

## 2018-12-27 ENCOUNTER — Other Ambulatory Visit (HOSPITAL_COMMUNITY)
Admission: RE | Admit: 2018-12-27 | Discharge: 2018-12-27 | Disposition: A | Discharge status: Home / Self Care | Payer: Medicare Other | Source: Ambulatory Visit | Attending: Pulmonary Disease | Admitting: Pulmonary Disease

## 2018-12-27 ENCOUNTER — Other Ambulatory Visit (HOSPITAL_COMMUNITY): Payer: Medicare Other

## 2018-12-27 DIAGNOSIS — Z01812 Encounter for preprocedural laboratory examination: Secondary | ICD-10-CM | POA: Insufficient documentation

## 2018-12-27 DIAGNOSIS — Z20828 Contact with and (suspected) exposure to other viral communicable diseases: Secondary | ICD-10-CM | POA: Insufficient documentation

## 2018-12-28 LAB — NOVEL CORONAVIRUS, NAA (HOSP ORDER, SEND-OUT TO REF LAB; TAT 18-24 HRS): SARS-CoV-2, NAA: NOT DETECTED

## 2018-12-29 ENCOUNTER — Encounter (HOSPITAL_COMMUNITY): Payer: Self-pay | Admitting: *Deleted

## 2018-12-29 NOTE — Progress Notes (Signed)
Pt updated with new arrival time of 0750. Pt stated that " everything is the same except my last dose of Xarelto was Saturday. Pt verbalized understanding of all pre-op instructions.

## 2018-12-30 ENCOUNTER — Other Ambulatory Visit: Payer: Self-pay

## 2018-12-30 ENCOUNTER — Ambulatory Visit (HOSPITAL_COMMUNITY): Payer: Medicare Other

## 2018-12-30 ENCOUNTER — Encounter (HOSPITAL_COMMUNITY)
Admission: RE | Disposition: A | Discharge status: Home / Self Care | Payer: Self-pay | Source: Home / Self Care | Attending: Pulmonary Disease

## 2018-12-30 ENCOUNTER — Encounter (HOSPITAL_COMMUNITY): Payer: Self-pay | Admitting: Surgery

## 2018-12-30 ENCOUNTER — Ambulatory Visit (HOSPITAL_COMMUNITY): Payer: Medicare Other | Admitting: Anesthesiology

## 2018-12-30 ENCOUNTER — Ambulatory Visit (HOSPITAL_COMMUNITY)
Admission: RE | Admit: 2018-12-30 | Discharge: 2018-12-30 | Disposition: A | Discharge status: Home / Self Care | Payer: Medicare Other | Attending: Pulmonary Disease | Admitting: Pulmonary Disease

## 2018-12-30 DIAGNOSIS — Z801 Family history of malignant neoplasm of trachea, bronchus and lung: Secondary | ICD-10-CM | POA: Diagnosis not present

## 2018-12-30 DIAGNOSIS — R918 Other nonspecific abnormal finding of lung field: Secondary | ICD-10-CM | POA: Insufficient documentation

## 2018-12-30 DIAGNOSIS — Z79899 Other long term (current) drug therapy: Secondary | ICD-10-CM | POA: Insufficient documentation

## 2018-12-30 DIAGNOSIS — Z1159 Encounter for screening for other viral diseases: Secondary | ICD-10-CM | POA: Diagnosis not present

## 2018-12-30 DIAGNOSIS — Z7901 Long term (current) use of anticoagulants: Secondary | ICD-10-CM | POA: Diagnosis not present

## 2018-12-30 DIAGNOSIS — R590 Localized enlarged lymph nodes: Secondary | ICD-10-CM | POA: Diagnosis not present

## 2018-12-30 DIAGNOSIS — Z6827 Body mass index (BMI) 27.0-27.9, adult: Secondary | ICD-10-CM | POA: Insufficient documentation

## 2018-12-30 DIAGNOSIS — I4891 Unspecified atrial fibrillation: Secondary | ICD-10-CM | POA: Insufficient documentation

## 2018-12-30 DIAGNOSIS — D0221 Carcinoma in situ of right bronchus and lung: Secondary | ICD-10-CM | POA: Insufficient documentation

## 2018-12-30 DIAGNOSIS — I10 Essential (primary) hypertension: Secondary | ICD-10-CM | POA: Diagnosis not present

## 2018-12-30 DIAGNOSIS — I898 Other specified noninfective disorders of lymphatic vessels and lymph nodes: Secondary | ICD-10-CM | POA: Diagnosis not present

## 2018-12-30 DIAGNOSIS — Z419 Encounter for procedure for purposes other than remedying health state, unspecified: Secondary | ICD-10-CM

## 2018-12-30 DIAGNOSIS — R846 Abnormal cytological findings in specimens from respiratory organs and thorax: Secondary | ICD-10-CM | POA: Diagnosis not present

## 2018-12-30 DIAGNOSIS — Z87891 Personal history of nicotine dependence: Secondary | ICD-10-CM | POA: Diagnosis not present

## 2018-12-30 DIAGNOSIS — Z9889 Other specified postprocedural states: Secondary | ICD-10-CM

## 2018-12-30 HISTORY — PX: VIDEO BRONCHOSCOPY WITH ENDOBRONCHIAL ULTRASOUND: SHX6177

## 2018-12-30 HISTORY — DX: Other nonspecific abnormal finding of lung field: R91.8

## 2018-12-30 LAB — COMPREHENSIVE METABOLIC PANEL
ALT: 11 U/L (ref 0–44)
AST: 33 U/L (ref 15–41)
Albumin: 3.2 g/dL — ABNORMAL LOW (ref 3.5–5.0)
Alkaline Phosphatase: 69 U/L (ref 38–126)
Anion gap: 9 (ref 5–15)
BUN: 14 mg/dL (ref 8–23)
CO2: 22 mmol/L (ref 22–32)
Calcium: 8.4 mg/dL — ABNORMAL LOW (ref 8.9–10.3)
Chloride: 103 mmol/L (ref 98–111)
Creatinine, Ser: 0.95 mg/dL (ref 0.61–1.24)
GFR calc Af Amer: >60 mL/min (ref >60)
GFR calc non Af Amer: >60 mL/min (ref >60)
Glucose, Bld: 107 mg/dL — ABNORMAL HIGH (ref 70–99)
Potassium: 4.8 mmol/L (ref 3.5–5.1)
Sodium: 134 mmol/L — ABNORMAL LOW (ref 135–145)
Total Bilirubin: 1.3 mg/dL — ABNORMAL HIGH (ref 0.3–1.2)
Total Protein: 7.3 g/dL (ref 6.5–8.1)

## 2018-12-30 LAB — CBC
HCT: 39.9 % (ref 39.0–52.0)
Hemoglobin: 12.9 g/dL — ABNORMAL LOW (ref 13.0–17.0)
MCH: 29.4 pg (ref 26.0–34.0)
MCHC: 32.3 g/dL (ref 30.0–36.0)
MCV: 90.9 fL (ref 80.0–100.0)
Platelets: 179 10*3/uL (ref 150–400)
RBC: 4.39 MIL/uL (ref 4.22–5.81)
RDW: 13.2 % (ref 11.5–15.5)
WBC: 8.2 10*3/uL (ref 4.0–10.5)
nRBC: 0 % (ref 0.0–0.2)

## 2018-12-30 LAB — PROTIME-INR
INR: 1.1 (ref 0.8–1.2)
Prothrombin Time: 13.6 seconds (ref 11.4–15.2)

## 2018-12-30 SURGERY — BRONCHOSCOPY, WITH EBUS
Anesthesia: General | Laterality: Left

## 2018-12-30 MED ORDER — PHENYLEPHRINE HCL (PRESSORS) 10 MG/ML IV SOLN
INTRAVENOUS | Status: AC
  Filled 2018-12-30: qty 1

## 2018-12-30 MED ORDER — AMLODIPINE BESYLATE 5 MG PO TABS: 5.0000 mg | ORAL_TABLET | Freq: Every day | ORAL | Status: DC

## 2018-12-30 MED ORDER — PROPOFOL 10 MG/ML IV BOLUS
INTRAVENOUS | Status: AC
  Filled 2018-12-30: qty 20

## 2018-12-30 MED ORDER — PREDNISOLONE ACETATE 1 % OP SUSP: 1.0000 [drp] | Freq: Four times a day (QID) | OPHTHALMIC | Status: DC

## 2018-12-30 MED ORDER — DEXAMETHASONE SODIUM PHOSPHATE 10 MG/ML IJ SOLN
INTRAMUSCULAR | Status: AC
  Filled 2018-12-30: qty 1

## 2018-12-30 MED ORDER — LIDOCAINE 2% (20 MG/ML) 5 ML SYRINGE
INTRAMUSCULAR | Status: AC
  Filled 2018-12-30: qty 5

## 2018-12-30 MED ORDER — ROCURONIUM BROMIDE 10 MG/ML (PF) SYRINGE
PREFILLED_SYRINGE | INTRAVENOUS | Status: AC
  Filled 2018-12-30: qty 10

## 2018-12-30 MED ORDER — ONDANSETRON HCL 4 MG/2ML IJ SOLN
INTRAMUSCULAR | Status: AC
  Filled 2018-12-30: qty 2

## 2018-12-30 MED ORDER — PROPOFOL 10 MG/ML IV BOLUS
INTRAVENOUS | Status: DC | PRN
  Administered 2018-12-30: 120 mg via INTRAVENOUS

## 2018-12-30 MED ORDER — EPINEPHRINE PF 1 MG/ML IJ SOLN
INTRAMUSCULAR | Status: AC
  Filled 2018-12-30: qty 1

## 2018-12-30 MED ORDER — FENTANYL CITRATE (PF) 250 MCG/5ML IJ SOLN
INTRAMUSCULAR | Status: AC
  Filled 2018-12-30: qty 5

## 2018-12-30 MED ORDER — RIVAROXABAN 20 MG PO TABS: 20.0000 mg | ORAL_TABLET | Freq: Every day | ORAL | Status: DC

## 2018-12-30 MED ORDER — DOXYCYCLINE HYCLATE 100 MG PO TABS: 100.0000 mg | ORAL_TABLET | Freq: Two times a day (BID) | ORAL | Status: DC

## 2018-12-30 MED ORDER — PHENYLEPHRINE 40 MCG/ML (10ML) SYRINGE FOR IV PUSH (FOR BLOOD PRESSURE SUPPORT)
PREFILLED_SYRINGE | INTRAVENOUS | Status: DC | PRN
  Administered 2018-12-30: 160 ug via INTRAVENOUS
  Administered 2018-12-30: 80 ug via INTRAVENOUS
  Administered 2018-12-30: 160 ug via INTRAVENOUS

## 2018-12-30 MED ORDER — PHENYLEPHRINE HCL 0.25 % NA SOLN: 1.0000 | Freq: Four times a day (QID) | NASAL | Status: DC | PRN

## 2018-12-30 MED ORDER — DEXAMETHASONE SODIUM PHOSPHATE 10 MG/ML IJ SOLN
INTRAMUSCULAR | Status: DC | PRN
  Administered 2018-12-30: 5 mg via INTRAVENOUS

## 2018-12-30 MED ORDER — FENTANYL CITRATE (PF) 100 MCG/2ML IJ SOLN: 25.0000 ug | INTRAMUSCULAR | Status: DC | PRN

## 2018-12-30 MED ORDER — 0.9 % SODIUM CHLORIDE (POUR BTL) OPTIME
TOPICAL | Status: DC | PRN
  Administered 2018-12-30: 13:00:00 1000 mL

## 2018-12-30 MED ORDER — TRIAMCINOLONE ACETONIDE 0.5 % EX CREA: 1.0000 "application " | TOPICAL_CREAM | Freq: Two times a day (BID) | CUTANEOUS | Status: DC | PRN

## 2018-12-30 MED ORDER — LIDOCAINE 2% (20 MG/ML) 5 ML SYRINGE
INTRAMUSCULAR | Status: DC | PRN
  Administered 2018-12-30: 30 mg via INTRAVENOUS

## 2018-12-30 MED ORDER — ATENOLOL 50 MG PO TABS: 50.0000 mg | ORAL_TABLET | Freq: Two times a day (BID) | ORAL | Status: DC

## 2018-12-30 MED ORDER — FENTANYL CITRATE (PF) 250 MCG/5ML IJ SOLN
INTRAMUSCULAR | Status: DC | PRN
  Administered 2018-12-30: 100 ug via INTRAVENOUS

## 2018-12-30 MED ORDER — LACTATED RINGERS IV SOLN
INTRAVENOUS | Status: DC
  Administered 2018-12-30: 12:00:00 via INTRAVENOUS

## 2018-12-30 MED ORDER — ADULT MULTIVITAMIN W/MINERALS CH: 1.0000 | ORAL_TABLET | Freq: Every day | ORAL | Status: DC

## 2018-12-30 MED ORDER — ROCURONIUM BROMIDE 10 MG/ML (PF) SYRINGE
PREFILLED_SYRINGE | INTRAVENOUS | Status: DC | PRN
  Administered 2018-12-30: 60 mg via INTRAVENOUS

## 2018-12-30 MED ORDER — ONDANSETRON HCL 4 MG/2ML IJ SOLN
INTRAMUSCULAR | Status: DC | PRN
  Administered 2018-12-30: 4 mg via INTRAVENOUS

## 2018-12-30 MED ORDER — SODIUM CHLORIDE 0.9 % IV SOLN
INTRAVENOUS | Status: DC | PRN
  Administered 2018-12-30: 13:00:00 50 ug/min via INTRAVENOUS

## 2018-12-30 MED ORDER — FLUTICASONE PROPIONATE 50 MCG/ACT NA SUSP: 2.0000 | Freq: Every day | NASAL | Status: DC

## 2018-12-30 MED ORDER — CLOBETASOL PROPIONATE 0.05 % EX SOLN: 1.0000 "application " | Freq: Two times a day (BID) | CUTANEOUS | Status: DC | PRN

## 2018-12-30 MED ORDER — LIDOCAINE HCL URETHRAL/MUCOSAL 2 % EX GEL: 1.0000 "application " | Freq: Once | CUTANEOUS | Status: DC

## 2018-12-30 MED ORDER — PHENYLEPHRINE 40 MCG/ML (10ML) SYRINGE FOR IV PUSH (FOR BLOOD PRESSURE SUPPORT)
PREFILLED_SYRINGE | INTRAVENOUS | Status: AC
  Filled 2018-12-30: qty 10

## 2018-12-30 MED ORDER — SUGAMMADEX SODIUM 200 MG/2ML IV SOLN
INTRAVENOUS | Status: DC | PRN
  Administered 2018-12-30: 200 mg via INTRAVENOUS

## 2018-12-30 SURGICAL SUPPLY — 46 items
ADAPTER VALVE BIOPSY EBUS (MISCELLANEOUS) IMPLANT
ADPTR VALVE BIOPSY EBUS (MISCELLANEOUS)
BALLN FOR EBUS SCOPE (BALLOONS) ×3
BALLOON FOR EBUS SCOPE (BALLOONS) IMPLANT
BALN ESCP STRL LXBF-UC160F (BALLOONS) ×1
BRUSH BIOPSY BRONCH 10 SDTNB (MISCELLANEOUS) ×1 IMPLANT
BRUSH BIOPSY BRONCH 10MM SDTNB (MISCELLANEOUS) ×1
BRUSH CYTOL CELLEBRITY 1.5X140 (MISCELLANEOUS) IMPLANT
BRUSH SUPERTRAX NDL-TIP CYTO (INSTRUMENTS) ×2 IMPLANT
CANISTER SUCT 3000ML PPV (MISCELLANEOUS) ×3 IMPLANT
CONT SPEC 4OZ CLIKSEAL STRL BL (MISCELLANEOUS) ×3 IMPLANT
COVER BACK TABLE 60X90IN (DRAPES) ×3 IMPLANT
FORCEPS BIOP RJ4 1.8 (CUTTING FORCEPS) IMPLANT
FORCEPS BIOP SUPERTRX PREMAR (INSTRUMENTS) ×2 IMPLANT
GAUZE SPONGE 4X4 12PLY STRL (GAUZE/BANDAGES/DRESSINGS) ×3 IMPLANT
GLOVE BIO SURGEON STRL SZ8 (GLOVE) ×3 IMPLANT
GLOVE SS BIOGEL STRL SZ 7.5 (GLOVE) IMPLANT
GLOVE SUPERSENSE BIOGEL SZ 7.5 (GLOVE) ×2
GOWN STRL REUS W/ TWL LRG LVL3 (GOWN DISPOSABLE) ×1 IMPLANT
GOWN STRL REUS W/TWL LRG LVL3 (GOWN DISPOSABLE) ×3
KIT CLEAN ENDO COMPLIANCE (KITS) ×6 IMPLANT
KIT TURNOVER KIT B (KITS) ×3 IMPLANT
MARKER SKIN DUAL TIP RULER LAB (MISCELLANEOUS) ×3 IMPLANT
NDL ASPIRATION VIZISHOT 19G (NEEDLE) IMPLANT
NDL ASPIRATION VIZISHOT 21G (NEEDLE) ×1 IMPLANT
NEEDLE ASPIRATION VIZISHOT 19G (NEEDLE) IMPLANT
NEEDLE ASPIRATION VIZISHOT 21G (NEEDLE) ×6 IMPLANT
NS IRRIG 1000ML POUR BTL (IV SOLUTION) ×3 IMPLANT
OIL SILICONE PENTAX (PARTS (SERVICE/REPAIRS)) ×3 IMPLANT
PAD ARMBOARD 7.5X6 YLW CONV (MISCELLANEOUS) ×6 IMPLANT
STOPCOCK 4 WAY LG BORE MALE ST (IV SETS) ×2 IMPLANT
SYR 20ML ECCENTRIC (SYRINGE) ×6 IMPLANT
SYR 20ML LL LF (SYRINGE) ×6 IMPLANT
SYR 30ML LL (SYRINGE) ×3 IMPLANT
SYR 5ML LUER SLIP (SYRINGE) ×3 IMPLANT
TOWEL GREEN STERILE FF (TOWEL DISPOSABLE) ×3 IMPLANT
TRAP SPECIMEN MUCOUS 40CC (MISCELLANEOUS) IMPLANT
TUBE CONNECTING 20'X1/4 (TUBING) ×2
TUBE CONNECTING 20X1/4 (TUBING) ×4 IMPLANT
TUBING CIL FLEX 10 FLL-RA (TUBING) ×2 IMPLANT
TUBING EXTENTION W/L.L. (IV SETS) ×2 IMPLANT
UNDERPAD 30X30 (UNDERPADS AND DIAPERS) ×3 IMPLANT
VALVE BIOPSY  SINGLE USE (MISCELLANEOUS) ×4
VALVE BIOPSY SINGLE USE (MISCELLANEOUS) ×1 IMPLANT
VALVE SUCTION BRONCHIO DISP (MISCELLANEOUS) ×3 IMPLANT
WATER STERILE IRR 1000ML POUR (IV SOLUTION) ×3 IMPLANT

## 2018-12-30 NOTE — Interval H&P Note (Signed)
History and Physical Interval Note:  12/30/2018 9:45 AM  Martin Bailey  has presented today for surgery, with the diagnosis of RIGHT LOWER LOBE LUNG MASS.  The various methods of treatment have been discussed with the patient and family. After consideration of risks, benefits and other options for treatment, the patient has consented to  Procedure(s): Herman (Left) as a surgical intervention.  The patient's history has been reviewed, patient examined, no change in status, stable for surgery.  I have reviewed the patient's chart and labs.  Questions were answered to the patient's satisfaction.    Questions about procedure answered Agrees to proceed with procedure Othman Masur A Xavier Munger

## 2018-12-30 NOTE — Discharge Instructions (Signed)
Discharge home today  Activity as tolerated  Call with fever greater than 101  Will inform you of results as they become available

## 2018-12-30 NOTE — Anesthesia Procedure Notes (Signed)
Procedure Name: Intubation Date/Time: 12/30/2018 12:46 PM Performed by: Myna Bright, CRNA Pre-anesthesia Checklist: Patient identified, Emergency Drugs available, Suction available and Patient being monitored Patient Re-evaluated:Patient Re-evaluated prior to induction Oxygen Delivery Method: Circle system utilized Preoxygenation: Pre-oxygenation with 100% oxygen Induction Type: IV induction Ventilation: Mask ventilation without difficulty Laryngoscope Size: Mac and 4 Grade View: Grade I Tube type: Oral Tube size: 8.5 mm Number of attempts: 1 Airway Equipment and Method: Stylet Placement Confirmation: ETT inserted through vocal cords under direct vision,  positive ETCO2 and breath sounds checked- equal and bilateral Secured at: 22 cm Tube secured with: Tape Dental Injury: Teeth and Oropharynx as per pre-operative assessment

## 2018-12-30 NOTE — Op Note (Signed)
Name:  [name] MRN:  [id number] DOB:  07-09-1940  PROCEDURE NOTE  Procedure(s): Flexible bronchoscopy [phone]) Brushing [phone]) of the RLL Bronchial alveolar lavage (66599) of the RLL Transbronchial lung biopsy, single lobe (35701) of the RLL mass Endobronchial ultrasound (77939) Transbronchial needle aspiration (03009) of the 4R Transbronchial needle aspiration, additional lobe (23300) of the 7  Indications:  Hilar / mediastinal lymphadenopathy.  Consent:  Procedure, benefits, risks and alternatives discussed.  Questions answered.  Consent obtained.  Anesthesia:  General endotracheal.  Procedure summary:  Appropriate equipment was assembled.  The patient was brought to the operating room and identified as Martin Bailey.  Safety timeout was performed. The patient was placed supine on the operating table, airway established and general anesthesia administered by Anesthesia team.   After the appropriate level of anesthesia was assured, flexible video bronchoscope was lubricated and inserted through the endotracheal tube.   Airway examination was performed bilaterally to subsegmental level.  Minimal clear secretions were noted, mucosa appeared normal and no endobronchial lesions were identified.  Endobronchial ultrasound video bronchoscope was then lubricated and inserted through the endotracheal tube. Surveillance of the mediastinal and and bilateral hilar lymph node stations was performed.  Pathologically enlarged lymph nodes were noted.  4R node noted to be enlarged Subcarinal node noted to be enlarged   Endobronchial ultrasound guided transbronchial needle aspiration of 4R (passes 3 with aspiration of the nodes), subcarina node (passes 4 passes with aspiration of the node)  was performed, after which EBUS bronchoscope was withdrawn.  Flexible video bronchoscope was used again to perform random endobronchial examination.  Bronchoscope was wedged in the right lower lobe posterior  lateral segment where brushings were performed x3, biopsies were performed x6 after hemostasis was assured, bronchial alveolar lavage of the right lower lobe was performed with 12m of normal saline and return of 15 mL of fluid, after which the bronchoscope was withdrawn. The patient was extubated in operating room and transferred to PACU. Post-procedure chest x-ray was ordered.  Specimens sent: Bronchial alveolar lavage specimen of the right lower lobe for cytology and microbiology Lymph node aspiration from subcarina and 4R node Biopsy of right lower lobe mass Brushings of right lower lobe mass  Complications:  No immediate complications were noted.  Hemodynamic parameters and oxygenation remained stable throughout the procedure.  Estimated blood loss:  Less then 144m  AdSherrilyn Ristulmonary and CrRockwoodell: [phone]/04/2018, 1:59 PM

## 2018-12-30 NOTE — Anesthesia Preprocedure Evaluation (Addendum)
Anesthesia Evaluation  Patient identified by MRN, date of birth, ID band Patient awake    Reviewed: Allergy & Precautions, NPO status , Patient's Chart, lab work & pertinent test results, reviewed documented beta blocker date and time   History of Anesthesia Complications Negative for: history of anesthetic complications  Airway Mallampati: I  TM Distance: >3 FB Neck ROM: Full    Dental  (+) Dental Advisory Given   Pulmonary former smoker (quit 1979),  12/27/2018 SARS coronavirus NEG   breath sounds clear to auscultation       Cardiovascular hypertension, Pt. on medications and Pt. on home beta blockers (-) angina+ dysrhythmias Atrial Fibrillation  Rhythm:Irregular Rate:Normal     Neuro/Psych negative neurological ROS     GI/Hepatic negative GI ROS, Neg liver ROS,   Endo/Other  Morbid obesity  Renal/GU negative Renal ROS     Musculoskeletal   Abdominal   Peds  Hematology Xarelto: last dose saturday   Anesthesia Other Findings   Reproductive/Obstetrics                            Anesthesia Physical Anesthesia Plan  ASA: III  Anesthesia Plan: General   Post-op Pain Management:    Induction: Intravenous  PONV Risk Score and Plan: 2 and Ondansetron, Dexamethasone and Treatment may vary due to age or medical condition  Airway Management Planned: Oral ETT  Additional Equipment:   Intra-op Plan:   Post-operative Plan: Extubation in OR  Informed Consent: I have reviewed the patients History and Physical, chart, labs and discussed the procedure including the risks, benefits and alternatives for the proposed anesthesia with the patient or authorized representative who has indicated his/her understanding and acceptance.     Dental advisory given  Plan Discussed with: CRNA and Surgeon  Anesthesia Plan Comments:        Anesthesia Quick Evaluation

## 2018-12-30 NOTE — Anesthesia Postprocedure Evaluation (Signed)
Anesthesia Post Note  Patient: Marvetta Gibbons  Procedure(s) Performed: VIDEO BRONCHOSCOPY WITH ENDOBRONCHIAL ULTRASOUND WITH FLUOROSCOPY (Left )     Patient location during evaluation: PACU Anesthesia Type: General Level of consciousness: awake and alert, patient cooperative and oriented Pain management: pain level controlled Vital Signs Assessment: post-procedure vital signs reviewed and stable Respiratory status: spontaneous breathing, nonlabored ventilation and respiratory function stable Cardiovascular status: blood pressure returned to baseline and stable Postop Assessment: no apparent nausea or vomiting Anesthetic complications: no    Last Vitals:  Vitals:   12/30/18 1504 12/30/18 1519  BP: 100/69 106/64  Pulse: 77 80  Resp: (!) 26 10  Temp:    SpO2: 97% 98%    Last Pain:  Vitals:   12/30/18 1515  PainSc: 0-No pain                 Andrik Sandt,E. Kacey Vicuna

## 2018-12-30 NOTE — Pre-Procedure Instructions (Signed)
Stable for procedure

## 2018-12-30 NOTE — Transfer of Care (Signed)
Immediate Anesthesia Transfer of Care Note  Patient: Martin Bailey  Procedure(s) Performed: VIDEO BRONCHOSCOPY WITH ENDOBRONCHIAL ULTRASOUND WITH FLUOROSCOPY (Left )  Patient Location: PACU  Anesthesia Type:General  Level of Consciousness: awake, alert , oriented and patient cooperative  Airway & Oxygen Therapy: Patient Spontanous Breathing and Patient connected to face mask oxygen  Post-op Assessment: Report given to RN, Post -op Vital signs reviewed and stable and Patient moving all extremities  Post vital signs: Reviewed and stable  Last Vitals:  Vitals Value Taken Time  BP 96/52 12/30/18 1405  Temp    Pulse 90 12/30/18 1410  Resp 31 12/30/18 1410  SpO2 100 % 12/30/18 1410  Vitals shown include unvalidated device data.  Last Pain:  Vitals:   12/30/18 0828  PainSc: 0-No pain      Patients Stated Pain Goal: 2 (96/92/49 3241)  Complications: No apparent anesthesia complications

## 2018-12-31 ENCOUNTER — Encounter (HOSPITAL_COMMUNITY): Payer: Self-pay | Admitting: Pulmonary Disease

## 2019-01-02 ENCOUNTER — Ambulatory Visit: Payer: Medicare Other | Admitting: Pulmonary Disease

## 2019-01-02 LAB — CULTURE, RESPIRATORY W GRAM STAIN
Culture: NO GROWTH
Gram Stain: NONE SEEN

## 2019-01-02 LAB — CYTOLOGY - NON PAP

## 2019-01-02 LAB — SURGICAL PATHOLOGY

## 2019-01-03 ENCOUNTER — Telehealth: Payer: Self-pay | Admitting: Pulmonary Disease

## 2019-01-03 DIAGNOSIS — C3490 Malignant neoplasm of unspecified part of unspecified bronchus or lung: Secondary | ICD-10-CM

## 2019-01-03 NOTE — Telephone Encounter (Signed)
Spoke with patient this morning about biopsy result showing squamous cell carcinoma  Patient needs referral to oncology to be seen as soon as possible -Referral diagnosis squamous cell lung cancer  Please inform patient about the referral being made-he does like to know ahead of time so he can make arrangements

## 2019-01-03 NOTE — Telephone Encounter (Signed)
Referral placed.  Pt aware that referral is being placed.  Nothing further needed at this time- will close encounter.

## 2019-01-04 ENCOUNTER — Telehealth: Payer: Self-pay | Admitting: Internal Medicine

## 2019-01-04 ENCOUNTER — Encounter: Payer: Self-pay | Admitting: *Deleted

## 2019-01-04 LAB — ANAEROBIC CULTURE

## 2019-01-04 NOTE — Telephone Encounter (Signed)
Pt returned my call and confirmed appt

## 2019-01-04 NOTE — Progress Notes (Signed)
Oncology Nurse Navigator Documentation  Oncology Nurse Navigator Flowsheets 01/04/2019  Navigator Location [facility]  Referral Date to RadOnc/MedOnc 01/03/2019  Navigator Encounter Type Other/I received referral yesterday on Martin Bailey.  I updated new patient coordinator to call and schedule him to be seen on 10/9 with Dr. Julien Nordmann.   Treatment Phase Pre-Tx/Tx Discussion  Barriers/Navigation Needs Coordination of Care  Interventions Coordination of Care  Coordination of Care Other  Acuity Level 2-Minimal Needs (1-2 Barriers Identified)  Time Spent with Patient 30

## 2019-01-04 NOTE — Telephone Encounter (Signed)
Received a new patient referral from [facility] for Squamous cell carcinoma of lung. An appt has been scheduled for Martin Bailey to see Dr. Julien Nordmann on 10/9 at 10:30am w/labs at 10am. I left the appt date and time on the pt's vm. I asked that he return my call to confirm the appt.

## 2019-01-05 ENCOUNTER — Telehealth: Payer: Self-pay | Admitting: *Deleted

## 2019-01-05 ENCOUNTER — Other Ambulatory Visit: Payer: Self-pay | Admitting: *Deleted

## 2019-01-05 DIAGNOSIS — R911 Solitary pulmonary nodule: Secondary | ICD-10-CM

## 2019-01-05 NOTE — Progress Notes (Signed)
The proposed treatment discussed in cancer conference 01/05/19 is for discussion purpose only and not a binding recommendation.  The patient was not physically seen nor present for their treatment options.  Therefore, final treatment plans cannot be decided.

## 2019-01-05 NOTE — Telephone Encounter (Signed)
Oncology Nurse Navigator Documentation  Oncology Nurse Navigator Flowsheets 01/05/2019  Navigator Location [facility]  Referral Date to RadOnc/MedOnc -  Navigator Encounter Type Telephone/I called to update Mr. Murguia on his appt with Dr. Julien Nordmann tomorrow.  I was unable to reach but did leave a vm message with appt, my name, and phone number to call if needed.   Telephone Outgoing Call  Treatment Phase Pre-Tx/Tx Discussion  Barriers/Navigation Needs Education  Education Other  Interventions Education  Coordination of Care -  Education Method Verbal  Acuity Level 2-Minimal Needs (1-2 Barriers Identified)  Time Spent with Patient 15

## 2019-01-06 ENCOUNTER — Inpatient Hospital Stay: Payer: Medicare Other | Attending: Internal Medicine | Admitting: Internal Medicine

## 2019-01-06 ENCOUNTER — Inpatient Hospital Stay: Payer: Medicare Other

## 2019-01-06 ENCOUNTER — Telehealth: Payer: Self-pay | Admitting: Internal Medicine

## 2019-01-06 ENCOUNTER — Other Ambulatory Visit: Payer: Self-pay

## 2019-01-06 ENCOUNTER — Encounter: Payer: Self-pay | Admitting: Internal Medicine

## 2019-01-06 VITALS — BP 90/55 | HR 83 | Temp 98.0°F | Resp 18 | Ht 70.0 in | Wt 192.7 lb

## 2019-01-06 DIAGNOSIS — Z801 Family history of malignant neoplasm of trachea, bronchus and lung: Secondary | ICD-10-CM | POA: Insufficient documentation

## 2019-01-06 DIAGNOSIS — I4891 Unspecified atrial fibrillation: Secondary | ICD-10-CM | POA: Diagnosis not present

## 2019-01-06 DIAGNOSIS — C3491 Malignant neoplasm of unspecified part of right bronchus or lung: Secondary | ICD-10-CM

## 2019-01-06 DIAGNOSIS — E785 Hyperlipidemia, unspecified: Secondary | ICD-10-CM | POA: Insufficient documentation

## 2019-01-06 DIAGNOSIS — I10 Essential (primary) hypertension: Secondary | ICD-10-CM | POA: Insufficient documentation

## 2019-01-06 DIAGNOSIS — Z5111 Encounter for antineoplastic chemotherapy: Secondary | ICD-10-CM | POA: Insufficient documentation

## 2019-01-06 DIAGNOSIS — Z87891 Personal history of nicotine dependence: Secondary | ICD-10-CM | POA: Insufficient documentation

## 2019-01-06 DIAGNOSIS — Z7189 Other specified counseling: Secondary | ICD-10-CM | POA: Insufficient documentation

## 2019-01-06 DIAGNOSIS — C3431 Malignant neoplasm of lower lobe, right bronchus or lung: Secondary | ICD-10-CM | POA: Insufficient documentation

## 2019-01-06 DIAGNOSIS — Z23 Encounter for immunization: Secondary | ICD-10-CM | POA: Diagnosis not present

## 2019-01-06 DIAGNOSIS — Z79899 Other long term (current) drug therapy: Secondary | ICD-10-CM | POA: Insufficient documentation

## 2019-01-06 DIAGNOSIS — Z7901 Long term (current) use of anticoagulants: Secondary | ICD-10-CM | POA: Diagnosis not present

## 2019-01-06 DIAGNOSIS — R911 Solitary pulmonary nodule: Secondary | ICD-10-CM

## 2019-01-06 LAB — CMP (CANCER CENTER ONLY)
ALT: 15 U/L (ref 0–44)
AST: 16 U/L (ref 15–41)
Albumin: 3.2 g/dL — ABNORMAL LOW (ref 3.5–5.0)
Alkaline Phosphatase: 80 U/L (ref 38–126)
Anion gap: 9 (ref 5–15)
BUN: 17 mg/dL (ref 8–23)
CO2: 23 mmol/L (ref 22–32)
Calcium: 8.9 mg/dL (ref 8.9–10.3)
Chloride: 104 mmol/L (ref 98–111)
Creatinine: 0.98 mg/dL (ref 0.61–1.24)
GFR, Est AFR Am: >60 mL/min (ref >60)
GFR, Estimated: >60 mL/min (ref >60)
Glucose, Bld: 143 mg/dL — ABNORMAL HIGH (ref 70–99)
Potassium: 4.2 mmol/L (ref 3.5–5.1)
Sodium: 136 mmol/L (ref 135–145)
Total Bilirubin: 0.7 mg/dL (ref 0.3–1.2)
Total Protein: 7.2 g/dL (ref 6.5–8.1)

## 2019-01-06 LAB — CBC WITH DIFFERENTIAL (CANCER CENTER ONLY)
Abs Immature Granulocytes: 0.04 10*3/uL (ref 0.00–0.07)
Basophils Absolute: 0.1 10*3/uL (ref 0.0–0.1)
Basophils Relative: 1 %
Eosinophils Absolute: 0.1 10*3/uL (ref 0.0–0.5)
Eosinophils Relative: 1 %
HCT: 39.8 % (ref 39.0–52.0)
Hemoglobin: 13 g/dL (ref 13.0–17.0)
Immature Granulocytes: 0 %
Lymphocytes Relative: 17 %
Lymphs Abs: 1.7 10*3/uL (ref 0.7–4.0)
MCH: 29.2 pg (ref 26.0–34.0)
MCHC: 32.7 g/dL (ref 30.0–36.0)
MCV: 89.4 fL (ref 80.0–100.0)
Monocytes Absolute: 1 10*3/uL (ref 0.1–1.0)
Monocytes Relative: 9 %
Neutro Abs: 7.6 10*3/uL (ref 1.7–7.7)
Neutrophils Relative %: 72 %
Platelet Count: 228 10*3/uL (ref 150–400)
RBC: 4.45 MIL/uL (ref 4.22–5.81)
RDW: 13.7 % (ref 11.5–15.5)
WBC Count: 10.5 10*3/uL (ref 4.0–10.5)
nRBC: 0 % (ref 0.0–0.2)

## 2019-01-06 MED ORDER — PROCHLORPERAZINE MALEATE 10 MG PO TABS: 10.0000 mg | ORAL_TABLET | Freq: Four times a day (QID) | ORAL | 2 refills | Status: DC | PRN

## 2019-01-06 NOTE — Patient Instructions (Signed)
-  There are two main categories of lung cancer, they are named based on the size of the cancer cell. One is called Non-Small cell lung cancer. The other type is Small Cell Lung Cancer -The sample (biopsy) that they took of your tumor was consistent with a subtype of Non-small cell lung cancer called Squamous Cell Carcinoma -We covered a lot of important information at your appointment today regarding what the treatment plan is moving forward. Here are the the main points that were discussed at your office visit with [place] today:  -The treatment that you will receive consists of two chemotherapy drugs called Carboplatin and Paclitaxel (also called Taxol) -We are planning on starting your treatment next week on 01/16/2019 but before your start your treatment, I would like you to attend a Chemotherapy Education Class. This involves having you sit down with one of our nurse educators. She will discuss with you one-on-one more details about your treatment as well as general information about resources here at the [facility] treatment will be given every week for about 6 weeks or so (as long as you are also receiving radiation). We will check your labs (blood work) once a week . Dr. Julien Nordmann or Rubin Payor will see you every other treatment just to make sure that you are doing well and that everything is on track. -We will get a CT scan about 3 weeks after you complete your treatment  Medications:  -Compazine was sent to your pharmacy. This medication is for nausea. You may take this every 6 hours as needed if you feel nausous.   Referrals Radiation oncology -MRI brain  Follow Up:  -We will see you back for a follow up visit in 3 weeks when you start your second dose of your treatment

## 2019-01-06 NOTE — Progress Notes (Signed)
[place] [phone]   [phone]  CONSULT NOTE  REFERRING PHYSICIAN: Laurin Coder, MD  REASON FOR CONSULTATION:  78 years old white male recently diagnosed with lung cancer.  HPI Martin Bailey is a 78 y.o. male with past medical history significant for hypertension, dyslipidemia, atrial fibrillation and a remote history for smoking for around 10 years but quit 41 years ago.  The patient mentions that he has been complaining of chronic cough for more than a year.  He was evaluated by ear nose and throat and treated for acid reflux.  There was no improvement in his cough and he was seen by his primary care physician Dr. Yong Bailey.  Chest x-ray on 08/30/2018 showed right lower lobe airspace consolidation felt to represent pneumonia.  He was treated for pneumonia but no improvement in his condition.  Repeat chest x-ray on 11/16/2018 showed the right lower lobe opacity.  This was followed by CT scan of the chest without contrast on 11/21/2018 and that showed a spiculated mass of the right lower lobe measuring 5.4 cm concerning for primary lung malignancy.  The patient also had a PET scan on 12/02/2018 and that showed 5.4 cm right lower lobe mass with maximum SUV of 16.3 with mild associated postobstructive atelectasis and pneumonitis.  There was subcarinal lymph node measuring 1.6 cm in short axis with low-grade metabolic activity with maximum SUV of 4.3.  This may well represent early metastatic disease.  There was no distant metastatic disease outside the chest identified.  The patient was seen by Dr. Ander Bailey and on December 30, 2018 he underwent flexible bronchoscopy with brushing of the right lower lobe as well as bronchoalveolar lavage and transbronchial biopsy of the right lower lobe mass in addition to trans-bronchial needle aspiration of the 4R and 7 lymph nodes. The final pathology ([id number]) showed scant fragments of squamous cell carcinoma.  The  fine-needle aspiration of the right lower lobe brushing as well as lymph node showed malignant cells consistent with squamous cell carcinoma.  The patient was referred to me today for evaluation and recommendation regarding treatment of his condition. When seen today he is feeling fine except for the cough productive of clear mucus.  He denied having any chest pain, shortness of breath or hemoptysis.  He denied having any fever or chills.  He has no nausea, vomiting, diarrhea or constipation.  He denied having any headache or visual changes. Family history significant for mother with dementia and father had lung cancer in his 48s. The patient is widowed and has 1 daughter, Martin Bailey who lives in [place].  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  The patient has a history for smoking for around 10 years but quit 41 years ago.  He drinks 3-4 alcoholic drinks every day.  He has no history of drug abuse.  HPI  Past Medical History:  Diagnosis Date  . Cataract    left  . COLONIC POLYPS, HX OF 04/05/2007   ONE ADENOMATOUS POLYP AND ONE HYPERPLASTIC POLYP  . DEPRESSION 03/04/2007  . DERMATITIS, SEBORRHEIC 07/19/2007  . FIBRILLATION, ATRIAL 03/04/2007  . Hearing aid worn    B/L  . HYPERLIPIDEMIA 03/04/2007  . HYPERTENSION 03/04/2007  . Kidney stones   . Lung mass    mediastinal adenopathy    Past Surgical History:  Procedure Laterality Date  . CATARACT EXTRACTION W/ INTRAOCULAR LENS IMPLANT  right  . COLONOSCOPY    . RADIOFREQUENCY ABLATION     x2 for fib, on 2nd attempt switched form a flutter ot a fib.   Marland Kitchen ROTATOR CUFF REPAIR Right   . TONSILLECTOMY    . VIDEO BRONCHOSCOPY WITH ENDOBRONCHIAL ULTRASOUND Left 12/30/2018   Procedure: VIDEO BRONCHOSCOPY WITH ENDOBRONCHIAL ULTRASOUND WITH FLUOROSCOPY;  Surgeon: Martin Coder, MD;  Location: [facility];  Service: Thoracic;  Laterality: Left;  . WISDOM TOOTH EXTRACTION       Family History  Problem Relation Age of Onset  . Osteoporosis Mother   . Dementia Mother   . Lung cancer Father        former smoker  . Colon cancer Neg Hx   . Stomach cancer Neg Hx   . Esophageal cancer Neg Hx   . Rectal cancer Neg Hx     Social History Social History   Tobacco Use  . Smoking status: Former Smoker    Packs/day: 1.50    Years: 10.00    Pack years: 15.00    Types: Cigarettes    Quit date: 07/08/1977    Years since quitting: 41.5  . Smokeless tobacco: Never Used  Substance Use Topics  . Alcohol use: Yes    Alcohol/week: 42.0 standard drinks    Types: 42 Standard drinks or equivalent per week    Comment: 4 scotch's a day  . Drug use: No    No Known Allergies  Current Outpatient Medications  Medication Sig Dispense Refill  . amLODipine (NORVASC) 5 MG tablet TAKE 1 TABLET BY MOUTH  DAILY (Patient taking differently: Take 5 mg by mouth daily. ) 90 tablet 3  . atenolol (TENORMIN) 50 MG tablet TAKE 1 TABLET BY MOUTH  TWICE DAILY (Patient taking differently: Take 50 mg by mouth 2 (two) times daily. ) 180 tablet 3  . clobetasol (TEMOVATE) 0.05 % external solution Apply 1 application topically 2 (two) times daily. For seborrheic dermatitis- 7 days maximum (Patient taking differently: Apply 1 application topically 2 (two) times daily as needed (For seborrheic dermatitis). 7 days maximum) 50 mL 0  . doxycycline (VIBRA-TABS) 100 MG tablet Take 1 tablet (100 mg total) by mouth 2 (two) times daily. (Patient not taking: Reported on 12/20/2018) 20 tablet 0  . fluticasone (FLONASE) 50 MCG/ACT nasal spray Place 2 sprays into both nostrils daily. (Patient not taking: Reported on 12/20/2018) 16 g 3  . moxifloxacin (VIGAMOX) 0.5 % ophthalmic solution Place 1 drop into the right eye 4 (four) times daily.     . Multiple Vitamin (MULTIVITAMIN WITH MINERALS) TABS tablet Take 1 tablet by mouth daily.    . prednisoLONE acetate (PRED FORTE) 1 % ophthalmic suspension Place 1 drop into  the right eye 4 (four) times daily.     Marland Kitchen triamcinolone cream (KENALOG) 0.5 % Apply topically 2 (two) times daily as needed. (Patient taking differently: Apply 1 application topically 2 (two) times daily as needed (skin irritation/rash). ) 30 g 1  . XARELTO 20 MG TABS tablet TAKE 1 TABLET BY MOUTH  DAILY WITH SUPPER (Patient taking differently: Take 20 mg by mouth daily. ) 90 tablet 3   No current facility-administered medications for this visit.     Review of Systems  Constitutional: positive for fatigue Eyes: negative Ears, nose, mouth, throat, and face: negative Respiratory: positive for cough Cardiovascular: negative Gastrointestinal: negative Genitourinary:negative Integument/breast: negative Hematologic/lymphatic: negative Musculoskeletal:negative Neurological: negative Behavioral/Psych: negative Endocrine: negative Allergic/Immunologic: negative  Physical Exam  KDX:IPJAS, healthy, no distress, well  nourished, well developed and anxious SKIN: skin color, texture, turgor are normal, no rashes or significant lesions HEAD: Normocephalic, No masses, lesions, tenderness or abnormalities EYES: normal, PERRLA, Conjunctiva are pink and non-injected EARS: External ears normal, Canals clear OROPHARYNX:no exudate, no erythema and lips, buccal mucosa, and tongue normal  NECK: supple, no adenopathy, no JVD LYMPH:  no palpable lymphadenopathy, no hepatosplenomegaly LUNGS: clear to auscultation , and palpation HEART: regular rate & rhythm, no murmurs and no gallops ABDOMEN:abdomen soft, non-tender, normal bowel sounds and no masses or organomegaly BACK: Back symmetric, no curvature., No CVA tenderness EXTREMITIES:no joint deformities, effusion, or inflammation, no edema  NEURO: alert & oriented x 3 with fluent speech, no focal motor/sensory deficits  PERFORMANCE STATUS: ECOG 1  LABORATORY DATA: Lab Results  Component Value Date   WBC 10.5 01/06/2019   HGB 13.0 01/06/2019   HCT  39.8 01/06/2019   MCV 89.4 01/06/2019   PLT 228 01/06/2019      Chemistry      Component Value Date/Time   NA 134 (L) 12/30/2018 0844   K 4.8 12/30/2018 0844   CL 103 12/30/2018 0844   CO2 22 12/30/2018 0844   BUN 14 12/30/2018 0844   CREATININE 0.95 12/30/2018 0844      Component Value Date/Time   CALCIUM 8.4 (L) 12/30/2018 0844   ALKPHOS 69 12/30/2018 0844   AST 33 12/30/2018 0844   ALT 11 12/30/2018 0844   BILITOT 1.3 (H) 12/30/2018 0844       RADIOGRAPHIC STUDIES: Dg Chest Port 1 View  Result Date: 12/30/2018 CLINICAL DATA:  Post bronchoscopy EXAM: PORTABLE CHEST 1 VIEW COMPARISON:  11/16/2018 FINDINGS: Right lower lobe opacity again noted. No pneumothorax following bronchoscopy. Heart is normal size. No effusions. IMPRESSION: No pneumothorax following bronchoscopy. Electronically Signed   By: Rolm Baptise M.D.   On: 12/30/2018 15:27   Dg C-arm Bronchoscopy  Result Date: 12/30/2018 C-ARM BRONCHOSCOPY: Fluoroscopy was utilized by the requesting physician.  No radiographic interpretation.    ASSESSMENT: This is a very pleasant 78 years old white male with stage IIIb (T3, N2, M0) non-small cell lung cancer, squamous cell carcinoma presented with right lower lobe lung mass in addition to subcarinal lymphadenopathy diagnosed in October 2020.  The patient has remote history of smoking and quit 41 years ago.   PLAN: I had a lengthy discussion with the patient today about his current disease stage, prognosis and treatment options. I personally and independently reviewed the scan images and discussed the result and showed the images to the patient today. I recommended for the patient to complete the staging work-up by ordering MRI of the brain to rule out brain metastasis. I also recommended for the patient to have a blood sample sent to [facility] for molecular studies since he quit smoking more than 41 years ago and the diagnosis of his squamous cell carcinoma is based on a  small needle biopsy and there may be potential mixed tumor.  I discussed with the patient his treatment options and recommended for him a course of concurrent chemoradiation with weekly carboplatin for AUC of 2 and paclitaxel 45 mg/M2 for 6-7 weeks.  This will be followed by evaluation for surgical resection versus consolidation treatment with immunotherapy with Imfinzi if the patient has no evidence for disease progression. I discussed with the patient the adverse effect of this treatment including but not limited to alopecia, myelosuppression, nausea and vomiting, peripheral neuropathy, liver or renal dysfunction. The patient would like to proceed  with a treatment as planned. I will refer him to radiation oncology for evaluation and discussion of the radiotherapy option. He is expected to start the first cycle of this treatment on January 16, 2019. The patient will come back for follow-up visit in 1 week after the first dose of his treatment for management of any adverse effect of his treatment. I will arrange for the patient to have a chemotherapy education class before the first dose of his treatment. I will also send a prescription for Compazine 10 mg p.o. every 6 hours as needed for nausea to his pharmacy. The patient was advised to call immediately if he has any concerning symptoms in the interval. The patient voices understanding of current disease status and treatment options and is in agreement with the current care plan.  All questions were answered. The patient knows to call the clinic with any problems, questions or concerns. We can certainly see the patient much sooner if necessary.  Thank you so much for allowing me to participate in the care of Martin Bailey. I will continue to follow up the patient with you and assist in his care.  I spent 60 minutes counseling the patient face to face. The total time spent in the appointment was 80 minutes.  Disclaimer: This note was dictated with  voice recognition software. Similar sounding words can inadvertently be transcribed and may not be corrected upon review.   Eilleen Kempf January 06, 2019, 10:38 AM

## 2019-01-06 NOTE — Progress Notes (Signed)
START ON PATHWAY REGIMEN - Non-Small Cell Lung     Administer weekly:     Paclitaxel      Carboplatin   **Always confirm dose/schedule in your pharmacy ordering system**  Patient Characteristics: Stage III - Unresectable, PS = 0, 1 AJCC T Category: T3 Current Disease Status: No Distant Mets or Local Recurrence AJCC N Category: N2 AJCC M Category: M0 AJCC 8 Stage Grouping: IIIB ECOG Performance Status: 1 Intent of Therapy: Curative Intent, Discussed with Patient 

## 2019-01-06 NOTE — Telephone Encounter (Signed)
Called patient regarding upcoming appointments, left a voicemail. Patient will receive calender in the mail.

## 2019-01-09 ENCOUNTER — Encounter: Payer: Self-pay | Admitting: Radiation Oncology

## 2019-01-09 NOTE — Progress Notes (Signed)
Thoracic Location of Tumor / Histology: stage IIIb (T3, N2, M0) non-small cell lung cancer, squamous cell carcinoma presented with right lower lobe lung mass in addition to subcarinal lymphadenopathy diagnosed in October 2020  The patient mentions that he has been complaining of chronic cough for more than a year.  He was evaluated by ear nose and throat and treated for acid reflux.  There was no improvement in his cough and he was seen by his primary care physician [name].  Chest x-ray on 08/30/2018 showed right lower lobe airspace consolidation felt to represent pneumonia.  He was treated for pneumonia but no improvement in his condition.  Repeat chest x-ray on 11/16/2018 showed the right lower lobe opacity.  This was followed by CT scan of the chest without contrast on 11/21/2018 and that showed a spiculated mass of the right lower lobe measuring 5.4 cm concerning for primary lung malignancy.    Tobacco/Marijuana/Snuff/ETOH use: remote history of smoking and quit 41 years ago.  Past/Anticipated interventions by cardiothoracic surgery, if any: no  Past/Anticipated interventions by medical oncology, if any: plan to start concurrent chemoradiation on Monday, October 19 with paclitaxel and carboplatin followed by consideration for surgical resection vs immunotherapy  Signs/Symptoms  Weight changes, if any: denies  Respiratory complaints, if any: Chronic productive cough with clear sputum. Denies SOB.  Hemoptysis, if any: no  Pain issues, if any:  Denies chest pain.  SAFETY ISSUES:  Prior radiation? Denies   Pacemaker/ICD? denies   Possible current pregnancy?no, male patient  Is the patient on methotrexate? denies  Current Complaints / other details:  78 year old male. Widowed. One daughter , [name], who resides in [place].   Aaron Edelman MRI has been ordered by [facility] but not scheduled yet. Patient's father with history of lung ca.

## 2019-01-10 ENCOUNTER — Encounter: Payer: Self-pay | Admitting: Internal Medicine

## 2019-01-10 ENCOUNTER — Ambulatory Visit
Admission: RE | Admit: 2019-01-10 | Discharge: 2019-01-10 | Disposition: A | Discharge status: Home / Self Care | Payer: Medicare Other | Source: Ambulatory Visit | Attending: Radiation Oncology | Admitting: Radiation Oncology

## 2019-01-10 ENCOUNTER — Encounter: Payer: Self-pay | Admitting: Radiation Oncology

## 2019-01-10 ENCOUNTER — Other Ambulatory Visit: Payer: Self-pay

## 2019-01-10 VITALS — Ht 70.0 in | Wt 189.0 lb

## 2019-01-10 DIAGNOSIS — C3491 Malignant neoplasm of unspecified part of right bronchus or lung: Secondary | ICD-10-CM

## 2019-01-10 DIAGNOSIS — C3431 Malignant neoplasm of lower lobe, right bronchus or lung: Secondary | ICD-10-CM | POA: Diagnosis not present

## 2019-01-10 DIAGNOSIS — R590 Localized enlarged lymph nodes: Secondary | ICD-10-CM | POA: Diagnosis not present

## 2019-01-10 DIAGNOSIS — Z801 Family history of malignant neoplasm of trachea, bronchus and lung: Secondary | ICD-10-CM | POA: Diagnosis not present

## 2019-01-10 DIAGNOSIS — Z87891 Personal history of nicotine dependence: Secondary | ICD-10-CM | POA: Diagnosis not present

## 2019-01-10 DIAGNOSIS — Z79899 Other long term (current) drug therapy: Secondary | ICD-10-CM | POA: Diagnosis not present

## 2019-01-10 HISTORY — DX: Malignant neoplasm of unspecified part of unspecified bronchus or lung: C34.90

## 2019-01-10 NOTE — Progress Notes (Signed)
Radiation Oncology         (336) [phone] ________________________________  Initial outpatient Consultation - Conducted via MyChart due to current COVID-19 concerns for limiting patient exposure  Name: Martin Bailey MRN: [id number]  Date: 01/10/2019  DOB: 14-May-1940  NG:EXBMWU, Martin Mars, MD  Heilingoetter, Cassandr*   REFERRING PHYSICIAN: Curt Bears, MD  DIAGNOSIS: 78 y.o. gentleman with stage IIIb (T3, N2, M0) non-small cell lung cancer, squamous cell carcinoma presented with right lower lobe lung mass in addition to subcarinal lymphadenopathy diagnosed in October 2020    ICD-10-CM   1. Squamous cell carcinoma of bronchus in right lower lobe (HCC)  C34.31     HISTORY OF PRESENT ILLNESS: Martin Bailey is a 78 y.o. male with a new diagnosis of stage IIIB NSCLC. He initially presented to his PCP with complaints of a chronic cough for more than a year. He was previously evaluated by ENT and treated for acid reflux but when his cough did not improve, he was seen by his PCP. A Chest x-ray performed on 08/30/2018 showed a right lower lobe airspace consolidation felt to represent pneumonia. He completed a course of antibiotics but again had no improvement. A repeat chest x-ray was performed on 11/16/2018 and showed a persistent right lower lobe opacity. This was further evaluated with a chest CT on 11/21/2018, which revealed a spiculated mass of the right lower lobe measuring 5.4 cm with prominent, although not pathologically enlarged, mediastinal lymph nodes. He was referred to Dr. Ander Slade in pulmonology on 11/30/2018, who recommended CT-guided biopsy and PET scan.  He proceeded to PET scan on 12/02/2018, which confirmed a hypermetabolic 5.7 cm right lower lobe mass with mild increased uptake in a subcarinal (level 7) lymph node measuring 1.6 cm, and mild associated postobstructive atelectasis and pneumonitis.  There was no evidence of distant metastatic disease outside of the chest.  He then underwent  bronchoscopy on 12/30/2018 with final pathology confirming malignant cells consistent with NSCLC, squamous cell carcinoma, within the RLL brushing sample and cytology from the sampled node.  He met with Dr. Julien Nordmann on 01/06/2019, who recommended brain MRI, molecular studies by [facility], and treatment with concurrent chemoradiation with weekly paclitaxel and carboplatin. Dr. Julien Nordmann plans to begin the patient on his first cycle of chemotherapy on 01/16/2019.  The patient has kindly been referred today for discussion of potential radiation treatment options.   PREVIOUS RADIATION THERAPY: No  PAST MEDICAL HISTORY:  Past Medical History:  Diagnosis Date   Cataract    left   COLONIC POLYPS, HX OF 04/05/2007   ONE ADENOMATOUS POLYP AND ONE HYPERPLASTIC POLYP   DEPRESSION 03/04/2007   DERMATITIS, SEBORRHEIC 07/19/2007   FIBRILLATION, ATRIAL 03/04/2007   Hearing aid worn    B/L   HYPERLIPIDEMIA 03/04/2007   HYPERTENSION 03/04/2007   Kidney stones    Lung cancer ([place])    non small cell lung ca   Lung mass    mediastinal adenopathy      PAST SURGICAL HISTORY: Past Surgical History:  Procedure Laterality Date   CATARACT EXTRACTION W/ INTRAOCULAR LENS IMPLANT     right   COLONOSCOPY     RADIOFREQUENCY ABLATION     x2 for fib, on 2nd attempt switched form a flutter ot a fib.    ROTATOR CUFF REPAIR Right    TONSILLECTOMY     VIDEO BRONCHOSCOPY WITH ENDOBRONCHIAL ULTRASOUND Left 12/30/2018   Procedure: VIDEO BRONCHOSCOPY WITH ENDOBRONCHIAL ULTRASOUND WITH FLUOROSCOPY;  Surgeon: Laurin Coder, MD;  Location: [facility] OR;  Service: Thoracic;  Laterality: Left;   WISDOM TOOTH EXTRACTION      FAMILY HISTORY:  Family History  Problem Relation Age of Onset   Osteoporosis Mother    Dementia Mother    Lung cancer Father        former smoker   Colon cancer Neg Hx    Stomach cancer Neg Hx    Esophageal cancer Neg Hx    Rectal cancer Neg Hx     SOCIAL HISTORY:    Social History   Socioeconomic History   Marital status: Widowed    Spouse name: Not on file   Number of children: 1   Years of education: Not on file   Highest education level: Not on file  Occupational History   Occupation: retired  Scientist, product/process development strain: Not on file   Food insecurity    Worry: Not on file    Inability: Not on Lexicographer needs    Medical: Not on file    Non-medical: Not on file  Tobacco Use   Smoking status: Former Smoker    Packs/day: 1.50    Years: 10.00    Pack years: 15.00    Types: Cigarettes    Quit date: 07/08/1977    Years since quitting: 41.5   Smokeless tobacco: Never Used  Substance and Sexual Activity   Alcohol use: Yes    Alcohol/week: 42.0 standard drinks    Types: 42 Standard drinks or equivalent per week    Comment: 4 scotch's a day   Drug use: No   Sexual activity: Not on file  Lifestyle   Physical activity    Days per week: Not on file    Minutes per session: Not on file   Stress: Not on file  Relationships   Social connections    Talks on phone: Not on file    Gets together: Not on file    Attends religious service: Not on file    Active member of club or organization: Not on file    Attends meetings of clubs or organizations: Not on file    Relationship status: Not on file   Intimate partner violence    Fear of current or ex partner: Not on file    Emotionally abused: Not on file    Physically abused: Not on file    Forced sexual activity: Not on file  Other Topics Concern   Not on file  Social History Narrative   [place] died 50. 1 daughter. 2 grandkids. 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       Hobbies: golf, bridge, computer, sports, woodworking    ALLERGIES: Patient has no known allergies.  MEDICATIONS:  Current Outpatient  Medications  Medication Sig Dispense Refill   amLODipine (NORVASC) 5 MG tablet TAKE 1 TABLET BY MOUTH  DAILY (Patient taking differently: Take 5 mg by mouth daily. ) 90 tablet 3   atenolol (TENORMIN) 50 MG tablet TAKE 1 TABLET BY MOUTH  TWICE DAILY (Patient taking differently: Take 50 mg by mouth 2 (two) times daily. ) 180 tablet 3   clobetasol (TEMOVATE) 0.05 % external solution Apply 1 application topically 2 (two) times daily. For seborrheic dermatitis- 7 days maximum (Patient taking differently: Apply 1 application topically 2 (two) times daily as needed (For seborrheic dermatitis). 7 days  maximum) 50 mL 0   moxifloxacin (VIGAMOX) 0.5 % ophthalmic solution Place 1 drop into the right eye 4 (four) times daily.      Multiple Vitamin (MULTIVITAMIN WITH MINERALS) TABS tablet Take 1 tablet by mouth daily.     prednisoLONE acetate (PRED FORTE) 1 % ophthalmic suspension Place 1 drop into the right eye 4 (four) times daily.      triamcinolone cream (KENALOG) 0.5 % Apply topically 2 (two) times daily as needed. (Patient taking differently: Apply 1 application topically 2 (two) times daily as needed (skin irritation/rash). ) 30 g 1   XARELTO 20 MG TABS tablet TAKE 1 TABLET BY MOUTH  DAILY WITH SUPPER (Patient taking differently: Take 20 mg by mouth daily. ) 90 tablet 3   prochlorperazine (COMPAZINE) 10 MG tablet Take 1 tablet (10 mg total) by mouth every 6 (six) hours as needed for nausea or vomiting. (Patient not taking: Reported on 01/10/2019) 30 tablet 2   No current facility-administered medications for this encounter.     REVIEW OF SYSTEMS:  On review of systems, the patient reports that he is doing well overall. He denies any fevers, chills, night sweats, or unintended weight changes. He denies any bowel disturbances, and denies abdominal pain, nausea or vomiting. He denies any new musculoskeletal or joint aches or pains. He continues with a chronic productive cough with clear sputum but  denies any shortness of breath, hemoptysis or chest pain. A complete review of systems is obtained and is otherwise negative.  PHYSICAL EXAM:  Wt Readings from Last 3 Encounters:  01/10/19 189 lb (85.7 kg)  01/06/19 192 lb 11.2 oz (87.4 kg)  12/30/18 190 lb (86.2 kg)   Temp Readings from Last 3 Encounters:  01/06/19 98 F (36.7 C) (Temporal)  12/30/18 (!) 97.1 F (36.2 C)  11/30/18 97.6 F (36.4 C) (Temporal)   BP Readings from Last 3 Encounters:  01/06/19 (!) 90/55  12/30/18 106/64  11/30/18 118/68   Pulse Readings from Last 3 Encounters:  01/06/19 83  12/30/18 80  11/30/18 77   Pain Assessment Pain Score: 0-No pain/10  In general this is a well appearing Caucasian gentleman in no acute distress. He's alert and oriented x4 and appropriate throughout the examination. Cardiopulmonary assessment is negative for acute distress and he exhibits normal effort.   KPS = 90  100 - Normal; no complaints; no evidence of disease. 90   - Able to carry on normal activity; minor signs or symptoms of disease. 80   - Normal activity with effort; some signs or symptoms of disease. 72   - Cares for self; unable to carry on normal activity or to do active work. 60   - Requires occasional assistance, but is able to care for most of his personal needs. 50   - Requires considerable assistance and frequent medical care. 65   - Disabled; requires special care and assistance. 38   - Severely disabled; hospital admission is indicated although death not imminent. 19   - Very sick; hospital admission necessary; active supportive treatment necessary. 10   - Moribund; fatal processes progressing rapidly. 0     - Dead  Karnofsky DA, Abelmann Overton, Craver LS and Burchenal [facility] [phone]) The use of the nitrogen mustards in the palliative treatment of carcinoma: with particular reference to bronchogenic carcinoma Cancer 1 634-56  LABORATORY DATA:  Lab Results  Component Value Date   WBC 10.5 01/06/2019    HGB 13.0 01/06/2019   HCT 39.8 01/06/2019  MCV 89.4 01/06/2019   PLT 228 01/06/2019   Lab Results  Component Value Date   NA 136 01/06/2019   K 4.2 01/06/2019   CL 104 01/06/2019   CO2 23 01/06/2019   Lab Results  Component Value Date   ALT 15 01/06/2019   AST 16 01/06/2019   ALKPHOS 80 01/06/2019   BILITOT 0.7 01/06/2019     RADIOGRAPHY: Dg Chest Port 1 View  Result Date: 12/30/2018 CLINICAL DATA:  Post bronchoscopy EXAM: PORTABLE CHEST 1 VIEW COMPARISON:  11/16/2018 FINDINGS: Right lower lobe opacity again noted. No pneumothorax following bronchoscopy. Heart is normal size. No effusions. IMPRESSION: No pneumothorax following bronchoscopy. Electronically Signed   By: Rolm Baptise M.D.   On: 12/30/2018 15:27   Dg C-arm Bronchoscopy  Result Date: 12/30/2018 C-ARM BRONCHOSCOPY: Fluoroscopy was utilized by the requesting physician.  No radiographic interpretation.      IMPRESSION/PLAN:  This visit was conducted via MyChart to spare the patient unnecessary potential exposure in the healthcare setting during the current COVID-19 pandemic.  1. 78 y.o. gentleman with stage IIIb (T3, N2, M0) non-small cell lung cancer, squamous cell carcinoma presented with right lower lobe lung mass in addition to subcarinal lymphadenopathy diagnosed in October 2020. Today, we talked to the patient about the findings and workup thus far. We discussed the natural history of lung cancer and general treatment, highlighting the role of concurrent chemoradiation in the management. We discussed the available radiation techniques, and focused on the details of logistics and delivery. The recommendation is for a 6.5 week course of daily radiotherapy concurrent with weekly systemic chemotherapy.  We reviewed the anticipated acute and late sequelae associated with radiation in this setting. The patient was encouraged to ask questions that were answered to his satisfaction.  At the end of the conversation the  patient is interested in moving forward with daily radiotherapy directed at the RLL lesion and involved mediastinal lymph nodes concurrent with weekly systemic chemotherapy. He has provided verbal consent to proceed and is prepared to sign formal written consent at the time of his CT simulation scheduled for 9:30 am on 01/11/2019.We will share our discussion with Dr. Julien Nordmann and move forward with treatment planning accordingly in anticipation of beginning his radiation treatment on 01/16/19.  We enjoyed meeting him today and look forward to participating in his care.  Given current concerns for patient exposure during the COVID-19 pandemic, this encounter was conducted via video-enabled MyChart visit. The patient has given verbal consent for this type of encounter. The time spent during this encounter was 60 minutes. The attendants for this meeting include Tyler Pita MD, Ashlyn Bruning PA-C, [place], and patient, Martin Bailey. During the encounter, Tyler Pita MD, Ashlyn Bruning PA-C, and scribe, Wilburn Mylar were located at [place].  Patient, Martin Bailey was located at home.    Nicholos Johns, PA-C    Tyler Pita, MD  [facility] Direct Dial: [phone]   Fax: [phone] [email or web address]   Skype   [email or web address]  This document serves as a record of services personally performed by Tyler Pita, MD and Freeman Caldron, PA-C. It was created on their behalf by Wilburn Mylar, a trained medical scribe. The creation of this record is based on the scribe's personal observations and the provider's statements to them. This document has been checked and approved by the attending provider.

## 2019-01-11 ENCOUNTER — Ambulatory Visit
Admission: RE | Admit: 2019-01-11 | Discharge: 2019-01-11 | Disposition: A | Discharge status: Home / Self Care | Payer: Medicare Other | Source: Ambulatory Visit | Attending: Radiation Oncology | Admitting: Radiation Oncology

## 2019-01-11 ENCOUNTER — Other Ambulatory Visit: Payer: Self-pay

## 2019-01-11 DIAGNOSIS — Z510 Encounter for antineoplastic radiation therapy: Secondary | ICD-10-CM | POA: Insufficient documentation

## 2019-01-11 DIAGNOSIS — C3491 Malignant neoplasm of unspecified part of right bronchus or lung: Secondary | ICD-10-CM

## 2019-01-11 DIAGNOSIS — C3431 Malignant neoplasm of lower lobe, right bronchus or lung: Secondary | ICD-10-CM | POA: Insufficient documentation

## 2019-01-11 NOTE — Progress Notes (Signed)
  Radiation Oncology         (336) [phone] ________________________________  Name: Martin Bailey MRN: [id number]  Date: 01/11/2019  DOB: Feb 17, 1941  SIMULATION AND TREATMENT PLANNING NOTE    ICD-10-CM   1. Stage III squamous cell carcinoma of right lung (HCC)  C34.91     DIAGNOSIS:  78 y.o. gentleman with stage IIIb (T3, N2, M0) squamous cell carcinoma presented of the right lower lung  NARRATIVE:  The patient was brought to the [facility].  Identity was confirmed.  All relevant records and images related to the planned course of therapy were reviewed.  The patient freely provided informed written consent to proceed with treatment after reviewing the details related to the planned course of therapy. The consent form was witnessed and verified by the simulation staff.  Then, the patient was set-up in a stable reproducible  supine position for radiation therapy.  CT images were obtained.  Surface markings were placed.  The CT images were loaded into the planning software.  Then the target and avoidance structures were contoured.  Treatment planning then occurred.  The radiation prescription was entered and confirmed.  Then, I designed and supervised the construction of a total of 6 medically necessary complex treatment devices, including a BodyFix immobilization mold custom fitted to the patient along with 5 multileaf collimators conformally shaped radiation around the treatment target while shielding critical structures such as the heart and spinal cord maximally.  I have requested : 3D Simulation  I have requested a DVH of the following structures: Left lung, right lung, spinal cord, heart, esophagus, and target.  I have ordered:Nutrition Consult  SPECIAL TREATMENT PROCEDURE:  The planned course of therapy using radiation constitutes a special treatment procedure. Special care is required in the management of this patient for the following reasons.  The patient will be receiving  concurrent chemotherapy requiring careful monitoring for increased toxicities of treatment including periodic laboratory values.  The special nature of the planned course of radiotherapy will require increased physician supervision and oversight to ensure patient's safety with optimal treatment outcomes.  PLAN:  The patient will receive 66 Gy in 33 fractions.  ________________________________  Sheral Apley Tammi Klippel, M.D.

## 2019-01-12 ENCOUNTER — Telehealth: Payer: Self-pay | Admitting: Pulmonary Disease

## 2019-01-12 DIAGNOSIS — H2512 Age-related nuclear cataract, left eye: Secondary | ICD-10-CM | POA: Diagnosis not present

## 2019-01-12 DIAGNOSIS — H25012 Cortical age-related cataract, left eye: Secondary | ICD-10-CM | POA: Diagnosis not present

## 2019-01-12 DIAGNOSIS — H25812 Combined forms of age-related cataract, left eye: Secondary | ICD-10-CM | POA: Diagnosis not present

## 2019-01-12 NOTE — Telephone Encounter (Signed)
Spoke with [facility] from oncology. She stated that she was calling to advised Dr. Ander Slade that the pathologist was making an addendum to the cytology report from 12/30/2018. The section that will be changed will be section C discussing the lymph nodes.   Made her aware that the patient has already been established with oncology. She verbalized understanding.   Nothing further needed.

## 2019-01-13 ENCOUNTER — Encounter: Payer: Self-pay | Admitting: *Deleted

## 2019-01-13 ENCOUNTER — Encounter: Payer: Self-pay | Admitting: Internal Medicine

## 2019-01-13 ENCOUNTER — Inpatient Hospital Stay: Payer: Medicare Other

## 2019-01-13 ENCOUNTER — Other Ambulatory Visit: Payer: Self-pay

## 2019-01-13 ENCOUNTER — Other Ambulatory Visit: Payer: Medicare Other

## 2019-01-13 DIAGNOSIS — C3431 Malignant neoplasm of lower lobe, right bronchus or lung: Secondary | ICD-10-CM | POA: Diagnosis not present

## 2019-01-13 DIAGNOSIS — Z510 Encounter for antineoplastic radiation therapy: Secondary | ICD-10-CM | POA: Diagnosis not present

## 2019-01-13 NOTE — Progress Notes (Signed)
Oncology Nurse Navigator Documentation  Oncology Nurse Navigator Flowsheets 01/13/2019  Diagnosis Status Confirmed Diagnosis Complete  Phase of Treatment Chemo/Radiation Concurrent  Navigator Follow Up Date: 01/13/2019  Navigator Follow Up Reason: Appointment Review  Navigator Location [facility]  Referral Date to RadOnc/MedOnc -  Navigator Encounter Type Other/I followed up on patient's schedule.  All appts are set up for treatment plan.  No barriers identified at this time.   Telephone -  Abnormal Finding Date 11/16/2018  Confirmed Diagnosis Date 12/30/2018  Treatment Initiated Date 01/11/2019  Treatment Phase Treatment  Barriers/Navigation Needs Coordination of Care  Education -  Interventions Coordination of Care  Coordination of Care Other  Education Method -  Acuity Level 1-No Barriers  Time Spent with Patient 15

## 2019-01-13 NOTE — Progress Notes (Signed)
Met with patient in lobby to introduce myself as Arboriculturist and to offer available resources.  Discussed one-time $61 Engineer, drilling to assist with personal expenses while going through treatment. He declined and states he would rather it be for someone else.  Gave him my card for any other additional financial questions or concerns.

## 2019-01-14 DIAGNOSIS — C3492 Malignant neoplasm of unspecified part of left bronchus or lung: Secondary | ICD-10-CM | POA: Diagnosis not present

## 2019-01-16 ENCOUNTER — Other Ambulatory Visit: Payer: Medicare Other

## 2019-01-16 ENCOUNTER — Ambulatory Visit
Admission: RE | Admit: 2019-01-16 | Discharge: 2019-01-16 | Disposition: A | Discharge status: Home / Self Care | Payer: Medicare Other | Source: Ambulatory Visit | Attending: Radiation Oncology | Admitting: Radiation Oncology

## 2019-01-16 ENCOUNTER — Inpatient Hospital Stay: Payer: Medicare Other

## 2019-01-16 ENCOUNTER — Ambulatory Visit: Payer: Medicare Other

## 2019-01-16 ENCOUNTER — Other Ambulatory Visit: Payer: Self-pay

## 2019-01-16 VITALS — BP 111/59 | HR 63 | Temp 97.8°F | Resp 16

## 2019-01-16 DIAGNOSIS — C3431 Malignant neoplasm of lower lobe, right bronchus or lung: Secondary | ICD-10-CM | POA: Diagnosis not present

## 2019-01-16 DIAGNOSIS — I4891 Unspecified atrial fibrillation: Secondary | ICD-10-CM | POA: Diagnosis not present

## 2019-01-16 DIAGNOSIS — Z87891 Personal history of nicotine dependence: Secondary | ICD-10-CM | POA: Diagnosis not present

## 2019-01-16 DIAGNOSIS — Z23 Encounter for immunization: Secondary | ICD-10-CM

## 2019-01-16 DIAGNOSIS — E785 Hyperlipidemia, unspecified: Secondary | ICD-10-CM | POA: Diagnosis not present

## 2019-01-16 DIAGNOSIS — I10 Essential (primary) hypertension: Secondary | ICD-10-CM | POA: Diagnosis not present

## 2019-01-16 DIAGNOSIS — Z7901 Long term (current) use of anticoagulants: Secondary | ICD-10-CM | POA: Diagnosis not present

## 2019-01-16 DIAGNOSIS — Z79899 Other long term (current) drug therapy: Secondary | ICD-10-CM | POA: Diagnosis not present

## 2019-01-16 DIAGNOSIS — Z801 Family history of malignant neoplasm of trachea, bronchus and lung: Secondary | ICD-10-CM | POA: Diagnosis not present

## 2019-01-16 DIAGNOSIS — C3491 Malignant neoplasm of unspecified part of right bronchus or lung: Secondary | ICD-10-CM

## 2019-01-16 DIAGNOSIS — Z510 Encounter for antineoplastic radiation therapy: Secondary | ICD-10-CM | POA: Diagnosis not present

## 2019-01-16 DIAGNOSIS — Z5111 Encounter for antineoplastic chemotherapy: Secondary | ICD-10-CM | POA: Diagnosis not present

## 2019-01-16 LAB — CMP (CANCER CENTER ONLY)
ALT: 13 U/L (ref 0–44)
AST: 16 U/L (ref 15–41)
Albumin: 3.2 g/dL — ABNORMAL LOW (ref 3.5–5.0)
Alkaline Phosphatase: 74 U/L (ref 38–126)
Anion gap: 11 (ref 5–15)
BUN: 11 mg/dL (ref 8–23)
CO2: 21 mmol/L — ABNORMAL LOW (ref 22–32)
Calcium: 9 mg/dL (ref 8.9–10.3)
Chloride: 105 mmol/L (ref 98–111)
Creatinine: 0.81 mg/dL (ref 0.61–1.24)
GFR, Est AFR Am: >60 mL/min (ref >60)
GFR, Estimated: >60 mL/min (ref >60)
Glucose, Bld: 124 mg/dL — ABNORMAL HIGH (ref 70–99)
Potassium: 4.3 mmol/L (ref 3.5–5.1)
Sodium: 137 mmol/L (ref 135–145)
Total Bilirubin: 0.4 mg/dL (ref 0.3–1.2)
Total Protein: 7.2 g/dL (ref 6.5–8.1)

## 2019-01-16 LAB — CBC WITH DIFFERENTIAL (CANCER CENTER ONLY)
Abs Immature Granulocytes: 0.03 10*3/uL (ref 0.00–0.07)
Basophils Absolute: 0.1 10*3/uL (ref 0.0–0.1)
Basophils Relative: 1 %
Eosinophils Absolute: 0.1 10*3/uL (ref 0.0–0.5)
Eosinophils Relative: 1 %
HCT: 38.9 % — ABNORMAL LOW (ref 39.0–52.0)
Hemoglobin: 12.8 g/dL — ABNORMAL LOW (ref 13.0–17.0)
Immature Granulocytes: 0 %
Lymphocytes Relative: 14 %
Lymphs Abs: 1.3 10*3/uL (ref 0.7–4.0)
MCH: 28.6 pg (ref 26.0–34.0)
MCHC: 32.9 g/dL (ref 30.0–36.0)
MCV: 87 fL (ref 80.0–100.0)
Monocytes Absolute: 0.7 10*3/uL (ref 0.1–1.0)
Monocytes Relative: 8 %
Neutro Abs: 7.1 10*3/uL (ref 1.7–7.7)
Neutrophils Relative %: 76 %
Platelet Count: 186 10*3/uL (ref 150–400)
RBC: 4.47 MIL/uL (ref 4.22–5.81)
RDW: 13.3 % (ref 11.5–15.5)
WBC Count: 9.3 10*3/uL (ref 4.0–10.5)
nRBC: 0 % (ref 0.0–0.2)

## 2019-01-16 MED ORDER — SODIUM CHLORIDE 0.9 % IV SOLN
205.4000 mg | Freq: Once | INTRAVENOUS | Status: AC
  Administered 2019-01-16: 13:00:00 210 mg via INTRAVENOUS
  Filled 2019-01-16: qty 21

## 2019-01-16 MED ORDER — FAMOTIDINE IN NACL 20-0.9 MG/50ML-% IV SOLN
20.0000 mg | Freq: Once | INTRAVENOUS | Status: AC
  Administered 2019-01-16: 10:00:00 20 mg via INTRAVENOUS

## 2019-01-16 MED ORDER — SODIUM CHLORIDE 0.9 % IV SOLN
20.0000 mg | Freq: Once | INTRAVENOUS | Status: AC
  Administered 2019-01-16: 20 mg via INTRAVENOUS
  Filled 2019-01-16: qty 20

## 2019-01-16 MED ORDER — PALONOSETRON HCL INJECTION 0.25 MG/5ML
INTRAVENOUS | Status: AC
  Filled 2019-01-16: qty 5

## 2019-01-16 MED ORDER — FAMOTIDINE IN NACL 20-0.9 MG/50ML-% IV SOLN
INTRAVENOUS | Status: AC
  Filled 2019-01-16: qty 50

## 2019-01-16 MED ORDER — SODIUM CHLORIDE 0.9 % IV SOLN
45.0000 mg/m2 | Freq: Once | INTRAVENOUS | Status: AC
  Administered 2019-01-16: 12:00:00 96 mg via INTRAVENOUS
  Filled 2019-01-16: qty 16

## 2019-01-16 MED ORDER — DIPHENHYDRAMINE HCL 50 MG/ML IJ SOLN
50.0000 mg | Freq: Once | INTRAMUSCULAR | Status: AC
  Administered 2019-01-16: 50 mg via INTRAVENOUS

## 2019-01-16 MED ORDER — DIPHENHYDRAMINE HCL 50 MG/ML IJ SOLN
INTRAMUSCULAR | Status: AC
  Filled 2019-01-16: qty 1

## 2019-01-16 MED ORDER — SODIUM CHLORIDE 0.9 % IV SOLN
Freq: Once | INTRAVENOUS | Status: AC
  Administered 2019-01-16: 10:00:00 via INTRAVENOUS
  Filled 2019-01-16: qty 250

## 2019-01-16 MED ORDER — PALONOSETRON HCL INJECTION 0.25 MG/5ML
0.2500 mg | Freq: Once | INTRAVENOUS | Status: AC
  Administered 2019-01-16: 10:00:00 0.25 mg via INTRAVENOUS

## 2019-01-16 MED ORDER — INFLUENZA VAC A&B SA ADJ QUAD 0.5 ML IM PRSY
0.5000 mL | PREFILLED_SYRINGE | Freq: Once | INTRAMUSCULAR | Status: AC
  Administered 2019-01-16: 13:00:00 0.5 mL via INTRAMUSCULAR

## 2019-01-16 MED ORDER — INFLUENZA VAC A&B SA ADJ QUAD 0.5 ML IM PRSY
PREFILLED_SYRINGE | INTRAMUSCULAR | Status: AC
  Filled 2019-01-16: qty 0.5

## 2019-01-16 NOTE — Progress Notes (Signed)
Pt has been to chemo education class.

## 2019-01-16 NOTE — Patient Instructions (Signed)
[facility] Discharge Instructions for Patients Receiving Chemotherapy  Today you received the following chemotherapy agents: Paclitaxel (Taxol) & Carboplatin  To help prevent nausea and vomiting after your treatment, we encourage you to take your nausea medication as prescribed.   If you develop nausea and vomiting that is not controlled by your nausea medication, call the clinic.   BELOW ARE SYMPTOMS THAT SHOULD BE REPORTED IMMEDIATELY:  *FEVER GREATER THAN 100.5 F  *CHILLS WITH OR WITHOUT FEVER  NAUSEA AND VOMITING THAT IS NOT CONTROLLED WITH YOUR NAUSEA MEDICATION  *UNUSUAL SHORTNESS OF BREATH  *UNUSUAL BRUISING OR BLEEDING  TENDERNESS IN MOUTH AND THROAT WITH OR WITHOUT PRESENCE OF ULCERS  *URINARY PROBLEMS  *BOWEL PROBLEMS  UNUSUAL RASH Items with * indicate a potential emergency and should be followed up as soon as possible.  Feel free to call the clinic should you have any questions or concerns. The clinic phone number is [phone].  Please show the [facility] at check-in to the Emergency Department and triage nurse.     Paclitaxel injection What is this medicine? PACLITAXEL (PAK li TAX el) is a chemotherapy drug. It targets fast dividing cells, like cancer cells, and causes these cells to die. This medicine is used to treat ovarian cancer, breast cancer, lung cancer, Kaposi's sarcoma, and other cancers. This medicine may be used for other purposes; ask your health care provider or pharmacist if you have questions. COMMON BRAND NAME(S): Onxol, Taxol What should I tell my health care provider before I take this medicine? They need to know if you have any of these conditions:  history of irregular heartbeat  liver disease  low blood counts, like low white cell, platelet, or red cell counts  lung or breathing disease, like asthma  tingling of the fingers or toes, or other nerve disorder  an unusual or allergic reaction to  paclitaxel, alcohol, polyoxyethylated castor oil, other chemotherapy, other medicines, foods, dyes, or preservatives  pregnant or trying to get pregnant  breast-feeding How should I use this medicine? This drug is given as an infusion into a vein. It is administered in a hospital or clinic by a specially trained health care professional. Talk to your pediatrician regarding the use of this medicine in children. Special care may be needed. Overdosage: If you think you have taken too much of this medicine contact a poison control center or emergency room at once. NOTE: This medicine is only for you. Do not share this medicine with others. What if I miss a dose? It is important not to miss your dose. Call your doctor or health care professional if you are unable to keep an appointment. What may interact with this medicine? Do not take this medicine with any of the following medications:  disulfiram  metronidazole This medicine may also interact with the following medications:  antiviral medicines for hepatitis, HIV or AIDS  certain antibiotics like erythromycin and clarithromycin  certain medicines for fungal infections like ketoconazole and itraconazole  certain medicines for seizures like carbamazepine, phenobarbital, phenytoin  gemfibrozil  nefazodone  rifampin  [place]'s wort This list may not describe all possible interactions. Give your health care provider a list of all the medicines, herbs, non-prescription drugs, or dietary supplements you use. Also tell them if you smoke, drink alcohol, or use illegal drugs. Some items may interact with your medicine. What should I watch for while using this medicine? Your condition will be monitored carefully while you are receiving this medicine.  You will need important blood work done while you are taking this medicine. This medicine can cause serious allergic reactions. To reduce your risk you will need to take other medicine(s)  before treatment with this medicine. If you experience allergic reactions like skin rash, itching or hives, swelling of the face, lips, or tongue, tell your doctor or health care professional right away. In some cases, you may be given additional medicines to help with side effects. Follow all directions for their use. This drug may make you feel generally unwell. This is not uncommon, as chemotherapy can affect healthy cells as well as cancer cells. Report any side effects. Continue your course of treatment even though you feel ill unless your doctor tells you to stop. Call your doctor or health care professional for advice if you get a fever, chills or sore throat, or other symptoms of a cold or flu. Do not treat yourself. This drug decreases your body's ability to fight infections. Try to avoid being around people who are sick. This medicine may increase your risk to bruise or bleed. Call your doctor or health care professional if you notice any unusual bleeding. Be careful brushing and flossing your teeth or using a toothpick because you may get an infection or bleed more easily. If you have any dental work done, tell your dentist you are receiving this medicine. Avoid taking products that contain aspirin, acetaminophen, ibuprofen, naproxen, or ketoprofen unless instructed by your doctor. These medicines may hide a fever. Do not become pregnant while taking this medicine. Women should inform their doctor if they wish to become pregnant or think they might be pregnant. There is a potential for serious side effects to an unborn child. Talk to your health care professional or pharmacist for more information. Do not breast-feed an infant while taking this medicine. Men are advised not to father a child while receiving this medicine. This product may contain alcohol. Ask your pharmacist or healthcare provider if this medicine contains alcohol. Be sure to tell all healthcare providers you are taking this  medicine. Certain medicines, like metronidazole and disulfiram, can cause an unpleasant reaction when taken with alcohol. The reaction includes flushing, headache, nausea, vomiting, sweating, and increased thirst. The reaction can last from 30 minutes to several hours. What side effects may I notice from receiving this medicine? Side effects that you should report to your doctor or health care professional as soon as possible:  allergic reactions like skin rash, itching or hives, swelling of the face, lips, or tongue  breathing problems  changes in vision  fast, irregular heartbeat  high or low blood pressure  mouth sores  pain, tingling, numbness in the hands or feet  signs of decreased platelets or bleeding - bruising, pinpoint red spots on the skin, black, tarry stools, blood in the urine  signs of decreased red blood cells - unusually weak or tired, feeling faint or lightheaded, falls  signs of infection - fever or chills, cough, sore throat, pain or difficulty passing urine  signs and symptoms of liver injury like dark yellow or brown urine; general ill feeling or flu-like symptoms; light-colored stools; loss of appetite; nausea; right upper belly pain; unusually weak or tired; yellowing of the eyes or skin  swelling of the ankles, feet, hands  unusually slow heartbeat Side effects that usually do not require medical attention (report to your doctor or health care professional if they continue or are bothersome):  diarrhea  hair loss  loss of appetite  muscle or joint pain  nausea, vomiting  pain, redness, or irritation at site where injected  tiredness This list may not describe all possible side effects. Call your doctor for medical advice about side effects. You may report side effects to FDA at 1-800-FDA-1088. Where should I keep my medicine? This drug is given in a hospital or clinic and will not be stored at home. NOTE: This sheet is a summary. It may not  cover all possible information. If you have questions about this medicine, talk to your doctor, pharmacist, or health care provider.  2020 [employer] Standard (2016-11-17 13:14:55)    Carboplatin injection What is this medicine? CARBOPLATIN (KAR boe pla tin) is a chemotherapy drug. It targets fast dividing cells, like cancer cells, and causes these cells to die. This medicine is used to treat ovarian cancer and many other cancers. This medicine may be used for other purposes; ask your health care provider or pharmacist if you have questions. COMMON BRAND NAME(S): Paraplatin What should I tell my health care provider before I take this medicine? They need to know if you have any of these conditions:  blood disorders  hearing problems  kidney disease  recent or ongoing radiation therapy  an unusual or allergic reaction to carboplatin, cisplatin, other chemotherapy, other medicines, foods, dyes, or preservatives  pregnant or trying to get pregnant  breast-feeding How should I use this medicine? This drug is usually given as an infusion into a vein. It is administered in a hospital or clinic by a specially trained health care professional. Talk to your pediatrician regarding the use of this medicine in children. Special care may be needed. Overdosage: If you think you have taken too much of this medicine contact a poison control center or emergency room at once. NOTE: This medicine is only for you. Do not share this medicine with others. What if I miss a dose? It is important not to miss a dose. Call your doctor or health care professional if you are unable to keep an appointment. What may interact with this medicine?  medicines for seizures  medicines to increase blood counts like filgrastim, pegfilgrastim, sargramostim  some antibiotics like amikacin, gentamicin, neomycin, streptomycin, tobramycin  vaccines Talk to your doctor or health care professional before taking any  of these medicines:  acetaminophen  aspirin  ibuprofen  ketoprofen  naproxen This list may not describe all possible interactions. Give your health care provider a list of all the medicines, herbs, non-prescription drugs, or dietary supplements you use. Also tell them if you smoke, drink alcohol, or use illegal drugs. Some items may interact with your medicine. What should I watch for while using this medicine? Your condition will be monitored carefully while you are receiving this medicine. You will need important blood work done while you are taking this medicine. This drug may make you feel generally unwell. This is not uncommon, as chemotherapy can affect healthy cells as well as cancer cells. Report any side effects. Continue your course of treatment even though you feel ill unless your doctor tells you to stop. In some cases, you may be given additional medicines to help with side effects. Follow all directions for their use. Call your doctor or health care professional for advice if you get a fever, chills or sore throat, or other symptoms of a cold or flu. Do not treat yourself. This drug decreases your body's ability to fight infections. Try to avoid being around people who are sick. This medicine may  increase your risk to bruise or bleed. Call your doctor or health care professional if you notice any unusual bleeding. Be careful brushing and flossing your teeth or using a toothpick because you may get an infection or bleed more easily. If you have any dental work done, tell your dentist you are receiving this medicine. Avoid taking products that contain aspirin, acetaminophen, ibuprofen, naproxen, or ketoprofen unless instructed by your doctor. These medicines may hide a fever. Do not become pregnant while taking this medicine. Women should inform their doctor if they wish to become pregnant or think they might be pregnant. There is a potential for serious side effects to an unborn  child. Talk to your health care professional or pharmacist for more information. Do not breast-feed an infant while taking this medicine. What side effects may I notice from receiving this medicine? Side effects that you should report to your doctor or health care professional as soon as possible:  allergic reactions like skin rash, itching or hives, swelling of the face, lips, or tongue  signs of infection - fever or chills, cough, sore throat, pain or difficulty passing urine  signs of decreased platelets or bleeding - bruising, pinpoint red spots on the skin, black, tarry stools, nosebleeds  signs of decreased red blood cells - unusually weak or tired, fainting spells, lightheadedness  breathing problems  changes in hearing  changes in vision  chest pain  high blood pressure  low blood counts - This drug may decrease the number of white blood cells, red blood cells and platelets. You may be at increased risk for infections and bleeding.  nausea and vomiting  pain, swelling, redness or irritation at the injection site  pain, tingling, numbness in the hands or feet  problems with balance, talking, walking  trouble passing urine or change in the amount of urine Side effects that usually do not require medical attention (report to your doctor or health care professional if they continue or are bothersome):  hair loss  loss of appetite  metallic taste in the mouth or changes in taste This list may not describe all possible side effects. Call your doctor for medical advice about side effects. You may report side effects to FDA at 1-800-FDA-1088. Where should I keep my medicine? This drug is given in a hospital or clinic and will not be stored at home. NOTE: This sheet is a summary. It may not cover all possible information. If you have questions about this medicine, talk to your doctor, pharmacist, or health care provider.  2020 [employer]/Gold Standard (2007-06-21  14:38:05)

## 2019-01-17 ENCOUNTER — Ambulatory Visit
Admission: RE | Admit: 2019-01-17 | Discharge: 2019-01-17 | Disposition: A | Discharge status: Home / Self Care | Payer: Medicare Other | Source: Ambulatory Visit | Attending: Radiation Oncology | Admitting: Radiation Oncology

## 2019-01-17 ENCOUNTER — Telehealth: Payer: Self-pay | Admitting: *Deleted

## 2019-01-17 ENCOUNTER — Other Ambulatory Visit: Payer: Self-pay

## 2019-01-17 DIAGNOSIS — Z510 Encounter for antineoplastic radiation therapy: Secondary | ICD-10-CM | POA: Diagnosis not present

## 2019-01-17 DIAGNOSIS — C3431 Malignant neoplasm of lower lobe, right bronchus or lung: Secondary | ICD-10-CM | POA: Diagnosis not present

## 2019-01-18 ENCOUNTER — Other Ambulatory Visit: Payer: Self-pay

## 2019-01-18 ENCOUNTER — Ambulatory Visit
Admission: RE | Admit: 2019-01-18 | Discharge: 2019-01-18 | Disposition: A | Discharge status: Home / Self Care | Payer: Medicare Other | Source: Ambulatory Visit | Attending: Radiation Oncology | Admitting: Radiation Oncology

## 2019-01-18 DIAGNOSIS — Z510 Encounter for antineoplastic radiation therapy: Secondary | ICD-10-CM | POA: Diagnosis not present

## 2019-01-18 DIAGNOSIS — C3431 Malignant neoplasm of lower lobe, right bronchus or lung: Secondary | ICD-10-CM | POA: Diagnosis not present

## 2019-01-19 ENCOUNTER — Ambulatory Visit
Admission: RE | Admit: 2019-01-19 | Discharge: 2019-01-19 | Disposition: A | Discharge status: Home / Self Care | Payer: Medicare Other | Source: Ambulatory Visit | Attending: Radiation Oncology | Admitting: Radiation Oncology

## 2019-01-19 ENCOUNTER — Other Ambulatory Visit: Payer: Self-pay

## 2019-01-19 DIAGNOSIS — C3431 Malignant neoplasm of lower lobe, right bronchus or lung: Secondary | ICD-10-CM | POA: Diagnosis not present

## 2019-01-19 DIAGNOSIS — Z510 Encounter for antineoplastic radiation therapy: Secondary | ICD-10-CM | POA: Diagnosis not present

## 2019-01-20 ENCOUNTER — Other Ambulatory Visit: Payer: Self-pay

## 2019-01-20 ENCOUNTER — Ambulatory Visit (HOSPITAL_COMMUNITY)
Admission: RE | Admit: 2019-01-20 | Discharge: 2019-01-20 | Disposition: A | Discharge status: Home / Self Care | Payer: Medicare Other | Source: Ambulatory Visit | Attending: Physician Assistant | Admitting: Physician Assistant

## 2019-01-20 ENCOUNTER — Ambulatory Visit
Admission: RE | Admit: 2019-01-20 | Discharge: 2019-01-20 | Disposition: A | Discharge status: Home / Self Care | Payer: Medicare Other | Source: Ambulatory Visit | Attending: Radiation Oncology | Admitting: Radiation Oncology

## 2019-01-20 DIAGNOSIS — C3431 Malignant neoplasm of lower lobe, right bronchus or lung: Secondary | ICD-10-CM | POA: Diagnosis not present

## 2019-01-20 DIAGNOSIS — C3491 Malignant neoplasm of unspecified part of right bronchus or lung: Secondary | ICD-10-CM | POA: Insufficient documentation

## 2019-01-20 DIAGNOSIS — Z510 Encounter for antineoplastic radiation therapy: Secondary | ICD-10-CM | POA: Diagnosis not present

## 2019-01-20 DIAGNOSIS — C3490 Malignant neoplasm of unspecified part of unspecified bronchus or lung: Secondary | ICD-10-CM | POA: Diagnosis not present

## 2019-01-20 LAB — GUARDANT 360

## 2019-01-20 MED ORDER — GADOBUTROL 1 MMOL/ML IV SOLN
10.0000 mL | Freq: Once | INTRAVENOUS | Status: AC | PRN
  Administered 2019-01-20: 9 mL via INTRAVENOUS

## 2019-01-20 MED ORDER — SONAFINE EX EMUL
1.0000 "application " | Freq: Two times a day (BID) | CUTANEOUS | Status: DC
  Administered 2019-01-20: 1 via TOPICAL

## 2019-01-23 ENCOUNTER — Ambulatory Visit
Admission: RE | Admit: 2019-01-23 | Discharge: 2019-01-23 | Disposition: A | Discharge status: Home / Self Care | Payer: Medicare Other | Source: Ambulatory Visit | Attending: Radiation Oncology | Admitting: Radiation Oncology

## 2019-01-23 ENCOUNTER — Inpatient Hospital Stay: Payer: Medicare Other

## 2019-01-23 ENCOUNTER — Inpatient Hospital Stay: Payer: Medicare Other | Admitting: Physician Assistant

## 2019-01-23 ENCOUNTER — Other Ambulatory Visit: Payer: Self-pay

## 2019-01-23 ENCOUNTER — Encounter: Payer: Self-pay | Admitting: Physician Assistant

## 2019-01-23 VITALS — BP 107/72 | HR 74 | Temp 97.8°F | Resp 18 | Ht 70.0 in | Wt 193.5 lb

## 2019-01-23 DIAGNOSIS — Z79899 Other long term (current) drug therapy: Secondary | ICD-10-CM | POA: Diagnosis not present

## 2019-01-23 DIAGNOSIS — I10 Essential (primary) hypertension: Secondary | ICD-10-CM | POA: Diagnosis not present

## 2019-01-23 DIAGNOSIS — Z5111 Encounter for antineoplastic chemotherapy: Secondary | ICD-10-CM

## 2019-01-23 DIAGNOSIS — C3491 Malignant neoplasm of unspecified part of right bronchus or lung: Secondary | ICD-10-CM | POA: Diagnosis not present

## 2019-01-23 DIAGNOSIS — C3431 Malignant neoplasm of lower lobe, right bronchus or lung: Secondary | ICD-10-CM | POA: Diagnosis not present

## 2019-01-23 DIAGNOSIS — Z87891 Personal history of nicotine dependence: Secondary | ICD-10-CM | POA: Diagnosis not present

## 2019-01-23 DIAGNOSIS — Z510 Encounter for antineoplastic radiation therapy: Secondary | ICD-10-CM | POA: Diagnosis not present

## 2019-01-23 DIAGNOSIS — Z7901 Long term (current) use of anticoagulants: Secondary | ICD-10-CM | POA: Diagnosis not present

## 2019-01-23 DIAGNOSIS — Z23 Encounter for immunization: Secondary | ICD-10-CM | POA: Diagnosis not present

## 2019-01-23 DIAGNOSIS — I4891 Unspecified atrial fibrillation: Secondary | ICD-10-CM | POA: Diagnosis not present

## 2019-01-23 DIAGNOSIS — Z801 Family history of malignant neoplasm of trachea, bronchus and lung: Secondary | ICD-10-CM | POA: Diagnosis not present

## 2019-01-23 DIAGNOSIS — E785 Hyperlipidemia, unspecified: Secondary | ICD-10-CM | POA: Diagnosis not present

## 2019-01-23 LAB — CMP (CANCER CENTER ONLY)
ALT: 16 U/L (ref 0–44)
AST: 15 U/L (ref 15–41)
Albumin: 2.8 g/dL — ABNORMAL LOW (ref 3.5–5.0)
Alkaline Phosphatase: 79 U/L (ref 38–126)
Anion gap: 9 (ref 5–15)
BUN: 15 mg/dL (ref 8–23)
CO2: 23 mmol/L (ref 22–32)
Calcium: 8.8 mg/dL — ABNORMAL LOW (ref 8.9–10.3)
Chloride: 101 mmol/L (ref 98–111)
Creatinine: 0.79 mg/dL (ref 0.61–1.24)
GFR, Est AFR Am: >60 mL/min (ref >60)
GFR, Estimated: >60 mL/min (ref >60)
Glucose, Bld: 98 mg/dL (ref 70–99)
Potassium: 4.8 mmol/L (ref 3.5–5.1)
Sodium: 133 mmol/L — ABNORMAL LOW (ref 135–145)
Total Bilirubin: 0.7 mg/dL (ref 0.3–1.2)
Total Protein: 6.5 g/dL (ref 6.5–8.1)

## 2019-01-23 LAB — CBC WITH DIFFERENTIAL (CANCER CENTER ONLY)
Abs Immature Granulocytes: 0.09 10*3/uL — ABNORMAL HIGH (ref 0.00–0.07)
Basophils Absolute: 0 10*3/uL (ref 0.0–0.1)
Basophils Relative: 0 %
Eosinophils Absolute: 0 10*3/uL (ref 0.0–0.5)
Eosinophils Relative: 0 %
HCT: 34.9 % — ABNORMAL LOW (ref 39.0–52.0)
Hemoglobin: 11.4 g/dL — ABNORMAL LOW (ref 13.0–17.0)
Immature Granulocytes: 1 %
Lymphocytes Relative: 11 %
Lymphs Abs: 1.2 10*3/uL (ref 0.7–4.0)
MCH: 29.1 pg (ref 26.0–34.0)
MCHC: 32.7 g/dL (ref 30.0–36.0)
MCV: 89 fL (ref 80.0–100.0)
Monocytes Absolute: 1.1 10*3/uL — ABNORMAL HIGH (ref 0.1–1.0)
Monocytes Relative: 10 %
Neutro Abs: 8.9 10*3/uL — ABNORMAL HIGH (ref 1.7–7.7)
Neutrophils Relative %: 78 %
Platelet Count: 172 10*3/uL (ref 150–400)
RBC: 3.92 MIL/uL — ABNORMAL LOW (ref 4.22–5.81)
RDW: 13.3 % (ref 11.5–15.5)
WBC Count: 11.3 10*3/uL — ABNORMAL HIGH (ref 4.0–10.5)
nRBC: 0 % (ref 0.0–0.2)

## 2019-01-23 MED ORDER — SODIUM CHLORIDE 0.9 % IV SOLN
20.0000 mg | Freq: Once | INTRAVENOUS | Status: AC
  Administered 2019-01-23: 20 mg via INTRAVENOUS
  Filled 2019-01-23: qty 20

## 2019-01-23 MED ORDER — PALONOSETRON HCL INJECTION 0.25 MG/5ML
INTRAVENOUS | Status: AC
  Filled 2019-01-23: qty 5

## 2019-01-23 MED ORDER — FAMOTIDINE IN NACL 20-0.9 MG/50ML-% IV SOLN
INTRAVENOUS | Status: AC
  Filled 2019-01-23: qty 50

## 2019-01-23 MED ORDER — LIDOCAINE-PRILOCAINE 2.5-2.5 % EX CREA: 1.0000 "application " | TOPICAL_CREAM | CUTANEOUS | 0 refills | Status: DC | PRN

## 2019-01-23 MED ORDER — SODIUM CHLORIDE 0.9 % IV SOLN: 205.4000 mg | Freq: Once | INTRAVENOUS | Status: DC

## 2019-01-23 MED ORDER — DIPHENHYDRAMINE HCL 50 MG/ML IJ SOLN
INTRAMUSCULAR | Status: AC
  Filled 2019-01-23: qty 1

## 2019-01-23 MED ORDER — SODIUM CHLORIDE 0.9 % IV SOLN
205.4000 mg | Freq: Once | INTRAVENOUS | Status: AC
  Administered 2019-01-23: 210 mg via INTRAVENOUS
  Filled 2019-01-23: qty 21

## 2019-01-23 MED ORDER — SODIUM CHLORIDE 0.9 % IV SOLN
Freq: Once | INTRAVENOUS | Status: AC
  Administered 2019-01-23: 13:00:00 via INTRAVENOUS
  Filled 2019-01-23: qty 250

## 2019-01-23 MED ORDER — FAMOTIDINE IN NACL 20-0.9 MG/50ML-% IV SOLN
20.0000 mg | Freq: Once | INTRAVENOUS | Status: AC
  Administered 2019-01-23: 20 mg via INTRAVENOUS

## 2019-01-23 MED ORDER — DIPHENHYDRAMINE HCL 50 MG/ML IJ SOLN
50.0000 mg | Freq: Once | INTRAMUSCULAR | Status: AC
  Administered 2019-01-23: 50 mg via INTRAVENOUS

## 2019-01-23 MED ORDER — PALONOSETRON HCL INJECTION 0.25 MG/5ML
0.2500 mg | Freq: Once | INTRAVENOUS | Status: AC
  Administered 2019-01-23: 0.25 mg via INTRAVENOUS

## 2019-01-23 MED ORDER — SODIUM CHLORIDE 0.9 % IV SOLN
45.0000 mg/m2 | Freq: Once | INTRAVENOUS | Status: AC
  Administered 2019-01-23: 96 mg via INTRAVENOUS
  Filled 2019-01-23: qty 16

## 2019-01-23 NOTE — Progress Notes (Signed)
[facility] OFFICE PROGRESS NOTE  [name], MD [place] [address]  DIAGNOSIS: Stage IIIb (T3, N2, M0) non-small cell lung cancer, squamous cell carcinoma presented with right lower lobe lung mass in addition to subcarinal lymphadenopathy diagnosed in October 2020.   PRIOR THERAPY: None  CURRENT THERAPY: Weekly concurrent chemoradiation with Carboplatin for an AUC of 2 Paclitaxel 45 mg/m2. First dose 01/16/2019. Status post 1 cycle.  INTERVAL HISTORY: Martin Bailey 78 y.o. male returns to the clinic for a follow up visit. The patient is feeling fairly well today without any concerning complaints except for a persistent cough which has been present for several years. He denies any fevers, sore throat, increased sputum production from baseline, shortness of breath, chest pain, or hemoptysis. He also notes that he tends to experience chills around dinner time and that this is an ongoing issue even prior to chemotherapy. He notes mild nausea and decreased appetite. He has not lost weight since his last visit. Denies vomiting. The patient states that he has poor venous access and that it takes multiple attempts for venipuncture and IVs.   Otherwise, he denies night sweats. He denies vomiting or diarrhea. He reports mild constipation. He denies headaches or visual changes. He had [employer] molecular testing performed recently to assess for any actionable mutations since the patient has a remote smoking history 41 years ago. He is here today for evaluation and to review his molecular studies before starting cycle #2.    MEDICAL HISTORY: Past Medical History:  Diagnosis Date  . Cataract    left  . COLONIC POLYPS, HX OF 04/05/2007   ONE ADENOMATOUS POLYP AND ONE HYPERPLASTIC POLYP  . DEPRESSION 03/04/2007  . DERMATITIS, SEBORRHEIC 07/19/2007  . FIBRILLATION, ATRIAL 03/04/2007  . Hearing aid worn    B/L  . HYPERLIPIDEMIA 03/04/2007  . HYPERTENSION  03/04/2007  . Kidney stones   . Lung cancer ([facility])    non small cell lung ca  . Lung mass    mediastinal adenopathy    ALLERGIES:  has No Known Allergies.  MEDICATIONS:  Current Outpatient Medications  Medication Sig Dispense Refill  . amLODipine (NORVASC) 5 MG tablet TAKE 1 TABLET BY MOUTH  DAILY (Patient taking differently: Take 5 mg by mouth daily. ) 90 tablet 3  . atenolol (TENORMIN) 50 MG tablet TAKE 1 TABLET BY MOUTH  TWICE DAILY (Patient taking differently: Take 50 mg by mouth 2 (two) times daily. ) 180 tablet 3  . clobetasol (TEMOVATE) 0.05 % external solution Apply 1 application topically 2 (two) times daily. For seborrheic dermatitis- 7 days maximum (Patient taking differently: Apply 1 application topically 2 (two) times daily as needed (For seborrheic dermatitis). 7 days maximum) 50 mL 0  . moxifloxacin (VIGAMOX) 0.5 % ophthalmic solution Place 1 drop into the right eye 4 (four) times daily.     . Multiple Vitamin (MULTIVITAMIN WITH MINERALS) TABS tablet Take 1 tablet by mouth daily.    . prednisoLONE acetate (PRED FORTE) 1 % ophthalmic suspension Place 1 drop into the right eye 4 (four) times daily.     . prochlorperazine (COMPAZINE) 10 MG tablet Take 1 tablet (10 mg total) by mouth every 6 (six) hours as needed for nausea or vomiting. (Patient not taking: Reported on 01/10/2019) 30 tablet 2  . triamcinolone cream (KENALOG) 0.5 % Apply topically 2 (two) times daily as needed. (Patient taking differently: Apply 1 application topically 2 (two) times daily as needed (skin  irritation/rash). ) 30 g 1  . XARELTO 20 MG TABS tablet TAKE 1 TABLET BY MOUTH  DAILY WITH SUPPER (Patient taking differently: Take 20 mg by mouth daily. ) 90 tablet 3   No current facility-administered medications for this visit.     SURGICAL HISTORY:  Past Surgical History:  Procedure Laterality Date  . CATARACT EXTRACTION W/ INTRAOCULAR LENS IMPLANT     right  . COLONOSCOPY    . RADIOFREQUENCY ABLATION      x2 for fib, on 2nd attempt switched form a flutter ot a fib.   Marland Kitchen ROTATOR CUFF REPAIR Right   . TONSILLECTOMY    . VIDEO BRONCHOSCOPY WITH ENDOBRONCHIAL ULTRASOUND Left 12/30/2018   Procedure: VIDEO BRONCHOSCOPY WITH ENDOBRONCHIAL ULTRASOUND WITH FLUOROSCOPY;  Surgeon: Laurin Coder, MD;  Location: [facility];  Service: Thoracic;  Laterality: Left;  . WISDOM TOOTH EXTRACTION      REVIEW OF SYSTEMS:   Review of Systems  Constitutional: Positive for occasional chills at night (chronic) and decreased appetite. Negative for fatigue, fever and unexpected weight change.  HENT: Negative for mouth sores, nosebleeds, sore throat and trouble swallowing.   Eyes: Negative for eye problems and icterus.  Respiratory: Positive for persistent chronic cough (unchanged). Negative for  hemoptysis, shortness of breath and wheezing.   Cardiovascular: Negative for chest pain and leg swelling.  Gastrointestinal: Positive for mild nausea. Negative for abdominal pain, constipation, diarrhea, and vomiting.  Genitourinary: Negative for bladder incontinence, difficulty urinating, dysuria, frequency and hematuria.   Musculoskeletal: Negative for back pain, gait problem, neck pain and neck stiffness.  Skin: Negative for itching and rash.  Neurological: Negative for dizziness, extremity weakness, gait problem, headaches, light-headedness and seizures.  Hematological: Negative for adenopathy. Does not bruise/bleed easily.  Psychiatric/Behavioral: Negative for confusion, depression and sleep disturbance. The patient is not nervous/anxious.     PHYSICAL EXAMINATION:  There were no vitals taken for this visit.  ECOG PERFORMANCE STATUS: 1 - Symptomatic but completely ambulatory  Physical Exam  Constitutional: Oriented to person, place, and time and well-developed, well-nourished, and in no distress.  HENT:  Head: Normocephalic and atraumatic.  Mouth/Throat: Oropharynx is clear and moist. No oropharyngeal exudate.  Eyes:  Conjunctivae are normal. Right eye exhibits no discharge. Left eye exhibits no discharge. No scleral icterus.  Neck: Normal range of motion. Neck supple.  Cardiovascular: Normal rate, irregular rhythm, normal heart sounds and intact distal pulses.   Pulmonary/Chest: Effort normal and breath sounds normal except decreased breath sounds in RLL. No respiratory distress. No wheezes. No rales.  Abdominal: Soft. Bowel sounds are normal. Exhibits no distension and no mass. There is no tenderness.  Musculoskeletal: Normal range of motion. Exhibits no edema.  Lymphadenopathy:    No cervical adenopathy.  Neurological: Alert and oriented to person, place, and time. Exhibits normal muscle tone. Gait normal. Coordination normal.  Skin: Skin is warm and dry. No rash noted. Not diaphoretic. No erythema. No pallor.  Psychiatric: Mood, memory and judgment normal.  Vitals reviewed.  LABORATORY DATA: Lab Results  Component Value Date   WBC 9.3 01/16/2019   HGB 12.8 (L) 01/16/2019   HCT 38.9 (L) 01/16/2019   MCV 87.0 01/16/2019   PLT 186 01/16/2019      Chemistry      Component Value Date/Time   NA 137 01/16/2019 0822   K 4.3 01/16/2019 0822   CL 105 01/16/2019 0822   CO2 21 (L) 01/16/2019 0822   BUN 11 01/16/2019 0822   CREATININE 0.81 01/16/2019  6546      Component Value Date/Time   CALCIUM 9.0 01/16/2019 0822   ALKPHOS 74 01/16/2019 0822   AST 16 01/16/2019 0822   ALT 13 01/16/2019 0822   BILITOT 0.4 01/16/2019 5035       RADIOGRAPHIC STUDIES:  Mr Jeri Cos WS Contrast  Result Date: 01/20/2019 CLINICAL DATA:  Squamous cell carcinoma lung.  Staging EXAM: MRI HEAD WITHOUT AND WITH CONTRAST TECHNIQUE: Multiplanar, multiecho pulse sequences of the brain and surrounding structures were obtained without and with intravenous contrast. CONTRAST:  22mL GADAVIST GADOBUTROL 1 MMOL/ML IV SOLN COMPARISON:  None. FINDINGS: Brain: Moderate cerebral atrophy. Negative for hydrocephalus. Negative for  infarct, hemorrhage, mass. No edema. Normal enhancement.  No enhancing metastatic disease in the brain. Vascular: Normal arterial flow voids. Skull and upper cervical spine: Negative Sinuses/Orbits: Mild mucosal edema paranasal sinuses. Bilateral cataract surgery Other: None IMPRESSION: Negative for metastatic disease.  No acute abnormality Electronically Signed   By: Franchot Gallo M.D.   On: 01/20/2019 16:32   Dg Chest Port 1 View  Result Date: 12/30/2018 CLINICAL DATA:  Post bronchoscopy EXAM: PORTABLE CHEST 1 VIEW COMPARISON:  11/16/2018 FINDINGS: Right lower lobe opacity again noted. No pneumothorax following bronchoscopy. Heart is normal size. No effusions. IMPRESSION: No pneumothorax following bronchoscopy. Electronically Signed   By: Rolm Baptise M.D.   On: 12/30/2018 15:27   Dg C-arm Bronchoscopy  Result Date: 12/30/2018 C-ARM BRONCHOSCOPY: Fluoroscopy was utilized by the requesting physician.  No radiographic interpretation.     ASSESSMENT/PLAN:  This is a very pleasant 79 year old Caucasian male diagnosed with stage IIIb (T3, N2, M0) non-small cell lung cancer, squamous cell carcinoma.  He presented with a right lower lobe lung mass in addition to subcarinal lymphadenopathy.  He was diagnosed in October 2020.  He is negative for any actionable mutations.  He is currently undergoing concurrent chemoradiation with carboplatin for an AUC of 2 and paclitaxel 45 mg/m.  He is status post 1 cycle.  He tolerated it well without any adverse side effects except for mild nausea and decreased appetite.  The patient was seen with Dr. Julien Nordmann today.  Labs were reviewed.  We recommend that he proceed with cycle #2 today scheduled.  We will see the patient back for a follow-up visit in 2 weeks for evaluation before starting cycle #4.  The patient has poor venous access. Port-a-cath placement was discussed with the patient today. He is interested in a port-a-cath. I have placed the order for  port-a-cath insertion and have sent EMLA cream to his pharmacy.   He will start taking a stool softener for his mild constipation.   He will continue to use his compazine every 6 hours as needed for nausea. He was advised to try to take this prior to eating to see if this helps with his decreased appetite secondary to nausea.  I reviewed signs and symptoms of infection with the patient. He knows to notify the clinic if he experiences any symptoms of infection.   The patient was advised to call immediately if he has any concerning symptoms in the interval. The patient voices understanding of current disease status and treatment options and is in agreement with the current care plan. All questions were answered. The patient knows to call the clinic with any problems, questions or concerns. We can certainly see the patient much sooner if necessary  No orders of the defined types were placed in this encounter.    Echo Propp L Krishauna Schatzman, PA-C 01/23/19  ADDENDUM: Hematology/Oncology Attending: I had a face-to-face encounter with the patient today.  I recommended his care plan.  This is a very pleasant 78 years old white male recently diagnosed with a stage IIIb non-small cell lung cancer, squamous cell carcinoma in a patient who quit smoking many years ago.  He has molecular studies done by [employer] that showed no actionable mutations. The patient is currently undergoing treatment with concurrent chemoradiation with weekly carboplatin for AUC of 2 and paclitaxel 45 mg/M2 status post 1 cycle. He tolerated the first cycle of his treatment well with no concerning complaints except for one episode of nausea and mild fatigue as well as few chills.  The patient is feeling much better today. He would like to have a Port-A-Cath placed for IV access.  We will arrange for the patient to have the Port-A-Cath placed. He will proceed with cycle #2 today as planned. I will see him back for follow-up  visit in 2 weeks for evaluation and management of any adverse effect of his treatment. The patient was advised to call immediately if he has any concerning symptoms in the interval.  Disclaimer: This note was dictated with voice recognition software. Similar sounding words can inadvertently be transcribed and may be missed upon review. Eilleen Kempf, MD 01/23/19

## 2019-01-23 NOTE — Patient Instructions (Signed)
   [facility] Discharge Instructions for Patients Receiving Chemotherapy  Today you received the following chemotherapy agents Taxol and Carboplatin   To help prevent nausea and vomiting after your treatment, we encourage you to take your nausea medication as directed.    If you develop nausea and vomiting that is not controlled by your nausea medication, call the clinic.   BELOW ARE SYMPTOMS THAT SHOULD BE REPORTED IMMEDIATELY:  *FEVER GREATER THAN 100.5 F  *CHILLS WITH OR WITHOUT FEVER  NAUSEA AND VOMITING THAT IS NOT CONTROLLED WITH YOUR NAUSEA MEDICATION  *UNUSUAL SHORTNESS OF BREATH  *UNUSUAL BRUISING OR BLEEDING  TENDERNESS IN MOUTH AND THROAT WITH OR WITHOUT PRESENCE OF ULCERS  *URINARY PROBLEMS  *BOWEL PROBLEMS  UNUSUAL RASH Items with * indicate a potential emergency and should be followed up as soon as possible.  Feel free to call the clinic should you have any questions or concerns. The clinic phone number is [phone].  Please show the CHEMO ALERT CARD at check-in to the Emergency Department and triage nurse.   

## 2019-01-24 ENCOUNTER — Telehealth: Payer: Self-pay | Admitting: Physician Assistant

## 2019-01-24 ENCOUNTER — Ambulatory Visit
Admission: RE | Admit: 2019-01-24 | Discharge: 2019-01-24 | Disposition: A | Discharge status: Home / Self Care | Payer: Medicare Other | Source: Ambulatory Visit | Attending: Radiation Oncology | Admitting: Radiation Oncology

## 2019-01-24 ENCOUNTER — Other Ambulatory Visit: Payer: Self-pay

## 2019-01-24 DIAGNOSIS — C3431 Malignant neoplasm of lower lobe, right bronchus or lung: Secondary | ICD-10-CM | POA: Diagnosis not present

## 2019-01-24 DIAGNOSIS — Z510 Encounter for antineoplastic radiation therapy: Secondary | ICD-10-CM | POA: Diagnosis not present

## 2019-01-24 NOTE — Telephone Encounter (Signed)
Scheduled per los. Called and spoke with patient. Confirmed appts  

## 2019-01-25 ENCOUNTER — Other Ambulatory Visit: Payer: Self-pay

## 2019-01-25 ENCOUNTER — Ambulatory Visit
Admission: RE | Admit: 2019-01-25 | Discharge: 2019-01-25 | Disposition: A | Discharge status: Home / Self Care | Payer: Medicare Other | Source: Ambulatory Visit | Attending: Radiation Oncology | Admitting: Radiation Oncology

## 2019-01-25 DIAGNOSIS — C3431 Malignant neoplasm of lower lobe, right bronchus or lung: Secondary | ICD-10-CM | POA: Diagnosis not present

## 2019-01-25 DIAGNOSIS — Z510 Encounter for antineoplastic radiation therapy: Secondary | ICD-10-CM | POA: Diagnosis not present

## 2019-01-26 ENCOUNTER — Other Ambulatory Visit: Payer: Self-pay

## 2019-01-26 ENCOUNTER — Ambulatory Visit
Admission: RE | Admit: 2019-01-26 | Discharge: 2019-01-26 | Disposition: A | Discharge status: Home / Self Care | Payer: Medicare Other | Source: Ambulatory Visit | Attending: Radiation Oncology | Admitting: Radiation Oncology

## 2019-01-26 DIAGNOSIS — C3431 Malignant neoplasm of lower lobe, right bronchus or lung: Secondary | ICD-10-CM | POA: Diagnosis not present

## 2019-01-26 DIAGNOSIS — Z510 Encounter for antineoplastic radiation therapy: Secondary | ICD-10-CM | POA: Diagnosis not present

## 2019-01-27 ENCOUNTER — Other Ambulatory Visit: Payer: Self-pay

## 2019-01-27 ENCOUNTER — Ambulatory Visit
Admission: RE | Admit: 2019-01-27 | Discharge: 2019-01-27 | Disposition: A | Discharge status: Home / Self Care | Payer: Medicare Other | Source: Ambulatory Visit | Attending: Radiation Oncology | Admitting: Radiation Oncology

## 2019-01-27 DIAGNOSIS — Z510 Encounter for antineoplastic radiation therapy: Secondary | ICD-10-CM | POA: Diagnosis not present

## 2019-01-27 DIAGNOSIS — C3431 Malignant neoplasm of lower lobe, right bronchus or lung: Secondary | ICD-10-CM | POA: Diagnosis not present

## 2019-01-29 ENCOUNTER — Other Ambulatory Visit: Payer: Self-pay | Admitting: Radiology

## 2019-01-30 ENCOUNTER — Other Ambulatory Visit: Payer: Self-pay

## 2019-01-30 ENCOUNTER — Inpatient Hospital Stay: Payer: Medicare Other

## 2019-01-30 ENCOUNTER — Inpatient Hospital Stay: Payer: Medicare Other | Attending: Internal Medicine

## 2019-01-30 ENCOUNTER — Telehealth: Payer: Self-pay | Admitting: Internal Medicine

## 2019-01-30 ENCOUNTER — Ambulatory Visit
Admission: RE | Admit: 2019-01-30 | Discharge: 2019-01-30 | Disposition: A | Discharge status: Home / Self Care | Payer: Medicare Other | Source: Ambulatory Visit | Attending: Radiation Oncology | Admitting: Radiation Oncology

## 2019-01-30 ENCOUNTER — Ambulatory Visit: Payer: Medicare Other

## 2019-01-30 VITALS — BP 103/69 | HR 72 | Temp 98.5°F | Resp 18

## 2019-01-30 DIAGNOSIS — D6959 Other secondary thrombocytopenia: Secondary | ICD-10-CM | POA: Insufficient documentation

## 2019-01-30 DIAGNOSIS — I4891 Unspecified atrial fibrillation: Secondary | ICD-10-CM | POA: Insufficient documentation

## 2019-01-30 DIAGNOSIS — C3491 Malignant neoplasm of unspecified part of right bronchus or lung: Secondary | ICD-10-CM

## 2019-01-30 DIAGNOSIS — Z79899 Other long term (current) drug therapy: Secondary | ICD-10-CM | POA: Insufficient documentation

## 2019-01-30 DIAGNOSIS — R040 Epistaxis: Secondary | ICD-10-CM | POA: Diagnosis not present

## 2019-01-30 DIAGNOSIS — K5900 Constipation, unspecified: Secondary | ICD-10-CM | POA: Diagnosis not present

## 2019-01-30 DIAGNOSIS — I10 Essential (primary) hypertension: Secondary | ICD-10-CM | POA: Diagnosis not present

## 2019-01-30 DIAGNOSIS — C3431 Malignant neoplasm of lower lobe, right bronchus or lung: Secondary | ICD-10-CM | POA: Insufficient documentation

## 2019-01-30 DIAGNOSIS — T451X5A Adverse effect of antineoplastic and immunosuppressive drugs, initial encounter: Secondary | ICD-10-CM | POA: Diagnosis not present

## 2019-01-30 DIAGNOSIS — E785 Hyperlipidemia, unspecified: Secondary | ICD-10-CM | POA: Insufficient documentation

## 2019-01-30 DIAGNOSIS — Z7901 Long term (current) use of anticoagulants: Secondary | ICD-10-CM | POA: Insufficient documentation

## 2019-01-30 DIAGNOSIS — Z510 Encounter for antineoplastic radiation therapy: Secondary | ICD-10-CM | POA: Diagnosis not present

## 2019-01-30 DIAGNOSIS — Z5111 Encounter for antineoplastic chemotherapy: Secondary | ICD-10-CM | POA: Insufficient documentation

## 2019-01-30 DIAGNOSIS — R05 Cough: Secondary | ICD-10-CM | POA: Insufficient documentation

## 2019-01-30 LAB — CMP (CANCER CENTER ONLY)
ALT: 18 U/L (ref 0–44)
AST: 16 U/L (ref 15–41)
Albumin: 2.6 g/dL — ABNORMAL LOW (ref 3.5–5.0)
Alkaline Phosphatase: 82 U/L (ref 38–126)
Anion gap: 9 (ref 5–15)
BUN: 10 mg/dL (ref 8–23)
CO2: 21 mmol/L — ABNORMAL LOW (ref 22–32)
Calcium: 8.4 mg/dL — ABNORMAL LOW (ref 8.9–10.3)
Chloride: 104 mmol/L (ref 98–111)
Creatinine: 0.73 mg/dL (ref 0.61–1.24)
GFR, Est AFR Am: >60 mL/min (ref >60)
GFR, Estimated: >60 mL/min (ref >60)
Glucose, Bld: 120 mg/dL — ABNORMAL HIGH (ref 70–99)
Potassium: 4.2 mmol/L (ref 3.5–5.1)
Sodium: 134 mmol/L — ABNORMAL LOW (ref 135–145)
Total Bilirubin: 0.4 mg/dL (ref 0.3–1.2)
Total Protein: 5.9 g/dL — ABNORMAL LOW (ref 6.5–8.1)

## 2019-01-30 LAB — CBC WITH DIFFERENTIAL (CANCER CENTER ONLY)
Abs Immature Granulocytes: 0.04 10*3/uL (ref 0.00–0.07)
Basophils Absolute: 0 10*3/uL (ref 0.0–0.1)
Basophils Relative: 1 %
Eosinophils Absolute: 0 10*3/uL (ref 0.0–0.5)
Eosinophils Relative: 0 %
HCT: 32.9 % — ABNORMAL LOW (ref 39.0–52.0)
Hemoglobin: 11 g/dL — ABNORMAL LOW (ref 13.0–17.0)
Immature Granulocytes: 1 %
Lymphocytes Relative: 12 %
Lymphs Abs: 0.6 10*3/uL — ABNORMAL LOW (ref 0.7–4.0)
MCH: 29.4 pg (ref 26.0–34.0)
MCHC: 33.4 g/dL (ref 30.0–36.0)
MCV: 88 fL (ref 80.0–100.0)
Monocytes Absolute: 0.5 10*3/uL (ref 0.1–1.0)
Monocytes Relative: 11 %
Neutro Abs: 3.6 10*3/uL (ref 1.7–7.7)
Neutrophils Relative %: 75 %
Platelet Count: 140 10*3/uL — ABNORMAL LOW (ref 150–400)
RBC: 3.74 MIL/uL — ABNORMAL LOW (ref 4.22–5.81)
RDW: 13.4 % (ref 11.5–15.5)
WBC Count: 4.7 10*3/uL (ref 4.0–10.5)
nRBC: 0 % (ref 0.0–0.2)

## 2019-01-30 MED ORDER — SODIUM CHLORIDE 0.9 % IV SOLN
45.0000 mg/m2 | Freq: Once | INTRAVENOUS | Status: AC
  Administered 2019-01-30: 11:00:00 96 mg via INTRAVENOUS
  Filled 2019-01-30: qty 16

## 2019-01-30 MED ORDER — SODIUM CHLORIDE 0.9 % IV SOLN
20.0000 mg | Freq: Once | INTRAVENOUS | Status: AC
  Administered 2019-01-30: 20 mg via INTRAVENOUS
  Filled 2019-01-30: qty 20

## 2019-01-30 MED ORDER — DIPHENHYDRAMINE HCL 50 MG/ML IJ SOLN
50.0000 mg | Freq: Once | INTRAMUSCULAR | Status: AC
  Administered 2019-01-30: 50 mg via INTRAVENOUS

## 2019-01-30 MED ORDER — SODIUM CHLORIDE 0.9 % IV SOLN
Freq: Once | INTRAVENOUS | Status: AC
  Administered 2019-01-30: 09:00:00 via INTRAVENOUS
  Filled 2019-01-30: qty 250

## 2019-01-30 MED ORDER — SODIUM CHLORIDE 0.9 % IV SOLN
205.4000 mg | Freq: Once | INTRAVENOUS | Status: AC
  Administered 2019-01-30: 12:00:00 210 mg via INTRAVENOUS
  Filled 2019-01-30: qty 21

## 2019-01-30 MED ORDER — PALONOSETRON HCL INJECTION 0.25 MG/5ML
0.2500 mg | Freq: Once | INTRAVENOUS | Status: AC
  Administered 2019-01-30: 09:00:00 0.25 mg via INTRAVENOUS

## 2019-01-30 MED ORDER — DIPHENHYDRAMINE HCL 50 MG/ML IJ SOLN
INTRAMUSCULAR | Status: AC
  Filled 2019-01-30: qty 1

## 2019-01-30 MED ORDER — FAMOTIDINE IN NACL 20-0.9 MG/50ML-% IV SOLN
INTRAVENOUS | Status: AC
  Filled 2019-01-30: qty 50

## 2019-01-30 MED ORDER — PALONOSETRON HCL INJECTION 0.25 MG/5ML
INTRAVENOUS | Status: AC
  Filled 2019-01-30: qty 5

## 2019-01-30 MED ORDER — FAMOTIDINE IN NACL 20-0.9 MG/50ML-% IV SOLN
20.0000 mg | Freq: Once | INTRAVENOUS | Status: AC
  Administered 2019-01-30: 20 mg via INTRAVENOUS

## 2019-01-30 NOTE — Patient Instructions (Signed)
[facility] Discharge Instructions for Patients Receiving Chemotherapy  Today you received the following chemotherapy agents Paclitaxel (TAXOL) & Carboplatin (PARAPLATIN).  To help prevent nausea and vomiting after your treatment, we encourage you to take your nausea medication as prescribed.   If you develop nausea and vomiting that is not controlled by your nausea medication, call the clinic.   BELOW ARE SYMPTOMS THAT SHOULD BE REPORTED IMMEDIATELY:  *FEVER GREATER THAN 100.5 F  *CHILLS WITH OR WITHOUT FEVER  NAUSEA AND VOMITING THAT IS NOT CONTROLLED WITH YOUR NAUSEA MEDICATION  *UNUSUAL SHORTNESS OF BREATH  *UNUSUAL BRUISING OR BLEEDING  TENDERNESS IN MOUTH AND THROAT WITH OR WITHOUT PRESENCE OF ULCERS  *URINARY PROBLEMS  *BOWEL PROBLEMS  UNUSUAL RASH Items with * indicate a potential emergency and should be followed up as soon as possible.  Feel free to call the clinic should you have any questions or concerns. The clinic phone number is [phone].  Please show the CHEMO ALERT CARD at check-in to the Emergency Department and triage nurse.  Coronavirus (COVID-19) Are you at risk?  Are you at risk for the Coronavirus (COVID-19)?  To be considered HIGH RISK for Coronavirus (COVID-19), you have to meet the following criteria:  . Traveled to [place], [place], [place], [place] or [place]; or in the [place] to [place], [place], [place], or [place]; and have fever, cough, and shortness of breath within the last 2 weeks of travel OR . Been in close contact with a person diagnosed with COVID-19 within the last 2 weeks and have fever, cough, and shortness of breath . IF YOU DO NOT MEET THESE CRITERIA, YOU ARE CONSIDERED LOW RISK FOR COVID-19.  What to do if you are HIGH RISK for COVID-19?  . If you are having a medical emergency, call 911. . Seek medical care right away. Before you go to a doctor's office, urgent care or emergency department,  call ahead and tell them about your recent travel, contact with someone diagnosed with COVID-19, and your symptoms. You should receive instructions from your physician's office regarding next steps of care.  . When you arrive at healthcare provider, tell the healthcare staff immediately you have returned from visiting [place], [place], [place], [place] or [place]; or traveled in the [place] to [place], [place], [place], or [place]; in the last two weeks or you have been in close contact with a person diagnosed with COVID-19 in the last 2 weeks.   . Tell the health care staff about your symptoms: fever, cough and shortness of breath. . After you have been seen by a medical provider, you will be either: o Tested for (COVID-19) and discharged home on quarantine except to seek medical care if symptoms worsen, and asked to  - Stay home and avoid contact with others until you get your results (4-5 days)  - Avoid travel on public transportation if possible (such as bus, train, or airplane) or o Sent to the Emergency Department by EMS for evaluation, COVID-19 testing, and possible admission depending on your condition and test results.  What to do if you are LOW RISK for COVID-19?  Reduce your risk of any infection by using the same precautions used for avoiding the common cold or flu:  . Wash your hands often with soap and warm water for at least 20 seconds.  If soap and water are not readily available, use an alcohol-based hand sanitizer with at least 60% alcohol.  .   If coughing or sneezing, cover your mouth and nose by coughing or sneezing into the elbow areas of your shirt or coat, into a tissue or into your sleeve (not your hands). . Avoid shaking hands with others and consider head nods or verbal greetings only. . Avoid touching your eyes, nose, or mouth with unwashed hands.  . Avoid close contact with people who are sick. . Avoid places or events with large numbers of people in one  location, like concerts or sporting events. . Carefully consider travel plans you have or are making. . If you are planning any travel outside or inside the [place], visit the CDC's Travelers' Health webpage for the latest health notices. . If you have some symptoms but not all symptoms, continue to monitor at home and seek medical attention if your symptoms worsen. . If you are having a medical emergency, call 911.   ADDITIONAL HEALTHCARE OPTIONS FOR PATIENTS  [facility] / e-Visit: [email or web address]         [facility]: [phone]  [facility]: [phone]                   [facility]: [phone]   

## 2019-01-30 NOTE — Progress Notes (Signed)
Patient expressed displeasure at his appointment duration being changed from 1h 42m to 3hrs without his knowledge and requested that he be informed of these changes in the future. Scheduling notified.

## 2019-01-30 NOTE — Telephone Encounter (Signed)
Called pt per 11/2 sch message - called to confirm appt for next chemo is 3 hrs. Left message for patient .

## 2019-01-31 ENCOUNTER — Encounter (HOSPITAL_COMMUNITY): Payer: Self-pay | Admitting: Interventional Radiology

## 2019-01-31 ENCOUNTER — Ambulatory Visit (HOSPITAL_COMMUNITY)
Admission: RE | Admit: 2019-01-31 | Discharge: 2019-01-31 | Disposition: A | Discharge status: Home / Self Care | Payer: Medicare Other | Source: Ambulatory Visit

## 2019-01-31 ENCOUNTER — Other Ambulatory Visit: Payer: Self-pay

## 2019-01-31 ENCOUNTER — Ambulatory Visit (HOSPITAL_COMMUNITY)
Admission: RE | Admit: 2019-01-31 | Discharge: 2019-01-31 | Disposition: A | Discharge status: Home / Self Care | Payer: Medicare Other | Source: Ambulatory Visit | Attending: Physician Assistant | Admitting: Physician Assistant

## 2019-01-31 ENCOUNTER — Other Ambulatory Visit: Payer: Self-pay | Admitting: Physician Assistant

## 2019-01-31 ENCOUNTER — Ambulatory Visit
Admission: RE | Admit: 2019-01-31 | Discharge: 2019-01-31 | Disposition: A | Discharge status: Home / Self Care | Payer: Medicare Other | Source: Ambulatory Visit | Attending: Radiation Oncology | Admitting: Radiation Oncology

## 2019-01-31 DIAGNOSIS — F329 Major depressive disorder, single episode, unspecified: Secondary | ICD-10-CM | POA: Diagnosis not present

## 2019-01-31 DIAGNOSIS — I10 Essential (primary) hypertension: Secondary | ICD-10-CM | POA: Insufficient documentation

## 2019-01-31 DIAGNOSIS — Z7901 Long term (current) use of anticoagulants: Secondary | ICD-10-CM | POA: Diagnosis not present

## 2019-01-31 DIAGNOSIS — Z87891 Personal history of nicotine dependence: Secondary | ICD-10-CM | POA: Insufficient documentation

## 2019-01-31 DIAGNOSIS — Z5111 Encounter for antineoplastic chemotherapy: Secondary | ICD-10-CM | POA: Diagnosis not present

## 2019-01-31 DIAGNOSIS — Z79899 Other long term (current) drug therapy: Secondary | ICD-10-CM | POA: Insufficient documentation

## 2019-01-31 DIAGNOSIS — E785 Hyperlipidemia, unspecified: Secondary | ICD-10-CM | POA: Diagnosis not present

## 2019-01-31 DIAGNOSIS — Z510 Encounter for antineoplastic radiation therapy: Secondary | ICD-10-CM | POA: Diagnosis not present

## 2019-01-31 DIAGNOSIS — C3491 Malignant neoplasm of unspecified part of right bronchus or lung: Secondary | ICD-10-CM

## 2019-01-31 DIAGNOSIS — C3431 Malignant neoplasm of lower lobe, right bronchus or lung: Secondary | ICD-10-CM | POA: Diagnosis not present

## 2019-01-31 DIAGNOSIS — C3490 Malignant neoplasm of unspecified part of unspecified bronchus or lung: Secondary | ICD-10-CM | POA: Diagnosis not present

## 2019-01-31 DIAGNOSIS — I4891 Unspecified atrial fibrillation: Secondary | ICD-10-CM | POA: Insufficient documentation

## 2019-01-31 HISTORY — PX: IR IMAGING GUIDED PORT INSERTION: IMG5740

## 2019-01-31 LAB — CBC WITH DIFFERENTIAL/PLATELET
Abs Immature Granulocytes: 0.07 10*3/uL (ref 0.00–0.07)
Basophils Absolute: 0 10*3/uL (ref 0.0–0.1)
Basophils Relative: 0 %
Eosinophils Absolute: 0 10*3/uL (ref 0.0–0.5)
Eosinophils Relative: 0 %
HCT: 36.3 % — ABNORMAL LOW (ref 39.0–52.0)
Hemoglobin: 11.5 g/dL — ABNORMAL LOW (ref 13.0–17.0)
Immature Granulocytes: 1 %
Lymphocytes Relative: 4 %
Lymphs Abs: 0.3 10*3/uL — ABNORMAL LOW (ref 0.7–4.0)
MCH: 29.6 pg (ref 26.0–34.0)
MCHC: 31.7 g/dL (ref 30.0–36.0)
MCV: 93.6 fL (ref 80.0–100.0)
Monocytes Absolute: 0.4 10*3/uL (ref 0.1–1.0)
Monocytes Relative: 5 %
Neutro Abs: 7.6 10*3/uL (ref 1.7–7.7)
Neutrophils Relative %: 90 %
Platelets: 185 10*3/uL (ref 150–400)
RBC: 3.88 MIL/uL — ABNORMAL LOW (ref 4.22–5.81)
RDW: 13.6 % (ref 11.5–15.5)
WBC: 8.4 10*3/uL (ref 4.0–10.5)
nRBC: 0 % (ref 0.0–0.2)

## 2019-01-31 LAB — PROTIME-INR
INR: 1 (ref 0.8–1.2)
Prothrombin Time: 13.2 seconds (ref 11.4–15.2)

## 2019-01-31 MED ORDER — HEPARIN SOD (PORK) LOCK FLUSH 100 UNIT/ML IV SOLN
INTRAVENOUS | Status: AC
  Filled 2019-01-31: qty 5

## 2019-01-31 MED ORDER — MIDAZOLAM HCL 2 MG/2ML IJ SOLN
INTRAMUSCULAR | Status: AC
  Filled 2019-01-31: qty 4

## 2019-01-31 MED ORDER — LIDOCAINE-EPINEPHRINE 1 %-1:100000 IJ SOLN
INTRAMUSCULAR | Status: AC | PRN
  Administered 2019-01-31 (×2): 10 mL

## 2019-01-31 MED ORDER — MIDAZOLAM HCL 2 MG/2ML IJ SOLN
INTRAMUSCULAR | Status: AC | PRN
  Administered 2019-01-31 (×2): 1 mg via INTRAVENOUS

## 2019-01-31 MED ORDER — HEPARIN SOD (PORK) LOCK FLUSH 100 UNIT/ML IV SOLN
INTRAVENOUS | Status: AC | PRN
  Administered 2019-01-31: 500 [IU] via INTRAVENOUS

## 2019-01-31 MED ORDER — FENTANYL CITRATE (PF) 100 MCG/2ML IJ SOLN
INTRAMUSCULAR | Status: AC
  Filled 2019-01-31: qty 2

## 2019-01-31 MED ORDER — CEFAZOLIN SODIUM-DEXTROSE 2-4 GM/100ML-% IV SOLN
2.0000 g | INTRAVENOUS | Status: AC
  Administered 2019-01-31: 15:00:00 2 g via INTRAVENOUS

## 2019-01-31 MED ORDER — SODIUM CHLORIDE 0.9 % IV SOLN
INTRAVENOUS | Status: DC
  Administered 2019-01-31: 14:00:00 via INTRAVENOUS

## 2019-01-31 MED ORDER — FENTANYL CITRATE (PF) 100 MCG/2ML IJ SOLN
INTRAMUSCULAR | Status: AC | PRN
  Administered 2019-01-31: 50 ug via INTRAVENOUS

## 2019-01-31 MED ORDER — CEFAZOLIN SODIUM-DEXTROSE 2-4 GM/100ML-% IV SOLN
INTRAVENOUS | Status: AC
  Administered 2019-01-31: 2 g via INTRAVENOUS
  Filled 2019-01-31: qty 100

## 2019-01-31 MED ORDER — LIDOCAINE-EPINEPHRINE 1 %-1:100000 IJ SOLN
INTRAMUSCULAR | Status: AC
  Filled 2019-01-31: qty 1

## 2019-01-31 NOTE — Procedures (Signed)
Pre Procedure Dx: Lung cancer Post Procedural Dx: Same  Successful placement of right IJ approach port-a-cath with tip at the superior caval atrial junction. The catheter is ready for immediate use.  Estimated Blood Loss: Minimal  Complications: None immediate.  Jay Catina Nuss, MD Pager #: [phone]   

## 2019-01-31 NOTE — Consult Note (Signed)
Chief Complaint: Patient was seen in consultation today for Port-A-Cath placement  Referring Physician(s): [name],M  Supervising Physician: Sandi Mariscal  Patient Status: [facility] - Out-pt  History of Present Illness: Martin Bailey is a 78 y.o. male with history of newly diagnosed squamous cell carcinoma of the right lung as well as poor venous access who presents today for Port-A-Cath placement for additional chemotherapy.  Past Medical History:  Diagnosis Date  . Cataract    left  . COLONIC POLYPS, HX OF 04/05/2007   ONE ADENOMATOUS POLYP AND ONE HYPERPLASTIC POLYP  . DEPRESSION 03/04/2007  . DERMATITIS, SEBORRHEIC 07/19/2007  . FIBRILLATION, ATRIAL 03/04/2007  . Hearing aid worn    B/L  . HYPERLIPIDEMIA 03/04/2007  . HYPERTENSION 03/04/2007  . Kidney stones   . Lung cancer ([place])    non small cell lung ca  . Lung mass    mediastinal adenopathy    Past Surgical History:  Procedure Laterality Date  . CATARACT EXTRACTION W/ INTRAOCULAR LENS IMPLANT     right  . COLONOSCOPY    . RADIOFREQUENCY ABLATION     x2 for fib, on 2nd attempt switched form a flutter ot a fib.   Marland Kitchen ROTATOR CUFF REPAIR Right   . TONSILLECTOMY    . VIDEO BRONCHOSCOPY WITH ENDOBRONCHIAL ULTRASOUND Left 12/30/2018   Procedure: VIDEO BRONCHOSCOPY WITH ENDOBRONCHIAL ULTRASOUND WITH FLUOROSCOPY;  Surgeon: Laurin Coder, MD;  Location: [place];  Service: Thoracic;  Laterality: Left;  . WISDOM TOOTH EXTRACTION      Allergies: Patient has no known allergies.  Medications: Prior to Admission medications   Medication Sig Start Date End Date Taking? Authorizing Provider  amLODipine (NORVASC) 5 MG tablet TAKE 1 TABLET BY MOUTH  DAILY Patient taking differently: Take 5 mg by mouth daily.  07/15/18  Yes [name], MD  atenolol (TENORMIN) 50 MG tablet TAKE 1 TABLET BY MOUTH  TWICE DAILY Patient taking differently: Take 50 mg by mouth 2 (two) times daily.  12/14/18  Yes [name], MD  clobetasol  (TEMOVATE) 0.05 % external solution Apply 1 application topically 2 (two) times daily. For seborrheic dermatitis- 7 days maximum Patient taking differently: Apply 1 application topically 2 (two) times daily as needed (For seborrheic dermatitis). 7 days maximum 02/10/18  Yes [name], MD  moxifloxacin (VIGAMOX) 0.5 % ophthalmic solution Place 1 drop into the right eye 4 (four) times daily.  12/12/18  Yes [provider]  Multiple Vitamin (MULTIVITAMIN WITH MINERALS) TABS tablet Take 1 tablet by mouth daily.   Yes [provider]  prednisoLONE acetate (PRED FORTE) 1 % ophthalmic suspension Place 1 drop into the right eye 4 (four) times daily.  12/12/18  Yes [provider]  triamcinolone cream (KENALOG) 0.5 % Apply topically 2 (two) times daily as needed. Patient taking differently: Apply 1 application topically 2 (two) times daily as needed (skin irritation/rash).  08/13/16  Yes [name], MD  lidocaine-prilocaine (EMLA) cream Apply 1 application topically as needed. 01/23/19   Heilingoetter, Cassandra L, PA-C  prochlorperazine (COMPAZINE) 10 MG tablet Take 1 tablet (10 mg total) by mouth every 6 (six) hours as needed for nausea or vomiting. Patient not taking: Reported on 01/10/2019 01/06/19   Heilingoetter, Cassandra L, PA-C  XARELTO 20 MG TABS tablet TAKE 1 TABLET BY MOUTH  DAILY WITH SUPPER Patient taking differently: Take 20 mg by mouth daily.  05/16/18   [name], MD     Family History  Problem  Relation Age of Onset  . Osteoporosis Mother   . Dementia Mother   . Lung cancer Father        former smoker  . Colon cancer Neg Hx   . Stomach cancer Neg Hx   . Esophageal cancer Neg Hx   . Rectal cancer Neg Hx     Social History   Socioeconomic History  . Marital status: Widowed    Spouse name: Not on file  . Number of children: 1  . Years of education: Not on file  . Highest education level: Not on file  Occupational History  .  Occupation: retired  Scientific laboratory technician  . Financial resource strain: Not on file  . Food insecurity    Worry: Not on file    Inability: Not on file  . Transportation needs    Medical: Not on file    Non-medical: Not on file  Tobacco Use  . Smoking status: Former Smoker    Packs/day: 1.50    Years: 10.00    Pack years: 15.00    Types: Cigarettes    Quit date: 07/08/1977    Years since quitting: 41.5  . Smokeless tobacco: Never Used  Substance and Sexual Activity  . Alcohol use: Yes    Alcohol/week: 42.0 standard drinks    Types: 42 Standard drinks or equivalent per week    Comment: 4 scotch's a day  . Drug use: No  . Sexual activity: Not on file  Lifestyle  . Physical activity    Days per week: Not on file    Minutes per session: Not on file  . Stress: Not on file  Relationships  . Social Herbalist on phone: Not on file    Gets together: Not on file    Attends religious service: Not on file    Active member of club or organization: Not on file    Attends meetings of clubs or organizations: Not on file    Relationship status: Not on file  Other Topics Concern  . Not on file  Social History Narrative   Widower-wife died 70. 1 daughter. 2 grandkids. 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       Hobbies: golf, bridge, computer, sports, woodworking      Review of Systems currently denies fever, headache, chest pain, dyspnea, cough, abdominal/back pain, nausea, vomiting or bleeding.  Vital Signs: BP 109/62 (BP Location: Left Arm)   Pulse 79   Temp (!) 97.4 F (36.3 C) (Oral)   Resp 18   SpO2 100%   Physical Exam awake, alert.  Chest with slightly diminished breath sounds right base, left clear.  Heart with irregularly irregular rhythm.  Abdomen soft, positive bowel sounds, nontender.  Trace pretibial edema bilaterally.  Imaging: Mr  Jeri Cos VF Contrast  Result Date: 01/20/2019 CLINICAL DATA:  Squamous cell carcinoma lung.  Staging EXAM: MRI HEAD WITHOUT AND WITH CONTRAST TECHNIQUE: Multiplanar, multiecho pulse sequences of the brain and surrounding structures were obtained without and with intravenous contrast. CONTRAST:  34mL GADAVIST GADOBUTROL 1 MMOL/ML IV SOLN COMPARISON:  None. FINDINGS: Brain: Moderate cerebral atrophy. Negative for hydrocephalus. Negative for infarct, hemorrhage, mass. No edema. Normal enhancement.  No enhancing metastatic disease in the brain. Vascular: Normal arterial flow voids. Skull and upper cervical spine: Negative Sinuses/Orbits: Mild mucosal edema paranasal sinuses. Bilateral cataract surgery Other:  None IMPRESSION: Negative for metastatic disease.  No acute abnormality Electronically Signed   By: Franchot Gallo M.D.   On: 01/20/2019 16:32    Labs:  CBC: Recent Labs    01/16/19 0822 01/23/19 1150 01/30/19 0825 01/31/19 1311  WBC 9.3 11.3* 4.7 8.4  HGB 12.8* 11.4* 11.0* 11.5*  HCT 38.9* 34.9* 32.9* 36.3*  PLT 186 172 140* 185    COAGS: Recent Labs    12/30/18 0844 01/31/19 1311  INR 1.1 1.0    BMP: Recent Labs    01/06/19 1012 01/16/19 0822 01/23/19 1150 01/30/19 0825  NA 136 137 133* 134*  K 4.2 4.3 4.8 4.2  CL 104 105 101 104  CO2 23 21* 23 21*  GLUCOSE 143* 124* 98 120*  BUN 17 11 15 10   CALCIUM 8.9 9.0 8.8* 8.4*  CREATININE 0.98 0.81 0.79 0.73  GFRNONAA >60 >60 >60 >60  GFRAA >60 >60 >60 >60    LIVER FUNCTION TESTS: Recent Labs    01/06/19 1012 01/16/19 0822 01/23/19 1150 01/30/19 0825  BILITOT 0.7 0.4 0.7 0.4  AST 16 16 15 16   ALT 15 13 16 18   ALKPHOS 80 74 79 82  PROT 7.2 7.2 6.5 5.9*  ALBUMIN 3.2* 3.2* 2.8* 2.6*    TUMOR MARKERS: No results for input(s): AFPTM, CEA, CA199, CHROMGRNA in the last 8760 hours.  Assessment and Plan: 78 y.o. male with history of newly diagnosed squamous cell carcinoma of the right lung as well as poor venous  access who presents today for Port-A-Cath placement for additional chemotherapy.Risks and benefits of image guided port-a-catheter placement was discussed with the patient including, but not limited to bleeding, infection, pneumothorax, or fibrin sheath development and need for additional procedures.  All of the patient's questions were answered, patient is agreeable to proceed. Consent signed and in chart.     Thank you for this interesting consult.  I greatly enjoyed meeting Martin Bailey and look forward to participating in their care.  A copy of this report was sent to the requesting provider on this date.  Electronically Signed: D. Rowe Robert, PA-C 01/31/2019, 2:14 PM   I spent a total of 25 minutes   in face to face in clinical consultation, greater than 50% of which was counseling/coordinating care for Port-A-Cath placement

## 2019-01-31 NOTE — Discharge Instructions (Addendum)
Please call IR clinic at [phone] or after hours and ask for IR radiologist on call [phone] with any questions concerning your port placement.  DO NOT use EMLA cream for 2 weeks after port placement as this cream will remove surgical glue on your incision.  You may remove your dressing and shower tomorrow.  Implanted Port Insertion, Care After This sheet gives you information about how to care for yourself after your procedure. Your health care provider may also give you more specific instructions. If you have problems or questions, contact your health care provider. What can I expect after the procedure? After the procedure, it is common to have:  Discomfort at the port insertion site.  Bruising on the skin over the port. This should improve over 3-4 days. Follow these instructions at home: [facility] care  After your port is placed, you will get a manufacturer's information card. The card has information about your port. Keep this card with you at all times.  Take care of the port as told by your health care provider. Ask your health care provider if you or a family member can get training for taking care of the port at home. A home health care nurse may also take care of the port.  Make sure to remember what type of port you have. Incision care      Follow instructions from your health care provider about how to take care of your port insertion site. Make sure you: ? Wash your hands with soap and water before and after you change your bandage (dressing). If soap and water are not available, use hand sanitizer. ? Change your dressing as told by your health care provider. ? Leave stitches (sutures), skin glue, or adhesive strips in place. These skin closures may need to stay in place for 2 weeks or longer. If adhesive strip edges start to loosen and curl up, you may trim the loose edges. Do not remove adhesive strips completely unless your health care provider tells you to do  that.  Check your port insertion site every day for signs of infection. Check for: ? Redness, swelling, or pain. ? Fluid or blood. ? Warmth. ? Pus or a bad smell. Activity  Return to your normal activities as told by your health care provider. Ask your health care provider what activities are safe for you.  Do not lift anything that is heavier than 10 lb (4.5 kg), or the limit that you are told, until your health care provider says that it is safe. General instructions  Take over-the-counter and prescription medicines only as told by your health care provider.  Do not take baths, swim, or use a hot tub until your health care provider approves. Ask your health care provider if you may take showers. You may only be allowed to take sponge baths.  Do not drive for 24 hours if you were given a sedative during your procedure.  Wear a medical alert bracelet in case of an emergency. This will tell any health care providers that you have a port.  Keep all follow-up visits as told by your health care provider. This is important. Contact a health care provider if:  You cannot flush your port with saline as directed, or you cannot draw blood from the port.  You have a fever or chills.  You have redness, swelling, or pain around your port insertion site.  You have fluid or blood coming from your port insertion site.  Your port insertion site  feels warm to the touch.  You have pus or a bad smell coming from the port insertion site. Get help right away if:  You have chest pain or shortness of breath.  You have bleeding from your port that you cannot control. Summary  Take care of the port as told by your health care provider. Keep the manufacturer's information card with you at all times.  Change your dressing as told by your health care provider.  Contact a health care provider if you have a fever or chills or if you have redness, swelling, or pain around your port insertion  site.  Keep all follow-up visits as told by your health care provider. This information is not intended to replace advice given to you by your health care provider. Make sure you discuss any questions you have with your health care provider. Document Released: 01/04/2013 Document Revised: 10/12/2017 Document Reviewed: 10/12/2017 Elsevier Patient Education  [place].   Moderate Conscious Sedation, Adult, Care After These instructions provide you with information about caring for yourself after your procedure. Your health care provider may also give you more specific instructions. Your treatment has been planned according to current medical practices, but problems sometimes occur. Call your health care provider if you have any problems or questions after your procedure. What can I expect after the procedure? After your procedure, it is common:  To feel sleepy for several hours.  To feel clumsy and have poor balance for several hours.  To have poor judgment for several hours.  To vomit if you eat too soon. Follow these instructions at home: For at least 24 hours after the procedure:   Do not: ? Participate in activities where you could fall or become injured. ? Drive. ? Use heavy machinery. ? Drink alcohol. ? Take sleeping pills or medicines that cause drowsiness. ? Make important decisions or sign legal documents. ? Take care of children on your own.  Rest. Eating and drinking  Follow the diet recommended by your health care provider.  If you vomit: ? Drink water, juice, or soup when you can drink without vomiting. ? Make sure you have little or no nausea before eating solid foods. General instructions  Have a responsible adult stay with you until you are awake and alert.  Take over-the-counter and prescription medicines only as told by your health care provider.  If you smoke, do not smoke without supervision.  Keep all follow-up visits as told by your health  care provider. This is important. Contact a health care provider if:  You keep feeling nauseous or you keep vomiting.  You feel light-headed.  You develop a rash.  You have a fever. Get help right away if:  You have trouble breathing. This information is not intended to replace advice given to you by your health care provider. Make sure you discuss any questions you have with your health care provider. Document Released: 01/04/2013 Document Revised: 02/26/2017 Document Reviewed: 07/06/2015 Elsevier Patient Education  2020 [employer].

## 2019-02-01 ENCOUNTER — Ambulatory Visit
Admission: RE | Admit: 2019-02-01 | Discharge: 2019-02-01 | Disposition: A | Discharge status: Home / Self Care | Payer: Medicare Other | Source: Ambulatory Visit | Attending: Radiation Oncology | Admitting: Radiation Oncology

## 2019-02-01 ENCOUNTER — Other Ambulatory Visit: Payer: Self-pay

## 2019-02-01 DIAGNOSIS — C3431 Malignant neoplasm of lower lobe, right bronchus or lung: Secondary | ICD-10-CM | POA: Diagnosis not present

## 2019-02-01 DIAGNOSIS — Z510 Encounter for antineoplastic radiation therapy: Secondary | ICD-10-CM | POA: Diagnosis not present

## 2019-02-02 ENCOUNTER — Ambulatory Visit
Admission: RE | Admit: 2019-02-02 | Discharge: 2019-02-02 | Disposition: A | Discharge status: Home / Self Care | Payer: Medicare Other | Source: Ambulatory Visit | Attending: Radiation Oncology | Admitting: Radiation Oncology

## 2019-02-02 ENCOUNTER — Other Ambulatory Visit: Payer: Self-pay

## 2019-02-02 DIAGNOSIS — C3431 Malignant neoplasm of lower lobe, right bronchus or lung: Secondary | ICD-10-CM | POA: Diagnosis not present

## 2019-02-02 DIAGNOSIS — Z510 Encounter for antineoplastic radiation therapy: Secondary | ICD-10-CM | POA: Diagnosis not present

## 2019-02-03 ENCOUNTER — Ambulatory Visit
Admission: RE | Admit: 2019-02-03 | Discharge: 2019-02-03 | Disposition: A | Discharge status: Home / Self Care | Payer: Medicare Other | Source: Ambulatory Visit | Attending: Radiation Oncology | Admitting: Radiation Oncology

## 2019-02-03 ENCOUNTER — Other Ambulatory Visit: Payer: Self-pay

## 2019-02-03 DIAGNOSIS — C3431 Malignant neoplasm of lower lobe, right bronchus or lung: Secondary | ICD-10-CM | POA: Diagnosis not present

## 2019-02-03 DIAGNOSIS — Z510 Encounter for antineoplastic radiation therapy: Secondary | ICD-10-CM | POA: Diagnosis not present

## 2019-02-06 ENCOUNTER — Inpatient Hospital Stay (HOSPITAL_BASED_OUTPATIENT_CLINIC_OR_DEPARTMENT_OTHER): Payer: Medicare Other | Admitting: Physician Assistant

## 2019-02-06 ENCOUNTER — Ambulatory Visit
Admission: RE | Admit: 2019-02-06 | Discharge: 2019-02-06 | Disposition: A | Discharge status: Home / Self Care | Payer: Medicare Other | Source: Ambulatory Visit | Attending: Radiation Oncology | Admitting: Radiation Oncology

## 2019-02-06 ENCOUNTER — Inpatient Hospital Stay: Payer: Medicare Other

## 2019-02-06 ENCOUNTER — Other Ambulatory Visit: Payer: Self-pay

## 2019-02-06 ENCOUNTER — Telehealth: Payer: Self-pay | Admitting: Internal Medicine

## 2019-02-06 VITALS — BP 103/73 | HR 77 | Temp 98.6°F | Resp 18 | Ht 70.0 in | Wt 189.7 lb

## 2019-02-06 DIAGNOSIS — Z95828 Presence of other vascular implants and grafts: Secondary | ICD-10-CM

## 2019-02-06 DIAGNOSIS — T451X5A Adverse effect of antineoplastic and immunosuppressive drugs, initial encounter: Secondary | ICD-10-CM | POA: Diagnosis not present

## 2019-02-06 DIAGNOSIS — D6959 Other secondary thrombocytopenia: Secondary | ICD-10-CM | POA: Diagnosis not present

## 2019-02-06 DIAGNOSIS — E785 Hyperlipidemia, unspecified: Secondary | ICD-10-CM | POA: Diagnosis not present

## 2019-02-06 DIAGNOSIS — K5900 Constipation, unspecified: Secondary | ICD-10-CM | POA: Diagnosis not present

## 2019-02-06 DIAGNOSIS — I4891 Unspecified atrial fibrillation: Secondary | ICD-10-CM | POA: Diagnosis not present

## 2019-02-06 DIAGNOSIS — R05 Cough: Secondary | ICD-10-CM | POA: Diagnosis not present

## 2019-02-06 DIAGNOSIS — I10 Essential (primary) hypertension: Secondary | ICD-10-CM | POA: Diagnosis not present

## 2019-02-06 DIAGNOSIS — Z510 Encounter for antineoplastic radiation therapy: Secondary | ICD-10-CM | POA: Diagnosis not present

## 2019-02-06 DIAGNOSIS — C3491 Malignant neoplasm of unspecified part of right bronchus or lung: Secondary | ICD-10-CM

## 2019-02-06 DIAGNOSIS — Z5111 Encounter for antineoplastic chemotherapy: Secondary | ICD-10-CM | POA: Diagnosis not present

## 2019-02-06 DIAGNOSIS — Z7901 Long term (current) use of anticoagulants: Secondary | ICD-10-CM | POA: Diagnosis not present

## 2019-02-06 DIAGNOSIS — R040 Epistaxis: Secondary | ICD-10-CM | POA: Diagnosis not present

## 2019-02-06 DIAGNOSIS — Z79899 Other long term (current) drug therapy: Secondary | ICD-10-CM | POA: Diagnosis not present

## 2019-02-06 DIAGNOSIS — C3431 Malignant neoplasm of lower lobe, right bronchus or lung: Secondary | ICD-10-CM | POA: Diagnosis not present

## 2019-02-06 LAB — CMP (CANCER CENTER ONLY)
ALT: 18 U/L (ref 0–44)
AST: 17 U/L (ref 15–41)
Albumin: 3 g/dL — ABNORMAL LOW (ref 3.5–5.0)
Alkaline Phosphatase: 68 U/L (ref 38–126)
Anion gap: 8 (ref 5–15)
BUN: 9 mg/dL (ref 8–23)
CO2: 25 mmol/L (ref 22–32)
Calcium: 8.2 mg/dL — ABNORMAL LOW (ref 8.9–10.3)
Chloride: 104 mmol/L (ref 98–111)
Creatinine: 0.77 mg/dL (ref 0.61–1.24)
GFR, Est AFR Am: >60 mL/min (ref >60)
GFR, Estimated: >60 mL/min (ref >60)
Glucose, Bld: 126 mg/dL — ABNORMAL HIGH (ref 70–99)
Potassium: 4.3 mmol/L (ref 3.5–5.1)
Sodium: 137 mmol/L (ref 135–145)
Total Bilirubin: 0.3 mg/dL (ref 0.3–1.2)
Total Protein: 5.8 g/dL — ABNORMAL LOW (ref 6.5–8.1)

## 2019-02-06 LAB — CBC WITH DIFFERENTIAL (CANCER CENTER ONLY)
Abs Immature Granulocytes: 0.02 10*3/uL (ref 0.00–0.07)
Basophils Absolute: 0 10*3/uL (ref 0.0–0.1)
Basophils Relative: 0 %
Eosinophils Absolute: 0 10*3/uL (ref 0.0–0.5)
Eosinophils Relative: 0 %
HCT: 33.5 % — ABNORMAL LOW (ref 39.0–52.0)
Hemoglobin: 11 g/dL — ABNORMAL LOW (ref 13.0–17.0)
Immature Granulocytes: 1 %
Lymphocytes Relative: 25 %
Lymphs Abs: 0.6 10*3/uL — ABNORMAL LOW (ref 0.7–4.0)
MCH: 29.7 pg (ref 26.0–34.0)
MCHC: 32.8 g/dL (ref 30.0–36.0)
MCV: 90.5 fL (ref 80.0–100.0)
Monocytes Absolute: 0.3 10*3/uL (ref 0.1–1.0)
Monocytes Relative: 12 %
Neutro Abs: 1.5 10*3/uL — ABNORMAL LOW (ref 1.7–7.7)
Neutrophils Relative %: 62 %
Platelet Count: 109 10*3/uL — ABNORMAL LOW (ref 150–400)
RBC: 3.7 MIL/uL — ABNORMAL LOW (ref 4.22–5.81)
RDW: 14.3 % (ref 11.5–15.5)
WBC Count: 2.4 10*3/uL — ABNORMAL LOW (ref 4.0–10.5)
nRBC: 0 % (ref 0.0–0.2)

## 2019-02-06 MED ORDER — DIPHENHYDRAMINE HCL 50 MG/ML IJ SOLN
INTRAMUSCULAR | Status: AC
  Filled 2019-02-06: qty 1

## 2019-02-06 MED ORDER — DIPHENHYDRAMINE HCL 50 MG/ML IJ SOLN
50.0000 mg | Freq: Once | INTRAMUSCULAR | Status: AC
  Administered 2019-02-06: 50 mg via INTRAVENOUS

## 2019-02-06 MED ORDER — SODIUM CHLORIDE 0.9% FLUSH
10.0000 mL | INTRAVENOUS | Status: DC | PRN
  Administered 2019-02-06: 10 mL via INTRAVENOUS
  Filled 2019-02-06: qty 10

## 2019-02-06 MED ORDER — SODIUM CHLORIDE 0.9 % IV SOLN
45.0000 mg/m2 | Freq: Once | INTRAVENOUS | Status: AC
  Administered 2019-02-06: 96 mg via INTRAVENOUS
  Filled 2019-02-06: qty 16

## 2019-02-06 MED ORDER — HEPARIN SOD (PORK) LOCK FLUSH 100 UNIT/ML IV SOLN
500.0000 [IU] | Freq: Once | INTRAVENOUS | Status: AC | PRN
  Administered 2019-02-06: 500 [IU]
  Filled 2019-02-06: qty 5

## 2019-02-06 MED ORDER — SODIUM CHLORIDE 0.9 % IV SOLN
Freq: Once | INTRAVENOUS | Status: AC
  Administered 2019-02-06: 11:00:00 via INTRAVENOUS
  Filled 2019-02-06: qty 250

## 2019-02-06 MED ORDER — PALONOSETRON HCL INJECTION 0.25 MG/5ML
INTRAVENOUS | Status: AC
  Filled 2019-02-06: qty 5

## 2019-02-06 MED ORDER — FAMOTIDINE IN NACL 20-0.9 MG/50ML-% IV SOLN
INTRAVENOUS | Status: AC
  Filled 2019-02-06: qty 50

## 2019-02-06 MED ORDER — SODIUM CHLORIDE 0.9 % IV SOLN
20.0000 mg | Freq: Once | INTRAVENOUS | Status: AC
  Administered 2019-02-06: 20 mg via INTRAVENOUS
  Filled 2019-02-06: qty 20

## 2019-02-06 MED ORDER — FAMOTIDINE IN NACL 20-0.9 MG/50ML-% IV SOLN
20.0000 mg | Freq: Once | INTRAVENOUS | Status: AC
  Administered 2019-02-06: 20 mg via INTRAVENOUS

## 2019-02-06 MED ORDER — SODIUM CHLORIDE 0.9 % IV SOLN
210.0000 mg | Freq: Once | INTRAVENOUS | Status: AC
  Administered 2019-02-06: 210 mg via INTRAVENOUS
  Filled 2019-02-06: qty 21

## 2019-02-06 MED ORDER — SODIUM CHLORIDE 0.9% FLUSH
10.0000 mL | INTRAVENOUS | Status: DC | PRN
  Administered 2019-02-06: 10 mL
  Filled 2019-02-06: qty 10

## 2019-02-06 MED ORDER — PALONOSETRON HCL INJECTION 0.25 MG/5ML
0.2500 mg | Freq: Once | INTRAVENOUS | Status: AC
  Administered 2019-02-06: 0.25 mg via INTRAVENOUS

## 2019-02-06 NOTE — Patient Instructions (Signed)

## 2019-02-06 NOTE — Patient Instructions (Signed)
[facility] Discharge Instructions for Patients Receiving Chemotherapy  Today you received the following chemotherapy agents Paclitaxel (TAXOL) & Carboplatin (PARAPLATIN).  To help prevent nausea and vomiting after your treatment, we encourage you to take your nausea medication as prescribed.   If you develop nausea and vomiting that is not controlled by your nausea medication, call the clinic.   BELOW ARE SYMPTOMS THAT SHOULD BE REPORTED IMMEDIATELY:  *FEVER GREATER THAN 100.5 F  *CHILLS WITH OR WITHOUT FEVER  NAUSEA AND VOMITING THAT IS NOT CONTROLLED WITH YOUR NAUSEA MEDICATION  *UNUSUAL SHORTNESS OF BREATH  *UNUSUAL BRUISING OR BLEEDING  TENDERNESS IN MOUTH AND THROAT WITH OR WITHOUT PRESENCE OF ULCERS  *URINARY PROBLEMS  *BOWEL PROBLEMS  UNUSUAL RASH Items with * indicate a potential emergency and should be followed up as soon as possible.  Feel free to call the clinic should you have any questions or concerns. The clinic phone number is [phone].  Please show the CHEMO ALERT CARD at check-in to the Emergency Department and triage nurse.  Coronavirus (COVID-19) Are you at risk?  Are you at risk for the Coronavirus (COVID-19)?  To be considered HIGH RISK for Coronavirus (COVID-19), you have to meet the following criteria:  . Traveled to [place], [place], [place], [place] or [place]; or in the [place] to [place], [place], [place], or [place]; and have fever, cough, and shortness of breath within the last 2 weeks of travel OR . Been in close contact with a person diagnosed with COVID-19 within the last 2 weeks and have fever, cough, and shortness of breath . IF YOU DO NOT MEET THESE CRITERIA, YOU ARE CONSIDERED LOW RISK FOR COVID-19.  What to do if you are HIGH RISK for COVID-19?  . If you are having a medical emergency, call 911. . Seek medical care right away. Before you go to a doctor's office, urgent care or emergency department,  call ahead and tell them about your recent travel, contact with someone diagnosed with COVID-19, and your symptoms. You should receive instructions from your physician's office regarding next steps of care.  . When you arrive at healthcare provider, tell the healthcare staff immediately you have returned from visiting [place], [place], [place], [place] or [place]; or traveled in the [place] to [place], [place], [place], or [place]; in the last two weeks or you have been in close contact with a person diagnosed with COVID-19 in the last 2 weeks.   . Tell the health care staff about your symptoms: fever, cough and shortness of breath. . After you have been seen by a medical provider, you will be either: o Tested for (COVID-19) and discharged home on quarantine except to seek medical care if symptoms worsen, and asked to  - Stay home and avoid contact with others until you get your results (4-5 days)  - Avoid travel on public transportation if possible (such as bus, train, or airplane) or o Sent to the Emergency Department by EMS for evaluation, COVID-19 testing, and possible admission depending on your condition and test results.  What to do if you are LOW RISK for COVID-19?  Reduce your risk of any infection by using the same precautions used for avoiding the common cold or flu:  . Wash your hands often with soap and warm water for at least 20 seconds.  If soap and water are not readily available, use an alcohol-based hand sanitizer with at least 60% alcohol.  .   If coughing or sneezing, cover your mouth and nose by coughing or sneezing into the elbow areas of your shirt or coat, into a tissue or into your sleeve (not your hands). . Avoid shaking hands with others and consider head nods or verbal greetings only. . Avoid touching your eyes, nose, or mouth with unwashed hands.  . Avoid close contact with people who are sick. . Avoid places or events with large numbers of people in one  location, like concerts or sporting events. . Carefully consider travel plans you have or are making. . If you are planning any travel outside or inside the [place], visit the CDC's Travelers' Health webpage for the latest health notices. . If you have some symptoms but not all symptoms, continue to monitor at home and seek medical attention if your symptoms worsen. . If you are having a medical emergency, call 911.   ADDITIONAL HEALTHCARE OPTIONS FOR PATIENTS  [facility] / e-Visit: [email or web address]         [facility]: [phone]  [facility]: [phone]                   [facility]: [phone]   

## 2019-02-06 NOTE — Telephone Encounter (Signed)
Scheduled per los. Called and left msg

## 2019-02-06 NOTE — Progress Notes (Signed)
[facility] OFFICE PROGRESS NOTE  [name], MD [place] [address]  DIAGNOSIS: Stage IIIb (T3, N2, M0) non-small cell lung cancer, squamous cell carcinoma presented with right lower lobe lung mass in addition to subcarinal lymphadenopathy diagnosed in October 2020.   PRIOR THERAPY: None   CURRENT THERAPY: Weekly concurrent chemoradiation with Carboplatin for an AUC of 2 Paclitaxel 45 mg/m2. First dose 01/16/2019. Status post 3 cycles.  INTERVAL HISTORY: Martin Bailey 78 y.o. male returns to the clinic for a follow up visit. The patient is feeling well today without any concerning complaints. He recently had a port-a-cath placed and tolerated the procedure well. He has poor venous access and it happy with having the port-a-cath.The patient continues to tolerate treatment with carboplatin and paclitaxel well without any adverse side effects. Denies any fever, chills, night sweats, or weight loss. Denies any chest pain, shortness of breath, or hemoptysis. He reports his baseline productive cough which has been occurring prior to his diagnosis. He has tried Mucinex without significant relief of his mucus. Denies any nausea, vomiting, diarrhea, or significant constipation. Denies any headache or visual changes. He occasionally gets a nose bleed which resolves on its own after 10 minutes. He has had an issue with this in the past secondary to his Xarelto use for his atrial fibrillation. The patient is here today for evaluation prior to starting cycle #4.    MEDICAL HISTORY: Past Medical History:  Diagnosis Date  . Cataract    left  . COLONIC POLYPS, HX OF 04/05/2007   ONE ADENOMATOUS POLYP AND ONE HYPERPLASTIC POLYP  . DEPRESSION 03/04/2007  . DERMATITIS, SEBORRHEIC 07/19/2007  . FIBRILLATION, ATRIAL 03/04/2007  . Hearing aid worn    B/L  . HYPERLIPIDEMIA 03/04/2007  . HYPERTENSION 03/04/2007  . Kidney stones   . Lung cancer ([place])    non small cell  lung ca  . Lung mass    mediastinal adenopathy    ALLERGIES:  has No Known Allergies.  MEDICATIONS:  Current Outpatient Medications  Medication Sig Dispense Refill  . amLODipine (NORVASC) 5 MG tablet TAKE 1 TABLET BY MOUTH  DAILY (Patient taking differently: Take 5 mg by mouth daily. ) 90 tablet 3  . atenolol (TENORMIN) 50 MG tablet TAKE 1 TABLET BY MOUTH  TWICE DAILY (Patient taking differently: Take 50 mg by mouth 2 (two) times daily. ) 180 tablet 3  . clobetasol (TEMOVATE) 0.05 % external solution Apply 1 application topically 2 (two) times daily. For seborrheic dermatitis- 7 days maximum (Patient taking differently: Apply 1 application topically 2 (two) times daily as needed (For seborrheic dermatitis). 7 days maximum) 50 mL 0  . lidocaine-prilocaine (EMLA) cream Apply 1 application topically as needed. 30 g 0  . moxifloxacin (VIGAMOX) 0.5 % ophthalmic solution Place 1 drop into the right eye 4 (four) times daily.     . Multiple Vitamin (MULTIVITAMIN WITH MINERALS) TABS tablet Take 1 tablet by mouth daily.    . prednisoLONE acetate (PRED FORTE) 1 % ophthalmic suspension Place 1 drop into the right eye 4 (four) times daily.     . prochlorperazine (COMPAZINE) 10 MG tablet Take 1 tablet (10 mg total) by mouth every 6 (six) hours as needed for nausea or vomiting. 30 tablet 2  . triamcinolone cream (KENALOG) 0.5 % Apply topically 2 (two) times daily as needed. (Patient taking differently: Apply 1 application topically 2 (two) times daily as needed (skin irritation/rash). ) 30 g 1  .  XARELTO 20 MG TABS tablet TAKE 1 TABLET BY MOUTH  DAILY WITH SUPPER (Patient taking differently: Take 20 mg by mouth daily. ) 90 tablet 3   No current facility-administered medications for this visit.    Facility-Administered Medications Ordered in Other Visits  Medication Dose Route Frequency Provider Last Rate Last Dose  . CARBOplatin (PARAPLATIN) 210 mg in sodium chloride 0.9 % 100 mL chemo infusion  210 mg  Intravenous Once Curt Bears, MD      . dexamethasone (DECADRON) 20 mg in sodium chloride 0.9 % 50 mL IVPB  20 mg Intravenous Once Curt Bears, MD      . diphenhydrAMINE (BENADRYL) injection 50 mg  50 mg Intravenous Once Curt Bears, MD      . famotidine (PEPCID) IVPB 20 mg premix  20 mg Intravenous Once Curt Bears, MD      . heparin lock flush 100 unit/mL  500 Units Intracatheter Once PRN Curt Bears, MD      . PACLitaxel (TAXOL) 96 mg in sodium chloride 0.9 % 250 mL chemo infusion (</= 2m/m2)  45 mg/m2 (Treatment Plan Recorded) Intravenous Once MCurt Bears MD      . palonosetron (ALOXI) injection 0.25 mg  0.25 mg Intravenous Once MCurt Bears MD      . sodium chloride flush (NS) 0.9 % injection 10 mL  10 mL Intracatheter PRN MCurt Bears MD        SURGICAL HISTORY:  Past Surgical History:  Procedure Laterality Date  . CATARACT EXTRACTION W/ INTRAOCULAR LENS IMPLANT     right  . COLONOSCOPY    . IR IMAGING GUIDED PORT INSERTION  01/31/2019  . RADIOFREQUENCY ABLATION     x2 for fib, on 2nd attempt switched form a flutter ot a fib.   .Marland KitchenROTATOR CUFF REPAIR Right   . TONSILLECTOMY    . VIDEO BRONCHOSCOPY WITH ENDOBRONCHIAL ULTRASOUND Left 12/30/2018   Procedure: VIDEO BRONCHOSCOPY WITH ENDOBRONCHIAL ULTRASOUND WITH FLUOROSCOPY;  Surgeon: OLaurin Coder MD;  Location: [facility]  Service: Thoracic;  Laterality: Left;  . WISDOM TOOTH EXTRACTION      REVIEW OF SYSTEMS:   Review of Systems  Constitutional: Negative for appetite change, chills, fatigue, fever and unexpected weight change.  HENT: Positive for intermittent nosebleeds. Negative for mouth sores, sore throat and trouble swallowing.   Eyes: Negative for eye problems and icterus.  Respiratory: Positive for chronic cough. Negative for hemoptysis, shortness of breath and wheezing.   Cardiovascular: Negative for chest pain and leg swelling.  Gastrointestinal: Positive for mild constipation.  Negative for abdominal pain, diarrhea, nausea and vomiting.  Genitourinary: Negative for bladder incontinence, difficulty urinating, dysuria, frequency and hematuria.   Musculoskeletal: Negative for back pain, gait problem, neck pain and neck stiffness.  Skin: Negative for itching and rash.  Neurological: Negative for dizziness, extremity weakness, gait problem, headaches, light-headedness and seizures.  Hematological: Negative for adenopathy. Does not bruise/bleed easily.  Psychiatric/Behavioral: Negative for confusion, depression and sleep disturbance. The patient is not nervous/anxious.     PHYSICAL EXAMINATION:  Blood pressure 103/73, pulse 77, temperature 98.6 F (37 C), temperature source Temporal, resp. rate 18, height 5' 10" (1.778 m), weight 189 lb 11.2 oz (86 kg), SpO2 100 %.  ECOG PERFORMANCE STATUS: 1 - Symptomatic but completely ambulatory  Physical Exam  Constitutional: Oriented to person, place, and time and well-developed, well-nourished, and in no distress.  HENT:  Head: Normocephalic and atraumatic.  Mouth/Throat: Oropharynx is clear and moist. No oropharyngeal exudate.  Eyes: Conjunctivae  are normal. Right eye exhibits no discharge. Left eye exhibits no discharge. No scleral icterus.  Neck: Normal range of motion. Neck supple.  Cardiovascular: Normal rate, irregular rhythm, normal heart sounds and intact distal pulses.   Pulmonary/Chest: Effort normal and breath sounds normal except decreased breath sounds in RLL. No respiratory distress. No wheezes. No rales.  Abdominal: Soft. Bowel sounds are normal. Exhibits no distension and no mass. There is no tenderness.  Musculoskeletal: Normal range of motion. Exhibits no edema.  Lymphadenopathy:    No cervical adenopathy.  Neurological: Alert and oriented to person, place, and time. Exhibits normal muscle tone. Gait normal. Coordination normal.  Skin: Skin is warm and dry. No rash noted. Not diaphoretic. No erythema. No  pallor.  Psychiatric: Mood, memory and judgment normal.   LABORATORY DATA: Lab Results  Component Value Date   WBC 2.4 (L) 02/06/2019   HGB 11.0 (L) 02/06/2019   HCT 33.5 (L) 02/06/2019   MCV 90.5 02/06/2019   PLT 109 (L) 02/06/2019      Chemistry      Component Value Date/Time   NA 137 02/06/2019 1045   K 4.3 02/06/2019 1045   CL 104 02/06/2019 1045   CO2 25 02/06/2019 1045   BUN 9 02/06/2019 1045   CREATININE 0.77 02/06/2019 1045      Component Value Date/Time   CALCIUM 8.2 (L) 02/06/2019 1045   ALKPHOS 68 02/06/2019 1045   AST 17 02/06/2019 1045   ALT 18 02/06/2019 1045   BILITOT 0.3 02/06/2019 1045       RADIOGRAPHIC STUDIES:  Mr Jeri Cos OV Contrast  Result Date: 01/20/2019 CLINICAL DATA:  Squamous cell carcinoma lung.  Staging EXAM: MRI HEAD WITHOUT AND WITH CONTRAST TECHNIQUE: Multiplanar, multiecho pulse sequences of the brain and surrounding structures were obtained without and with intravenous contrast. CONTRAST:  57m GADAVIST GADOBUTROL 1 MMOL/ML IV SOLN COMPARISON:  None. FINDINGS: Brain: Moderate cerebral atrophy. Negative for hydrocephalus. Negative for infarct, hemorrhage, mass. No edema. Normal enhancement.  No enhancing metastatic disease in the brain. Vascular: Normal arterial flow voids. Skull and upper cervical spine: Negative Sinuses/Orbits: Mild mucosal edema paranasal sinuses. Bilateral cataract surgery Other: None IMPRESSION: Negative for metastatic disease.  No acute abnormality Electronically Signed   By: CFranchot GalloM.D.   On: 01/20/2019 16:32   Ir Imaging Guided Port Insertion  Result Date: 01/31/2019 INDICATION: History of lung cancer. In need of durable intravenous access for chemotherapy administration EXAM: IMPLANTED PORT A CATH PLACEMENT WITH ULTRASOUND AND FLUOROSCOPIC GUIDANCE COMPARISON:  PET-CT-12/02/2018; chest CT-11/21/2018 MEDICATIONS: Ancef 2 gm IV; The antibiotic was administered within an appropriate time interval prior to skin  puncture. ANESTHESIA/SEDATION: Moderate (conscious) sedation was employed during this procedure. A total of Versed 2 mg and Fentanyl 50 mcg was administered intravenously. Moderate Sedation Time: 24 minutes. The patient's level of consciousness and vital signs were monitored continuously by radiology nursing throughout the procedure under my direct supervision. CONTRAST:  None FLUOROSCOPY TIME:  24 seconds (7 mGy) COMPLICATIONS: None immediate. PROCEDURE: The procedure, risks, benefits, and alternatives were explained to the patient. Questions regarding the procedure were encouraged and answered. The patient understands and consents to the procedure. The right neck and chest were prepped with chlorhexidine in a sterile fashion, and a sterile drape was applied covering the operative field. Maximum barrier sterile technique with sterile gowns and gloves were used for the procedure. A timeout was performed prior to the initiation of the procedure. Local anesthesia was provided with 1% lidocaine  with epinephrine. After creating a small venotomy incision, a micropuncture kit was utilized to access the internal jugular vein. Real-time ultrasound guidance was utilized for vascular access including the acquisition of a permanent ultrasound image documenting patency of the accessed vessel. The microwire was utilized to measure appropriate catheter length. A subcutaneous port pocket was then created along the upper chest wall utilizing a combination of sharp and blunt dissection. The pocket was irrigated with sterile saline. A single lumen slim power injectable port was chosen for placement. The 8 Fr catheter was tunneled from the port pocket site to the venotomy incision. The port was placed in the pocket. The external catheter was trimmed to appropriate length. At the venotomy, an 8 Fr peel-away sheath was placed over a guidewire under fluoroscopic guidance. The catheter was then placed through the sheath and the sheath was  removed. Final catheter positioning was confirmed and documented with a fluoroscopic spot radiograph. The port was accessed with a Huber needle, aspirated and flushed with heparinized saline. The venotomy site was closed with an interrupted 4-0 Vicryl suture. The port pocket incision was closed with interrupted 2-0 Vicryl suture and the skin was opposed with a running subcuticular 4-0 Vicryl suture. Dermabond and Steri-strips were applied to both incisions. Dressings were placed. The patient tolerated the procedure well without immediate post procedural complication. FINDINGS: After catheter placement, the tip lies within the superior cavoatrial junction. The catheter aspirates and flushes normally and is ready for immediate use. IMPRESSION: Successful placement of a right internal jugular approach power injectable Port-A-Cath. The catheter is ready for immediate use. Electronically Signed   By: Sandi Mariscal M.D.   On: 01/31/2019 15:52     ASSESSMENT/PLAN:  This is a very pleasant 78 year old Caucasian male diagnosed with stage IIIb (T3, N2, M0) non-small cell lung cancer, squamous cell carcinoma.  He presented with a right lower lobe lung mass in addition to subcarinal lymphadenopathy.  He was diagnosed in October 2020. He is negative for any actionable mutations.  He is currently undergoing concurrent chemoradiation with carboplatin for an AUC of 2 and paclitaxel 45 mg/m.  He is status post 3 cycles.  Labs were reviewed. Recommend that he proceed with cycle #4 today as scheduled.   We will see him back for a follow up visit in 2 weeks for evaluation before starting cycle #6.   For his nose bleeding, the patient states that these episodes are "not that bad" and are self limiting. He knows if he experiences significant bleeding to go to the emergency room. He was instructed to reach out to his cardiologist who manages his blood thinner if his nose bleeds are persistent for their recommendations.   For  his cough, he states mucinex was not beneficial for his mucus. Advised that he can try to take delsym over the counter for his cough if he feels like he needs medication for his cough.   The patient was advised to call immediately if he has any concerning symptoms in the interval. The patient voices understanding of current disease status and treatment options and is in agreement with the current care plan. All questions were answered. The patient knows to call the clinic with any problems, questions or concerns. We can certainly see the patient much sooner if necessary  No orders of the defined types were placed in this encounter.    Cassandra L Heilingoetter, PA-C 02/06/19

## 2019-02-07 ENCOUNTER — Ambulatory Visit
Admission: RE | Admit: 2019-02-07 | Discharge: 2019-02-07 | Disposition: A | Discharge status: Home / Self Care | Payer: Medicare Other | Source: Ambulatory Visit | Attending: Radiation Oncology | Admitting: Radiation Oncology

## 2019-02-07 ENCOUNTER — Other Ambulatory Visit: Payer: Self-pay

## 2019-02-07 DIAGNOSIS — Z510 Encounter for antineoplastic radiation therapy: Secondary | ICD-10-CM | POA: Diagnosis not present

## 2019-02-07 DIAGNOSIS — C3431 Malignant neoplasm of lower lobe, right bronchus or lung: Secondary | ICD-10-CM | POA: Diagnosis not present

## 2019-02-08 ENCOUNTER — Other Ambulatory Visit: Payer: Self-pay

## 2019-02-08 ENCOUNTER — Ambulatory Visit
Admission: RE | Admit: 2019-02-08 | Discharge: 2019-02-08 | Disposition: A | Discharge status: Home / Self Care | Payer: Medicare Other | Source: Ambulatory Visit | Attending: Radiation Oncology | Admitting: Radiation Oncology

## 2019-02-08 DIAGNOSIS — C3431 Malignant neoplasm of lower lobe, right bronchus or lung: Secondary | ICD-10-CM | POA: Diagnosis not present

## 2019-02-08 DIAGNOSIS — Z510 Encounter for antineoplastic radiation therapy: Secondary | ICD-10-CM | POA: Diagnosis not present

## 2019-02-09 ENCOUNTER — Ambulatory Visit
Admission: RE | Admit: 2019-02-09 | Discharge: 2019-02-09 | Disposition: A | Discharge status: Home / Self Care | Payer: Medicare Other | Source: Ambulatory Visit | Attending: Radiation Oncology | Admitting: Radiation Oncology

## 2019-02-09 ENCOUNTER — Other Ambulatory Visit: Payer: Self-pay

## 2019-02-09 DIAGNOSIS — C3431 Malignant neoplasm of lower lobe, right bronchus or lung: Secondary | ICD-10-CM | POA: Diagnosis not present

## 2019-02-09 DIAGNOSIS — Z510 Encounter for antineoplastic radiation therapy: Secondary | ICD-10-CM | POA: Diagnosis not present

## 2019-02-10 ENCOUNTER — Other Ambulatory Visit: Payer: Self-pay

## 2019-02-10 ENCOUNTER — Ambulatory Visit
Admission: RE | Admit: 2019-02-10 | Discharge: 2019-02-10 | Disposition: A | Discharge status: Home / Self Care | Payer: Medicare Other | Source: Ambulatory Visit | Attending: Radiation Oncology | Admitting: Radiation Oncology

## 2019-02-10 DIAGNOSIS — Z510 Encounter for antineoplastic radiation therapy: Secondary | ICD-10-CM | POA: Diagnosis not present

## 2019-02-10 DIAGNOSIS — C3431 Malignant neoplasm of lower lobe, right bronchus or lung: Secondary | ICD-10-CM | POA: Diagnosis not present

## 2019-02-13 ENCOUNTER — Inpatient Hospital Stay: Payer: Medicare Other

## 2019-02-13 ENCOUNTER — Ambulatory Visit
Admission: RE | Admit: 2019-02-13 | Discharge: 2019-02-13 | Disposition: A | Discharge status: Home / Self Care | Payer: Medicare Other | Source: Ambulatory Visit | Attending: Radiation Oncology | Admitting: Radiation Oncology

## 2019-02-13 ENCOUNTER — Telehealth: Payer: Self-pay | Admitting: Radiation Oncology

## 2019-02-13 ENCOUNTER — Other Ambulatory Visit: Payer: Self-pay

## 2019-02-13 VITALS — BP 105/72 | HR 78 | Temp 97.8°F | Resp 18

## 2019-02-13 DIAGNOSIS — K5900 Constipation, unspecified: Secondary | ICD-10-CM | POA: Diagnosis not present

## 2019-02-13 DIAGNOSIS — Z79899 Other long term (current) drug therapy: Secondary | ICD-10-CM | POA: Diagnosis not present

## 2019-02-13 DIAGNOSIS — Z95828 Presence of other vascular implants and grafts: Secondary | ICD-10-CM

## 2019-02-13 DIAGNOSIS — Z7901 Long term (current) use of anticoagulants: Secondary | ICD-10-CM | POA: Diagnosis not present

## 2019-02-13 DIAGNOSIS — E785 Hyperlipidemia, unspecified: Secondary | ICD-10-CM | POA: Diagnosis not present

## 2019-02-13 DIAGNOSIS — Z510 Encounter for antineoplastic radiation therapy: Secondary | ICD-10-CM | POA: Diagnosis not present

## 2019-02-13 DIAGNOSIS — T451X5A Adverse effect of antineoplastic and immunosuppressive drugs, initial encounter: Secondary | ICD-10-CM | POA: Diagnosis not present

## 2019-02-13 DIAGNOSIS — I4891 Unspecified atrial fibrillation: Secondary | ICD-10-CM | POA: Diagnosis not present

## 2019-02-13 DIAGNOSIS — D6959 Other secondary thrombocytopenia: Secondary | ICD-10-CM | POA: Diagnosis not present

## 2019-02-13 DIAGNOSIS — Z5111 Encounter for antineoplastic chemotherapy: Secondary | ICD-10-CM | POA: Diagnosis not present

## 2019-02-13 DIAGNOSIS — R05 Cough: Secondary | ICD-10-CM | POA: Diagnosis not present

## 2019-02-13 DIAGNOSIS — R040 Epistaxis: Secondary | ICD-10-CM | POA: Diagnosis not present

## 2019-02-13 DIAGNOSIS — C3491 Malignant neoplasm of unspecified part of right bronchus or lung: Secondary | ICD-10-CM

## 2019-02-13 DIAGNOSIS — C3431 Malignant neoplasm of lower lobe, right bronchus or lung: Secondary | ICD-10-CM | POA: Diagnosis not present

## 2019-02-13 DIAGNOSIS — I10 Essential (primary) hypertension: Secondary | ICD-10-CM | POA: Diagnosis not present

## 2019-02-13 LAB — CMP (CANCER CENTER ONLY)
ALT: 25 U/L (ref 0–44)
AST: 22 U/L (ref 15–41)
Albumin: 3.4 g/dL — ABNORMAL LOW (ref 3.5–5.0)
Alkaline Phosphatase: 73 U/L (ref 38–126)
Anion gap: 9 (ref 5–15)
BUN: 13 mg/dL (ref 8–23)
CO2: 22 mmol/L (ref 22–32)
Calcium: 8.5 mg/dL — ABNORMAL LOW (ref 8.9–10.3)
Chloride: 104 mmol/L (ref 98–111)
Creatinine: 0.74 mg/dL (ref 0.61–1.24)
GFR, Est AFR Am: >60 mL/min (ref >60)
GFR, Estimated: >60 mL/min (ref >60)
Glucose, Bld: 96 mg/dL (ref 70–99)
Potassium: 4.4 mmol/L (ref 3.5–5.1)
Sodium: 135 mmol/L (ref 135–145)
Total Bilirubin: 0.5 mg/dL (ref 0.3–1.2)
Total Protein: 6.3 g/dL — ABNORMAL LOW (ref 6.5–8.1)

## 2019-02-13 LAB — CBC WITH DIFFERENTIAL (CANCER CENTER ONLY)
Abs Immature Granulocytes: 0.05 10*3/uL (ref 0.00–0.07)
Basophils Absolute: 0 10*3/uL (ref 0.0–0.1)
Basophils Relative: 1 %
Eosinophils Absolute: 0 10*3/uL (ref 0.0–0.5)
Eosinophils Relative: 1 %
HCT: 34.1 % — ABNORMAL LOW (ref 39.0–52.0)
Hemoglobin: 11.5 g/dL — ABNORMAL LOW (ref 13.0–17.0)
Immature Granulocytes: 2 %
Lymphocytes Relative: 36 %
Lymphs Abs: 1.2 10*3/uL (ref 0.7–4.0)
MCH: 29.8 pg (ref 26.0–34.0)
MCHC: 33.7 g/dL (ref 30.0–36.0)
MCV: 88.3 fL (ref 80.0–100.0)
Monocytes Absolute: 0.3 10*3/uL (ref 0.1–1.0)
Monocytes Relative: 10 %
Neutro Abs: 1.6 10*3/uL — ABNORMAL LOW (ref 1.7–7.7)
Neutrophils Relative %: 50 %
Platelet Count: 61 10*3/uL — ABNORMAL LOW (ref 150–400)
RBC: 3.86 MIL/uL — ABNORMAL LOW (ref 4.22–5.81)
RDW: 15.4 % (ref 11.5–15.5)
WBC Count: 3.2 10*3/uL — ABNORMAL LOW (ref 4.0–10.5)
nRBC: 0 % (ref 0.0–0.2)

## 2019-02-13 MED ORDER — HEPARIN SOD (PORK) LOCK FLUSH 100 UNIT/ML IV SOLN
500.0000 [IU] | Freq: Once | INTRAVENOUS | Status: AC
  Administered 2019-02-13: 15:00:00 500 [IU] via INTRAVENOUS
  Filled 2019-02-13: qty 5

## 2019-02-13 MED ORDER — SODIUM CHLORIDE 0.9% FLUSH
10.0000 mL | INTRAVENOUS | Status: DC | PRN
  Administered 2019-02-13: 10 mL via INTRAVENOUS
  Filled 2019-02-13: qty 10

## 2019-02-13 NOTE — Telephone Encounter (Addendum)
Phoned patient to inquire about status since prescribed Carafate over the weekend. Patient comfirms picking up script on Sunday afternoon and it working well for him that night. Reports he had to wait approximately 15 minutes after taking it this morning to get his breakfast down. Advised patient to crush tablet and stir it up in 1 mL of warm water creating a slurry. Advised patient to take this mixture 15-30 minutes before each meal and at bedtime. Explained this medication may not alleviate his pain completely but it should definitely ease it. Advised patient to chew his food well, cut it into smaller portions, eat soft foods, and avoid very hot or very cold food (eat and drink more room temperature items). Patient verbalized understanding of all reviewed and expressed appreciation for the call.

## 2019-02-13 NOTE — Patient Instructions (Signed)
Thrombocytopenia Thrombocytopenia is a condition in which you have a low number of platelets in your blood. Platelets are also called thrombocytes. Platelets are tiny cells in the blood. When you bleed, they clump together at the cut or injury to stop the bleeding. This is called blood clotting. Not having enough platelets can cause bleeding problems. Some cases of thrombocytopenia are mild while others are more severe. What are the causes? This condition may be caused by:  Decreased production of platelets. This may be caused by: ? Aplastic anemia. This is when your bone marrow stops making blood cells. ? Cancer in the bone marrow. ? Certain medicines, including chemotherapy. ? Infection in the bone marrow. ? Drinking a lot of alcohol.  Increased destruction of platelets. This may be caused by: ? Certain immune diseases. ? Certain medicines. ? Certain blood clotting disorders. ? Certain inherited disorders. ? Certain bleeding disorders. ? Pregnancy. ? Having an enlarged spleen (hypersplenism). In hypersplenism, the spleen gathers up platelets from circulation. This means that the platelets are not available to help with blood clotting. The spleen can be enlarged because of cirrhosis or other conditions. What are the signs or symptoms? Symptoms of this condition are the result of poor blood clotting. They will vary depending on how low the platelet counts are. Symptoms may include:  Abnormal bleeding.  Nosebleeds.  Heavy menstrual periods.  Blood in the urine or stool (feces).  A purplish discoloration in the skin (purpura).  Bruising.  A rash that looks like pinpoint, purplish-red spots (petechiae) on the skin and mucous membranes. How is this diagnosed?  This condition may be diagnosed with blood tests and a physical exam. Sometimes, a sample of bone marrow may be removed to look for the original cells (megakaryocytes) that make platelets. Other tests may be needed depending  on the cause. How is this treated? Treatment for this condition depends on the cause. Treatment options may include:  Treatment of another condition that is causing the low platelet count.  Medicines to help protect your platelets from being destroyed.  A replacement (transfusion) of platelets to stop or prevent bleeding.  Surgery to remove the spleen. Follow these instructions at home: Activity  Avoid activities that could cause injury or bruising, and follow instructions about how to prevent falls.  Take extra care not to cut yourself when you shave or when you use scissors, needles, knives, and other tools.  Take extra care to protect yourself from burns when ironing or cooking. General instructions   Check your skin and the inside of your mouth for bruising or bleeding as told by your health care provider.  Check your spit (sputum), urine, and stool for blood as told by your health care provider.  Do not drink alcohol.  Take over-the-counter and prescription medicines only as told by your health care provider.  Do not take any medicines that have aspirin or NSAIDs in them. These medicines can thin your blood and cause you to bleed more easily.  Tell all your health care providers, including dentists and eye doctors, about your condition. Contact a health care provider if you have:  Unexplained bruising. Get help right away if you have:  Active bleeding from anywhere on your body.  Blood in your sputum, urine, or stool. Summary  Thrombocytopenia is a condition in which you have a low number of platelets in your blood.  Platelets are needed for blood clotting.  Symptoms of this condition are the result of poor blood clotting and  may include abnormal bleeding, nosebleeds, and bruising.  This condition may be diagnosed with blood tests and a physical exam.  Treatment for this condition depends on the cause. This information is not intended to replace advice given  to you by your health care provider. Make sure you discuss any questions you have with your health care provider. Document Released: 03/16/2005 Document Revised: 12/16/2017 Document Reviewed: 12/16/2017 Elsevier Patient Education  2020 [employer].

## 2019-02-13 NOTE — Progress Notes (Signed)
No treatment today per MD [facility]

## 2019-02-14 ENCOUNTER — Other Ambulatory Visit: Payer: Self-pay

## 2019-02-14 ENCOUNTER — Ambulatory Visit
Admission: RE | Admit: 2019-02-14 | Discharge: 2019-02-14 | Disposition: A | Discharge status: Home / Self Care | Payer: Medicare Other | Source: Ambulatory Visit | Attending: Radiation Oncology | Admitting: Radiation Oncology

## 2019-02-14 DIAGNOSIS — C3431 Malignant neoplasm of lower lobe, right bronchus or lung: Secondary | ICD-10-CM | POA: Diagnosis not present

## 2019-02-14 DIAGNOSIS — Z510 Encounter for antineoplastic radiation therapy: Secondary | ICD-10-CM | POA: Diagnosis not present

## 2019-02-15 ENCOUNTER — Ambulatory Visit
Admission: RE | Admit: 2019-02-15 | Discharge: 2019-02-15 | Disposition: A | Discharge status: Home / Self Care | Payer: Medicare Other | Source: Ambulatory Visit | Attending: Radiation Oncology | Admitting: Radiation Oncology

## 2019-02-15 ENCOUNTER — Other Ambulatory Visit: Payer: Self-pay

## 2019-02-15 DIAGNOSIS — C3431 Malignant neoplasm of lower lobe, right bronchus or lung: Secondary | ICD-10-CM | POA: Diagnosis not present

## 2019-02-15 DIAGNOSIS — Z510 Encounter for antineoplastic radiation therapy: Secondary | ICD-10-CM | POA: Diagnosis not present

## 2019-02-16 ENCOUNTER — Encounter (INDEPENDENT_AMBULATORY_CARE_PROVIDER_SITE_OTHER): Payer: Medicare Other

## 2019-02-16 ENCOUNTER — Ambulatory Visit
Admission: RE | Admit: 2019-02-16 | Discharge: 2019-02-16 | Disposition: A | Discharge status: Home / Self Care | Payer: Medicare Other | Source: Ambulatory Visit | Attending: Radiation Oncology | Admitting: Radiation Oncology

## 2019-02-16 ENCOUNTER — Other Ambulatory Visit: Payer: Self-pay

## 2019-02-16 ENCOUNTER — Encounter: Payer: Self-pay | Admitting: Family Medicine

## 2019-02-16 ENCOUNTER — Ambulatory Visit (INDEPENDENT_AMBULATORY_CARE_PROVIDER_SITE_OTHER): Payer: Medicare Other | Admitting: Family Medicine

## 2019-02-16 VITALS — BP 98/70 | HR 81 | Temp 97.6°F | Ht 70.0 in | Wt 192.8 lb

## 2019-02-16 DIAGNOSIS — J301 Allergic rhinitis due to pollen: Secondary | ICD-10-CM

## 2019-02-16 DIAGNOSIS — C3491 Malignant neoplasm of unspecified part of right bronchus or lung: Secondary | ICD-10-CM

## 2019-02-16 DIAGNOSIS — I10 Essential (primary) hypertension: Secondary | ICD-10-CM

## 2019-02-16 DIAGNOSIS — Z86010 Personal history of colonic polyps: Secondary | ICD-10-CM

## 2019-02-16 DIAGNOSIS — Z0000 Encounter for general adult medical examination without abnormal findings: Secondary | ICD-10-CM | POA: Diagnosis not present

## 2019-02-16 DIAGNOSIS — E785 Hyperlipidemia, unspecified: Secondary | ICD-10-CM

## 2019-02-16 DIAGNOSIS — I4811 Longstanding persistent atrial fibrillation: Secondary | ICD-10-CM

## 2019-02-16 DIAGNOSIS — Z510 Encounter for antineoplastic radiation therapy: Secondary | ICD-10-CM | POA: Diagnosis not present

## 2019-02-16 DIAGNOSIS — F3342 Major depressive disorder, recurrent, in full remission: Secondary | ICD-10-CM

## 2019-02-16 DIAGNOSIS — Z87891 Personal history of nicotine dependence: Secondary | ICD-10-CM

## 2019-02-16 DIAGNOSIS — F1020 Alcohol dependence, uncomplicated: Secondary | ICD-10-CM

## 2019-02-16 DIAGNOSIS — C3431 Malignant neoplasm of lower lobe, right bronchus or lung: Secondary | ICD-10-CM | POA: Diagnosis not present

## 2019-02-16 DIAGNOSIS — K219 Gastro-esophageal reflux disease without esophagitis: Secondary | ICD-10-CM

## 2019-02-16 DIAGNOSIS — R7989 Other specified abnormal findings of blood chemistry: Secondary | ICD-10-CM

## 2019-02-16 NOTE — Progress Notes (Addendum)
Phone: [phone]   Subjective:  Patient presents today for their annual physical. Chief complaint-noted.   See problem oriented charting- ROS- full  review of systems was completed and negative  except for:  Nose bleeds, constipations  The following were reviewed and entered/updated in epic: Past Medical History:  Diagnosis Date  . Cataract    left  . COLONIC POLYPS, HX OF 04/05/2007   ONE ADENOMATOUS POLYP AND ONE HYPERPLASTIC POLYP  . DEPRESSION 03/04/2007  . DERMATITIS, SEBORRHEIC 07/19/2007  . FIBRILLATION, ATRIAL 03/04/2007  . Hearing aid worn    B/L  . HYPERLIPIDEMIA 03/04/2007  . HYPERTENSION 03/04/2007  . Kidney stones   . Lung cancer ([place])    non small cell lung ca  . Lung mass    mediastinal adenopathy   Patient Active Problem List   Diagnosis Date Noted  . Alcoholism ([place]) 06/21/2014    Priority: High  . Atrial fibrillation ([place]) 03/04/2007    Priority: High  . Elevated TSH 09/21/2014    Priority: Medium  . Seborrheic dermatitis 07/19/2007    Priority: Medium  . Hyperlipidemia 03/04/2007    Priority: Medium  . Major depression ([place]) 03/04/2007    Priority: Medium  . Essential hypertension 03/04/2007    Priority: Medium  . Former smoker 06/21/2014    Priority: Low  . Allergic rhinitis 04/06/2014    Priority: Low  . GERD (gastroesophageal reflux disease) 04/06/2014    Priority: Low  . Hx of adenomatous colonic polyps 03/05/2014    Priority: Low  . Chronic anticoagulation 03/05/2014    Priority: Low  . Testosterone deficiency 09/02/2010    Priority: Low  . Stage III squamous cell carcinoma of right lung ([place]) 01/06/2019  . Encounter for antineoplastic chemotherapy 01/06/2019  . Goals of care, counseling/discussion 01/06/2019  . Laryngopharyngeal reflux (LPR) 09/12/2018  . Cough 09/12/2018  . Disorientation 02/10/2018  . Overweight 08/09/2017   Past Surgical History:  Procedure Laterality Date  . CATARACT EXTRACTION W/ INTRAOCULAR LENS IMPLANT      right  . COLONOSCOPY    . IR IMAGING GUIDED PORT INSERTION  01/31/2019  . RADIOFREQUENCY ABLATION     x2 for fib, on 2nd attempt switched form a flutter ot a fib.   Marland Kitchen ROTATOR CUFF REPAIR Right   . TONSILLECTOMY    . VIDEO BRONCHOSCOPY WITH ENDOBRONCHIAL ULTRASOUND Left 12/30/2018   Procedure: VIDEO BRONCHOSCOPY WITH ENDOBRONCHIAL ULTRASOUND WITH FLUOROSCOPY;  Surgeon: Laurin Coder, MD;  Location: [place];  Service: Thoracic;  Laterality: Left;  . WISDOM TOOTH EXTRACTION      Family History  Problem Relation Age of Onset  . Osteoporosis Mother   . Dementia Mother   . Lung cancer Father        former smoker  . Colon cancer Neg Hx   . Stomach cancer Neg Hx   . Esophageal cancer Neg Hx   . Rectal cancer Neg Hx     Medications- reviewed and updated Current Outpatient Medications  Medication Sig Dispense Refill  . amLODipine (NORVASC) 5 MG tablet TAKE 1 TABLET BY MOUTH  DAILY (Patient taking differently: Take 5 mg by mouth daily. ) 90 tablet 3  . atenolol (TENORMIN) 50 MG tablet TAKE 1 TABLET BY MOUTH  TWICE DAILY (Patient taking differently: Take 50 mg by mouth 2 (two) times daily. ) 180 tablet 3  . clobetasol (TEMOVATE) 0.05 % external solution Apply 1 application topically 2 (two) times daily. For seborrheic dermatitis- 7 days maximum (Patient taking differently: Apply  1 application topically 2 (two) times daily as needed (For seborrheic dermatitis). 7 days maximum) 50 mL 0  . lidocaine-prilocaine (EMLA) cream Apply 1 application topically as needed. 30 g 0  . moxifloxacin (VIGAMOX) 0.5 % ophthalmic solution Place 1 drop into the right eye 4 (four) times daily.     . Multiple Vitamin (MULTIVITAMIN WITH MINERALS) TABS tablet Take 1 tablet by mouth daily.    . prednisoLONE acetate (PRED FORTE) 1 % ophthalmic suspension Place 1 drop into the right eye 4 (four) times daily.     . prochlorperazine (COMPAZINE) 10 MG tablet Take 1 tablet (10 mg total) by mouth every 6 (six) hours as  needed for nausea or vomiting. 30 tablet 2  . sucralfate (CARAFATE) 1 g tablet TAKE 1 TABLET BY MOUTH 4 TIMES DAILY AS NEEDED CRUSH 1 TABLET IN WATER AND TAKE 5 MINUTES BEFORE MEALS    . triamcinolone cream (KENALOG) 0.5 % Apply topically 2 (two) times daily as needed. (Patient taking differently: Apply 1 application topically 2 (two) times daily as needed (skin irritation/rash). ) 30 g 1  . XARELTO 20 MG TABS tablet TAKE 1 TABLET BY MOUTH  DAILY WITH SUPPER (Patient taking differently: Take 20 mg by mouth daily. ) 90 tablet 3   No current facility-administered medications for this visit.     Allergies-reviewed and updated No Known Allergies  Social History   Social History Narrative   Widower-wife died 31. 1 daughter. 2 grandkids. 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       Hobbies: golf, bridge, computer, sports, woodworking   Objective  Objective:  BP 98/70   Pulse 81   Temp 97.6 F (36.4 C)   Ht 5\' 10"  (1.778 m)   Wt 192 lb 12.8 oz (87.5 kg)   SpO2 99%   BMI 27.66 kg/m  Gen: NAD, resting comfortably HEENT: Mucous membranes are moist. Oropharynx normal. Nasal turbinates erythematous with some dried blood as well as some areas of red irritation- far worswe on left (tried silver nitrate which he tolerated quite well) Neck: no thyromegaly CV: Irregularly irregular no murmurs rubs or gallops Lungs: CTAB no crackles, wheeze, rhonchi Abdomen: soft/nontender/nondistended/normal bowel sounds. No rebound or guarding.  Ext: no edema Skin: warm, dry Neuro: grossly normal, moves all extremities, PERRLA   Assessment and Plan  78 y.o. male presenting for annual physical.  Health Maintenance counseling: 1. Anticipatory guidance: Patient counseled regarding regular dental exams yes -q6 months other than with cancer treatment, eye exams yes- yearly,  avoiding  smoking and second hand smoke yes , limiting alcohol to 2beverages per day- he is at 3 a day and encouraged to cut down.   2. Risk factor reduction:  Advised patient of need for regular exercise and diet rich and fruits and vegetables to reduce risk of heart attack and stroke. Exercise- not a lot exercise- down with chemo/radiation- encouraged trying walking on treadmill even 5-10 mins a day- thinks he could get up to 30.  Diet-cooks at home. Reasonably healthy diet- weight up some recently but will not press for dietary changes with cancer treatment Wt Readings from Last 3 Encounters:  02/16/19 192 lb 14.4 oz (87.5 kg)  02/16/19 192 lb 12.8 oz (87.5 kg)  02/06/19 189 lb 11.2 oz (86 kg)  3. Immunizations/screenings/ancillary studies- up to date Immunization History  Administered Date(s) Administered  .  Fluad Quad(high Dose 65+) 01/16/2019  . Influenza Split 12/22/2011  . Influenza Whole 01/18/2008, 01/04/2009  . Influenza, High Dose Seasonal PF 01/02/2016, 01/21/2017, 02/10/2018  . Influenza,inj,Quad PF,6+ Mos 12/23/2012, 02/20/2014, 12/21/2014  . Pneumococcal Conjugate-13 06/21/2014  . Pneumococcal Polysaccharide-23 01/18/2008  . Td 11/21/2001  . Tdap 12/22/2011  . Zoster 01/18/2008  . Zoster Recombinat (Shingrix) 02/03/2018, 04/07/2018  4. Prostate cancer screening- past age to based screening recommendations Lab Results  Component Value Date   PSA 0.44 11/25/2011   PSA 0.38 04/11/2009   PSA 0.38 07/19/2008   5. Colon cancer screening -Hx of adenomatous colonic polyps-plan was 1 year follow-up after April 2019 findings but this is currently being deferred due to cancer treatment.   6. Skin cancer screening- no recent visits- has been a few years. advised regular sunscreen use. Denies worrisome, changing, or new skin lesions.  7.  Former smoker-no lung cancer-see below.  Quit in 1970s-offered UA- declines for now.  No AAA on ultrasound last year  Status of chronic or acute concerns    Longstanding persistent atrial fibrillation (HCC)-compliant with Xarelto for anticoagulation and atenolol for rate control   Elevated TSH-noted in the past but not most recently-we opted to hold off on recheck  Lab Results  Component Value Date   TSH 3.63 02/10/2018   Hyperlipidemia, unspecified hyperlipidemia type-patient not currently on treatment- he wants to defer lipids for now- will do next year- would not likely start statin during chemo anyway   Essential hypertension-compliant with amlodipine 5 mg and atenolol 50 mg-well-controlled today.  Blood pressure has been running lower lately-I suggested we reduce amlodipine to 2.5 mg. From avs "Cut amlodipine 5mg  tablets in half for now and let me know if blood pressure does not get above 100/60 or gets above 140/90 at home. " BP Readings from Last 3 Encounters:  02/16/19 98/70  02/16/19 98/70  02/13/19 105/72    Recurrent major depressive disorder, in full remission (HCC)-despite current obstacles rates PHQ-9 is <5 today.  He is not currently on treatment.  He is doing his best to make it through treatments for his cancer   Alcoholism (HCC)-continues to drink but down to 3 or less and making drinks weaker  - congratulated on cutting down- he does not want to reduce below this.   Stage III squamous cell carcinoma of right lung (HCC)-currently undergoing radiation treatment and concurrent chemotherapy. One visit moved back due to low platelets.  Cough is ongoing concern. Discouraged not improving on treatment.  -3 chemo sessions, 2 radiation sessions left  Seasonal allergic rhinitis due to pollen--pt c/o nose bleeds any time he blows his nose-Xarelto likely contributes.  He is not on Flonase. Platelets have been low and most recent chemo cancelled. He is able to stop the bleeding- always left nostril. Has humidifier available if needed but humidity has been high in house. Could try otc nasal saline spray. We tried silver nitrate cautery on  accessible areas.  He will let me know if wants ENT referral but defers for now  Gastroesophageal reflux disease without esophagitis no major issues. carafate has been used for some swallowing issues   Recommended follow up: 6 months follow up or sooner if needed Future Appointments  Date Time Provider [place]  02/16/2019 10:30 AM [facility] LINAC 1 [facility] None  02/17/2019  9:45 AM [facility] LINAC 1 [facility] None  02/20/2019  9:45 AM [facility] LINAC 1 [facility] None  02/20/2019 11:15 AM [facility] LAB 6 [facility] None  02/20/2019  11:30 AM [facility] FLUSH [facility] None  02/20/2019 12:00 PM Curt Bears, MD [facility] None  02/20/2019  1:00 PM [facility] INFUSION [facility] None  02/21/2019  9:45 AM [facility] LINAC 1 [facility] None  02/22/2019  9:45 AM [facility] LINAC 1 [facility] None  02/27/2019  9:45 AM [facility] LINAC 1 [facility] None  02/27/2019 10:15 AM [facility] LAB 6 [facility] None  02/27/2019 10:30 AM [facility] FLUSH [facility] None  02/27/2019 11:00 AM [facility] INFUSION [facility] None  02/28/2019  9:45 AM [facility] LINAC 1 [facility] None  03/01/2019  9:45 AM [facility] LINAC 1 [facility] None  03/02/2019  9:45 AM [facility] LINAC 1 [facility] None  03/03/2019  9:45 AM [facility] LINAC 1 [facility] None   Lab/Order associations: Not fasting   ICD-10-CM   1. Preventative health care  Z00.00   2. Longstanding persistent atrial fibrillation (HCC)  I48.11   3. Elevated TSH  R79.89   4. Hyperlipidemia, unspecified hyperlipidemia type  E78.5   5. Essential hypertension  I10   6. Recurrent major depressive disorder, in full remission ([place])  F33.42   7. Alcoholism ([place])  F10.20   8. Stage III squamous cell carcinoma of right lung (HCC)  C34.91   9. Former smoker  Z87.891   55. Seasonal allergic rhinitis due to pollen  J30.1   11. Gastroesophageal reflux disease without esophagitis  K21.9   12. Hx of adenomatous  colonic polyps  Z86.010    Return precautions advised.  Garret Reddish, MD

## 2019-02-16 NOTE — Patient Instructions (Addendum)
Health Maintenance Due  Topic Date Due  . COLONOSCOPY -hold off at least until the spring 07/28/2018   Cut amlodipine 5mg  tablets in half for now and let me know if blood pressure does not get above 100/60 or gets above 140/90 at home.   Let us know if you want ENT referral for attempt at more formal cautery- hopeful silver nitrate today helps though- left nostril was definitely at high risk for bleeds   Recommended follow up: 6 months follow up or sooner if needed   Martin Bailey , Thank you for taking time to come for your Medicare Wellness Visit. I appreciate your ongoing commitment to your health goals. Please review the following plan we discussed and let me know if I can assist you in the future.   These are the goals we discussed: 1. Try to get back on the treadmill with goal at least 10 minutes a day and can build up to higher amounts 2. Limit alcohol to 2 a day or less   This is a list of the screening recommended for you and due dates:  Health Maintenance  Topic Date Due  . Colon Cancer Screening  07/28/2018  . Tetanus Vaccine  12/21/2021  . Flu Shot  Completed  . Pneumonia vaccines  Completed

## 2019-02-16 NOTE — Progress Notes (Signed)
Phone: [phone]    Subjective:   Patient presents today for their annual wellness visit.    Preventive Screening-Counseling & Management  Smoking Status: former Smoker- quit in 1970s- 1979 Second Hand Smoking status: No smokers in home Alcohol intake: about 3 per day- discussed limiting to 2 or less ideally  Risk Factors Regular exercise: limited by recent treatments- he is going to try to add some walking on is treadmill back in Diet: trying to maintain weight through chemo/radiation. Reasonably healthy diet  Wt Readings from Last 3 Encounters:  02/16/19 192 lb 14.4 oz (87.5 kg)  02/16/19 192 lb 12.8 oz (87.5 kg)  02/06/19 189 lb 11.2 oz (86 kg)   Fall Risk: None  Fall Risk  02/16/2019 02/10/2018 10/06/2016 08/13/2016 08/08/2015  Falls in the past year? 0 0 No No No  Number falls in past yr: 0 - - - -  Injury with Fall? 0 - - - -  Opioid use history:  no long term opioids use  Cardiac risk factors:  advanced age (older than 4 for men, 54 for women)  known Hyperlipidemia - untreated at present- we opted to do lipid panel in 6 months and consider statin at that time but not in the middle of therapy The 10-year ASCVD risk score Mikey Bussing DC Brooke Bonito., et al., 2013) is: 18.8%   Values used to calculate the score:     Age: 78 years     Sex: Male     Is Non-Hispanic African American: No     Diabetic: No     Tobacco smoker: No     Systolic Blood Pressure: 98 mmHg     Is BP treated: Yes     HDL Cholesterol: 48.5 mg/dL     Total Cholesterol: 159 mg/dL no Hypertension  No diabetes.  Lab Results  Component Value Date   HGBA1C 5.5 06/28/2014  Family History: none   Depression Screen None. PHQ2 0 - phq9 controlled today- has had issues in past. Depression in remission Depression screen [facility] 2/9 02/16/2019 06/20/2018 02/10/2018 08/09/2017 10/06/2016  Decreased Interest 0 2 2 1 3   Down, Depressed, Hopeless 0 2 3 1 3   PHQ - 2 Score 0 4 5 2 6   Altered sleeping 0 0 0 0 0  Tired,  decreased energy 1 3 1  0 0  Change in appetite 0 0 0 0 0  Feeling bad or failure about yourself  0 1 1 1 1   Trouble concentrating 0 0 0 0 0  Moving slowly or fidgety/restless 0 0 0 0 0  Suicidal thoughts 0 1 0 0 0  PHQ-9 Score 1 9 7 3 7   Difficult doing work/chores Not difficult at all Not difficult at all Not difficult at all - Somewhat difficult   Activities of Daily Living Independent ADLs and IADLs   Hearing Difficulties: -patient has hearing aids and uses intermittently- 60 years of playing trombone and years of woodworking  Cognitive Testing             No reported trouble.   Mini cog: normal clock draw. 3/3 delayed recall. Normal test result   [facility] baby  List the Names of Other Physician/Practitioners you currently use: Patient Care Team: [name], MD as PCP - General (Family Medicine) Jule Ser as Consulting Physician (Dentistry) Valrie Hart, RN as Oncology Nurse Navigator Dr. Tammi Klippel radiation oncology Dr. Earlie Server oncology  Immunization History  Administered Date(s) Administered  . Fluad Quad(high Dose 65+) 01/16/2019  .  Influenza Split 12/22/2011  . Influenza Whole 01/18/2008, 01/04/2009  . Influenza, High Dose Seasonal PF 01/02/2016, 01/21/2017, 02/10/2018  . Influenza,inj,Quad PF,6+ Mos 12/23/2012, 02/20/2014, 12/21/2014  . Pneumococcal Conjugate-13 06/21/2014  . Pneumococcal Polysaccharide-23 01/18/2008  . Td 11/21/2001  . Tdap 12/22/2011  . Zoster 01/18/2008  . Zoster Recombinat (Shingrix) 02/03/2018, 04/07/2018   Required Immunizations needed today - fully up to date on vaccines Health Maintenance  Topic Date Due  . Colon Cancer Screening  07/28/2018  . Tetanus Vaccine  12/21/2021  . Flu Shot  Completed  . Pneumonia vaccines  Completed   Screening tests-  AAA screen last year normal  Colon cancer screening- planned for next year likely- pushing back due to cancer treatment 1. Lung Cancer screening- currently being  treated for lung cancer 2. Skin cancer screening-  Dermatology as needed. No concerns today 3. Prostate cancer screening- past age based screening Lab Results  Component Value Date   PSA 0.44 11/25/2011   PSA 0.38 04/11/2009   PSA 0.38 07/19/2008   ROS- No pertinent positives discovered in course of AWV ROS CPE- nose bleeds, constipation  The following were reviewed and entered/updated in epic: Past Medical History:  Diagnosis Date  . Cataract    left  . COLONIC POLYPS, HX OF 04/05/2007   ONE ADENOMATOUS POLYP AND ONE HYPERPLASTIC POLYP  . DEPRESSION 03/04/2007  . DERMATITIS, SEBORRHEIC 07/19/2007  . FIBRILLATION, ATRIAL 03/04/2007  . Hearing aid worn    B/L  . HYPERLIPIDEMIA 03/04/2007  . HYPERTENSION 03/04/2007  . Kidney stones   . Lung cancer ([place])    non small cell lung ca  . Lung mass    mediastinal adenopathy   Patient Active Problem List   Diagnosis Date Noted  . Alcoholism ([place]) 06/21/2014    Priority: High  . Atrial fibrillation ([place]) 03/04/2007    Priority: High  . Elevated TSH 09/21/2014    Priority: Medium  . Seborrheic dermatitis 07/19/2007    Priority: Medium  . Hyperlipidemia 03/04/2007    Priority: Medium  . Major depression ([place]) 03/04/2007    Priority: Medium  . Essential hypertension 03/04/2007    Priority: Medium  . Former smoker 06/21/2014    Priority: Low  . Allergic rhinitis 04/06/2014    Priority: Low  . GERD (gastroesophageal reflux disease) 04/06/2014    Priority: Low  . Hx of adenomatous colonic polyps 03/05/2014    Priority: Low  . Chronic anticoagulation 03/05/2014    Priority: Low  . Testosterone deficiency 09/02/2010    Priority: Low  . Stage III squamous cell carcinoma of right lung ([place]) 01/06/2019  . Encounter for antineoplastic chemotherapy 01/06/2019  . Goals of care, counseling/discussion 01/06/2019  . Laryngopharyngeal reflux (LPR) 09/12/2018  . Cough 09/12/2018  . Disorientation 02/10/2018  . Overweight 08/09/2017    Past Surgical History:  Procedure Laterality Date  . CATARACT EXTRACTION W/ INTRAOCULAR LENS IMPLANT     right  . COLONOSCOPY    . IR IMAGING GUIDED PORT INSERTION  01/31/2019  . RADIOFREQUENCY ABLATION     x2 for fib, on 2nd attempt switched form a flutter ot a fib.   Marland Kitchen ROTATOR CUFF REPAIR Right   . TONSILLECTOMY    . VIDEO BRONCHOSCOPY WITH ENDOBRONCHIAL ULTRASOUND Left 12/30/2018   Procedure: VIDEO BRONCHOSCOPY WITH ENDOBRONCHIAL ULTRASOUND WITH FLUOROSCOPY;  Surgeon: Laurin Coder, MD;  Location: [facility];  Service: Thoracic;  Laterality: Left;  . WISDOM TOOTH EXTRACTION      Family History  Problem Relation  Age of Onset  . Osteoporosis Mother   . Dementia Mother   . Lung cancer Father        former smoker  . Colon cancer Neg Hx   . Stomach cancer Neg Hx   . Esophageal cancer Neg Hx   . Rectal cancer Neg Hx     Medications- reviewed and updated Current Outpatient Medications  Medication Sig Dispense Refill  . amLODipine (NORVASC) 5 MG tablet TAKE 1 TABLET BY MOUTH  DAILY (Patient taking differently: Take 5 mg by mouth daily. ) 90 tablet 3  . atenolol (TENORMIN) 50 MG tablet TAKE 1 TABLET BY MOUTH  TWICE DAILY (Patient taking differently: Take 50 mg by mouth 2 (two) times daily. ) 180 tablet 3  . clobetasol (TEMOVATE) 0.05 % external solution Apply 1 application topically 2 (two) times daily. For seborrheic dermatitis- 7 days maximum (Patient taking differently: Apply 1 application topically 2 (two) times daily as needed (For seborrheic dermatitis). 7 days maximum) 50 mL 0  . lidocaine-prilocaine (EMLA) cream Apply 1 application topically as needed. 30 g 0  . moxifloxacin (VIGAMOX) 0.5 % ophthalmic solution Place 1 drop into the right eye 4 (four) times daily.     . Multiple Vitamin (MULTIVITAMIN WITH MINERALS) TABS tablet Take 1 tablet by mouth daily.    . prednisoLONE acetate (PRED FORTE) 1 % ophthalmic suspension Place 1 drop into the right eye 4 (four) times daily.     .  prochlorperazine (COMPAZINE) 10 MG tablet Take 1 tablet (10 mg total) by mouth every 6 (six) hours as needed for nausea or vomiting. 30 tablet 2  . sucralfate (CARAFATE) 1 g tablet TAKE 1 TABLET BY MOUTH 4 TIMES DAILY AS NEEDED CRUSH 1 TABLET IN WATER AND TAKE 5 MINUTES BEFORE MEALS    . triamcinolone cream (KENALOG) 0.5 % Apply topically 2 (two) times daily as needed. (Patient taking differently: Apply 1 application topically 2 (two) times daily as needed (skin irritation/rash). ) 30 g 1  . XARELTO 20 MG TABS tablet TAKE 1 TABLET BY MOUTH  DAILY WITH SUPPER (Patient taking differently: Take 20 mg by mouth daily. ) 90 tablet 3   No current facility-administered medications for this visit.     Allergies-reviewed and updated No Known Allergies  Social History   Socioeconomic History  . Marital status: Widowed    Spouse name: Not on file  . Number of children: 1  . Years of education: Not on file  . Highest education level: Not on file  Occupational History  . Occupation: retired  Scientific laboratory technician  . Financial resource strain: Not on file  . Food insecurity    Worry: Not on file    Inability: Not on file  . Transportation needs    Medical: Not on file    Non-medical: Not on file  Tobacco Use  . Smoking status: Former Smoker    Packs/day: 1.50    Years: 10.00    Pack years: 15.00    Types: Cigarettes    Quit date: 07/08/1977    Years since quitting: 41.6  . Smokeless tobacco: Never Used  Substance and Sexual Activity  . Alcohol use: Yes    Alcohol/week: 42.0 standard drinks    Types: 42 Standard drinks or equivalent per week    Comment: 4 scotch's a day  . Drug use: No  . Sexual activity: Not on file  Lifestyle  . Physical activity    Days per week: Not on file  Minutes per session: Not on file  . Stress: Not on file  Relationships  . Social Herbalist on phone: Not on file    Gets together: Not on file    Attends religious service: Not on file    Active  member of club or organization: Not on file    Attends meetings of clubs or organizations: Not on file    Relationship status: Not on file  Other Topics Concern  . Not on file  Social History Narrative   Widower-wife died 66. 1 daughter. 2 grandkids. 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       Hobbies: golf, bridge, computer, sports, woodworking      Objective:  BP 98/70   Pulse 81   Temp 97.6 F (36.4 C)   Ht 5\' 10"  (1.778 m)   Wt 192 lb 12.8 oz (87.5 kg)   SpO2 99%   BMI 27.66 kg/m  Gen: NAD, resting comfortably HEENT: Mucous membranes are moist. Oropharynx normal Neck: no thyromegaly CV: RRR no murmurs rubs or gallops Lungs: CTAB no crackles, wheeze, rhonchi Abdomen: soft/nontender/nondistended/normal bowel sounds. No rebound or guarding.  Ext: no edema Skin: warm, dry Neuro: grossly normal, moves all extremities, PERRLA   Assessment/Plan:  AWV completed- discussed recommended screenings and documented any personalized health advice and referrals for preventive counseling. See AVS as well which was given to patient.   Status of chronic or acute concerns  See CPE from today  Recommended follow up: 1 year AWV and CPE, 6 month follow up or sooner if needed Future Appointments  Date Time Provider [place]  02/16/2019 10:30 AM [facility] LIN[facility] 1 [facility] None  02/17/2019  9:45 AM [facility] LINAC 1 [facility] None  02/20/2019  9:45 AM [facility] LINAC 1 [facility] None  02/20/2019 11:15 AM [facility] LAB 6 [facility] None  02/20/2019 11:30 AM [facility] [place] FLUSH [facility] None  02/20/2019 12:00 PM Curt Bears, MD [facility] None  02/20/2019  1:00 PM [facility] INFUSION [facility] None  02/21/2019  9:45 AM [facility] LINAC 1 [facility] None  02/22/2019  9:45 AM [facility] LINAC 1 [facility] None  02/27/2019  9:45 AM  [facility] LINAC 1 [facility] None  02/27/2019 10:15 AM [facility] LAB 6 [facility] None  02/27/2019 10:30 AM [facility] FLUSH [facility] None  02/27/2019 11:00 AM [facility] INFUSION [facility] None  02/28/2019  9:45 AM [facility] LINAC 1 [facility] None  03/01/2019  9:45 AM [facility] LINAC 1 [facility] None  03/02/2019  9:45 AM [facility] LINAC 1 [facility] None  03/03/2019  9:45 AM [facility] LINAC 1 [facility] None     Lab/Order associations:   ICD-10-CM   1. Preventative health care  Z00.00    Return precautions advised.  Garret Reddish, MD

## 2019-02-17 ENCOUNTER — Other Ambulatory Visit: Payer: Self-pay

## 2019-02-17 ENCOUNTER — Ambulatory Visit
Admission: RE | Admit: 2019-02-17 | Discharge: 2019-02-17 | Disposition: A | Discharge status: Home / Self Care | Payer: Medicare Other | Source: Ambulatory Visit | Attending: Radiation Oncology | Admitting: Radiation Oncology

## 2019-02-17 DIAGNOSIS — C3431 Malignant neoplasm of lower lobe, right bronchus or lung: Secondary | ICD-10-CM | POA: Diagnosis not present

## 2019-02-17 DIAGNOSIS — Z510 Encounter for antineoplastic radiation therapy: Secondary | ICD-10-CM | POA: Diagnosis not present

## 2019-02-20 ENCOUNTER — Other Ambulatory Visit: Payer: Self-pay

## 2019-02-20 ENCOUNTER — Inpatient Hospital Stay: Payer: Medicare Other

## 2019-02-20 ENCOUNTER — Inpatient Hospital Stay: Payer: Medicare Other | Admitting: Physician Assistant

## 2019-02-20 ENCOUNTER — Ambulatory Visit
Admission: RE | Admit: 2019-02-20 | Discharge: 2019-02-20 | Disposition: A | Discharge status: Home / Self Care | Payer: Medicare Other | Source: Ambulatory Visit | Attending: Radiation Oncology | Admitting: Radiation Oncology

## 2019-02-20 VITALS — BP 103/73 | HR 76 | Temp 98.2°F | Resp 18 | Ht 70.0 in | Wt 191.5 lb

## 2019-02-20 DIAGNOSIS — T451X5A Adverse effect of antineoplastic and immunosuppressive drugs, initial encounter: Secondary | ICD-10-CM | POA: Diagnosis not present

## 2019-02-20 DIAGNOSIS — K5900 Constipation, unspecified: Secondary | ICD-10-CM | POA: Diagnosis not present

## 2019-02-20 DIAGNOSIS — C3431 Malignant neoplasm of lower lobe, right bronchus or lung: Secondary | ICD-10-CM | POA: Diagnosis not present

## 2019-02-20 DIAGNOSIS — D6959 Other secondary thrombocytopenia: Secondary | ICD-10-CM | POA: Diagnosis not present

## 2019-02-20 DIAGNOSIS — Z95828 Presence of other vascular implants and grafts: Secondary | ICD-10-CM

## 2019-02-20 DIAGNOSIS — C3491 Malignant neoplasm of unspecified part of right bronchus or lung: Secondary | ICD-10-CM | POA: Diagnosis not present

## 2019-02-20 DIAGNOSIS — I4891 Unspecified atrial fibrillation: Secondary | ICD-10-CM | POA: Diagnosis not present

## 2019-02-20 DIAGNOSIS — R05 Cough: Secondary | ICD-10-CM | POA: Diagnosis not present

## 2019-02-20 DIAGNOSIS — Z5111 Encounter for antineoplastic chemotherapy: Secondary | ICD-10-CM | POA: Diagnosis not present

## 2019-02-20 DIAGNOSIS — Z7901 Long term (current) use of anticoagulants: Secondary | ICD-10-CM | POA: Diagnosis not present

## 2019-02-20 DIAGNOSIS — Z510 Encounter for antineoplastic radiation therapy: Secondary | ICD-10-CM | POA: Diagnosis not present

## 2019-02-20 DIAGNOSIS — E785 Hyperlipidemia, unspecified: Secondary | ICD-10-CM | POA: Diagnosis not present

## 2019-02-20 DIAGNOSIS — R040 Epistaxis: Secondary | ICD-10-CM | POA: Diagnosis not present

## 2019-02-20 DIAGNOSIS — I10 Essential (primary) hypertension: Secondary | ICD-10-CM | POA: Diagnosis not present

## 2019-02-20 DIAGNOSIS — Z79899 Other long term (current) drug therapy: Secondary | ICD-10-CM | POA: Diagnosis not present

## 2019-02-20 LAB — CBC WITH DIFFERENTIAL (CANCER CENTER ONLY)
Abs Immature Granulocytes: 0.02 10*3/uL (ref 0.00–0.07)
Basophils Absolute: 0 10*3/uL (ref 0.0–0.1)
Basophils Relative: 2 %
Eosinophils Absolute: 0 10*3/uL (ref 0.0–0.5)
Eosinophils Relative: 2 %
HCT: 33.6 % — ABNORMAL LOW (ref 39.0–52.0)
Hemoglobin: 11.2 g/dL — ABNORMAL LOW (ref 13.0–17.0)
Immature Granulocytes: 1 %
Lymphocytes Relative: 27 %
Lymphs Abs: 0.6 10*3/uL — ABNORMAL LOW (ref 0.7–4.0)
MCH: 30.2 pg (ref 26.0–34.0)
MCHC: 33.3 g/dL (ref 30.0–36.0)
MCV: 90.6 fL (ref 80.0–100.0)
Monocytes Absolute: 0.5 10*3/uL (ref 0.1–1.0)
Monocytes Relative: 22 %
Neutro Abs: 1 10*3/uL — ABNORMAL LOW (ref 1.7–7.7)
Neutrophils Relative %: 46 %
Platelet Count: 51 10*3/uL — ABNORMAL LOW (ref 150–400)
RBC: 3.71 MIL/uL — ABNORMAL LOW (ref 4.22–5.81)
RDW: 17.7 % — ABNORMAL HIGH (ref 11.5–15.5)
WBC Count: 2.1 10*3/uL — ABNORMAL LOW (ref 4.0–10.5)
nRBC: 0 % (ref 0.0–0.2)

## 2019-02-20 LAB — CMP (CANCER CENTER ONLY)
ALT: 27 U/L (ref 0–44)
AST: 26 U/L (ref 15–41)
Albumin: 3.2 g/dL — ABNORMAL LOW (ref 3.5–5.0)
Alkaline Phosphatase: 77 U/L (ref 38–126)
Anion gap: 8 (ref 5–15)
BUN: 10 mg/dL (ref 8–23)
CO2: 24 mmol/L (ref 22–32)
Calcium: 8.4 mg/dL — ABNORMAL LOW (ref 8.9–10.3)
Chloride: 106 mmol/L (ref 98–111)
Creatinine: 0.78 mg/dL (ref 0.61–1.24)
GFR, Est AFR Am: >60 mL/min (ref >60)
GFR, Estimated: >60 mL/min (ref >60)
Glucose, Bld: 119 mg/dL — ABNORMAL HIGH (ref 70–99)
Potassium: 4.4 mmol/L (ref 3.5–5.1)
Sodium: 138 mmol/L (ref 135–145)
Total Bilirubin: 0.5 mg/dL (ref 0.3–1.2)
Total Protein: 5.8 g/dL — ABNORMAL LOW (ref 6.5–8.1)

## 2019-02-20 MED ORDER — METHYLPREDNISOLONE 4 MG PO TBPK: ORAL_TABLET | ORAL | 0 refills | Status: DC

## 2019-02-20 MED ORDER — SODIUM CHLORIDE 0.9% FLUSH
10.0000 mL | Freq: Once | INTRAVENOUS | Status: AC
  Administered 2019-02-20: 10 mL
  Filled 2019-02-20: qty 10

## 2019-02-20 NOTE — Progress Notes (Signed)
[facility] OFFICE PROGRESS NOTE  [name], MD [place] [address]  DIAGNOSIS: Stage IIIb (T3, N2, M0) non-small cell lung cancer, squamous cell carcinoma presented with right lower lobe lung mass in addition to subcarinal lymphadenopathy diagnosed in October 2020.  PRIOR THERAPY: None  CURRENT THERAPY: Weekly concurrent chemoradiation with Carboplatin for an AUC of 2 Paclitaxel 45 mg/m2. First dose 01/16/2019. Status post 4 cycles.  INTERVAL HISTORY: Martin Bailey 78 y.o. male returns to the clinic for a follow-up visit.  The patient is feeling well today without any concerning complaints.  The patient's weekly chemotherapy was held last week secondary to thrombocytopenia. Otherwise, the patient has been tolerating treatment well without any other concerning adverse effects. His last day of radiation is scheduled for 03/03/2019. The patient denies any recent fevers, chills, night sweats, or weight loss.  He reports his chronic cough which is somewhat better than before.  He denies any shortness of breath, chest pain, or hemoptysis.  He denies any more nausea, vomiting, or diarrhea.  He experiences mild constipation which is managed with over-the-counter medicines.  He denies any headache or visual changes.  The patient occasionally has nosebleeds secondary to his Xarelto use.  He had his nose cauterized last week for this concern.  He denies any other bleeding or bruising including gingival bleeding, melena, hematochezia, or hematuria.  The patient is here today for evaluation before starting cycle #5.   MEDICAL HISTORY: Past Medical History:  Diagnosis Date  . Cataract    left  . COLONIC POLYPS, HX OF 04/05/2007   ONE ADENOMATOUS POLYP AND ONE HYPERPLASTIC POLYP  . DEPRESSION 03/04/2007  . DERMATITIS, SEBORRHEIC 07/19/2007  . FIBRILLATION, ATRIAL 03/04/2007  . Hearing aid worn    B/L  . HYPERLIPIDEMIA 03/04/2007  . HYPERTENSION 03/04/2007  .  Kidney stones   . Lung cancer ([facility])    non small cell lung ca  . Lung mass    mediastinal adenopathy    ALLERGIES:  has No Known Allergies.  MEDICATIONS:  Current Outpatient Medications  Medication Sig Dispense Refill  . amLODipine (NORVASC) 5 MG tablet TAKE 1 TABLET BY MOUTH  DAILY (Patient taking differently: Take 5 mg by mouth daily. ) 90 tablet 3  . atenolol (TENORMIN) 50 MG tablet TAKE 1 TABLET BY MOUTH  TWICE DAILY (Patient taking differently: Take 50 mg by mouth 2 (two) times daily. ) 180 tablet 3  . clobetasol (TEMOVATE) 0.05 % external solution Apply 1 application topically 2 (two) times daily. For seborrheic dermatitis- 7 days maximum (Patient taking differently: Apply 1 application topically 2 (two) times daily as needed (For seborrheic dermatitis). 7 days maximum) 50 mL 0  . lidocaine-prilocaine (EMLA) cream Apply 1 application topically as needed. 30 g 0  . moxifloxacin (VIGAMOX) 0.5 % ophthalmic solution Place 1 drop into the right eye 4 (four) times daily.     . Multiple Vitamin (MULTIVITAMIN WITH MINERALS) TABS tablet Take 1 tablet by mouth daily.    . prednisoLONE acetate (PRED FORTE) 1 % ophthalmic suspension Place 1 drop into the right eye 4 (four) times daily.     . prochlorperazine (COMPAZINE) 10 MG tablet Take 1 tablet (10 mg total) by mouth every 6 (six) hours as needed for nausea or vomiting. 30 tablet 2  . sucralfate (CARAFATE) 1 g tablet TAKE 1 TABLET BY MOUTH 4 TIMES DAILY AS NEEDED CRUSH 1 TABLET IN WATER AND TAKE 5 MINUTES BEFORE MEALS    .  triamcinolone cream (KENALOG) 0.5 % Apply topically 2 (two) times daily as needed. (Patient taking differently: Apply 1 application topically 2 (two) times daily as needed (skin irritation/rash). ) 30 g 1  . XARELTO 20 MG TABS tablet TAKE 1 TABLET BY MOUTH  DAILY WITH SUPPER (Patient taking differently: Take 20 mg by mouth daily. ) 90 tablet 3  . methylPREDNISolone (MEDROL DOSEPAK) 4 MG TBPK tablet Use as instructed 21 tablet 0    No current facility-administered medications for this visit.     SURGICAL HISTORY:  Past Surgical History:  Procedure Laterality Date  . CATARACT EXTRACTION W/ INTRAOCULAR LENS IMPLANT     right  . COLONOSCOPY    . IR IMAGING GUIDED PORT INSERTION  01/31/2019  . RADIOFREQUENCY ABLATION     x2 for fib, on 2nd attempt switched form a flutter ot a fib.   Marland Kitchen ROTATOR CUFF REPAIR Right   . TONSILLECTOMY    . VIDEO BRONCHOSCOPY WITH ENDOBRONCHIAL ULTRASOUND Left 12/30/2018   Procedure: VIDEO BRONCHOSCOPY WITH ENDOBRONCHIAL ULTRASOUND WITH FLUOROSCOPY;  Surgeon: Laurin Coder, MD;  Location: [facility];  Service: Thoracic;  Laterality: Left;  . WISDOM TOOTH EXTRACTION      REVIEW OF SYSTEMS:   Review of Systems  Constitutional: Negative for appetite change, chills, fatigue, fever and unexpected weight change.  HENT: Positive for intermittent nosebleeds recently cauterized. Negative for mouth sores, sore throat and trouble swallowing.   Eyes: Negative for eye problems and icterus.  Respiratory: Positive for chronic cough. Negative for hemoptysis, shortness of breath and wheezing.   Cardiovascular: Negative for chest pain and leg swelling.  Gastrointestinal: Positive for mild constipation. Negative for abdominal pain, diarrhea, nausea and vomiting.  Genitourinary: Negative for bladder incontinence, difficulty urinating, dysuria, frequency and hematuria.   Musculoskeletal: Negative for back pain, gait problem, neck pain and neck stiffness.  Skin: Negative for itching and rash.  Neurological: Negative for dizziness, extremity weakness, gait problem, headaches, light-headedness and seizures.  Hematological: Negative for adenopathy. Does not bruise/bleed easily.  Psychiatric/Behavioral: Negative for confusion, depression and sleep disturbance. The patient is not nervous/anxious.      PHYSICAL EXAMINATION:  Blood pressure 103/73, pulse 76, temperature 98.2 F (36.8 C), temperature source  Temporal, resp. rate 18, height '5\' 10"'  (1.778 m), weight 191 lb 8 oz (86.9 kg), SpO2 100 %.  ECOG PERFORMANCE STATUS: 1 - Symptomatic but completely ambulatory  Physical Exam  Constitutional: Oriented to person, place, and time and well-developed, well-nourished, and in no distress.  HENT:  Head: Normocephalic and atraumatic.  Mouth/Throat: Oropharynx is clear and moist. No oropharyngeal exudate.  Eyes: Conjunctivae are normal. Right eye exhibits no discharge. Left eye exhibits no discharge. No scleral icterus.  Neck: Normal range of motion. Neck supple.  Cardiovascular: Normal rate, irregular rhythm,, normal heart sounds and intact distal pulses.   Pulmonary/Chest: Effort normal and breath sounds normal. No respiratory distress. No wheezes. No rales.  Abdominal: Soft. Bowel sounds are normal. Exhibits no distension and no mass. There is no tenderness.  Musculoskeletal: Normal range of motion. Exhibits no edema.  Lymphadenopathy:    No cervical adenopathy.  Neurological: Alert and oriented to person, place, and time. Exhibits normal muscle tone. Gait normal. Coordination normal.  Skin: Skin is warm and dry. No rash noted. Not diaphoretic. No erythema. No pallor.  Psychiatric: Mood, memory and judgment normal.  Vitals reviewed.  LABORATORY DATA: Lab Results  Component Value Date   WBC 2.1 (L) 02/20/2019   HGB 11.2 (L) 02/20/2019  HCT 33.6 (L) 02/20/2019   MCV 90.6 02/20/2019   PLT 51 (L) 02/20/2019      Chemistry      Component Value Date/Time   NA 138 02/20/2019 1037   K 4.4 02/20/2019 1037   CL 106 02/20/2019 1037   CO2 24 02/20/2019 1037   BUN 10 02/20/2019 1037   CREATININE 0.78 02/20/2019 1037      Component Value Date/Time   CALCIUM 8.4 (L) 02/20/2019 1037   ALKPHOS 77 02/20/2019 1037   AST 26 02/20/2019 1037   ALT 27 02/20/2019 1037   BILITOT 0.5 02/20/2019 1037       RADIOGRAPHIC STUDIES:  Ir Imaging Guided Port Insertion  Result Date:  01/31/2019 INDICATION: History of lung cancer. In need of durable intravenous access for chemotherapy administration EXAM: IMPLANTED PORT A CATH PLACEMENT WITH ULTRASOUND AND FLUOROSCOPIC GUIDANCE COMPARISON:  PET-CT-12/02/2018; chest CT-11/21/2018 MEDICATIONS: Ancef 2 gm IV; The antibiotic was administered within an appropriate time interval prior to skin puncture. ANESTHESIA/SEDATION: Moderate (conscious) sedation was employed during this procedure. A total of Versed 2 mg and Fentanyl 50 mcg was administered intravenously. Moderate Sedation Time: 24 minutes. The patient's level of consciousness and vital signs were monitored continuously by radiology nursing throughout the procedure under my direct supervision. CONTRAST:  None FLUOROSCOPY TIME:  24 seconds (7 mGy) COMPLICATIONS: None immediate. PROCEDURE: The procedure, risks, benefits, and alternatives were explained to the patient. Questions regarding the procedure were encouraged and answered. The patient understands and consents to the procedure. The right neck and chest were prepped with chlorhexidine in a sterile fashion, and a sterile drape was applied covering the operative field. Maximum barrier sterile technique with sterile gowns and gloves were used for the procedure. A timeout was performed prior to the initiation of the procedure. Local anesthesia was provided with 1% lidocaine with epinephrine. After creating a small venotomy incision, a micropuncture kit was utilized to access the internal jugular vein. Real-time ultrasound guidance was utilized for vascular access including the acquisition of a permanent ultrasound image documenting patency of the accessed vessel. The microwire was utilized to measure appropriate catheter length. A subcutaneous port pocket was then created along the upper chest wall utilizing a combination of sharp and blunt dissection. The pocket was irrigated with sterile saline. A single lumen slim power injectable port was  chosen for placement. The 8 Fr catheter was tunneled from the port pocket site to the venotomy incision. The port was placed in the pocket. The external catheter was trimmed to appropriate length. At the venotomy, an 8 Fr peel-away sheath was placed over a guidewire under fluoroscopic guidance. The catheter was then placed through the sheath and the sheath was removed. Final catheter positioning was confirmed and documented with a fluoroscopic spot radiograph. The port was accessed with a Huber needle, aspirated and flushed with heparinized saline. The venotomy site was closed with an interrupted 4-0 Vicryl suture. The port pocket incision was closed with interrupted 2-0 Vicryl suture and the skin was opposed with a running subcuticular 4-0 Vicryl suture. Dermabond and Steri-strips were applied to both incisions. Dressings were placed. The patient tolerated the procedure well without immediate post procedural complication. FINDINGS: After catheter placement, the tip lies within the superior cavoatrial junction. The catheter aspirates and flushes normally and is ready for immediate use. IMPRESSION: Successful placement of a right internal jugular approach power injectable Port-A-Cath. The catheter is ready for immediate use. Electronically Signed   By: Sandi Mariscal M.D.   On:  01/31/2019 15:52     ASSESSMENT/PLAN:  This is a very pleasant 78 year old Caucasian male diagnosed with stage IIIb (T3, N2, M0) non-small cell lung cancer, squamous cell carcinoma. He presented with a right lower lobe lung mass in addition to subcarinal lymphadenopathy. He was diagnosed in October 2020. He is negative for any actionable mutations.  He is currently undergoing concurrent chemoradiation with carboplatin for an AUC of 2 and paclitaxel 45 mg/m. He is status post 4 cycles. Chemotherapy was held last week secondary to thrombocytopenia. His last day of radiation is scheduled for 03/03/2019.  Labs were reviewed today. His  labs show neutropenia with an ANC of 1.0 and thrombocytopenia with a platelet count of 51,000. The patient will not receive treatment today due to his labs.   I will send a medrol dosepak to the patient's pharmacy for his thrombocytopenia.   The patient will receive cycle #5 next week if his lab work is adequate for treatment.   I will arrange for the patient to have a restaging CT scan of the chest in 4 weeks. We will see him back in about 5 weeks for evaluation and to review his CT scan.   The patient was advised to avoid OTC pain mediations and herbal supplements due to his thrombocytopenia.   The patient was advised to call immediately if he has any concerning symptoms in the interval. The patient voices understanding of current disease status and treatment options and is in agreement with the current care plan. All questions were answered. The patient knows to call the clinic with any problems, questions or concerns. We can certainly see the patient much sooner if necessary   Orders Placed This Encounter  Procedures  . CT Chest W Contrast    Standing Status:   Future    Standing Expiration Date:   02/20/2020    Order Specific Question:   ** REASON FOR EXAM (FREE TEXT)    Answer:   restaging lung cancer    Order Specific Question:   If indicated for the ordered procedure, I authorize the administration of contrast media per Radiology protocol    Answer:   Yes    Order Specific Question:   Preferred imaging location?    Answer:   [facility]    Order Specific Question:   Radiology Contrast Protocol - do NOT remove file path    Answer:   [email or web address]  . CBC with Differential ([facility] Only)    Standing Status:   Future    Standing Expiration Date:   02/20/2020  . CMP ([facility] only)    Standing Status:   Future    Standing Expiration Date:   02/20/2020      L , PA-C 02/20/19

## 2019-02-20 NOTE — Patient Instructions (Signed)

## 2019-02-21 ENCOUNTER — Other Ambulatory Visit: Payer: Self-pay

## 2019-02-21 ENCOUNTER — Ambulatory Visit
Admission: RE | Admit: 2019-02-21 | Discharge: 2019-02-21 | Disposition: A | Discharge status: Home / Self Care | Payer: Medicare Other | Source: Ambulatory Visit | Attending: Radiation Oncology | Admitting: Radiation Oncology

## 2019-02-21 DIAGNOSIS — C3431 Malignant neoplasm of lower lobe, right bronchus or lung: Secondary | ICD-10-CM | POA: Diagnosis not present

## 2019-02-21 DIAGNOSIS — Z510 Encounter for antineoplastic radiation therapy: Secondary | ICD-10-CM | POA: Diagnosis not present

## 2019-02-21 MED ORDER — ATENOLOL 50 MG PO TABS: 50.0000 mg | ORAL_TABLET | Freq: Two times a day (BID) | ORAL | 3 refills | Status: DC

## 2019-02-22 ENCOUNTER — Other Ambulatory Visit: Payer: Self-pay

## 2019-02-22 ENCOUNTER — Ambulatory Visit
Admission: RE | Admit: 2019-02-22 | Discharge: 2019-02-22 | Disposition: A | Discharge status: Home / Self Care | Payer: Medicare Other | Source: Ambulatory Visit | Attending: Radiation Oncology | Admitting: Radiation Oncology

## 2019-02-22 DIAGNOSIS — Z510 Encounter for antineoplastic radiation therapy: Secondary | ICD-10-CM | POA: Diagnosis not present

## 2019-02-22 DIAGNOSIS — C3431 Malignant neoplasm of lower lobe, right bronchus or lung: Secondary | ICD-10-CM | POA: Diagnosis not present

## 2019-02-27 ENCOUNTER — Inpatient Hospital Stay: Payer: Medicare Other

## 2019-02-27 ENCOUNTER — Ambulatory Visit
Admission: RE | Admit: 2019-02-27 | Discharge: 2019-02-27 | Disposition: A | Discharge status: Home / Self Care | Payer: Medicare Other | Source: Ambulatory Visit | Attending: Radiation Oncology | Admitting: Radiation Oncology

## 2019-02-27 ENCOUNTER — Other Ambulatory Visit: Payer: Self-pay

## 2019-02-27 VITALS — BP 123/75 | HR 74 | Temp 97.8°F | Resp 18

## 2019-02-27 DIAGNOSIS — R040 Epistaxis: Secondary | ICD-10-CM | POA: Diagnosis not present

## 2019-02-27 DIAGNOSIS — Z7901 Long term (current) use of anticoagulants: Secondary | ICD-10-CM | POA: Diagnosis not present

## 2019-02-27 DIAGNOSIS — C3491 Malignant neoplasm of unspecified part of right bronchus or lung: Secondary | ICD-10-CM

## 2019-02-27 DIAGNOSIS — T451X5A Adverse effect of antineoplastic and immunosuppressive drugs, initial encounter: Secondary | ICD-10-CM | POA: Diagnosis not present

## 2019-02-27 DIAGNOSIS — Z79899 Other long term (current) drug therapy: Secondary | ICD-10-CM | POA: Diagnosis not present

## 2019-02-27 DIAGNOSIS — Z5111 Encounter for antineoplastic chemotherapy: Secondary | ICD-10-CM | POA: Diagnosis not present

## 2019-02-27 DIAGNOSIS — Z510 Encounter for antineoplastic radiation therapy: Secondary | ICD-10-CM | POA: Diagnosis not present

## 2019-02-27 DIAGNOSIS — E785 Hyperlipidemia, unspecified: Secondary | ICD-10-CM | POA: Diagnosis not present

## 2019-02-27 DIAGNOSIS — I10 Essential (primary) hypertension: Secondary | ICD-10-CM | POA: Diagnosis not present

## 2019-02-27 DIAGNOSIS — K5900 Constipation, unspecified: Secondary | ICD-10-CM | POA: Diagnosis not present

## 2019-02-27 DIAGNOSIS — C3431 Malignant neoplasm of lower lobe, right bronchus or lung: Secondary | ICD-10-CM | POA: Diagnosis not present

## 2019-02-27 DIAGNOSIS — I4891 Unspecified atrial fibrillation: Secondary | ICD-10-CM | POA: Diagnosis not present

## 2019-02-27 DIAGNOSIS — R05 Cough: Secondary | ICD-10-CM | POA: Diagnosis not present

## 2019-02-27 DIAGNOSIS — D6959 Other secondary thrombocytopenia: Secondary | ICD-10-CM | POA: Diagnosis not present

## 2019-02-27 LAB — CBC WITH DIFFERENTIAL (CANCER CENTER ONLY)
Abs Immature Granulocytes: 0.11 10*3/uL — ABNORMAL HIGH (ref 0.00–0.07)
Basophils Absolute: 0 10*3/uL (ref 0.0–0.1)
Basophils Relative: 0 %
Eosinophils Absolute: 0 10*3/uL (ref 0.0–0.5)
Eosinophils Relative: 0 %
HCT: 40.1 % (ref 39.0–52.0)
Hemoglobin: 13.1 g/dL (ref 13.0–17.0)
Immature Granulocytes: 2 %
Lymphocytes Relative: 16 %
Lymphs Abs: 0.7 10*3/uL (ref 0.7–4.0)
MCH: 31.1 pg (ref 26.0–34.0)
MCHC: 32.7 g/dL (ref 30.0–36.0)
MCV: 95.2 fL (ref 80.0–100.0)
Monocytes Absolute: 0.7 10*3/uL (ref 0.1–1.0)
Monocytes Relative: 16 %
Neutro Abs: 2.9 10*3/uL (ref 1.7–7.7)
Neutrophils Relative %: 66 %
Platelet Count: 89 10*3/uL — ABNORMAL LOW (ref 150–400)
RBC: 4.21 MIL/uL — ABNORMAL LOW (ref 4.22–5.81)
RDW: 19.9 % — ABNORMAL HIGH (ref 11.5–15.5)
WBC Count: 4.5 10*3/uL (ref 4.0–10.5)
nRBC: 0 % (ref 0.0–0.2)

## 2019-02-27 LAB — CMP (CANCER CENTER ONLY)
ALT: 23 U/L (ref 0–44)
AST: 20 U/L (ref 15–41)
Albumin: 3.5 g/dL (ref 3.5–5.0)
Alkaline Phosphatase: 70 U/L (ref 38–126)
Anion gap: 9 (ref 5–15)
BUN: 18 mg/dL (ref 8–23)
CO2: 25 mmol/L (ref 22–32)
Calcium: 8.5 mg/dL — ABNORMAL LOW (ref 8.9–10.3)
Chloride: 104 mmol/L (ref 98–111)
Creatinine: 0.8 mg/dL (ref 0.61–1.24)
GFR, Est AFR Am: >60 mL/min (ref >60)
GFR, Estimated: >60 mL/min (ref >60)
Glucose, Bld: 104 mg/dL — ABNORMAL HIGH (ref 70–99)
Potassium: 4.6 mmol/L (ref 3.5–5.1)
Sodium: 138 mmol/L (ref 135–145)
Total Bilirubin: 0.5 mg/dL (ref 0.3–1.2)
Total Protein: 6.4 g/dL — ABNORMAL LOW (ref 6.5–8.1)

## 2019-02-27 MED ORDER — DIPHENHYDRAMINE HCL 50 MG/ML IJ SOLN
50.0000 mg | Freq: Once | INTRAMUSCULAR | Status: AC
  Administered 2019-02-27: 50 mg via INTRAVENOUS

## 2019-02-27 MED ORDER — SODIUM CHLORIDE 0.9 % IV SOLN
45.0000 mg/m2 | Freq: Once | INTRAVENOUS | Status: AC
  Administered 2019-02-27: 13:00:00 96 mg via INTRAVENOUS
  Filled 2019-02-27: qty 16

## 2019-02-27 MED ORDER — SODIUM CHLORIDE 0.9 % IV SOLN
205.4000 mg | Freq: Once | INTRAVENOUS | Status: AC
  Administered 2019-02-27: 210 mg via INTRAVENOUS
  Filled 2019-02-27: qty 21

## 2019-02-27 MED ORDER — SODIUM CHLORIDE 0.9 % IV SOLN
20.0000 mg | Freq: Once | INTRAVENOUS | Status: AC
  Administered 2019-02-27: 20 mg via INTRAVENOUS
  Filled 2019-02-27: qty 2

## 2019-02-27 MED ORDER — SODIUM CHLORIDE 0.9 % IV SOLN
Freq: Once | INTRAVENOUS | Status: AC
  Administered 2019-02-27: 12:00:00 via INTRAVENOUS
  Filled 2019-02-27: qty 250

## 2019-02-27 MED ORDER — FAMOTIDINE IN NACL 20-0.9 MG/50ML-% IV SOLN
INTRAVENOUS | Status: AC
  Filled 2019-02-27: qty 50

## 2019-02-27 MED ORDER — PALONOSETRON HCL INJECTION 0.25 MG/5ML
INTRAVENOUS | Status: AC
  Filled 2019-02-27: qty 5

## 2019-02-27 MED ORDER — DIPHENHYDRAMINE HCL 50 MG/ML IJ SOLN
INTRAMUSCULAR | Status: AC
  Filled 2019-02-27: qty 1

## 2019-02-27 MED ORDER — SODIUM CHLORIDE 0.9% FLUSH
10.0000 mL | INTRAVENOUS | Status: DC | PRN
  Administered 2019-02-27: 10 mL
  Filled 2019-02-27: qty 10

## 2019-02-27 MED ORDER — HEPARIN SOD (PORK) LOCK FLUSH 100 UNIT/ML IV SOLN
500.0000 [IU] | Freq: Once | INTRAVENOUS | Status: AC | PRN
  Administered 2019-02-27: 500 [IU]
  Filled 2019-02-27: qty 5

## 2019-02-27 MED ORDER — PALONOSETRON HCL INJECTION 0.25 MG/5ML
0.2500 mg | Freq: Once | INTRAVENOUS | Status: AC
  Administered 2019-02-27: 0.25 mg via INTRAVENOUS

## 2019-02-27 MED ORDER — FAMOTIDINE IN NACL 20-0.9 MG/50ML-% IV SOLN
20.0000 mg | Freq: Once | INTRAVENOUS | Status: AC
  Administered 2019-02-27: 20 mg via INTRAVENOUS

## 2019-02-27 NOTE — Progress Notes (Signed)
Per Dr. Julien Nordmann pt is ok to treat with today's labs

## 2019-02-27 NOTE — Patient Instructions (Signed)
[facility] Discharge Instructions for Patients Receiving Chemotherapy  Today you received the following chemotherapy agents Paclitaxel (TAXOL) & Carboplatin (PARAPLATIN).  To help prevent nausea and vomiting after your treatment, we encourage you to take your nausea medication as prescribed.   If you develop nausea and vomiting that is not controlled by your nausea medication, call the clinic.   BELOW ARE SYMPTOMS THAT SHOULD BE REPORTED IMMEDIATELY:  *FEVER GREATER THAN 100.5 F  *CHILLS WITH OR WITHOUT FEVER  NAUSEA AND VOMITING THAT IS NOT CONTROLLED WITH YOUR NAUSEA MEDICATION  *UNUSUAL SHORTNESS OF BREATH  *UNUSUAL BRUISING OR BLEEDING  TENDERNESS IN MOUTH AND THROAT WITH OR WITHOUT PRESENCE OF ULCERS  *URINARY PROBLEMS  *BOWEL PROBLEMS  UNUSUAL RASH Items with * indicate a potential emergency and should be followed up as soon as possible.  Feel free to call the clinic should you have any questions or concerns. The clinic phone number is [phone].  Please show the CHEMO ALERT CARD at check-in to the Emergency Department and triage nurse.  Coronavirus (COVID-19) Are you at risk?  Are you at risk for the Coronavirus (COVID-19)?  To be considered HIGH RISK for Coronavirus (COVID-19), you have to meet the following criteria:  . Traveled to [place], [place], [place], [place] or [place]; or in the [place] to [place], [place], [place], or [place]; and have fever, cough, and shortness of breath within the last 2 weeks of travel OR . Been in close contact with a person diagnosed with COVID-19 within the last 2 weeks and have fever, cough, and shortness of breath . IF YOU DO NOT MEET THESE CRITERIA, YOU ARE CONSIDERED LOW RISK FOR COVID-19.  What to do if you are HIGH RISK for COVID-19?  . If you are having a medical emergency, call 911. . Seek medical care right away. Before you go to a doctor's office, urgent care or emergency department,  call ahead and tell them about your recent travel, contact with someone diagnosed with COVID-19, and your symptoms. You should receive instructions from your physician's office regarding next steps of care.  . When you arrive at healthcare provider, tell the healthcare staff immediately you have returned from visiting [place], [place], [place], [place] or [place]; or traveled in the [place] to [place], [place], [place], or [place]; in the last two weeks or you have been in close contact with a person diagnosed with COVID-19 in the last 2 weeks.   . Tell the health care staff about your symptoms: fever, cough and shortness of breath. . After you have been seen by a medical provider, you will be either: o Tested for (COVID-19) and discharged home on quarantine except to seek medical care if symptoms worsen, and asked to  - Stay home and avoid contact with others until you get your results (4-5 days)  - Avoid travel on public transportation if possible (such as bus, train, or airplane) or o Sent to the Emergency Department by EMS for evaluation, COVID-19 testing, and possible admission depending on your condition and test results.  What to do if you are LOW RISK for COVID-19?  Reduce your risk of any infection by using the same precautions used for avoiding the common cold or flu:  . Wash your hands often with soap and warm water for at least 20 seconds.  If soap and water are not readily available, use an alcohol-based hand sanitizer with at least 60% alcohol.  .   If coughing or sneezing, cover your mouth and nose by coughing or sneezing into the elbow areas of your shirt or coat, into a tissue or into your sleeve (not your hands). . Avoid shaking hands with others and consider head nods or verbal greetings only. . Avoid touching your eyes, nose, or mouth with unwashed hands.  . Avoid close contact with people who are sick. . Avoid places or events with large numbers of people in one  location, like concerts or sporting events. . Carefully consider travel plans you have or are making. . If you are planning any travel outside or inside the [place], visit the CDC's Travelers' Health webpage for the latest health notices. . If you have some symptoms but not all symptoms, continue to monitor at home and seek medical attention if your symptoms worsen. . If you are having a medical emergency, call 911.   ADDITIONAL HEALTHCARE OPTIONS FOR PATIENTS  [facility] / e-Visit: [email or web address]         [facility]: [phone]  [facility]: [phone]                   [facility]: [phone]   

## 2019-02-28 ENCOUNTER — Other Ambulatory Visit: Payer: Self-pay

## 2019-02-28 ENCOUNTER — Ambulatory Visit: Payer: Medicare Other

## 2019-02-28 ENCOUNTER — Ambulatory Visit
Admission: RE | Admit: 2019-02-28 | Discharge: 2019-02-28 | Disposition: A | Discharge status: Home / Self Care | Payer: Medicare Other | Source: Ambulatory Visit | Attending: Radiation Oncology | Admitting: Radiation Oncology

## 2019-02-28 DIAGNOSIS — C3431 Malignant neoplasm of lower lobe, right bronchus or lung: Secondary | ICD-10-CM | POA: Insufficient documentation

## 2019-02-28 DIAGNOSIS — Z510 Encounter for antineoplastic radiation therapy: Secondary | ICD-10-CM | POA: Insufficient documentation

## 2019-03-01 ENCOUNTER — Other Ambulatory Visit: Payer: Self-pay

## 2019-03-01 ENCOUNTER — Ambulatory Visit
Admission: RE | Admit: 2019-03-01 | Discharge: 2019-03-01 | Disposition: A | Discharge status: Home / Self Care | Payer: Medicare Other | Source: Ambulatory Visit | Attending: Radiation Oncology | Admitting: Radiation Oncology

## 2019-03-01 DIAGNOSIS — C3431 Malignant neoplasm of lower lobe, right bronchus or lung: Secondary | ICD-10-CM | POA: Diagnosis not present

## 2019-03-01 DIAGNOSIS — Z510 Encounter for antineoplastic radiation therapy: Secondary | ICD-10-CM | POA: Diagnosis not present

## 2019-03-02 ENCOUNTER — Other Ambulatory Visit: Payer: Self-pay

## 2019-03-02 ENCOUNTER — Ambulatory Visit
Admission: RE | Admit: 2019-03-02 | Discharge: 2019-03-02 | Disposition: A | Discharge status: Home / Self Care | Payer: Medicare Other | Source: Ambulatory Visit | Attending: Radiation Oncology | Admitting: Radiation Oncology

## 2019-03-02 DIAGNOSIS — C3431 Malignant neoplasm of lower lobe, right bronchus or lung: Secondary | ICD-10-CM | POA: Diagnosis not present

## 2019-03-02 DIAGNOSIS — Z510 Encounter for antineoplastic radiation therapy: Secondary | ICD-10-CM | POA: Diagnosis not present

## 2019-03-03 ENCOUNTER — Encounter: Payer: Self-pay | Admitting: Radiation Oncology

## 2019-03-03 ENCOUNTER — Other Ambulatory Visit: Payer: Self-pay

## 2019-03-03 ENCOUNTER — Ambulatory Visit
Admission: RE | Admit: 2019-03-03 | Discharge: 2019-03-03 | Disposition: A | Discharge status: Home / Self Care | Payer: Medicare Other | Source: Ambulatory Visit | Attending: Radiation Oncology | Admitting: Radiation Oncology

## 2019-03-03 DIAGNOSIS — C3431 Malignant neoplasm of lower lobe, right bronchus or lung: Secondary | ICD-10-CM | POA: Diagnosis not present

## 2019-03-03 DIAGNOSIS — Z510 Encounter for antineoplastic radiation therapy: Secondary | ICD-10-CM | POA: Diagnosis not present

## 2019-03-08 ENCOUNTER — Encounter: Payer: Self-pay | Admitting: *Deleted

## 2019-03-19 NOTE — Progress Notes (Signed)
  Radiation Oncology         [phone] ________________________________  Name: Martin Bailey MRN: [id number]  Date: 03/03/2019  DOB: 31-Jul-1940   End of Treatment Note  Diagnosis:   78 y.o. gentleman with stage IIIb (T3, N2, M0) squamous cell carcinoma presented of the right lower lung     Indication for treatment:  Curative, Chemo-Radiotherapy       Radiation treatment dates:   10/19-12/4/20  Site/dose:   The primary tumor and involved mediastinal adenopathy were treated to 66 Gy in 33 fractions of 2 Gy.  Beams/energy:   A five field 3D conformal treatment arrangement was used delivering 6 and 10 MV photons.  Daily image-guidance CT was used to align the treatment with the targeted volume  Narrative: The patient tolerated radiation treatment relatively well.  The patient experienced some esophagitis characterized as mild.  The patient also noted fatigue.  Plan: The patient has completed radiation treatment. The patient will return to radiation oncology clinic for routine followup in one month. I advised him to call or return sooner if he has any questions or concerns related to his recovery or treatment.  ________________________________  Sheral Apley. Tammi Klippel, M.D.

## 2019-03-22 ENCOUNTER — Ambulatory Visit (HOSPITAL_COMMUNITY)
Admission: RE | Admit: 2019-03-22 | Discharge: 2019-03-22 | Disposition: A | Discharge status: Home / Self Care | Payer: Medicare Other | Source: Ambulatory Visit | Attending: Physician Assistant | Admitting: Physician Assistant

## 2019-03-22 ENCOUNTER — Other Ambulatory Visit: Payer: Self-pay

## 2019-03-22 ENCOUNTER — Inpatient Hospital Stay: Payer: Medicare Other | Attending: Internal Medicine

## 2019-03-22 DIAGNOSIS — Z7952 Long term (current) use of systemic steroids: Secondary | ICD-10-CM | POA: Diagnosis not present

## 2019-03-22 DIAGNOSIS — C3431 Malignant neoplasm of lower lobe, right bronchus or lung: Secondary | ICD-10-CM | POA: Insufficient documentation

## 2019-03-22 DIAGNOSIS — Z7901 Long term (current) use of anticoagulants: Secondary | ICD-10-CM | POA: Diagnosis not present

## 2019-03-22 DIAGNOSIS — E785 Hyperlipidemia, unspecified: Secondary | ICD-10-CM | POA: Insufficient documentation

## 2019-03-22 DIAGNOSIS — C3491 Malignant neoplasm of unspecified part of right bronchus or lung: Secondary | ICD-10-CM

## 2019-03-22 DIAGNOSIS — I10 Essential (primary) hypertension: Secondary | ICD-10-CM | POA: Insufficient documentation

## 2019-03-22 DIAGNOSIS — Z79899 Other long term (current) drug therapy: Secondary | ICD-10-CM | POA: Insufficient documentation

## 2019-03-22 LAB — CBC WITH DIFFERENTIAL (CANCER CENTER ONLY)
Abs Immature Granulocytes: 0.02 10*3/uL (ref 0.00–0.07)
Basophils Absolute: 0 10*3/uL (ref 0.0–0.1)
Basophils Relative: 1 %
Eosinophils Absolute: 0 10*3/uL (ref 0.0–0.5)
Eosinophils Relative: 1 %
HCT: 36.7 % — ABNORMAL LOW (ref 39.0–52.0)
Hemoglobin: 12.2 g/dL — ABNORMAL LOW (ref 13.0–17.0)
Immature Granulocytes: 1 %
Lymphocytes Relative: 35 %
Lymphs Abs: 1.6 10*3/uL (ref 0.7–4.0)
MCH: 32.3 pg (ref 26.0–34.0)
MCHC: 33.2 g/dL (ref 30.0–36.0)
MCV: 97.1 fL (ref 80.0–100.0)
Monocytes Absolute: 0.8 10*3/uL (ref 0.1–1.0)
Monocytes Relative: 19 %
Neutro Abs: 2 10*3/uL (ref 1.7–7.7)
Neutrophils Relative %: 43 %
Platelet Count: 96 10*3/uL — ABNORMAL LOW (ref 150–400)
RBC: 3.78 MIL/uL — ABNORMAL LOW (ref 4.22–5.81)
RDW: 19.6 % — ABNORMAL HIGH (ref 11.5–15.5)
WBC Count: 4.4 10*3/uL (ref 4.0–10.5)
nRBC: 0 % (ref 0.0–0.2)

## 2019-03-22 LAB — CMP (CANCER CENTER ONLY)
ALT: 23 U/L (ref 0–44)
AST: 30 U/L (ref 15–41)
Albumin: 3.3 g/dL — ABNORMAL LOW (ref 3.5–5.0)
Alkaline Phosphatase: 89 U/L (ref 38–126)
Anion gap: 9 (ref 5–15)
BUN: 8 mg/dL (ref 8–23)
CO2: 26 mmol/L (ref 22–32)
Calcium: 8.6 mg/dL — ABNORMAL LOW (ref 8.9–10.3)
Chloride: 105 mmol/L (ref 98–111)
Creatinine: 0.81 mg/dL (ref 0.61–1.24)
GFR, Est AFR Am: >60 mL/min (ref >60)
GFR, Estimated: >60 mL/min (ref >60)
Glucose, Bld: 96 mg/dL (ref 70–99)
Potassium: 4.6 mmol/L (ref 3.5–5.1)
Sodium: 140 mmol/L (ref 135–145)
Total Bilirubin: 0.5 mg/dL (ref 0.3–1.2)
Total Protein: 6.5 g/dL (ref 6.5–8.1)

## 2019-03-22 MED ORDER — SODIUM CHLORIDE (PF) 0.9 % IJ SOLN
INTRAMUSCULAR | Status: AC
  Filled 2019-03-22: qty 50

## 2019-03-22 MED ORDER — HEPARIN SOD (PORK) LOCK FLUSH 100 UNIT/ML IV SOLN
500.0000 [IU] | Freq: Once | INTRAVENOUS | Status: AC
  Administered 2019-03-22: 500 [IU] via INTRAVENOUS

## 2019-03-22 MED ORDER — HEPARIN SOD (PORK) LOCK FLUSH 100 UNIT/ML IV SOLN
INTRAVENOUS | Status: AC
  Filled 2019-03-22: qty 5

## 2019-03-22 MED ORDER — IOHEXOL 300 MG/ML  SOLN
75.0000 mL | Freq: Once | INTRAMUSCULAR | Status: AC | PRN
  Administered 2019-03-22: 75 mL via INTRAVENOUS

## 2019-03-29 ENCOUNTER — Inpatient Hospital Stay (HOSPITAL_BASED_OUTPATIENT_CLINIC_OR_DEPARTMENT_OTHER): Payer: Medicare Other | Admitting: Internal Medicine

## 2019-03-29 ENCOUNTER — Encounter: Payer: Self-pay | Admitting: Internal Medicine

## 2019-03-29 ENCOUNTER — Other Ambulatory Visit: Payer: Self-pay

## 2019-03-29 VITALS — BP 113/81 | HR 85 | Temp 98.1°F | Resp 18 | Ht 70.0 in | Wt 196.6 lb

## 2019-03-29 DIAGNOSIS — I10 Essential (primary) hypertension: Secondary | ICD-10-CM

## 2019-03-29 DIAGNOSIS — Z5112 Encounter for antineoplastic immunotherapy: Secondary | ICD-10-CM | POA: Insufficient documentation

## 2019-03-29 DIAGNOSIS — C3491 Malignant neoplasm of unspecified part of right bronchus or lung: Secondary | ICD-10-CM | POA: Diagnosis not present

## 2019-03-29 DIAGNOSIS — Z7952 Long term (current) use of systemic steroids: Secondary | ICD-10-CM | POA: Diagnosis not present

## 2019-03-29 DIAGNOSIS — Z7189 Other specified counseling: Secondary | ICD-10-CM | POA: Diagnosis not present

## 2019-03-29 DIAGNOSIS — E785 Hyperlipidemia, unspecified: Secondary | ICD-10-CM | POA: Diagnosis not present

## 2019-03-29 DIAGNOSIS — Z7901 Long term (current) use of anticoagulants: Secondary | ICD-10-CM | POA: Diagnosis not present

## 2019-03-29 DIAGNOSIS — Z79899 Other long term (current) drug therapy: Secondary | ICD-10-CM | POA: Diagnosis not present

## 2019-03-29 DIAGNOSIS — C3431 Malignant neoplasm of lower lobe, right bronchus or lung: Secondary | ICD-10-CM | POA: Diagnosis not present

## 2019-03-29 NOTE — Progress Notes (Signed)
DISCONTINUE ON PATHWAY REGIMEN - Non-Small Cell Lung     Administer weekly:     Paclitaxel      Carboplatin   **Always confirm dose/schedule in your pharmacy ordering system**  REASON: Continuation Of Treatment PRIOR TREATMENT: [id number]: Carboplatin AUC=2 + Paclitaxel 45 mg/m2 Weekly During Radiation TREATMENT RESPONSE: Partial Response (PR)  START ON PATHWAY REGIMEN - Non-Small Cell Lung     A cycle is every 14 days:     Durvalumab   **Always confirm dose/schedule in your pharmacy ordering system**  Patient Characteristics: Stage III - Unresectable, PS = 0, 1 AJCC T Category: T3 Current Disease Status: No Distant Mets or Local Recurrence AJCC N Category: N2 AJCC M Category: M0 AJCC 8 Stage Grouping: IIIB ECOG Performance Status: 1 Intent of Therapy: Curative Intent, Discussed with Patient 

## 2019-03-29 NOTE — Progress Notes (Signed)
[facility] [phone]   [phone]  OFFICE PROGRESS NOTE  [name], MD [place] [address]  DIAGNOSIS: Stage IIIb (T3, N2, M0) non-small cell lung cancer, squamous cell carcinoma presented with right lower lobe lung mass in addition to subcarinal lymphadenopathy diagnosed in October 2020.  PRIOR THERAPY: Weekly concurrent chemoradiation with Carboplatin for an AUC of 2 Paclitaxel 45 mg/m2. First dose 01/16/2019. Status post5cycles.  Last dose was given February 13, 2019.  CURRENT THERAPY:  Consolidation treatment with immunotherapy with Imfinzi 1500 mg IV every 4 weeks.  First dose April 05, 2019.  INTERVAL HISTORY: Martin Bailey 78 y.o. male returns to the clinic today for follow-up visit.  The patient is feeling fine today with no concerning complaints.  He tolerated the previous course of concurrent chemoradiation fairly well except for thrombocytopenia that is improving.  The patient denied having any chest pain, shortness of breath, cough or hemoptysis.  He denied having any recent weight loss or night sweats.  He has no nausea, vomiting, diarrhea or constipation.  He has no headache or visual changes.  The patient had repeat CT scan of the chest performed recently and he is here for evaluation and discussion of his discuss results.  MEDICAL HISTORY: Past Medical History:  Diagnosis Date  . Cataract    left  . COLONIC POLYPS, HX OF 04/05/2007   ONE ADENOMATOUS POLYP AND ONE HYPERPLASTIC POLYP  . DEPRESSION 03/04/2007  . DERMATITIS, SEBORRHEIC 07/19/2007  . FIBRILLATION, ATRIAL 03/04/2007  . Hearing aid worn    B/L  . HYPERLIPIDEMIA 03/04/2007  . HYPERTENSION 03/04/2007  . Kidney stones   . Lung cancer ([facility])    non small cell lung ca  . Lung mass    mediastinal adenopathy    ALLERGIES:  has No Known Allergies.  MEDICATIONS:  Current Outpatient Medications  Medication Sig Dispense Refill  .  amLODipine (NORVASC) 5 MG tablet TAKE 1 TABLET BY MOUTH  DAILY (Patient taking differently: Take 5 mg by mouth daily. ) 90 tablet 3  . atenolol (TENORMIN) 50 MG tablet Take 1 tablet (50 mg total) by mouth 2 (two) times daily. 180 tablet 3  . clobetasol (TEMOVATE) 0.05 % external solution Apply 1 application topically 2 (two) times daily. For seborrheic dermatitis- 7 days maximum (Patient taking differently: Apply 1 application topically 2 (two) times daily as needed (For seborrheic dermatitis). 7 days maximum) 50 mL 0  . lidocaine-prilocaine (EMLA) cream Apply 1 application topically as needed. 30 g 0  . methylPREDNISolone (MEDROL DOSEPAK) 4 MG TBPK tablet Use as instructed 21 tablet 0  . moxifloxacin (VIGAMOX) 0.5 % ophthalmic solution Place 1 drop into the right eye 4 (four) times daily.     . Multiple Vitamin (MULTIVITAMIN WITH MINERALS) TABS tablet Take 1 tablet by mouth daily.    . prednisoLONE acetate (PRED FORTE) 1 % ophthalmic suspension Place 1 drop into the right eye 4 (four) times daily.     . prochlorperazine (COMPAZINE) 10 MG tablet Take 1 tablet (10 mg total) by mouth every 6 (six) hours as needed for nausea or vomiting. 30 tablet 2  . sucralfate (CARAFATE) 1 g tablet TAKE 1 TABLET BY MOUTH 4 TIMES DAILY AS NEEDED CRUSH 1 TABLET IN WATER AND TAKE 5 MINUTES BEFORE MEALS    . triamcinolone cream (KENALOG) 0.5 % Apply topically 2 (two) times daily as needed. (Patient taking differently: Apply 1 application topically 2 (two)  times daily as needed (skin irritation/rash). ) 30 g 1  . XARELTO 20 MG TABS tablet TAKE 1 TABLET BY MOUTH  DAILY WITH SUPPER (Patient taking differently: Take 20 mg by mouth daily. ) 90 tablet 3   No current facility-administered medications for this visit.    SURGICAL HISTORY:  Past Surgical History:  Procedure Laterality Date  . CATARACT EXTRACTION W/ INTRAOCULAR LENS IMPLANT     right  . COLONOSCOPY    . IR IMAGING GUIDED PORT INSERTION  01/31/2019  .  RADIOFREQUENCY ABLATION     x2 for fib, on 2nd attempt switched form a flutter ot a fib.   Marland Kitchen ROTATOR CUFF REPAIR Right   . TONSILLECTOMY    . VIDEO BRONCHOSCOPY WITH ENDOBRONCHIAL ULTRASOUND Left 12/30/2018   Procedure: VIDEO BRONCHOSCOPY WITH ENDOBRONCHIAL ULTRASOUND WITH FLUOROSCOPY;  Surgeon: Laurin Coder, MD;  Location: [facility];  Service: Thoracic;  Laterality: Left;  . WISDOM TOOTH EXTRACTION      REVIEW OF SYSTEMS:  Constitutional: negative Eyes: negative Ears, nose, mouth, throat, and face: negative Respiratory: negative Cardiovascular: negative Gastrointestinal: negative Genitourinary:negative Integument/breast: negative Hematologic/lymphatic: negative Musculoskeletal:negative Neurological: negative Behavioral/Psych: negative Endocrine: negative Allergic/Immunologic: negative   PHYSICAL EXAMINATION: General appearance: alert, cooperative and no distress Head: Normocephalic, without obvious abnormality, atraumatic Neck: no adenopathy, no JVD, supple, symmetrical, trachea midline and thyroid not enlarged, symmetric, no tenderness/mass/nodules Lymph nodes: Cervical, supraclavicular, and axillary nodes normal. Resp: clear to auscultation bilaterally Back: symmetric, no curvature. ROM normal. No CVA tenderness. Cardio: regular rate and rhythm, S1, S2 normal, no murmur, click, rub or gallop GI: soft, non-tender; bowel sounds normal; no masses,  no organomegaly Extremities: extremities normal, atraumatic, no cyanosis or edema Neurologic: Alert and oriented X 3, normal strength and tone. Normal symmetric reflexes. Normal coordination and gait  ECOG PERFORMANCE STATUS: 1 - Symptomatic but completely ambulatory  Blood pressure 113/81, pulse 85, temperature 98.1 F (36.7 C), temperature source Temporal, resp. rate 18, height 5\' 10"  (1.778 m), weight 196 lb 9.6 oz (89.2 kg), SpO2 100 %.  LABORATORY DATA: Lab Results  Component Value Date   WBC 4.4 03/22/2019   HGB 12.2 (L)  03/22/2019   HCT 36.7 (L) 03/22/2019   MCV 97.1 03/22/2019   PLT 96 (L) 03/22/2019      Chemistry      Component Value Date/Time   NA 140 03/22/2019 1057   K 4.6 03/22/2019 1057   CL 105 03/22/2019 1057   CO2 26 03/22/2019 1057   BUN 8 03/22/2019 1057   CREATININE 0.81 03/22/2019 1057      Component Value Date/Time   CALCIUM 8.6 (L) 03/22/2019 1057   ALKPHOS 89 03/22/2019 1057   AST 30 03/22/2019 1057   ALT 23 03/22/2019 1057   BILITOT 0.5 03/22/2019 1057       RADIOGRAPHIC STUDIES: CT Chest W Contrast  Result Date: 03/22/2019 CLINICAL DATA:  Stage IIIB right lower lobe squamous cell lung cancer diagnosed October 2020 status post concurrent chemotherapy and radiation therapy. Radiation therapy completed 03/03/2019. Restaging. EXAM: CT CHEST WITH CONTRAST TECHNIQUE: Multidetector CT imaging of the chest was performed during intravenous contrast administration. CONTRAST:  70mL OMNIPAQUE IOHEXOL 300 MG/ML  SOLN COMPARISON:  11/21/2018 chest CT.  12/02/2018 PET-CT. FINDINGS: Cardiovascular: Normal heart size. Stable small pericardial effusion/thickening. Left anterior descending and left circumflex coronary atherosclerosis. Atherosclerotic nonaneurysmal thoracic aorta. Normal caliber pulmonary arteries. No central pulmonary emboli. Right internal jugular Port-A-Cath terminates at the cavoatrial junction. Mediastinum/Nodes: No discrete thyroid nodules. Unremarkable esophagus.  No axillary adenopathy. Subcarinal short axis diameter 0.9 cm lymph node (series 2/image 77), decreased from 1.5 cm on 11/21/2018 chest CT using similar measurement technique. No pathologically enlarged mediastinal or hilar lymph nodes. Lungs/Pleura: No pneumothorax. Trace dependent right pleural effusion. No left pleural effusion. Mild centrilobular and paraseptal emphysema. Spiculated right lower lobe 4.6 x 4.2 cm lung mass (series 7/image 102), decreased from 5.8 x 5.8 cm using similar measurement technique. No new  significant pulmonary nodules. Upper abdomen: Subcentimeter hypodense left liver dome lesion is too small to characterize and is unchanged, presumably benign. Contracted gallbladder, nonspecific. Musculoskeletal: No aggressive appearing focal osseous lesions. Moderate thoracic spondylosis. IMPRESSION: 1. Partial treatment response. Right lower lobe spiculated lung mass is decreased. Subcarinal lymphadenopathy is decreased. No new or progressive metastatic disease. 2. Stable small pericardial effusion. Trace dependent right pleural effusion. Aortic Atherosclerosis (ICD10-I70.0) and Emphysema (ICD10-J43.9). Electronically Signed   By: Ilona Sorrel M.D.   On: 03/22/2019 18:12    ASSESSMENT AND PLAN: This is a very pleasant 78 years old white male with a stage IIIb non-small cell lung cancer, squamous cell carcinoma diagnosed in October 2020 status post course of concurrent chemoradiation with 5 cycles of chemotherapy with carboplatin and paclitaxel. The patient tolerated his treatment well except for pancytopenia. He had repeat CT scan of the chest performed recently.  I personally and independently reviewed the scan images and discussed the results with the patient and showed him the images. His scan showed partial response with decrease in the right lower lobe lung mass as well as the subcarinal lymphadenopathy. I discussed with the patient his treatment options including continuous observation and monitoring versus consideration of treatment with consolidation immunotherapy with Imfinzi 1500 mg IV every 4 weeks.  I discussed with the patient the progression free survival as well as overall survival with the consolidation treatment and he is interested in proceeding with the treatment.  I also discussed with him the adverse effect of the immunotherapy including but not limited to immunotherapy mediated skin rash, diarrhea, inflammation of the lung, kidney, liver, thyroid or other endocrine dysfunction. He is  expected to start the first cycle of this treatment on April 05, 2019. For hypertension, the patient will continue on his current blood pressure medication and monitor it closely at home. He will come back for follow-up visit in 5 weeks for evaluation with the start of cycle #2 of his consolidation immunotherapy. The patient was advised to call immediately if he has any other concerning symptoms in the interval. The patient voices understanding of current disease status and treatment options and is in agreement with the current care plan.  All questions were answered. The patient knows to call the clinic with any problems, questions or concerns. We can certainly see the patient much sooner if necessary.  I spent 25 minutes counseling the patient face to face. The total time spent in the appointment was 40 minutes.  Disclaimer: This note was dictated with voice recognition software. Similar sounding words can inadvertently be transcribed and may not be corrected upon review.

## 2019-03-30 ENCOUNTER — Telehealth: Payer: Self-pay | Admitting: Internal Medicine

## 2019-03-30 NOTE — Telephone Encounter (Signed)
Scheduled per los. Called and spoke with patient. Confirmed appts  

## 2019-04-05 ENCOUNTER — Encounter: Payer: Self-pay | Admitting: Urology

## 2019-04-05 ENCOUNTER — Ambulatory Visit
Admission: RE | Admit: 2019-04-05 | Discharge: 2019-04-05 | Disposition: A | Discharge status: Home / Self Care | Payer: Medicare Other | Source: Ambulatory Visit | Attending: Urology | Admitting: Urology

## 2019-04-05 ENCOUNTER — Other Ambulatory Visit: Payer: Self-pay

## 2019-04-05 DIAGNOSIS — C3491 Malignant neoplasm of unspecified part of right bronchus or lung: Secondary | ICD-10-CM

## 2019-04-05 DIAGNOSIS — C3431 Malignant neoplasm of lower lobe, right bronchus or lung: Secondary | ICD-10-CM

## 2019-04-05 NOTE — Addendum Note (Signed)
Encounter addended by: Freeman Caldron, PA-C on: 04/05/2019 2:08 PM  Actions taken: Level of Service modified

## 2019-04-05 NOTE — Progress Notes (Signed)
Radiation Oncology         [phone] ________________________________  Name: Martin Bailey MRN: [id number]  Date: 04/05/2019  DOB: [birth year]  Post Treatment Note  CC: [name], MD  [name], MD  Diagnosis:   79 y.o. gentleman with stage IIIb (T3, N2, M0) squamous cell carcinoma presented of the right lower lung    Interval Since Last Radiation:  4 weeks  10/19-12/4/20: The primary tumor and involved mediastinal adenopathy were treated to 66 Gy in 33 fractions of 2 Gy, concurrent with chemotherapy.  Narrative:  I spoke with the patient to conduct his routine scheduled 1 month follow up visit via telephone to spare the patient unnecessary potential exposure in the healthcare setting during the current COVID-19 pandemic.  The patient was notified in advance and gave permission to proceed with this visit format. He tolerated radiation treatment relatively well.  The patient experienced some esophagitis characterized as mild. The patient also noted fatigue.                              On review of systems, the patient states that he is doing very well overall.  He reports complete resolution of the dysphagia and has resumed eating and drinking as normal.  He reports a healthy appetite and is maintaining his weight.  He is pleased that his sense of taste has almost completely returned.  He denies chest pain, increased shortness of breath, productive cough, hemoptysis, fever, chills or night sweats.  He denies any abdominal pain, nausea, vomiting, diarrhea or constipation.  He completed concurrent chemotherapy with his last cycle of Carboplatin/Paclitaxel on 02/13/19. His recent restaging CT Chest shows a partial response to treatment with decreased size of the RLL lung mass as well as improved subcarinal LAN.  He was recently started on consolidation immunotherapy with Imfinzi, first dose scheduled for 04/06/19.  ALLERGIES:  has No Known Allergies.  Meds: Current Outpatient  Medications  Medication Sig Dispense Refill  . amLODipine (NORVASC) 5 MG tablet TAKE 1 TABLET BY MOUTH  DAILY (Patient taking differently: Take 5 mg by mouth daily. ) 90 tablet 3  . atenolol (TENORMIN) 50 MG tablet Take 1 tablet (50 mg total) by mouth 2 (two) times daily. 180 tablet 3  . lidocaine-prilocaine (EMLA) cream Apply 1 application topically as needed. 30 g 0  . Multiple Vitamin (MULTIVITAMIN WITH MINERALS) TABS tablet Take 1 tablet by mouth daily.    Marland Kitchen triamcinolone cream (KENALOG) 0.5 % Apply topically 2 (two) times daily as needed. (Patient taking differently: Apply 1 application topically 2 (two) times daily as needed (skin irritation/rash). ) 30 g 1  . XARELTO 20 MG TABS tablet TAKE 1 TABLET BY MOUTH  DAILY WITH SUPPER (Patient taking differently: Take 20 mg by mouth daily. ) 90 tablet 3  . clobetasol (TEMOVATE) 0.05 % external solution Apply 1 application topically 2 (two) times daily. For seborrheic dermatitis- 7 days maximum (Patient not taking: Reported on 04/05/2019) 50 mL 0  . moxifloxacin (VIGAMOX) 0.5 % ophthalmic solution Place 1 drop into the right eye 4 (four) times daily.     . prochlorperazine (COMPAZINE) 10 MG tablet Take 1 tablet (10 mg total) by mouth every 6 (six) hours as needed for nausea or vomiting. (Patient not taking: Reported on 04/05/2019) 30 tablet 2  . sucralfate (CARAFATE) 1 g tablet TAKE 1 TABLET BY MOUTH 4 TIMES DAILY AS NEEDED CRUSH 1 TABLET  IN WATER AND TAKE 5 MINUTES BEFORE MEALS     No current facility-administered medications for this encounter.    Physical Findings:  vitals were not taken for this visit.   /Unable to assess due to telephone follow up visit format.  Lab Findings: Lab Results  Component Value Date   WBC 4.4 03/22/2019   HGB 12.2 (L) 03/22/2019   HCT 36.7 (L) 03/22/2019   MCV 97.1 03/22/2019   PLT 96 (L) 03/22/2019     Radiographic Findings: CT Chest W Contrast  Result Date: 03/22/2019 CLINICAL DATA:  Stage IIIB right  lower lobe squamous cell lung cancer diagnosed October 2020 status post concurrent chemotherapy and radiation therapy. Radiation therapy completed 03/03/2019. Restaging. EXAM: CT CHEST WITH CONTRAST TECHNIQUE: Multidetector CT imaging of the chest was performed during intravenous contrast administration. CONTRAST:  17mL OMNIPAQUE IOHEXOL 300 MG/ML  SOLN COMPARISON:  11/21/2018 chest CT.  12/02/2018 PET-CT. FINDINGS: Cardiovascular: Normal heart size. Stable small pericardial effusion/thickening. Left anterior descending and left circumflex coronary atherosclerosis. Atherosclerotic nonaneurysmal thoracic aorta. Normal caliber pulmonary arteries. No central pulmonary emboli. Right internal jugular Port-A-Cath terminates at the cavoatrial junction. Mediastinum/Nodes: No discrete thyroid nodules. Unremarkable esophagus. No axillary adenopathy. Subcarinal short axis diameter 0.9 cm lymph node (series 2/image 77), decreased from 1.5 cm on 11/21/2018 chest CT using similar measurement technique. No pathologically enlarged mediastinal or hilar lymph nodes. Lungs/Pleura: No pneumothorax. Trace dependent right pleural effusion. No left pleural effusion. Mild centrilobular and paraseptal emphysema. Spiculated right lower lobe 4.6 x 4.2 cm lung mass (series 7/image 102), decreased from 5.8 x 5.8 cm using similar measurement technique. No new significant pulmonary nodules. Upper abdomen: Subcentimeter hypodense left liver dome lesion is too small to characterize and is unchanged, presumably benign. Contracted gallbladder, nonspecific. Musculoskeletal: No aggressive appearing focal osseous lesions. Moderate thoracic spondylosis. IMPRESSION: 1. Partial treatment response. Right lower lobe spiculated lung mass is decreased. Subcarinal lymphadenopathy is decreased. No new or progressive metastatic disease. 2. Stable small pericardial effusion. Trace dependent right pleural effusion. Aortic Atherosclerosis (ICD10-I70.0) and Emphysema  (ICD10-J43.9). Electronically Signed   By: Ilona Sorrel M.D.   On: 03/22/2019 18:12    Impression/Plan: 1. 79 y.o. gentleman with stage IIIb (T3, N2, M0) squamous cell carcinoma presented of the right lower lung. He appears to have recovered well from the effects of his recent chemoradiation and is currently without complaints.  He will begin consolidative immunotherapy with Imfinzi on 04/06/19, under the care and direction of Dr. Julien Nordmann.  We discussed that while we are happy to conitnue to participate in his care if clinically indicated, at this point, we will plan to see him back on an as needed basis only.  He will continue in routine follow up with Dr. Julien Nordmann for continued management of his systemic disease.  He knows to call at any time with any questions or concerns regarding his previous radiation treatments.     Nicholos Johns, PA-C

## 2019-04-06 ENCOUNTER — Inpatient Hospital Stay: Payer: Medicare Other

## 2019-04-06 ENCOUNTER — Other Ambulatory Visit: Payer: Self-pay | Admitting: Internal Medicine

## 2019-04-06 ENCOUNTER — Other Ambulatory Visit: Payer: Self-pay

## 2019-04-06 ENCOUNTER — Inpatient Hospital Stay: Payer: Medicare Other | Attending: Internal Medicine

## 2019-04-06 VITALS — BP 123/90 | HR 76 | Temp 97.6°F | Resp 16 | Wt 200.5 lb

## 2019-04-06 DIAGNOSIS — Z5112 Encounter for antineoplastic immunotherapy: Secondary | ICD-10-CM | POA: Insufficient documentation

## 2019-04-06 DIAGNOSIS — C3491 Malignant neoplasm of unspecified part of right bronchus or lung: Secondary | ICD-10-CM

## 2019-04-06 DIAGNOSIS — L03116 Cellulitis of left lower limb: Secondary | ICD-10-CM | POA: Insufficient documentation

## 2019-04-06 DIAGNOSIS — E785 Hyperlipidemia, unspecified: Secondary | ICD-10-CM | POA: Insufficient documentation

## 2019-04-06 DIAGNOSIS — I10 Essential (primary) hypertension: Secondary | ICD-10-CM | POA: Diagnosis not present

## 2019-04-06 DIAGNOSIS — Z87891 Personal history of nicotine dependence: Secondary | ICD-10-CM | POA: Insufficient documentation

## 2019-04-06 DIAGNOSIS — C3431 Malignant neoplasm of lower lobe, right bronchus or lung: Secondary | ICD-10-CM | POA: Insufficient documentation

## 2019-04-06 DIAGNOSIS — Z95828 Presence of other vascular implants and grafts: Secondary | ICD-10-CM

## 2019-04-06 DIAGNOSIS — Z79899 Other long term (current) drug therapy: Secondary | ICD-10-CM | POA: Insufficient documentation

## 2019-04-06 LAB — CBC WITH DIFFERENTIAL (CANCER CENTER ONLY)
Abs Immature Granulocytes: 0.02 10*3/uL (ref 0.00–0.07)
Basophils Absolute: 0 10*3/uL (ref 0.0–0.1)
Basophils Relative: 1 %
Eosinophils Absolute: 0.1 10*3/uL (ref 0.0–0.5)
Eosinophils Relative: 3 %
HCT: 38.2 % — ABNORMAL LOW (ref 39.0–52.0)
Hemoglobin: 12.5 g/dL — ABNORMAL LOW (ref 13.0–17.0)
Immature Granulocytes: 1 %
Lymphocytes Relative: 22 %
Lymphs Abs: 1 10*3/uL (ref 0.7–4.0)
MCH: 32.7 pg (ref 26.0–34.0)
MCHC: 32.7 g/dL (ref 30.0–36.0)
MCV: 100 fL (ref 80.0–100.0)
Monocytes Absolute: 0.7 10*3/uL (ref 0.1–1.0)
Monocytes Relative: 16 %
Neutro Abs: 2.6 10*3/uL (ref 1.7–7.7)
Neutrophils Relative %: 57 %
Platelet Count: 120 10*3/uL — ABNORMAL LOW (ref 150–400)
RBC: 3.82 MIL/uL — ABNORMAL LOW (ref 4.22–5.81)
RDW: 17.7 % — ABNORMAL HIGH (ref 11.5–15.5)
WBC Count: 4.4 10*3/uL (ref 4.0–10.5)
nRBC: 0 % (ref 0.0–0.2)

## 2019-04-06 LAB — CMP (CANCER CENTER ONLY)
ALT: 21 U/L (ref 0–44)
AST: 27 U/L (ref 15–41)
Albumin: 3.4 g/dL — ABNORMAL LOW (ref 3.5–5.0)
Alkaline Phosphatase: 87 U/L (ref 38–126)
Anion gap: 8 (ref 5–15)
BUN: 9 mg/dL (ref 8–23)
CO2: 25 mmol/L (ref 22–32)
Calcium: 8.4 mg/dL — ABNORMAL LOW (ref 8.9–10.3)
Chloride: 107 mmol/L (ref 98–111)
Creatinine: 0.82 mg/dL (ref 0.61–1.24)
GFR, Est AFR Am: >60 mL/min (ref >60)
GFR, Estimated: >60 mL/min (ref >60)
Glucose, Bld: 114 mg/dL — ABNORMAL HIGH (ref 70–99)
Potassium: 4.4 mmol/L (ref 3.5–5.1)
Sodium: 140 mmol/L (ref 135–145)
Total Bilirubin: 0.4 mg/dL (ref 0.3–1.2)
Total Protein: 6.5 g/dL (ref 6.5–8.1)

## 2019-04-06 LAB — TSH: TSH: 4.814 u[IU]/mL — ABNORMAL HIGH (ref 0.320–4.118)

## 2019-04-06 MED ORDER — SODIUM CHLORIDE 0.9 % IV SOLN
Freq: Once | INTRAVENOUS | Status: AC
  Filled 2019-04-06: qty 250

## 2019-04-06 MED ORDER — SODIUM CHLORIDE 0.9 % IV SOLN
1500.0000 mg | Freq: Once | INTRAVENOUS | Status: AC
  Administered 2019-04-06: 1500 mg via INTRAVENOUS
  Filled 2019-04-06: qty 30

## 2019-04-06 MED ORDER — HEPARIN SOD (PORK) LOCK FLUSH 100 UNIT/ML IV SOLN
500.0000 [IU] | Freq: Once | INTRAVENOUS | Status: AC | PRN
  Administered 2019-04-06: 12:00:00 500 [IU]
  Filled 2019-04-06: qty 5

## 2019-04-06 MED ORDER — SODIUM CHLORIDE 0.9% FLUSH
10.0000 mL | INTRAVENOUS | Status: DC | PRN
  Administered 2019-04-06: 10 mL via INTRAVENOUS
  Filled 2019-04-06: qty 10

## 2019-04-06 MED ORDER — SODIUM CHLORIDE 0.9% FLUSH
10.0000 mL | INTRAVENOUS | Status: DC | PRN
  Administered 2019-04-06: 12:00:00 10 mL
  Filled 2019-04-06: qty 10

## 2019-04-06 NOTE — Patient Instructions (Signed)

## 2019-04-06 NOTE — Patient Instructions (Signed)
[facility] Discharge Instructions for Patients Receiving Chemotherapy  Today you received the following chemotherapy agents: durvalumab (Imfinzi).  To help prevent nausea and vomiting after your treatment, we encourage you to take your nausea medication as directed.   If you develop nausea and vomiting that is not controlled by your nausea medication, call the clinic.   BELOW ARE SYMPTOMS THAT SHOULD BE REPORTED IMMEDIATELY:  *FEVER GREATER THAN 100.5 F  *CHILLS WITH OR WITHOUT FEVER  NAUSEA AND VOMITING THAT IS NOT CONTROLLED WITH YOUR NAUSEA MEDICATION  *UNUSUAL SHORTNESS OF BREATH  *UNUSUAL BRUISING OR BLEEDING  TENDERNESS IN MOUTH AND THROAT WITH OR WITHOUT PRESENCE OF ULCERS  *URINARY PROBLEMS  *BOWEL PROBLEMS  UNUSUAL RASH Items with * indicate a potential emergency and should be followed up as soon as possible.  Feel free to call the clinic should you have any questions or concerns. The clinic phone number is [phone].  Please show the [facility] at check-in to the Emergency Department and triage nurse.  Durvalumab injection What is this medicine? DURVALUMAB (dur VAL ue mab) is a monoclonal antibody. It is used to treat urothelial cancer and lung cancer. This medicine may be used for other purposes; ask your health care provider or pharmacist if you have questions. COMMON BRAND NAME(S): IMFINZI What should I tell my health care provider before I take this medicine? They need to know if you have any of these conditions:  diabetes  immune system problems  infection  inflammatory bowel disease  kidney disease  liver disease  lung or breathing disease  lupus  organ transplant  stomach or intestine problems  thyroid disease  an unusual or allergic reaction to durvalumab, other medicines, foods, dyes, or preservatives  pregnant or trying to get pregnant  breast-feeding How should I use this medicine? This medicine is  for infusion into a vein. It is given by a health care professional in a hospital or clinic setting. A special MedGuide will be given to you before each treatment. Be sure to read this information carefully each time. Talk to your pediatrician regarding the use of this medicine in children. Special care may be needed. Overdosage: If you think you have taken too much of this medicine contact a poison control center or emergency room at once. NOTE: This medicine is only for you. Do not share this medicine with others. What if I miss a dose? It is important not to miss your dose. Call your doctor or health care professional if you are unable to keep an appointment. What may interact with this medicine? Interactions have not been studied. This list may not describe all possible interactions. Give your health care provider a list of all the medicines, herbs, non-prescription drugs, or dietary supplements you use. Also tell them if you smoke, drink alcohol, or use illegal drugs. Some items may interact with your medicine. What should I watch for while using this medicine? This drug may make you feel generally unwell. Continue your course of treatment even though you feel ill unless your doctor tells you to stop. You may need blood work done while you are taking this medicine. Do not become pregnant while taking this medicine or for 3 months after stopping it. Women should inform their doctor if they wish to become pregnant or think they might be pregnant. There is a potential for serious side effects to an unborn child. Talk to your health care professional or pharmacist for more information. Do not  breast-feed an infant while taking this medicine or for 3 months after stopping it. What side effects may I notice from receiving this medicine? Side effects that you should report to your doctor or health care professional as soon as possible:  allergic reactions like skin rash, itching or hives, swelling of  the face, lips, or tongue  black, tarry stools  bloody or watery diarrhea  breathing problems  change in emotions or moods  change in sex drive  changes in vision  chest pain or chest tightness  chills  confusion  cough  facial flushing  fever  headache  signs and symptoms of high blood sugar such as dizziness; dry mouth; dry skin; fruity breath; nausea; stomach pain; increased hunger or thirst; increased urination  signs and symptoms of liver injury like dark yellow or brown urine; general ill feeling or flu-like symptoms; light-colored stools; loss of appetite; nausea; right upper belly pain; unusually weak or tired; yellowing of the eyes or skin  stomach pain  trouble passing urine or change in the amount of urine  weight gain or weight loss Side effects that usually do not require medical attention (report these to your doctor or health care professional if they continue or are bothersome):  bone pain  constipation  loss of appetite  muscle pain  nausea  swelling of the ankles, feet, hands  tiredness This list may not describe all possible side effects. Call your doctor for medical advice about side effects. You may report side effects to FDA at 1-800-FDA-1088. Where should I keep my medicine? This drug is given in a hospital or clinic and will not be stored at home. NOTE: This sheet is a summary. It may not cover all possible information. If you have questions about this medicine, talk to your doctor, pharmacist, or health care provider.  2020 Elsevier/Gold Standard (2016-05-26 19:25:04)

## 2019-04-07 ENCOUNTER — Telehealth: Payer: Self-pay | Admitting: *Deleted

## 2019-04-21 ENCOUNTER — Inpatient Hospital Stay: Payer: Medicare Other

## 2019-04-21 ENCOUNTER — Telehealth: Payer: Self-pay | Admitting: Medical Oncology

## 2019-04-21 ENCOUNTER — Inpatient Hospital Stay (HOSPITAL_BASED_OUTPATIENT_CLINIC_OR_DEPARTMENT_OTHER): Payer: Medicare Other | Admitting: Medical

## 2019-04-21 ENCOUNTER — Other Ambulatory Visit: Payer: Self-pay

## 2019-04-21 VITALS — BP 111/70 | Temp 98.0°F | Resp 20 | Ht 70.0 in | Wt 203.9 lb

## 2019-04-21 DIAGNOSIS — L0390 Cellulitis, unspecified: Secondary | ICD-10-CM

## 2019-04-21 DIAGNOSIS — E785 Hyperlipidemia, unspecified: Secondary | ICD-10-CM | POA: Diagnosis not present

## 2019-04-21 DIAGNOSIS — L03116 Cellulitis of left lower limb: Secondary | ICD-10-CM | POA: Diagnosis not present

## 2019-04-21 DIAGNOSIS — C3491 Malignant neoplasm of unspecified part of right bronchus or lung: Secondary | ICD-10-CM

## 2019-04-21 DIAGNOSIS — Z87891 Personal history of nicotine dependence: Secondary | ICD-10-CM | POA: Diagnosis not present

## 2019-04-21 DIAGNOSIS — Z79899 Other long term (current) drug therapy: Secondary | ICD-10-CM | POA: Diagnosis not present

## 2019-04-21 DIAGNOSIS — I10 Essential (primary) hypertension: Secondary | ICD-10-CM | POA: Diagnosis not present

## 2019-04-21 DIAGNOSIS — C3431 Malignant neoplasm of lower lobe, right bronchus or lung: Secondary | ICD-10-CM | POA: Diagnosis not present

## 2019-04-21 DIAGNOSIS — Z5112 Encounter for antineoplastic immunotherapy: Secondary | ICD-10-CM | POA: Diagnosis not present

## 2019-04-21 MED ORDER — SULFAMETHOXAZOLE-TRIMETHOPRIM 800-160 MG PO TABS: 1.0000 | ORAL_TABLET | Freq: Two times a day (BID) | ORAL | 0 refills | Status: DC

## 2019-04-21 MED ORDER — MUPIROCIN 2 % EX OINT: TOPICAL_OINTMENT | CUTANEOUS | 1 refills | Status: DC

## 2019-04-21 NOTE — Progress Notes (Signed)
Pt reports unknown bug bite 2 days ago on left lower inner calf.  Developed itching, redness, & tenderness w/weeping of yellowish/bloody pus.  Dressing removed, culture taken of site.  Pt redressed, provided with supplies for re-dressing self up to 4 times daily (to reapply abx cream to site as prescribed) until he can be seen on Monday of next week for f/u in [facility].  Pt verbalized understanding and teach back method used for pt to dress self at home, denies any questions or concerns at time of d/c and is aware to f/u before Monday as needed for any signs of worsening infection.  Ambulatory w/steady gait w/belongings and supplies to scheduling dept to schedule next [facility] visit.

## 2019-04-21 NOTE — Patient Instructions (Signed)

## 2019-04-21 NOTE — Telephone Encounter (Signed)
Pt notified of appt 

## 2019-04-21 NOTE — Progress Notes (Signed)
Symptoms Management Clinic Progress Note   Martin Bailey [id number] Aug 15, 1940 79 y.o.  Martin Bailey is managed by Dr. Fanny Bien. [name]  Actively treated with chemotherapy/immunotherapy/hormonal therapy: yes  Current therapy: Imfinzi   Last treated: 04/06/2019 (cycle 1, day 1)  Next scheduled appointment with provider: 05/04/2019  Assessment: Plan:    Wound cellulitis - Plan: sulfamethoxazole-trimethoprim (BACTRIM DS) 800-160 MG tablet, mupirocin ointment (BACTROBAN) 2 %, Culture, Aerobic  Stage III squamous cell carcinoma of right lung (HCC)   Wound of the left anterior distal lower extremity secondary to insect versus spider bite: Culture was collected.  Patient was given a prescription for Bactrim DS p.o. twice daily x7 days Bactroban 2% ointment to use 4 times daily.  He is scheduled to return on 04/24/2019 for follow-up.  The patient was seen with Dr. Narda Rutherford.  Stage III squamous cell carcinoma of the right lung: The patient continues to be managed by Dr. Julien Nordmann and was most recently treated with Imfinzi with cycle 1, day 1 dosed on 04/06/2019.  He is scheduled to return for consideration of his next treatment on 05/04/2019.  Please see After Visit Summary for patient specific instructions.  Future Appointments  Date Time Provider [place]  04/24/2019 10:00 AM [name] E., PA-C [facility] None  05/01/2019  3:15 PM [facility]  05/04/2019  8:15 AM [facility] LAB 4 [facility] None  05/04/2019  8:30 AM [facility] [place] FLUSH [facility] None  05/04/2019  9:00 AM Heilingoetter, Cassandra L, PA-C [facility] None  05/04/2019 10:15 AM [facility] INFUSION [facility] None  05/31/2019  8:00 AM [facility] LAB 6 [facility] None  05/31/2019  8:15 AM [facility] [place] FLUSH [facility] None  05/31/2019  8:45 AM Curt Bears, MD [facility] None  05/31/2019  9:30 AM [facility] INFUSION [facility] None  08/14/2019  8:20 AM Hunter, [name], MD [facility]    Orders Placed This Encounter  Procedures  . Culture, Aerobic       Subjective:   Patient ID:  Martin Bailey is a 79 y.o. (DOB 18-Dec-1940) male.  Chief Complaint:  Chief Complaint  Patient presents with  . Rash    HPI Martin Bailey Is a 79 y.o. male with a diagnosis of a stage III squamous cell carcinoma of the right lung.  The patient is managed by Dr. Julien Nordmann and is status post cycle 1, day 1 of Imfinzi which was dosed on 04/06/2019.  He presents to the clinic today with what he reports to be a "insect bite" about 2 days ago on his left anterior distal lower extremity.  Yesterday he noticed that the area was itching with development of a blisterlike lesion.  He noticed that his pants leg was wet.  He saw that the blister had ruptured and was draining "yellow pus with a little blood." He has not been under his house, and a garden area, or in his shop.  He does not recall being bitten or stung.  He denies fevers, chills, or sweats.  Medications: I have reviewed the patient's current medications.  Allergies: No Known Allergies  Past Medical History:  Diagnosis Date  . Cataract    left  . COLONIC POLYPS, HX OF 04/05/2007   ONE ADENOMATOUS POLYP AND ONE HYPERPLASTIC POLYP  . DEPRESSION 03/04/2007  . DERMATITIS, SEBORRHEIC 07/19/2007  . FIBRILLATION, ATRIAL 03/04/2007  . Hearing aid worn    B/L  . HYPERLIPIDEMIA 03/04/2007  . HYPERTENSION 03/04/2007  . Kidney stones   .  Lung cancer ([place])    non small cell lung ca  . Lung mass    mediastinal adenopathy    Past Surgical History:  Procedure Laterality Date  . CATARACT EXTRACTION W/ INTRAOCULAR LENS IMPLANT     right  . COLONOSCOPY    . IR IMAGING GUIDED PORT INSERTION  01/31/2019  . RADIOFREQUENCY ABLATION     x2 for fib, on 2nd attempt switched form a flutter ot a fib.   Marland Kitchen ROTATOR CUFF REPAIR Right   . TONSILLECTOMY    . VIDEO BRONCHOSCOPY WITH ENDOBRONCHIAL ULTRASOUND Left 12/30/2018    Procedure: VIDEO BRONCHOSCOPY WITH ENDOBRONCHIAL ULTRASOUND WITH FLUOROSCOPY;  Surgeon: Laurin Coder, MD;  Location: [facility];  Service: Thoracic;  Laterality: Left;  . WISDOM TOOTH EXTRACTION      Family History  Problem Relation Age of Onset  . Osteoporosis Mother   . Dementia Mother   . Lung cancer Father        former smoker  . Colon cancer Neg Hx   . Stomach cancer Neg Hx   . Esophageal cancer Neg Hx   . Rectal cancer Neg Hx     Social History   Socioeconomic History  . Marital status: Widowed    Spouse name: Not on file  . Number of children: 1  . Years of education: Not on file  . Highest education level: Not on file  Occupational History  . Occupation: retired  Tobacco Use  . Smoking status: Former Smoker    Packs/day: 1.50    Years: 10.00    Pack years: 15.00    Types: Cigarettes    Quit date: 07/08/1977    Years since quitting: 41.8  . Smokeless tobacco: Never Used  Substance and Sexual Activity  . Alcohol use: Yes    Alcohol/week: 42.0 standard drinks    Types: 42 Standard drinks or equivalent per week    Comment: 4 scotch's a day  . Drug use: No  . Sexual activity: Not on file  Other Topics Concern  . Not on file  Social History Narrative   Widower-wife died 53. 1 daughter. 2 grandkids. 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       Hobbies: golf, bridge, computer, sports, woodworking   Social Determinants of Health   Financial Resource Strain:   . Difficulty of Paying Living Expenses: Not on file  Food Insecurity:   . Worried About Charity fundraiser in the Last Year: Not on file  . Ran Out of Food in the Last Year: Not on file  Transportation Needs:   . Lack of Transportation (Medical): Not on file  . Lack of Transportation (Non-Medical): Not on file  Physical Activity:   . Days of Exercise per Week: Not on file  .  Minutes of Exercise per Session: Not on file  Stress:   . Feeling of Stress : Not on file  Social Connections:   . Frequency of Communication with Friends and Family: Not on file  . Frequency of Social Gatherings with Friends and Family: Not on file  . Attends Religious Services: Not on file  . Active Member of Clubs or Organizations: Not on file  . Attends Archivist Meetings: Not on file  . Marital Status: Not on file  Intimate Partner Violence:   . Fear of Current or Ex-Partner: Not on  file  . Emotionally Abused: Not on file  . Physically Abused: Not on file  . Sexually Abused: Not on file    Past Medical History, Surgical history, Social history, and Family history were reviewed and updated as appropriate.   Please see review of systems for further details on the patient's review from today.   Review of Systems:  Review of Systems  Constitutional: Negative for chills, diaphoresis and fever.  HENT: Negative for facial swelling and trouble swallowing.   Respiratory: Negative for cough, chest tightness and shortness of breath.   Cardiovascular: Negative for chest pain.  Skin: Positive for color change and wound. Negative for rash.    Objective:   Physical Exam:  BP 111/70 (BP Location: Right Arm, Patient Position: Sitting)   Temp 98 F (36.7 C) (Temporal)   Resp 20   Ht 5\' 10"  (1.778 m)   Wt 203 lb 14.4 oz (92.5 kg)   SpO2 99%   BMI 29.26 kg/m  ECOG: 0  Physical Exam Constitutional:      General: He is not in acute distress.    Appearance: He is not diaphoretic.  HENT:     Head: Normocephalic and atraumatic.  Eyes:     General: No scleral icterus.       Right eye: No discharge.        Left eye: No discharge.  Musculoskeletal:        General: No deformity.  Skin:    General: Skin is warm and dry.     Findings: Erythema and lesion present. No rash.  Neurological:     Mental Status: He is alert.     Coordination: Coordination normal.     Gait:  Gait normal.  Psychiatric:        Mood and Affect: Mood normal.        Behavior: Behavior normal.        Thought Content: Thought content normal.        Judgment: Judgment normal.      The above lesion measures 15 x 7 cm with the area of denuded flesh measuring 7 x 4 cm.  Lab Review:     Component Value Date/Time   NA 140 04/06/2019 0850   K 4.4 04/06/2019 0850   CL 107 04/06/2019 0850   CO2 25 04/06/2019 0850   GLUCOSE 114 (H) 04/06/2019 0850   GLUCOSE 108 (H) 03/25/2006 1043   BUN 9 04/06/2019 0850   CREATININE 0.82 04/06/2019 0850   CALCIUM 8.4 (L) 04/06/2019 0850   PROT 6.5 04/06/2019 0850   ALBUMIN 3.4 (L) 04/06/2019 0850   AST 27 04/06/2019 0850   ALT 21 04/06/2019 0850   ALKPHOS 87 04/06/2019 0850   BILITOT 0.4 04/06/2019 0850   GFRNONAA >60 04/06/2019 0850   GFRAA >60 04/06/2019 0850       Component Value Date/Time   WBC 4.4 04/06/2019 0850   WBC 8.4 01/31/2019 1311   RBC 3.82 (L) 04/06/2019 0850   HGB 12.5 (L) 04/06/2019 0850   HCT 38.2 (L) 04/06/2019 0850   PLT 120 (L) 04/06/2019 0850   MCV 100.0 04/06/2019 0850   MCH 32.7 04/06/2019 0850   MCHC 32.7 04/06/2019 0850   RDW 17.7 (H) 04/06/2019 0850   LYMPHSABS 1.0 04/06/2019 0850   MONOABS 0.7 04/06/2019 0850   EOSABS 0.1 04/06/2019 0850   BASOSABS 0.0 04/06/2019 0850   -------------------------------  Imaging from last 24 hours (if applicable):  Radiology interpretation: No results found.  This patient was seen with Dr. Lorenso Courier with my treatment plan reviewed with him. He expressed agreement with my medical management of this patient.

## 2019-04-21 NOTE — Telephone Encounter (Signed)
New L ankle skin changes- L ankle with redness , itching, weeping. Soaked his sock and slipper. Wrapped in guaze overnight and drainage looks a little bloody.  Last week had a small red area on hand with swelling,-resolved. imfinzi -first dose jan 7th Per [facility] today.

## 2019-04-22 ENCOUNTER — Encounter: Payer: Self-pay | Admitting: Medical

## 2019-04-22 NOTE — Progress Notes (Signed)
I have read Martin Bailey note and examined the patient personally. I agree with his assessment and plan.   In brief, [name] is a 79 year old Caucasian male with medical history significant for squamous cell cancer of the lung currently on maintenance durvalumab chemotherapy. He presented today with a blistering lesion of his left lower extremity. Upon removal of the bandages cover the lesion the blister burst. The lesion was quite large in size (approximately 6 inches in diameter at widest margin). There was a darkened small 1 cm in diameter lesion where the initial insult was believed to have occurred. The surrounding skin was nontender and erythematous.  Agree with plan to culture lesion and begin empiric PO antibiotics for presumed cellulitis. Additionally recommend antibacterial topical treatment to prevent any infection of the newly exposed skin under the blister. Gave [name] strong return precautions for systemic symptoms of fevers, chills, sweats or worsening erythema/pain/discharge from the lower extremity. [name] voiced his understanding. Plan for RTC with [name] on 04/24/2019 for repeat assessment.   Martin Peoples, MD Department of Hematology/Oncology [place] [facility] Phone: [phone] Pager: [phone] Email: [email or web address]

## 2019-04-24 ENCOUNTER — Telehealth: Payer: Self-pay | Admitting: Medical

## 2019-04-24 ENCOUNTER — Telehealth: Payer: Self-pay | Admitting: Emergency Medicine

## 2019-04-24 ENCOUNTER — Inpatient Hospital Stay (HOSPITAL_BASED_OUTPATIENT_CLINIC_OR_DEPARTMENT_OTHER): Payer: Medicare Other | Admitting: Medical

## 2019-04-24 ENCOUNTER — Encounter: Payer: Medicare Other | Admitting: Medical

## 2019-04-24 ENCOUNTER — Other Ambulatory Visit: Payer: Self-pay

## 2019-04-24 VITALS — BP 102/78 | HR 85 | Temp 98.6°F | Resp 18

## 2019-04-24 DIAGNOSIS — Z87891 Personal history of nicotine dependence: Secondary | ICD-10-CM | POA: Diagnosis not present

## 2019-04-24 DIAGNOSIS — Z79899 Other long term (current) drug therapy: Secondary | ICD-10-CM | POA: Diagnosis not present

## 2019-04-24 DIAGNOSIS — S81802D Unspecified open wound, left lower leg, subsequent encounter: Secondary | ICD-10-CM

## 2019-04-24 DIAGNOSIS — E785 Hyperlipidemia, unspecified: Secondary | ICD-10-CM | POA: Diagnosis not present

## 2019-04-24 DIAGNOSIS — Z5112 Encounter for antineoplastic immunotherapy: Secondary | ICD-10-CM | POA: Diagnosis not present

## 2019-04-24 DIAGNOSIS — C3491 Malignant neoplasm of unspecified part of right bronchus or lung: Secondary | ICD-10-CM | POA: Diagnosis not present

## 2019-04-24 DIAGNOSIS — L03116 Cellulitis of left lower limb: Secondary | ICD-10-CM | POA: Diagnosis not present

## 2019-04-24 DIAGNOSIS — C3431 Malignant neoplasm of lower lobe, right bronchus or lung: Secondary | ICD-10-CM | POA: Diagnosis not present

## 2019-04-24 DIAGNOSIS — L0390 Cellulitis, unspecified: Secondary | ICD-10-CM | POA: Diagnosis not present

## 2019-04-24 DIAGNOSIS — I10 Essential (primary) hypertension: Secondary | ICD-10-CM | POA: Diagnosis not present

## 2019-04-24 LAB — AEROBIC CULTURE W GRAM STAIN (SUPERFICIAL SPECIMEN)
Culture: NO GROWTH
Gram Stain: NONE SEEN

## 2019-04-24 MED ORDER — MUPIROCIN 2 % EX OINT: TOPICAL_OINTMENT | CUTANEOUS | 1 refills | Status: DC

## 2019-04-24 NOTE — Progress Notes (Signed)
Home health referral placed for management of pt's leg wound per PA Lucianne Lei.  Pt states he has enough supplies to last him until [facility] can visit for wound care.

## 2019-04-24 NOTE — Patient Instructions (Signed)

## 2019-04-24 NOTE — Telephone Encounter (Signed)
Rescheduled per 1/25 sch msg. Called and spoke with pt, confirmed 1/25 appt time change

## 2019-04-24 NOTE — Telephone Encounter (Signed)
Called pt regarding missed [facility] f/u appt this am, no answer.  LVM requesting pt call back to reschedule f/u appt for today or tomorrow at the latest for wound recheck.

## 2019-04-24 NOTE — Progress Notes (Signed)
Symptoms Management Clinic Progress Note   Martin Bailey [id number] 13-Jul-1940 79 y.o.  Martin Bailey is managed by Dr. Fanny Bailey. Martin Bailey  Actively treated with chemotherapy/immunotherapy/hormonal therapy: yes  Current therapy: Imfinzi   Last treated: 04/06/2019 (cycle 1, day 1)  Next scheduled appointment with provider: 05/04/2019  Assessment: Plan:    Stage III squamous cell carcinoma of right lung (Martin Bailey) - Plan: Ambulatory referral to [place] of left leg, subsequent encounter - Plan: Ambulatory referral to [place], mupirocin ointment (BACTROBAN) 2 %  Wound cellulitis - Plan: mupirocin ointment (BACTROBAN) 2 %   Wound of the left anterior distal lower extremity secondary to insect versus spider bite: Culture was collected. No growth at 2 days. Patient was given a prescription for Bactrim DS p.o. twice daily x7 days and Bactroban 2% ointment to use 4 times daily. He has had no issues with taking the Bactrim or Bactroban. Will continue taking Bactrim to complete antibiotic course. Recommended continued Bactroban or triple antibiotic ointment daily as well. Notes difficulty in dressing wound. Home health service ordered and referral sent.  He is scheduled to return on 04/24/2019 for follow-up.  The patient was seen with Dr. Narda Bailey.  Stage III squamous cell carcinoma of the right lung: The patient continues to be managed by Dr. Julien Bailey and was most recently treated with Imfinzi with cycle 1, day 1 dosed on 04/06/2019.  He is scheduled to return for consideration of his next treatment on 05/04/2019.  Please see After Visit Summary for patient specific instructions.  Future Appointments  Date Time Provider [place]  05/04/2019  8:15 AM [facility] LAB 4 [facility] None  05/04/2019  8:30 AM [facility] [place] FLUSH [facility] None  05/04/2019  9:00 AM Bailey, Martin L, PA-C [facility] None  05/04/2019 10:15 AM [facility] INFUSION [facility]  None  05/31/2019  8:00 AM [facility] LAB 6 [facility] None  05/31/2019  8:15 AM [facility] [place] FLUSH [facility] None  05/31/2019  8:45 AM Martin Bears, MD [facility] None  05/31/2019  9:30 AM [facility] INFUSION [facility] None  08/14/2019  8:20 AM Hunter, [name], MD [facility]    Orders Placed This Encounter  Procedures   Ambulatory referral to [place]       Subjective:   Patient ID:  Martin Bailey is a 79 y.o. (DOB 1940-07-10) male.  Chief Complaint:  Chief Complaint  Patient presents with   Follow-up    Wound Recheck    HPI Martin Bailey Is a 79 y.o. male with a diagnosis of a stage III squamous cell carcinoma of the right lung.  The patient is managed by Dr. Julien Bailey and is status post cycle 1, day 1 of Imfinzi which was dosed on 04/06/2019.  He presents to the clinic today for follow up on his "insect bite" on his left anterior distal lower extremity that was initially seen and treated here in the office on 04/21/2019. Over the weekend and today he was experiencing associated pain, worse when walking, improved with rest and Aleve. The pruritis has improved with Benedryl. Continues to have yellow/brown drainage from the wound with no blood or pus. Denies fevers, chills, worsening swelling or redness. Notes difficulty in changing his dressings. Currently lives alone.    Medications: I have reviewed the patient's current medications.  Allergies: No Known Allergies  Past Medical History:  Diagnosis Date   Cataract    left   COLONIC POLYPS, HX OF 04/05/2007   ONE ADENOMATOUS POLYP  AND ONE HYPERPLASTIC POLYP   DEPRESSION 03/04/2007   DERMATITIS, SEBORRHEIC 07/19/2007   FIBRILLATION, ATRIAL 03/04/2007   Hearing aid worn    B/Bailey   HYPERLIPIDEMIA 03/04/2007   HYPERTENSION 03/04/2007   Kidney stones    Lung cancer ([place])    non small cell lung ca   Lung mass    mediastinal adenopathy    Past Surgical History:  Procedure Laterality Date   CATARACT EXTRACTION  W/ INTRAOCULAR LENS IMPLANT     right   COLONOSCOPY     IR IMAGING GUIDED PORT INSERTION  01/31/2019   RADIOFREQUENCY ABLATION     x2 for fib, on 2nd attempt switched form a flutter ot a fib.    ROTATOR CUFF REPAIR Right    TONSILLECTOMY     VIDEO BRONCHOSCOPY WITH ENDOBRONCHIAL ULTRASOUND Left 12/30/2018   Procedure: VIDEO BRONCHOSCOPY WITH ENDOBRONCHIAL ULTRASOUND WITH FLUOROSCOPY;  Surgeon: Martin Coder, MD;  Location: [facility];  Service: Thoracic;  Laterality: Left;   WISDOM TOOTH EXTRACTION      Family History  Problem Relation Age of Onset   Osteoporosis Mother    Dementia Mother    Lung cancer Father        former smoker   Colon cancer Neg Hx    Stomach cancer Neg Hx    Esophageal cancer Neg Hx    Rectal cancer Neg Hx     Social History   Socioeconomic History   Marital status: Widowed    Spouse name: Not on file   Number of children: 1   Years of education: Not on file   Highest education level: Not on file  Occupational History   Occupation: retired  Tobacco Use   Smoking status: Former Smoker    Packs/day: 1.50    Years: 10.00    Pack years: 15.00    Types: Cigarettes    Quit date: 07/08/1977    Years since quitting: 41.8   Smokeless tobacco: Never Used  Substance and Sexual Activity   Alcohol use: Yes    Alcohol/week: 42.0 standard drinks    Types: 42 Standard drinks or equivalent per week    Comment: 4 scotch's a day   Drug use: No   Sexual activity: Not on file  Other Topics Concern   Not on file  Social History Narrative   Widower-wife died 74. 1 daughter. 2 grandkids. 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       Hobbies: golf, bridge, computer, sports, woodworking   Social Determinants of Health   Financial Resource Strain:    Difficulty of Paying Living Expenses: Not on file  Food Insecurity:    Worried About  Charity fundraiser in the Last Year: Not on file   [employer] of Food in the Last Year: Not on file  Transportation Needs:    Lack of Transportation (Medical): Not on file   Lack of Transportation (Non-Medical): Not on file  Physical Activity:    Days of Exercise per Week: Not on file   Minutes of Exercise per Session: Not on file  Stress:    Feeling of Stress : Not on file  Social Connections:    Frequency of Communication with Friends and Family: Not on file   Frequency of Social Gatherings with Friends and Family: Not on file   Attends Religious Services: Not on file  Active Member of Clubs or Organizations: Not on file   Attends Archivist Meetings: Not on file   Marital Status: Not on file  Intimate Partner Violence:    Fear of Current or Ex-Partner: Not on file   Emotionally Abused: Not on file   Physically Abused: Not on file   Sexually Abused: Not on file    Past Medical History, Surgical history, Social history, and Family history were reviewed and updated as appropriate.   Please see review of systems for further details on the patient's review from today.   Review of Systems:  Review of Systems  Constitutional: Negative for chills, diaphoresis and fever.  HENT: Negative for facial swelling and trouble swallowing.   Respiratory: Negative for cough, chest tightness and shortness of breath.   Cardiovascular: Negative for chest pain and leg swelling.       No intermittent claudication.  Gastrointestinal: Negative for abdominal pain, constipation and diarrhea.  Skin: Positive for color change and wound. Negative for rash.    Objective:   Physical Exam:  BP 102/78 (BP Location: Right Arm, Patient Position: Sitting)   Pulse 85 Comment: hx afib, irregular HR, well controlled per pt  Temp 98.6 F (37 C) (Oral)   Resp 18   SpO2 100%  ECOG: 0  Physical Exam Constitutional:      General: He is not in acute distress.    Appearance: He is not diaphoretic.    HENT:     Head: Normocephalic and atraumatic.  Eyes:     General: No scleral icterus.       Right eye: No discharge.        Left eye: No discharge.  Cardiovascular:     Rate and Rhythm: Normal rate. Rhythm irregular.     Pulses: Normal pulses.     Heart sounds: No murmur. No gallop.   Pulmonary:     Effort: No respiratory distress.     Breath sounds: Normal breath sounds. No wheezing, rhonchi or rales.  Abdominal:     General: Bowel sounds are normal. There is no distension.     Tenderness: There is no abdominal tenderness. There is no guarding.  Musculoskeletal:        General: No deformity.     Cervical back: No tenderness.  Lymphadenopathy:     Cervical: No cervical adenopathy.  Skin:    General: Skin is warm and dry.     Findings: Erythema, lesion and rash present. No abscess. Rash is crusting and vesicular.  Neurological:     Mental Status: He is alert.     Coordination: Coordination normal.     Gait: Gait normal.  Psychiatric:        Mood and Affect: Mood normal.        Behavior: Behavior normal.        Thought Content: Thought content normal.        Judgment: Judgment normal.          Lab Review:     Component Value Date/Time   NA 140 04/06/2019 0850   K 4.4 04/06/2019 0850   CL 107 04/06/2019 0850   CO2 25 04/06/2019 0850   GLUCOSE 114 (H) 04/06/2019 0850   GLUCOSE 108 (H) 03/25/2006 1043   BUN 9 04/06/2019 0850   CREATININE 0.82 04/06/2019 0850   CALCIUM 8.4 (Bailey) 04/06/2019 0850   PROT 6.5 04/06/2019 0850   ALBUMIN 3.4 (Bailey) 04/06/2019 0850   AST 27 04/06/2019 0850   ALT  21 04/06/2019 0850   ALKPHOS 87 04/06/2019 0850   BILITOT 0.4 04/06/2019 0850   GFRNONAA >60 04/06/2019 0850   GFRAA >60 04/06/2019 0850       Component Value Date/Time   WBC 4.4 04/06/2019 0850   WBC 8.4 01/31/2019 1311   RBC 3.82 (Bailey) 04/06/2019 0850   HGB 12.5 (Bailey) 04/06/2019 0850   HCT 38.2 (Bailey) 04/06/2019 0850   PLT 120 (Bailey) 04/06/2019 0850   MCV 100.0 04/06/2019 0850    MCH 32.7 04/06/2019 0850   MCHC 32.7 04/06/2019 0850   RDW 17.7 (H) 04/06/2019 0850   LYMPHSABS 1.0 04/06/2019 0850   MONOABS 0.7 04/06/2019 0850   EOSABS 0.1 04/06/2019 0850   BASOSABS 0.0 04/06/2019 0850   -------------------------------  Imaging from last 24 hours (if applicable):  Radiology interpretation: No results found.      This patient was seen and examined with Dareen Piano, PA-S.  His exam shows some improvement in his left lower extremity since last Friday.  As noted above, the patient has been referred for home health wound care.  He is having some difficulty in dressing his wound.  We discussed that he may leave his bandage off for periods during the day in hopes that this will allow for drying of the wound development of granulation tissue.  He expresses understanding and agreement with this plan.  [name], MHS, PA-C Physician Assistant

## 2019-05-01 ENCOUNTER — Ambulatory Visit: Payer: Self-pay

## 2019-05-03 NOTE — Progress Notes (Signed)
[name] OFFICE PROGRESS NOTE  [name], MD [place] [address]  DIAGNOSIS: Stage IIIb (T3, N2, M0) non-small cell lung cancer, squamous cell carcinoma presented with right lower lobe lung mass in addition to subcarinal lymphadenopathy diagnosed in October 2020.  PRIOR THERAPY: Weekly concurrent chemoradiation with Carboplatin for an AUC of 2 Paclitaxel 45 mg/m2. Last dose 02/27/2019 . Status post5cycles.  CURRENT THERAPY: Consolidation immunotherapy with Imfinzi 1,500 mg IV every 4 weeks. Status post 1 cycle.   INTERVAL HISTORY: Martin Bailey 79 y.o. male returns to the clinic for a follow up visit. The patient is feeling well today without any concerning complaints. During the interval since his last treatment, the patient was seen in the symptom management clinic for a left lower extremity leg wound/blister/bite. Cultures were negative for growth. He was treated with bactroban and bactrim. He has home health assistance for wound care. His wound is significantly better at this time. Denies fevers, erythema, drainage, fluctuance, or other wounds at this time.   Otherwise, the patient continues to tolerated his first treatment well without any adverse effects. Denies any fever, chills, night sweats, or weight loss. Denies any chest pain, shortness of breath, or hemoptysis. His cough is improved at this time which had been present prior to his lung cancer diagnosis. Denies any nausea, vomiting, diarrhea, or constipation. Denies any headache or visual changes. Denies any rashes or skin changes. The patient is here today for evaluation prior to starting cycle # 2   MEDICAL HISTORY: Past Medical History:  Diagnosis Date  . Cataract    left  . COLONIC POLYPS, HX OF 04/05/2007   ONE ADENOMATOUS POLYP AND ONE HYPERPLASTIC POLYP  . DEPRESSION 03/04/2007  . DERMATITIS, SEBORRHEIC 07/19/2007  . FIBRILLATION, ATRIAL 03/04/2007  . Hearing aid  worn    B/L  . HYPERLIPIDEMIA 03/04/2007  . HYPERTENSION 03/04/2007  . Kidney stones   . Lung cancer ([place])    non small cell lung ca  . Lung mass    mediastinal adenopathy    ALLERGIES:  has No Known Allergies.  MEDICATIONS:  Current Outpatient Medications  Medication Sig Dispense Refill  . amLODipine (NORVASC) 5 MG tablet TAKE 1 TABLET BY MOUTH  DAILY (Patient taking differently: Take 5 mg by mouth daily. ) 90 tablet 3  . atenolol (TENORMIN) 50 MG tablet Take 1 tablet (50 mg total) by mouth 2 (two) times daily. 180 tablet 3  . clobetasol (TEMOVATE) 0.05 % external solution Apply 1 application topically 2 (two) times daily. For seborrheic dermatitis- 7 days maximum 50 mL 0  . lidocaine-prilocaine (EMLA) cream Apply 1 application topically as needed. 30 g 0  . Multiple Vitamin (MULTIVITAMIN WITH MINERALS) TABS tablet Take 1 tablet by mouth daily.    Marland Kitchen triamcinolone cream (KENALOG) 0.5 % Apply topically 2 (two) times daily as needed. (Patient taking differently: Apply 1 application topically 2 (two) times daily as needed (skin irritation/rash). ) 30 g 1  . XARELTO 20 MG TABS tablet TAKE 1 TABLET BY MOUTH  DAILY WITH SUPPER (Patient taking differently: Take 20 mg by mouth daily. ) 90 tablet 3   No current facility-administered medications for this visit.    SURGICAL HISTORY:  Past Surgical History:  Procedure Laterality Date  . CATARACT EXTRACTION W/ INTRAOCULAR LENS IMPLANT     right  . COLONOSCOPY    . IR IMAGING GUIDED PORT INSERTION  01/31/2019  . RADIOFREQUENCY ABLATION     x2  for fib, on 2nd attempt switched form a flutter ot a fib.   Marland Kitchen ROTATOR CUFF REPAIR Right   . TONSILLECTOMY    . VIDEO BRONCHOSCOPY WITH ENDOBRONCHIAL ULTRASOUND Left 12/30/2018   Procedure: VIDEO BRONCHOSCOPY WITH ENDOBRONCHIAL ULTRASOUND WITH FLUOROSCOPY;  Surgeon: Laurin Coder, MD;  Location: [facility];  Service: Thoracic;  Laterality: Left;  . WISDOM TOOTH EXTRACTION      REVIEW OF SYSTEMS:    Review of Systems  Constitutional: Negative for appetite change, chills, fatigue, fever and unexpected weight change.  HENT:Negative for mouth sores, sore throat, nosebleed, and trouble swallowing.  Eyes: Negative for eye problems and icterus.  Respiratory:Negative for hemoptysis,cough, shortness of breath and wheezing.  Cardiovascular: Negative for chest pain and leg swelling.  Gastrointestinal:Negative for abdominal pain, diarrhea, constipation, nausea and vomiting.  Genitourinary: Negative for bladder incontinence, difficulty urinating, dysuria, frequency and hematuria.  Musculoskeletal: Negative for back pain, gait problem, neck pain and neck stiffness.  Skin: Negative for itching and rash.  Neurological: Negative for dizziness, extremity weakness, gait problem, headaches, light-headedness and seizures.  Hematological: Negative for adenopathy. Does not bruise/bleed easily.  Psychiatric/Behavioral: Negative for confusion, depression and sleep disturbance. The patient is not nervous/anxious.      PHYSICAL EXAMINATION:  Blood pressure 116/70, pulse 67, temperature 97.8 F (36.6 C), temperature source Temporal, resp. rate 17, height 5\' 10"  (1.778 m), weight 202 lb 8 oz (91.9 kg), SpO2 100 %.  ECOG PERFORMANCE STATUS: 1 - Symptomatic but completely ambulatory  Physical Exam  Constitutional: Oriented to person, place, and time and well-developed, well-nourished, and in no distress.  HENT:  Head: Normocephalic and atraumatic.  Mouth/Throat: Oropharynx is clear and moist. No oropharyngeal exudate.  Eyes: Conjunctivae are normal. Right eye exhibits no discharge. Left eye exhibits no discharge. No scleral icterus.  Neck: Normal range of motion. Neck supple.  Cardiovascular: Normal rate, irregular rhythm, normal heart sounds and intact distal pulses.   Pulmonary/Chest: Effort normal and breath sounds normal. No respiratory distress. No wheezes. No rales.  Abdominal: Soft. Bowel sounds  are normal. Exhibits no distension and no mass. There is no tenderness.  Musculoskeletal: Normal range of motion. Exhibits no edema.  Lymphadenopathy:    No cervical adenopathy.  Neurological: Alert and oriented to person, place, and time. Exhibits normal muscle tone. Gait normal. Coordination normal.  Skin: Skin is warm and dry. No rash noted. Not diaphoretic. No erythema. No pallor.  Psychiatric: Mood, memory and judgment normal.  Vitals reviewed.  LABORATORY DATA: Lab Results  Component Value Date   WBC 6.4 05/04/2019   HGB 12.7 (L) 05/04/2019   HCT 37.8 (L) 05/04/2019   MCV 99.2 05/04/2019   PLT 135 (L) 05/04/2019      Chemistry      Component Value Date/Time   NA 136 05/04/2019 0814   K 4.2 05/04/2019 0814   CL 104 05/04/2019 0814   CO2 24 05/04/2019 0814   BUN 15 05/04/2019 0814   CREATININE 0.97 05/04/2019 0814      Component Value Date/Time   CALCIUM 8.8 (L) 05/04/2019 0814   ALKPHOS 80 05/04/2019 0814   AST 32 05/04/2019 0814   ALT 23 05/04/2019 0814   BILITOT 0.5 05/04/2019 0814       RADIOGRAPHIC STUDIES:  No results found.   ASSESSMENT/PLAN:  This is a very pleasant 79 year old Caucasian male diagnosed with stage IIIb (T3, N2, M0) non-small cell lung cancer, squamous cell carcinoma. He presented with a right lower lobe lung mass in addition  to subcarinal lymphadenopathy. He was diagnosed in October 2020. He is negative for any actionable mutations.  He underwent concurrent chemoradiation with carboplatin for an AUC of 2 and paclitaxel 45 mg/m. He is status post5cycles. Chemotherapy was held last week secondary to thrombocytopenia.   He is currently undergoing consolidation immunotherapy with Imfinzi 1500 mg IV every 4 weeks. He is status post 1 cycle.   Labs were reviewed. Recommend that he proceed with cycle #2 today as scheduled.   We will see him back for a follow up visit in 4 weeks for evaluation before starting cycle 3.   The patient's  wound is significantly improved at this time. Instructed the patient to call immediately if he develops and skin blisters/wounds in the interval.   The patient was advised to call immediately if he has any concerning symptoms in the interval. The patient voices understanding of current disease status and treatment options and is in agreement with the current care plan. All questions were answered. The patient knows to call the clinic with any problems, questions or concerns. We can certainly see the patient much sooner if necessary    No orders of the defined types were placed in this encounter.    Trixie Maclaren L Braelynne Garinger, PA-C 05/04/19

## 2019-05-04 ENCOUNTER — Other Ambulatory Visit: Payer: Self-pay

## 2019-05-04 ENCOUNTER — Encounter: Payer: Self-pay | Admitting: Physician Assistant

## 2019-05-04 ENCOUNTER — Inpatient Hospital Stay: Payer: Medicare Other

## 2019-05-04 ENCOUNTER — Inpatient Hospital Stay: Payer: Medicare Other | Attending: Internal Medicine | Admitting: Physician Assistant

## 2019-05-04 VITALS — BP 116/70 | HR 67 | Temp 97.8°F | Resp 17 | Ht 70.0 in | Wt 202.5 lb

## 2019-05-04 DIAGNOSIS — T451X5A Adverse effect of antineoplastic and immunosuppressive drugs, initial encounter: Secondary | ICD-10-CM | POA: Insufficient documentation

## 2019-05-04 DIAGNOSIS — E785 Hyperlipidemia, unspecified: Secondary | ICD-10-CM | POA: Insufficient documentation

## 2019-05-04 DIAGNOSIS — D6959 Other secondary thrombocytopenia: Secondary | ICD-10-CM | POA: Diagnosis not present

## 2019-05-04 DIAGNOSIS — I10 Essential (primary) hypertension: Secondary | ICD-10-CM | POA: Diagnosis not present

## 2019-05-04 DIAGNOSIS — Z923 Personal history of irradiation: Secondary | ICD-10-CM | POA: Diagnosis not present

## 2019-05-04 DIAGNOSIS — Z9221 Personal history of antineoplastic chemotherapy: Secondary | ICD-10-CM | POA: Diagnosis not present

## 2019-05-04 DIAGNOSIS — Z79899 Other long term (current) drug therapy: Secondary | ICD-10-CM | POA: Insufficient documentation

## 2019-05-04 DIAGNOSIS — C3431 Malignant neoplasm of lower lobe, right bronchus or lung: Secondary | ICD-10-CM | POA: Diagnosis not present

## 2019-05-04 DIAGNOSIS — C3491 Malignant neoplasm of unspecified part of right bronchus or lung: Secondary | ICD-10-CM

## 2019-05-04 DIAGNOSIS — Z7901 Long term (current) use of anticoagulants: Secondary | ICD-10-CM | POA: Insufficient documentation

## 2019-05-04 DIAGNOSIS — Z5112 Encounter for antineoplastic immunotherapy: Secondary | ICD-10-CM | POA: Insufficient documentation

## 2019-05-04 LAB — CMP (CANCER CENTER ONLY)
ALT: 23 U/L (ref 0–44)
AST: 32 U/L (ref 15–41)
Albumin: 3.5 g/dL (ref 3.5–5.0)
Alkaline Phosphatase: 80 U/L (ref 38–126)
Anion gap: 8 (ref 5–15)
BUN: 15 mg/dL (ref 8–23)
CO2: 24 mmol/L (ref 22–32)
Calcium: 8.8 mg/dL — ABNORMAL LOW (ref 8.9–10.3)
Chloride: 104 mmol/L (ref 98–111)
Creatinine: 0.97 mg/dL (ref 0.61–1.24)
GFR, Est AFR Am: >60 mL/min (ref >60)
GFR, Estimated: >60 mL/min (ref >60)
Glucose, Bld: 131 mg/dL — ABNORMAL HIGH (ref 70–99)
Potassium: 4.2 mmol/L (ref 3.5–5.1)
Sodium: 136 mmol/L (ref 135–145)
Total Bilirubin: 0.5 mg/dL (ref 0.3–1.2)
Total Protein: 6.9 g/dL (ref 6.5–8.1)

## 2019-05-04 LAB — CBC WITH DIFFERENTIAL (CANCER CENTER ONLY)
Abs Immature Granulocytes: 0.01 10*3/uL (ref 0.00–0.07)
Basophils Absolute: 0 10*3/uL (ref 0.0–0.1)
Basophils Relative: 1 %
Eosinophils Absolute: 0.4 10*3/uL (ref 0.0–0.5)
Eosinophils Relative: 6 %
HCT: 37.8 % — ABNORMAL LOW (ref 39.0–52.0)
Hemoglobin: 12.7 g/dL — ABNORMAL LOW (ref 13.0–17.0)
Immature Granulocytes: 0 %
Lymphocytes Relative: 16 %
Lymphs Abs: 1 10*3/uL (ref 0.7–4.0)
MCH: 33.3 pg (ref 26.0–34.0)
MCHC: 33.6 g/dL (ref 30.0–36.0)
MCV: 99.2 fL (ref 80.0–100.0)
Monocytes Absolute: 0.8 10*3/uL (ref 0.1–1.0)
Monocytes Relative: 13 %
Neutro Abs: 4.2 10*3/uL (ref 1.7–7.7)
Neutrophils Relative %: 64 %
Platelet Count: 135 10*3/uL — ABNORMAL LOW (ref 150–400)
RBC: 3.81 MIL/uL — ABNORMAL LOW (ref 4.22–5.81)
RDW: 13.2 % (ref 11.5–15.5)
WBC Count: 6.4 10*3/uL (ref 4.0–10.5)
nRBC: 0 % (ref 0.0–0.2)

## 2019-05-04 LAB — TSH: TSH: 5.161 u[IU]/mL — ABNORMAL HIGH (ref 0.320–4.118)

## 2019-05-04 MED ORDER — SODIUM CHLORIDE 0.9% FLUSH
10.0000 mL | INTRAVENOUS | Status: DC | PRN
  Administered 2019-05-04: 10 mL
  Filled 2019-05-04: qty 10

## 2019-05-04 MED ORDER — HEPARIN SOD (PORK) LOCK FLUSH 100 UNIT/ML IV SOLN
500.0000 [IU] | Freq: Once | INTRAVENOUS | Status: AC | PRN
  Administered 2019-05-04: 12:00:00 500 [IU]
  Filled 2019-05-04: qty 5

## 2019-05-04 MED ORDER — SODIUM CHLORIDE 0.9 % IV SOLN
1500.0000 mg | Freq: Once | INTRAVENOUS | Status: AC
  Administered 2019-05-04: 1500 mg via INTRAVENOUS
  Filled 2019-05-04: qty 30

## 2019-05-04 MED ORDER — SODIUM CHLORIDE 0.9 % IV SOLN
Freq: Once | INTRAVENOUS | Status: AC
  Filled 2019-05-04: qty 250

## 2019-05-04 NOTE — Patient Instructions (Signed)
[facility] Discharge Instructions for Patients Receiving Chemotherapy  Today you received the following chemotherapy agents/immunotherapy :  Durvalumab.  To help prevent nausea and vomiting after your treatment, we encourage you to take your nausea medication as prescribed.   If you develop nausea and vomiting that is not controlled by your nausea medication, call the clinic.   BELOW ARE SYMPTOMS THAT SHOULD BE REPORTED IMMEDIATELY:  *FEVER GREATER THAN 100.5 F  *CHILLS WITH OR WITHOUT FEVER  NAUSEA AND VOMITING THAT IS NOT CONTROLLED WITH YOUR NAUSEA MEDICATION  *UNUSUAL SHORTNESS OF BREATH  *UNUSUAL BRUISING OR BLEEDING  TENDERNESS IN MOUTH AND THROAT WITH OR WITHOUT PRESENCE OF ULCERS  *URINARY PROBLEMS  *BOWEL PROBLEMS  UNUSUAL RASH Items with * indicate a potential emergency and should be followed up as soon as possible.  Feel free to call the clinic should you have any questions or concerns. The clinic phone number is [phone].  Please show the [facility] at check-in to the Emergency Department and triage nurse.

## 2019-05-05 ENCOUNTER — Telehealth: Payer: Self-pay | Admitting: Internal Medicine

## 2019-05-05 NOTE — Telephone Encounter (Signed)
Scheduled per los. Called and left msg about added appt. Mailed printout

## 2019-05-07 ENCOUNTER — Ambulatory Visit: Payer: Self-pay

## 2019-05-16 NOTE — Telephone Encounter (Signed)
Dr Loletha Carrow- Please see notes below. It appears that patient is now actively undergoing treatment for stage III lung cancer. Would you like me to again change recall colonoscopy, cancel colonoscopy recall or have him come to office to discuss?

## 2019-05-17 ENCOUNTER — Encounter: Payer: Self-pay | Admitting: Gastroenterology

## 2019-05-17 NOTE — Telephone Encounter (Signed)
Recall has been changed to 10/2019 at which time we can reassess his health status. Letter mailed to patient's home address.

## 2019-05-17 NOTE — Telephone Encounter (Signed)
Move his recall back 6 months.  I will compose a letter.  Please mail it to him.

## 2019-05-31 ENCOUNTER — Inpatient Hospital Stay: Payer: Medicare Other

## 2019-05-31 ENCOUNTER — Encounter: Payer: Self-pay | Admitting: Internal Medicine

## 2019-05-31 ENCOUNTER — Inpatient Hospital Stay: Payer: Medicare Other | Attending: Internal Medicine | Admitting: Internal Medicine

## 2019-05-31 ENCOUNTER — Other Ambulatory Visit: Payer: Self-pay

## 2019-05-31 VITALS — BP 108/68 | HR 94 | Temp 97.7°F | Resp 19 | Ht 70.0 in | Wt 199.3 lb

## 2019-05-31 DIAGNOSIS — E785 Hyperlipidemia, unspecified: Secondary | ICD-10-CM | POA: Insufficient documentation

## 2019-05-31 DIAGNOSIS — Z79899 Other long term (current) drug therapy: Secondary | ICD-10-CM | POA: Insufficient documentation

## 2019-05-31 DIAGNOSIS — D61818 Other pancytopenia: Secondary | ICD-10-CM | POA: Insufficient documentation

## 2019-05-31 DIAGNOSIS — Z7901 Long term (current) use of anticoagulants: Secondary | ICD-10-CM | POA: Insufficient documentation

## 2019-05-31 DIAGNOSIS — Z5112 Encounter for antineoplastic immunotherapy: Secondary | ICD-10-CM | POA: Insufficient documentation

## 2019-05-31 DIAGNOSIS — I10 Essential (primary) hypertension: Secondary | ICD-10-CM | POA: Diagnosis not present

## 2019-05-31 DIAGNOSIS — C3490 Malignant neoplasm of unspecified part of unspecified bronchus or lung: Secondary | ICD-10-CM | POA: Diagnosis not present

## 2019-05-31 DIAGNOSIS — C3491 Malignant neoplasm of unspecified part of right bronchus or lung: Secondary | ICD-10-CM

## 2019-05-31 DIAGNOSIS — C3431 Malignant neoplasm of lower lobe, right bronchus or lung: Secondary | ICD-10-CM | POA: Diagnosis not present

## 2019-05-31 DIAGNOSIS — R05 Cough: Secondary | ICD-10-CM | POA: Diagnosis not present

## 2019-05-31 LAB — CBC WITH DIFFERENTIAL (CANCER CENTER ONLY)
Abs Immature Granulocytes: 0.01 10*3/uL (ref 0.00–0.07)
Basophils Absolute: 0 10*3/uL (ref 0.0–0.1)
Basophils Relative: 1 %
Eosinophils Absolute: 0.3 10*3/uL (ref 0.0–0.5)
Eosinophils Relative: 5 %
HCT: 38.4 % — ABNORMAL LOW (ref 39.0–52.0)
Hemoglobin: 12.9 g/dL — ABNORMAL LOW (ref 13.0–17.0)
Immature Granulocytes: 0 %
Lymphocytes Relative: 16 %
Lymphs Abs: 1 10*3/uL (ref 0.7–4.0)
MCH: 31.9 pg (ref 26.0–34.0)
MCHC: 33.6 g/dL (ref 30.0–36.0)
MCV: 95 fL (ref 80.0–100.0)
Monocytes Absolute: 0.9 10*3/uL (ref 0.1–1.0)
Monocytes Relative: 15 %
Neutro Abs: 3.9 10*3/uL (ref 1.7–7.7)
Neutrophils Relative %: 63 %
Platelet Count: 136 10*3/uL — ABNORMAL LOW (ref 150–400)
RBC: 4.04 MIL/uL — ABNORMAL LOW (ref 4.22–5.81)
RDW: 12.2 % (ref 11.5–15.5)
WBC Count: 6.1 10*3/uL (ref 4.0–10.5)
nRBC: 0 % (ref 0.0–0.2)

## 2019-05-31 LAB — CMP (CANCER CENTER ONLY)
ALT: 21 U/L (ref 0–44)
AST: 27 U/L (ref 15–41)
Albumin: 3.3 g/dL — ABNORMAL LOW (ref 3.5–5.0)
Alkaline Phosphatase: 91 U/L (ref 38–126)
Anion gap: 8 (ref 5–15)
BUN: 12 mg/dL (ref 8–23)
CO2: 25 mmol/L (ref 22–32)
Calcium: 8.7 mg/dL — ABNORMAL LOW (ref 8.9–10.3)
Chloride: 106 mmol/L (ref 98–111)
Creatinine: 0.95 mg/dL (ref 0.61–1.24)
GFR, Est AFR Am: >60 mL/min (ref >60)
GFR, Estimated: >60 mL/min (ref >60)
Glucose, Bld: 120 mg/dL — ABNORMAL HIGH (ref 70–99)
Potassium: 4.2 mmol/L (ref 3.5–5.1)
Sodium: 139 mmol/L (ref 135–145)
Total Bilirubin: 0.3 mg/dL (ref 0.3–1.2)
Total Protein: 6.9 g/dL (ref 6.5–8.1)

## 2019-05-31 LAB — TSH: TSH: 3.932 u[IU]/mL (ref 0.320–4.118)

## 2019-05-31 MED ORDER — SODIUM CHLORIDE 0.9% FLUSH
10.0000 mL | INTRAVENOUS | Status: DC | PRN
  Administered 2019-05-31: 10 mL
  Filled 2019-05-31: qty 10

## 2019-05-31 MED ORDER — SODIUM CHLORIDE 0.9 % IV SOLN
Freq: Once | INTRAVENOUS | Status: AC
  Filled 2019-05-31: qty 250

## 2019-05-31 MED ORDER — SODIUM CHLORIDE 0.9 % IV SOLN
1500.0000 mg | Freq: Once | INTRAVENOUS | Status: AC
  Administered 2019-05-31: 1500 mg via INTRAVENOUS
  Filled 2019-05-31: qty 30

## 2019-05-31 MED ORDER — HEPARIN SOD (PORK) LOCK FLUSH 100 UNIT/ML IV SOLN
500.0000 [IU] | Freq: Once | INTRAVENOUS | Status: AC | PRN
  Administered 2019-05-31: 500 [IU]
  Filled 2019-05-31: qty 5

## 2019-05-31 NOTE — Progress Notes (Signed)
[facility] [phone]   [phone]  OFFICE PROGRESS NOTE  [name], MD [place] [address]  DIAGNOSIS: Stage IIIb (T3, N2, M0) non-small cell lung cancer, squamous cell carcinoma presented with right lower lobe lung mass in addition to subcarinal lymphadenopathy diagnosed in October 2020.  PRIOR THERAPY: Weekly concurrent chemoradiation with Carboplatin for an AUC of 2 Paclitaxel 45 mg/m2. First dose 01/16/2019. Status post5cycles.  Last dose was given February 13, 2019.  CURRENT THERAPY:  Consolidation treatment with immunotherapy with Imfinzi 1500 mg IV every 4 weeks.  First dose April 05, 2019.  Status post 2 cycles.  INTERVAL HISTORY: Martin Bailey 79 y.o. male returns to the clinic today for follow-up visit.  The patient is feeling fine today with no concerning complaints except for mild cough.  He denied having any current chest pain, shortness of breath or hemoptysis.  He denied having any recent weight loss or night sweats.  He has no nausea, vomiting, diarrhea or constipation.  He denied having any headache or visual changes.  Is here today for evaluation before starting cycle #3 of his treatment with Imfinzi.  MEDICAL HISTORY: Past Medical History:  Diagnosis Date  . Cataract    left  . COLONIC POLYPS, HX OF 04/05/2007   ONE ADENOMATOUS POLYP AND ONE HYPERPLASTIC POLYP  . DEPRESSION 03/04/2007  . DERMATITIS, SEBORRHEIC 07/19/2007  . FIBRILLATION, ATRIAL 03/04/2007  . Hearing aid worn    B/L  . HYPERLIPIDEMIA 03/04/2007  . HYPERTENSION 03/04/2007  . Kidney stones   . Lung cancer ([facility])    non small cell lung ca  . Lung mass    mediastinal adenopathy    ALLERGIES:  has No Known Allergies.  MEDICATIONS:  Current Outpatient Medications  Medication Sig Dispense Refill  . amLODipine (NORVASC) 5 MG tablet TAKE 1 TABLET BY MOUTH  DAILY (Patient taking differently: Take 5 mg by mouth daily. )  90 tablet 3  . atenolol (TENORMIN) 50 MG tablet Take 1 tablet (50 mg total) by mouth 2 (two) times daily. 180 tablet 3  . clobetasol (TEMOVATE) 0.05 % external solution Apply 1 application topically 2 (two) times daily. For seborrheic dermatitis- 7 days maximum 50 mL 0  . lidocaine-prilocaine (EMLA) cream Apply 1 application topically as needed. 30 g 0  . Multiple Vitamin (MULTIVITAMIN WITH MINERALS) TABS tablet Take 1 tablet by mouth daily.    Marland Kitchen triamcinolone cream (KENALOG) 0.5 % Apply topically 2 (two) times daily as needed. 30 g 1  . XARELTO 20 MG TABS tablet TAKE 1 TABLET BY MOUTH  DAILY WITH SUPPER (Patient taking differently: Take 20 mg by mouth daily. ) 90 tablet 3   No current facility-administered medications for this visit.    SURGICAL HISTORY:  Past Surgical History:  Procedure Laterality Date  . CATARACT EXTRACTION W/ INTRAOCULAR LENS IMPLANT     right  . COLONOSCOPY    . IR IMAGING GUIDED PORT INSERTION  01/31/2019  . RADIOFREQUENCY ABLATION     x2 for fib, on 2nd attempt switched form a flutter ot a fib.   Marland Kitchen ROTATOR CUFF REPAIR Right   . TONSILLECTOMY    . VIDEO BRONCHOSCOPY WITH ENDOBRONCHIAL ULTRASOUND Left 12/30/2018   Procedure: VIDEO BRONCHOSCOPY WITH ENDOBRONCHIAL ULTRASOUND WITH FLUOROSCOPY;  Surgeon: Laurin Coder, MD;  Location: [facility];  Service: Thoracic;  Laterality: Left;  . WISDOM TOOTH EXTRACTION      REVIEW OF SYSTEMS:  A comprehensive review of systems was negative except for: Respiratory: positive for cough   PHYSICAL EXAMINATION: General appearance: alert, cooperative and no distress Head: Normocephalic, without obvious abnormality, atraumatic Neck: no adenopathy, no JVD, supple, symmetrical, trachea midline and thyroid not enlarged, symmetric, no tenderness/mass/nodules Lymph nodes: Cervical, supraclavicular, and axillary nodes normal. Resp: clear to auscultation bilaterally Back: symmetric, no curvature. ROM normal. No CVA tenderness. Cardio:  regular rate and rhythm, S1, S2 normal, no murmur, click, rub or gallop GI: soft, non-tender; bowel sounds normal; no masses,  no organomegaly Extremities: extremities normal, atraumatic, no cyanosis or edema  ECOG PERFORMANCE STATUS: 1 - Symptomatic but completely ambulatory  Blood pressure 108/68, pulse 94, temperature 97.7 F (36.5 C), temperature source Oral, resp. rate 19, height 5\' 10"  (1.778 m), weight 199 lb 4.8 oz (90.4 kg), SpO2 97 %.  LABORATORY DATA: Lab Results  Component Value Date   WBC 6.4 05/04/2019   HGB 12.7 (L) 05/04/2019   HCT 37.8 (L) 05/04/2019   MCV 99.2 05/04/2019   PLT 135 (L) 05/04/2019      Chemistry      Component Value Date/Time   NA 136 05/04/2019 0814   K 4.2 05/04/2019 0814   CL 104 05/04/2019 0814   CO2 24 05/04/2019 0814   BUN 15 05/04/2019 0814   CREATININE 0.97 05/04/2019 0814      Component Value Date/Time   CALCIUM 8.8 (L) 05/04/2019 0814   ALKPHOS 80 05/04/2019 0814   AST 32 05/04/2019 0814   ALT 23 05/04/2019 0814   BILITOT 0.5 05/04/2019 0814       RADIOGRAPHIC STUDIES: No results found.  ASSESSMENT AND PLAN: This is a very pleasant 79 years old white male with a stage IIIb non-small cell lung cancer, squamous cell carcinoma diagnosed in October 2020 status post course of concurrent chemoradiation with 5 cycles of chemotherapy with carboplatin and paclitaxel. The patient tolerated his treatment well except for pancytopenia. The patient had partial response after his treatment. He is currently undergoing consolidation treatment with immunotherapy with Imfinzi 1500 mg IV every 4 weeks status post 2 cycles.  He is tolerating this treatment well with no concerning adverse effect except for mild cough. I recommended for the patient to proceed with cycle #3 today as planned. I will see him back for follow-up visit in 4 weeks for evaluation after repeating CT scan of the chest for restaging of his disease. He was advised to call  immediately if he has any concerning symptoms in the interval. The patient voices understanding of current disease status and treatment options and is in agreement with the current care plan.  All questions were answered. The patient knows to call the clinic with any problems, questions or concerns. We can certainly see the patient much sooner if necessary.  Disclaimer: This note was dictated with voice recognition software. Similar sounding words can inadvertently be transcribed and may not be corrected upon review.

## 2019-05-31 NOTE — Patient Instructions (Signed)
[facility] Discharge Instructions for Patients Receiving Chemotherapy  Today you received the following chemotherapy agents/immunotherapy :  Durvalumab.  To help prevent nausea and vomiting after your treatment, we encourage you to take your nausea medication as prescribed.   If you develop nausea and vomiting that is not controlled by your nausea medication, call the clinic.   BELOW ARE SYMPTOMS THAT SHOULD BE REPORTED IMMEDIATELY:  *FEVER GREATER THAN 100.5 F  *CHILLS WITH OR WITHOUT FEVER  NAUSEA AND VOMITING THAT IS NOT CONTROLLED WITH YOUR NAUSEA MEDICATION  *UNUSUAL SHORTNESS OF BREATH  *UNUSUAL BRUISING OR BLEEDING  TENDERNESS IN MOUTH AND THROAT WITH OR WITHOUT PRESENCE OF ULCERS  *URINARY PROBLEMS  *BOWEL PROBLEMS  UNUSUAL RASH Items with * indicate a potential emergency and should be followed up as soon as possible.  Feel free to call the clinic should you have any questions or concerns. The clinic phone number is [phone].  Please show the [facility] at check-in to the Emergency Department and triage nurse.

## 2019-06-01 ENCOUNTER — Telehealth: Payer: Self-pay | Admitting: Internal Medicine

## 2019-06-01 NOTE — Telephone Encounter (Signed)
Scheduled per los. Called and left msg about added appts. Mailed printout

## 2019-06-23 NOTE — Progress Notes (Signed)
Pharmacist Chemotherapy Monitoring - Follow Up Assessment    I verify that I have reviewed each item in the below checklist:  . Regimen for the patient is scheduled for the appropriate day and plan matches scheduled date. Marland Kitchen Appropriate non-routine labs are ordered dependent on drug ordered. . If applicable, additional medications reviewed and ordered per protocol based on lifetime cumulative doses and/or treatment regimen.   Plan for follow-up and/or issues identified: No . I-vent associated with next due treatment: No    Kennith Center, Pharm.D., CPP 06/23/2019@4 :17 PM

## 2019-06-28 ENCOUNTER — Ambulatory Visit (HOSPITAL_COMMUNITY)
Admission: RE | Admit: 2019-06-28 | Discharge: 2019-06-28 | Disposition: A | Discharge status: Home / Self Care | Payer: Medicare Other | Source: Ambulatory Visit | Attending: Internal Medicine | Admitting: Internal Medicine

## 2019-06-28 ENCOUNTER — Other Ambulatory Visit: Payer: Self-pay

## 2019-06-28 ENCOUNTER — Encounter (HOSPITAL_COMMUNITY): Payer: Self-pay

## 2019-06-28 DIAGNOSIS — C3490 Malignant neoplasm of unspecified part of unspecified bronchus or lung: Secondary | ICD-10-CM | POA: Insufficient documentation

## 2019-06-28 MED ORDER — SODIUM CHLORIDE (PF) 0.9 % IJ SOLN
INTRAMUSCULAR | Status: AC
  Filled 2019-06-28: qty 50

## 2019-06-28 MED ORDER — IOHEXOL 300 MG/ML  SOLN
75.0000 mL | Freq: Once | INTRAMUSCULAR | Status: AC | PRN
  Administered 2019-06-28: 75 mL via INTRAVENOUS

## 2019-06-28 MED ORDER — HEPARIN SOD (PORK) LOCK FLUSH 100 UNIT/ML IV SOLN
500.0000 [IU] | Freq: Once | INTRAVENOUS | Status: AC
  Administered 2019-06-28: 500 [IU] via INTRAVENOUS

## 2019-06-28 MED ORDER — HEPARIN SOD (PORK) LOCK FLUSH 100 UNIT/ML IV SOLN
INTRAVENOUS | Status: AC
  Filled 2019-06-28: qty 5

## 2019-06-29 ENCOUNTER — Other Ambulatory Visit: Payer: Self-pay

## 2019-06-29 ENCOUNTER — Inpatient Hospital Stay: Payer: Medicare Other

## 2019-06-29 ENCOUNTER — Encounter: Payer: Self-pay | Admitting: Internal Medicine

## 2019-06-29 ENCOUNTER — Inpatient Hospital Stay: Payer: Medicare Other | Attending: Internal Medicine | Admitting: Internal Medicine

## 2019-06-29 VITALS — BP 115/74 | HR 83 | Temp 98.0°F | Resp 18 | Ht 70.0 in | Wt 201.0 lb

## 2019-06-29 DIAGNOSIS — Z5112 Encounter for antineoplastic immunotherapy: Secondary | ICD-10-CM

## 2019-06-29 DIAGNOSIS — C3490 Malignant neoplasm of unspecified part of unspecified bronchus or lung: Secondary | ICD-10-CM

## 2019-06-29 DIAGNOSIS — Z79899 Other long term (current) drug therapy: Secondary | ICD-10-CM | POA: Insufficient documentation

## 2019-06-29 DIAGNOSIS — C3431 Malignant neoplasm of lower lobe, right bronchus or lung: Secondary | ICD-10-CM | POA: Diagnosis not present

## 2019-06-29 DIAGNOSIS — Z7901 Long term (current) use of anticoagulants: Secondary | ICD-10-CM | POA: Insufficient documentation

## 2019-06-29 DIAGNOSIS — C3491 Malignant neoplasm of unspecified part of right bronchus or lung: Secondary | ICD-10-CM

## 2019-06-29 DIAGNOSIS — I119 Hypertensive heart disease without heart failure: Secondary | ICD-10-CM | POA: Diagnosis not present

## 2019-06-29 DIAGNOSIS — E785 Hyperlipidemia, unspecified: Secondary | ICD-10-CM | POA: Diagnosis not present

## 2019-06-29 DIAGNOSIS — J8489 Other specified interstitial pulmonary diseases: Secondary | ICD-10-CM | POA: Insufficient documentation

## 2019-06-29 DIAGNOSIS — I10 Essential (primary) hypertension: Secondary | ICD-10-CM | POA: Diagnosis not present

## 2019-06-29 DIAGNOSIS — D61818 Other pancytopenia: Secondary | ICD-10-CM | POA: Insufficient documentation

## 2019-06-29 DIAGNOSIS — Z95828 Presence of other vascular implants and grafts: Secondary | ICD-10-CM

## 2019-06-29 LAB — CBC WITH DIFFERENTIAL (CANCER CENTER ONLY)
Abs Immature Granulocytes: 0.03 10*3/uL (ref 0.00–0.07)
Basophils Absolute: 0.1 10*3/uL (ref 0.0–0.1)
Basophils Relative: 1 %
Eosinophils Absolute: 0.2 10*3/uL (ref 0.0–0.5)
Eosinophils Relative: 3 %
HCT: 39.8 % (ref 39.0–52.0)
Hemoglobin: 13.2 g/dL (ref 13.0–17.0)
Immature Granulocytes: 0 %
Lymphocytes Relative: 14 %
Lymphs Abs: 1.1 10*3/uL (ref 0.7–4.0)
MCH: 31.1 pg (ref 26.0–34.0)
MCHC: 33.2 g/dL (ref 30.0–36.0)
MCV: 93.6 fL (ref 80.0–100.0)
Monocytes Absolute: 1 10*3/uL (ref 0.1–1.0)
Monocytes Relative: 12 %
Neutro Abs: 5.9 10*3/uL (ref 1.7–7.7)
Neutrophils Relative %: 70 %
Platelet Count: 154 10*3/uL (ref 150–400)
RBC: 4.25 MIL/uL (ref 4.22–5.81)
RDW: 12.7 % (ref 11.5–15.5)
WBC Count: 8.4 10*3/uL (ref 4.0–10.5)
nRBC: 0 % (ref 0.0–0.2)

## 2019-06-29 LAB — CMP (CANCER CENTER ONLY)
ALT: 17 U/L (ref 0–44)
AST: 22 U/L (ref 15–41)
Albumin: 3.4 g/dL — ABNORMAL LOW (ref 3.5–5.0)
Alkaline Phosphatase: 97 U/L (ref 38–126)
Anion gap: 11 (ref 5–15)
BUN: 16 mg/dL (ref 8–23)
CO2: 22 mmol/L (ref 22–32)
Calcium: 8.9 mg/dL (ref 8.9–10.3)
Chloride: 105 mmol/L (ref 98–111)
Creatinine: 1.11 mg/dL (ref 0.61–1.24)
GFR, Est AFR Am: >60 mL/min (ref >60)
GFR, Estimated: >60 mL/min (ref >60)
Glucose, Bld: 117 mg/dL — ABNORMAL HIGH (ref 70–99)
Potassium: 4.4 mmol/L (ref 3.5–5.1)
Sodium: 138 mmol/L (ref 135–145)
Total Bilirubin: 0.5 mg/dL (ref 0.3–1.2)
Total Protein: 7 g/dL (ref 6.5–8.1)

## 2019-06-29 LAB — TSH: TSH: 4.402 u[IU]/mL — ABNORMAL HIGH (ref 0.320–4.118)

## 2019-06-29 MED ORDER — PREDNISONE 20 MG PO TABS: ORAL_TABLET | ORAL | 0 refills | Status: DC

## 2019-06-29 MED ORDER — HEPARIN SOD (PORK) LOCK FLUSH 100 UNIT/ML IV SOLN
500.0000 [IU] | Freq: Once | INTRAVENOUS | Status: AC
  Administered 2019-06-29: 500 [IU] via INTRAVENOUS
  Filled 2019-06-29: qty 5

## 2019-06-29 NOTE — Addendum Note (Signed)
Addended by: Ardeen Garland on: 06/29/2019 09:51 AM   Modules accepted: Orders

## 2019-06-29 NOTE — Progress Notes (Signed)
[facility] [phone]   [phone]  OFFICE PROGRESS NOTE  [name], MD [place] [address]  DIAGNOSIS: Stage IIIb (T3, N2, M0) non-small cell lung cancer, squamous cell carcinoma presented with right lower lobe lung mass in addition to subcarinal lymphadenopathy diagnosed in October 2020.  PRIOR THERAPY: Weekly concurrent chemoradiation with Carboplatin for an AUC of 2 Paclitaxel 45 mg/m2. First dose 01/16/2019. Status post5cycles.  Last dose was given February 13, 2019.  CURRENT THERAPY:  Consolidation treatment with immunotherapy with Imfinzi 1500 mg IV every 4 weeks.  First dose April 05, 2019.  Status post 3 cycles.  Treatment is currently on hold secondary to immunotherapy mediated pneumonitis.  INTERVAL HISTORY: Martin Bailey 79 y.o. male returns to the clinic today for follow-up visit.  The patient is feeling fine today with no concerning complaints except for persistent itching.  He has been using Benadryl with no improvement.  The patient also complains of increasing cough with mild shortness of breath.  He denied having any chest pain or hemoptysis.  He denied having any fever or chills.  He has no nausea, vomiting, diarrhea or constipation.  He denied having any headache or visual changes.  He had repeat CT scan of the chest performed recently and he is here for evaluation and discussion of his discuss results.  MEDICAL HISTORY: Past Medical History:  Diagnosis Date  . Cataract    left  . COLONIC POLYPS, HX OF 04/05/2007   ONE ADENOMATOUS POLYP AND ONE HYPERPLASTIC POLYP  . DEPRESSION 03/04/2007  . DERMATITIS, SEBORRHEIC 07/19/2007  . FIBRILLATION, ATRIAL 03/04/2007  . Hearing aid worn    B/L  . HYPERLIPIDEMIA 03/04/2007  . HYPERTENSION 03/04/2007  . Kidney stones   . Lung cancer ([place])    non small cell lung ca  . Lung mass    mediastinal adenopathy    ALLERGIES:  has No Known  Allergies.  MEDICATIONS:  Current Outpatient Medications  Medication Sig Dispense Refill  . diphenhydrAMINE (BENADRYL) 25 MG tablet Take 25 mg by mouth every 6 (six) hours as needed.    Marland Kitchen amLODipine (NORVASC) 5 MG tablet TAKE 1 TABLET BY MOUTH  DAILY (Patient taking differently: Take 5 mg by mouth daily. ) 90 tablet 3  . atenolol (TENORMIN) 50 MG tablet Take 1 tablet (50 mg total) by mouth 2 (two) times daily. 180 tablet 3  . clobetasol (TEMOVATE) 0.05 % external solution Apply 1 application topically 2 (two) times daily. For seborrheic dermatitis- 7 days maximum 50 mL 0  . lidocaine-prilocaine (EMLA) cream Apply 1 application topically as needed. 30 g 0  . Multiple Vitamin (MULTIVITAMIN WITH MINERALS) TABS tablet Take 1 tablet by mouth daily.    Marland Kitchen triamcinolone cream (KENALOG) 0.5 % Apply topically 2 (two) times daily as needed. 30 g 1  . XARELTO 20 MG TABS tablet TAKE 1 TABLET BY MOUTH  DAILY WITH SUPPER (Patient taking differently: Take 20 mg by mouth daily. ) 90 tablet 3   No current facility-administered medications for this visit.    SURGICAL HISTORY:  Past Surgical History:  Procedure Laterality Date  . CATARACT EXTRACTION W/ INTRAOCULAR LENS IMPLANT     right  . COLONOSCOPY    . IR IMAGING GUIDED PORT INSERTION  01/31/2019  . RADIOFREQUENCY ABLATION     x2 for fib, on 2nd attempt switched form a flutter ot a fib.   Marland Kitchen ROTATOR CUFF REPAIR Right   .  TONSILLECTOMY    . VIDEO BRONCHOSCOPY WITH ENDOBRONCHIAL ULTRASOUND Left 12/30/2018   Procedure: VIDEO BRONCHOSCOPY WITH ENDOBRONCHIAL ULTRASOUND WITH FLUOROSCOPY;  Surgeon: Laurin Coder, MD;  Location: [facility];  Service: Thoracic;  Laterality: Left;  . WISDOM TOOTH EXTRACTION      REVIEW OF SYSTEMS:  Constitutional: negative Eyes: negative Ears, nose, mouth, throat, and face: negative Respiratory: positive for cough and dyspnea on exertion Cardiovascular: negative Gastrointestinal:  negative Genitourinary:negative Integument/breast: positive for pruritus Hematologic/lymphatic: negative Musculoskeletal:negative Neurological: negative Behavioral/Psych: negative Endocrine: negative Allergic/Immunologic: negative   PHYSICAL EXAMINATION: General appearance: alert, cooperative and no distress Head: Normocephalic, without obvious abnormality, atraumatic Neck: no adenopathy, no JVD, supple, symmetrical, trachea midline and thyroid not enlarged, symmetric, no tenderness/mass/nodules Lymph nodes: Cervical, supraclavicular, and axillary nodes normal. Resp: clear to auscultation bilaterally Back: symmetric, no curvature. ROM normal. No CVA tenderness. Cardio: regular rate and rhythm, S1, S2 normal, no murmur, click, rub or gallop GI: soft, non-tender; bowel sounds normal; no masses,  no organomegaly Extremities: extremities normal, atraumatic, no cyanosis or edema Neurologic: Alert and oriented X 3, normal strength and tone. Normal symmetric reflexes. Normal coordination and gait  ECOG PERFORMANCE STATUS: 1 - Symptomatic but completely ambulatory  Blood pressure 115/74, pulse 83, temperature 98 F (36.7 C), temperature source Oral, resp. rate 18, height 5\' 10"  (1.778 m), weight 201 lb (91.2 kg), SpO2 98 %.  LABORATORY DATA: Lab Results  Component Value Date   WBC 8.4 06/29/2019   HGB 13.2 06/29/2019   HCT 39.8 06/29/2019   MCV 93.6 06/29/2019   PLT 154 06/29/2019      Chemistry      Component Value Date/Time   NA 139 05/31/2019 0825   K 4.2 05/31/2019 0825   CL 106 05/31/2019 0825   CO2 25 05/31/2019 0825   BUN 12 05/31/2019 0825   CREATININE 0.95 05/31/2019 0825      Component Value Date/Time   CALCIUM 8.7 (L) 05/31/2019 0825   ALKPHOS 91 05/31/2019 0825   AST 27 05/31/2019 0825   ALT 21 05/31/2019 0825   BILITOT 0.3 05/31/2019 0825       RADIOGRAPHIC STUDIES: CT Chest W Contrast  Result Date: 06/28/2019 CLINICAL DATA:  Non-small-cell lung cancer.   Restaging. EXAM: CT CHEST WITH CONTRAST TECHNIQUE: Multidetector CT imaging of the chest was performed during intravenous contrast administration. CONTRAST:  67mL OMNIPAQUE IOHEXOL 300 MG/ML  SOLN COMPARISON:  03/22/2019 FINDINGS: Cardiovascular: Heart size upper normal with left atrial enlargement. Tiny pericardial effusion noted. Atherosclerotic calcification is noted in the wall of the thoracic aorta. Coronary artery calcification is evident. Mediastinum/Nodes: 8 mm short axis high paraesophageal node is stable. 9 mm short axis subcarinal lymph node measured previously has increased in the interval now measuring 13 mm short axis. No left hilar lymphadenopathy. Similar appearance of the amorphous soft tissue in the right hilar region. The esophagus has normal imaging features. There is no axillary lymphadenopathy. Lungs/Pleura: Interval progression of consolidative opacity in the parahilar right lung involving middle and lower lobes. Fairly marked increase in interstitial and airspace consolidative opacity in the right lower lobe surrounding the known right lower lobe pulmonary lesion. Small right pleural effusion is progressive in the interval Tiny pulmonary nodule medial left upper lobe on 18/5 is unchanged. No new suspicious nodule or mass in the left lung. No left effusion. Upper Abdomen: Unremarkable Musculoskeletal: No worrisome lytic or sclerotic osseous abnormality. IMPRESSION: 1. Interval progression of interstitial/interlobular opacity and patchy/consolidative airspace disease in the right lower lobe.  This may reflect continued evolution of post radiation reaction, but is fairly substantially progressed and infectious/inflammatory process or even recurrent tumor is not completely excluded. Small but progressive right pleural effusion associated. 2. Interval enlargement of a subcarinal lymph node, now mildly enlarged at 13 mm short axis compared to 9 mm short axis previously. Continued close attention  recommended as metastatic disease a concern. 3.  Aortic Atherosclerois (ICD10-170.0) Electronically Signed   By: Misty Stanley M.D.   On: 06/28/2019 10:43    ASSESSMENT AND PLAN: This is a very pleasant 79 years old white male with a stage IIIb non-small cell lung cancer, squamous cell carcinoma diagnosed in October 2020 status post course of concurrent chemoradiation with 5 cycles of chemotherapy with carboplatin and paclitaxel. The patient tolerated his treatment well except for pancytopenia. The patient had partial response after his treatment. He is currently undergoing consolidation treatment with immunotherapy with Imfinzi 1500 mg IV every 4 weeks status post 3 cycles.   The patient has been tolerating this treatment well except for itching and worsening cough recently. He had repeat CT scan of the chest performed recently.  I personally and independently reviewed the scan images and discussed the result and showed the images to the patient today. His scan showed no concerning findings for disease progression except for mild increase of subcarinal lymph node but the patient has significant airspace disease involving the right lung likely secondary to immunotherapy mediated pneumonitis. I recommended for the patient to hold his current treatment with immunotherapy for now. I started the patient on a tapered dose of prednisone with 80 mg p.o. daily for 1 week followed by 60 mg p.o. daily for 1 week followed by 40 mg p.o. daily for 1 week followed by 20 mg p.o. daily for 1 week followed by 10 mg p.o. daily for 1 week.  I will see him back for follow-up visit in around 5 weeks with repeat CT scan of the chest for reevaluation of the immunotherapy mediated pneumonitis. The patient was advised to call immediately if he has any other concerning symptoms in the interval. The patient voices understanding of current disease status and treatment options and is in agreement with the current care plan.  All  questions were answered. The patient knows to call the clinic with any problems, questions or concerns. We can certainly see the patient much sooner if necessary.  Disclaimer: This note was dictated with voice recognition software. Similar sounding words can inadvertently be transcribed and may not be corrected upon review.

## 2019-06-30 ENCOUNTER — Telehealth: Payer: Self-pay | Admitting: Internal Medicine

## 2019-06-30 NOTE — Telephone Encounter (Signed)
Scheduled per los. Called and left msg. Mailed printout  °

## 2019-07-12 ENCOUNTER — Other Ambulatory Visit: Payer: Self-pay | Admitting: Family Medicine

## 2019-07-23 ENCOUNTER — Other Ambulatory Visit: Payer: Self-pay | Admitting: Family Medicine

## 2019-07-27 ENCOUNTER — Ambulatory Visit: Payer: Medicare Other

## 2019-07-27 ENCOUNTER — Ambulatory Visit: Payer: Medicare Other | Admitting: Internal Medicine

## 2019-07-27 ENCOUNTER — Other Ambulatory Visit: Payer: Medicare Other

## 2019-08-01 ENCOUNTER — Inpatient Hospital Stay: Payer: Medicare Other | Attending: Internal Medicine

## 2019-08-01 ENCOUNTER — Other Ambulatory Visit: Payer: Self-pay

## 2019-08-01 DIAGNOSIS — C3491 Malignant neoplasm of unspecified part of right bronchus or lung: Secondary | ICD-10-CM

## 2019-08-01 DIAGNOSIS — J704 Drug-induced interstitial lung disorders, unspecified: Secondary | ICD-10-CM | POA: Diagnosis not present

## 2019-08-01 DIAGNOSIS — C3431 Malignant neoplasm of lower lobe, right bronchus or lung: Secondary | ICD-10-CM | POA: Insufficient documentation

## 2019-08-01 DIAGNOSIS — Z923 Personal history of irradiation: Secondary | ICD-10-CM | POA: Insufficient documentation

## 2019-08-01 DIAGNOSIS — D61818 Other pancytopenia: Secondary | ICD-10-CM | POA: Insufficient documentation

## 2019-08-01 DIAGNOSIS — C3490 Malignant neoplasm of unspecified part of unspecified bronchus or lung: Secondary | ICD-10-CM

## 2019-08-01 DIAGNOSIS — Z9221 Personal history of antineoplastic chemotherapy: Secondary | ICD-10-CM | POA: Insufficient documentation

## 2019-08-01 DIAGNOSIS — Z5112 Encounter for antineoplastic immunotherapy: Secondary | ICD-10-CM | POA: Diagnosis not present

## 2019-08-01 DIAGNOSIS — D61811 Other drug-induced pancytopenia: Secondary | ICD-10-CM | POA: Diagnosis not present

## 2019-08-01 DIAGNOSIS — Z79899 Other long term (current) drug therapy: Secondary | ICD-10-CM | POA: Insufficient documentation

## 2019-08-01 DIAGNOSIS — T451X5A Adverse effect of antineoplastic and immunosuppressive drugs, initial encounter: Secondary | ICD-10-CM | POA: Diagnosis not present

## 2019-08-01 LAB — CBC WITH DIFFERENTIAL (CANCER CENTER ONLY)
Abs Immature Granulocytes: 0.1 10*3/uL — ABNORMAL HIGH (ref 0.00–0.07)
Basophils Absolute: 0.1 10*3/uL (ref 0.0–0.1)
Basophils Relative: 1 %
Eosinophils Absolute: 0.1 10*3/uL (ref 0.0–0.5)
Eosinophils Relative: 1 %
HCT: 42.4 % (ref 39.0–52.0)
Hemoglobin: 13.9 g/dL (ref 13.0–17.0)
Immature Granulocytes: 1 %
Lymphocytes Relative: 9 %
Lymphs Abs: 0.8 10*3/uL (ref 0.7–4.0)
MCH: 31.5 pg (ref 26.0–34.0)
MCHC: 32.8 g/dL (ref 30.0–36.0)
MCV: 96.1 fL (ref 80.0–100.0)
Monocytes Absolute: 0.6 10*3/uL (ref 0.1–1.0)
Monocytes Relative: 6 %
Neutro Abs: 8 10*3/uL — ABNORMAL HIGH (ref 1.7–7.7)
Neutrophils Relative %: 82 %
Platelet Count: 134 10*3/uL — ABNORMAL LOW (ref 150–400)
RBC: 4.41 MIL/uL (ref 4.22–5.81)
RDW: 14.3 % (ref 11.5–15.5)
WBC Count: 9.6 10*3/uL (ref 4.0–10.5)
nRBC: 0 % (ref 0.0–0.2)

## 2019-08-01 LAB — CMP (CANCER CENTER ONLY)
ALT: 27 U/L (ref 0–44)
AST: 25 U/L (ref 15–41)
Albumin: 3.5 g/dL (ref 3.5–5.0)
Alkaline Phosphatase: 76 U/L (ref 38–126)
Anion gap: 10 (ref 5–15)
BUN: 11 mg/dL (ref 8–23)
CO2: 25 mmol/L (ref 22–32)
Calcium: 8.8 mg/dL — ABNORMAL LOW (ref 8.9–10.3)
Chloride: 104 mmol/L (ref 98–111)
Creatinine: 1.04 mg/dL (ref 0.61–1.24)
GFR, Est AFR Am: >60 mL/min (ref >60)
GFR, Estimated: >60 mL/min (ref >60)
Glucose, Bld: 100 mg/dL — ABNORMAL HIGH (ref 70–99)
Potassium: 4.6 mmol/L (ref 3.5–5.1)
Sodium: 139 mmol/L (ref 135–145)
Total Bilirubin: 0.6 mg/dL (ref 0.3–1.2)
Total Protein: 6.7 g/dL (ref 6.5–8.1)

## 2019-08-01 LAB — TSH: TSH: 2.642 u[IU]/mL (ref 0.320–4.118)

## 2019-08-02 ENCOUNTER — Ambulatory Visit (HOSPITAL_COMMUNITY)
Admission: RE | Admit: 2019-08-02 | Discharge: 2019-08-02 | Disposition: A | Discharge status: Home / Self Care | Payer: Medicare Other | Source: Ambulatory Visit | Attending: Internal Medicine | Admitting: Internal Medicine

## 2019-08-02 ENCOUNTER — Encounter (HOSPITAL_COMMUNITY): Payer: Self-pay

## 2019-08-02 DIAGNOSIS — C3490 Malignant neoplasm of unspecified part of unspecified bronchus or lung: Secondary | ICD-10-CM | POA: Insufficient documentation

## 2019-08-02 DIAGNOSIS — Z5111 Encounter for antineoplastic chemotherapy: Secondary | ICD-10-CM | POA: Diagnosis not present

## 2019-08-02 MED ORDER — HEPARIN SOD (PORK) LOCK FLUSH 100 UNIT/ML IV SOLN
500.0000 [IU] | Freq: Once | INTRAVENOUS | Status: AC
  Administered 2019-08-02: 500 [IU] via INTRAVENOUS

## 2019-08-02 MED ORDER — SODIUM CHLORIDE (PF) 0.9 % IJ SOLN
INTRAMUSCULAR | Status: AC
  Filled 2019-08-02: qty 50

## 2019-08-02 MED ORDER — IOHEXOL 300 MG/ML  SOLN
75.0000 mL | Freq: Once | INTRAMUSCULAR | Status: AC | PRN
  Administered 2019-08-02: 75 mL via INTRAVENOUS

## 2019-08-02 MED ORDER — HEPARIN SOD (PORK) LOCK FLUSH 100 UNIT/ML IV SOLN
INTRAVENOUS | Status: AC
  Filled 2019-08-02: qty 5

## 2019-08-03 ENCOUNTER — Encounter: Payer: Self-pay | Admitting: Internal Medicine

## 2019-08-03 ENCOUNTER — Other Ambulatory Visit: Payer: Self-pay

## 2019-08-03 ENCOUNTER — Inpatient Hospital Stay: Payer: Medicare Other | Admitting: Internal Medicine

## 2019-08-03 ENCOUNTER — Telehealth: Payer: Self-pay | Admitting: Internal Medicine

## 2019-08-03 VITALS — BP 117/73 | HR 81 | Temp 98.2°F | Resp 18 | Ht 70.0 in | Wt 198.2 lb

## 2019-08-03 DIAGNOSIS — Z9221 Personal history of antineoplastic chemotherapy: Secondary | ICD-10-CM | POA: Diagnosis not present

## 2019-08-03 DIAGNOSIS — C3491 Malignant neoplasm of unspecified part of right bronchus or lung: Secondary | ICD-10-CM

## 2019-08-03 DIAGNOSIS — J704 Drug-induced interstitial lung disorders, unspecified: Secondary | ICD-10-CM | POA: Diagnosis not present

## 2019-08-03 DIAGNOSIS — Z5112 Encounter for antineoplastic immunotherapy: Secondary | ICD-10-CM | POA: Diagnosis not present

## 2019-08-03 DIAGNOSIS — I10 Essential (primary) hypertension: Secondary | ICD-10-CM

## 2019-08-03 DIAGNOSIS — D61811 Other drug-induced pancytopenia: Secondary | ICD-10-CM | POA: Diagnosis not present

## 2019-08-03 DIAGNOSIS — D61818 Other pancytopenia: Secondary | ICD-10-CM | POA: Diagnosis not present

## 2019-08-03 DIAGNOSIS — Z923 Personal history of irradiation: Secondary | ICD-10-CM | POA: Diagnosis not present

## 2019-08-03 DIAGNOSIS — Z79899 Other long term (current) drug therapy: Secondary | ICD-10-CM | POA: Diagnosis not present

## 2019-08-03 DIAGNOSIS — C3431 Malignant neoplasm of lower lobe, right bronchus or lung: Secondary | ICD-10-CM | POA: Diagnosis not present

## 2019-08-03 DIAGNOSIS — T451X5A Adverse effect of antineoplastic and immunosuppressive drugs, initial encounter: Secondary | ICD-10-CM | POA: Diagnosis not present

## 2019-08-03 NOTE — Progress Notes (Signed)
[facility] [phone]   [phone]  OFFICE PROGRESS NOTE  [name], MD [place] [address]  DIAGNOSIS: Stage IIIb (T3, N2, M0) non-small cell lung cancer, squamous cell carcinoma presented with right lower lobe lung mass in addition to subcarinal lymphadenopathy diagnosed in October 2020.  PRIOR THERAPY: Weekly concurrent chemoradiation with Carboplatin for an AUC of 2 Paclitaxel 45 mg/m2. First dose 01/16/2019. Status post5cycles.  Last dose was given February 13, 2019.  CURRENT THERAPY:  Consolidation treatment with immunotherapy with Imfinzi 1500 mg IV every 4 weeks.  First dose April 05, 2019.  Status post 3 cycles.    INTERVAL HISTORY: Martin Bailey 79 y.o. male returns to the clinic today for follow-up visit. The patient is feeling much better today. He was treated with a tapered dose of prednisone over the last 5 weeks secondary to immunotherapy mediated pneumonitis. He is feeling much better regarding his breathing as well as a cough. He also has less itching. He denied having any current chest pain, shortness of breath or hemoptysis. He denied having any nausea, vomiting, diarrhea or constipation. He has no headache or visual changes. He had repeat CT scan of the chest performed recently and is here for evaluation and discussion of his scan results.  MEDICAL HISTORY: Past Medical History:  Diagnosis Date  . Cataract    left  . COLONIC POLYPS, HX OF 04/05/2007   ONE ADENOMATOUS POLYP AND ONE HYPERPLASTIC POLYP  . DEPRESSION 03/04/2007  . DERMATITIS, SEBORRHEIC 07/19/2007  . FIBRILLATION, ATRIAL 03/04/2007  . Hearing aid worn    B/L  . HYPERLIPIDEMIA 03/04/2007  . HYPERTENSION 03/04/2007  . Kidney stones   . Lung cancer ([place]) dx'd 02/2019   non small cell lung ca  . Lung mass    mediastinal adenopathy    ALLERGIES:  has No Known Allergies.  MEDICATIONS:  Current Outpatient Medications    Medication Sig Dispense Refill  . amLODipine (NORVASC) 5 MG tablet TAKE 1 TABLET BY MOUTH  DAILY 90 tablet 3  . atenolol (TENORMIN) 50 MG tablet Take 1 tablet (50 mg total) by mouth 2 (two) times daily. 180 tablet 3  . clobetasol (TEMOVATE) 0.05 % external solution Apply 1 application topically 2 (two) times daily. For seborrheic dermatitis- 7 days maximum 50 mL 0  . diphenhydrAMINE (BENADRYL) 25 MG tablet Take 25 mg by mouth every 6 (six) hours as needed.    . lidocaine-prilocaine (EMLA) cream Apply 1 application topically as needed. 30 g 0  . Multiple Vitamin (MULTIVITAMIN WITH MINERALS) TABS tablet Take 1 tablet by mouth daily.    . predniSONE (DELTASONE) 20 MG tablet 4 tablet p.o. daily for 1 week, followed by 3 tablet p.o. daily for 1 week, followed by 2 tablet p.o. daily for 1 week, followed by 1 tablet p.o. daily for 1 week followed by half tablet p.o. daily for 1 week. 74 tablet 0  . triamcinolone cream (KENALOG) 0.5 % Apply topically 2 (two) times daily as needed. 30 g 1  . XARELTO 20 MG TABS tablet TAKE 1 TABLET BY MOUTH  DAILY WITH SUPPER 90 tablet 3   No current facility-administered medications for this visit.    SURGICAL HISTORY:  Past Surgical History:  Procedure Laterality Date  . CATARACT EXTRACTION W/ INTRAOCULAR LENS IMPLANT     right  . COLONOSCOPY    . IR IMAGING GUIDED PORT INSERTION  01/31/2019  . RADIOFREQUENCY ABLATION  x2 for fib, on 2nd attempt switched form a flutter ot a fib.   Marland Kitchen ROTATOR CUFF REPAIR Right   . TONSILLECTOMY    . VIDEO BRONCHOSCOPY WITH ENDOBRONCHIAL ULTRASOUND Left 12/30/2018   Procedure: VIDEO BRONCHOSCOPY WITH ENDOBRONCHIAL ULTRASOUND WITH FLUOROSCOPY;  Surgeon: Laurin Coder, MD;  Location: [facility];  Service: Thoracic;  Laterality: Left;  . WISDOM TOOTH EXTRACTION      REVIEW OF SYSTEMS:  Constitutional: negative Eyes: negative Ears, nose, mouth, throat, and face: negative Respiratory: positive for cough Cardiovascular:  negative Gastrointestinal: negative Genitourinary:negative Integument/breast: negative Hematologic/lymphatic: negative Musculoskeletal:negative Neurological: negative Behavioral/Psych: negative Endocrine: negative Allergic/Immunologic: negative   PHYSICAL EXAMINATION: General appearance: alert, cooperative and no distress Head: Normocephalic, without obvious abnormality, atraumatic Neck: no adenopathy, no JVD, supple, symmetrical, trachea midline and thyroid not enlarged, symmetric, no tenderness/mass/nodules Lymph nodes: Cervical, supraclavicular, and axillary nodes normal. Resp: clear to auscultation bilaterally Back: symmetric, no curvature. ROM normal. No CVA tenderness. Cardio: regular rate and rhythm, S1, S2 normal, no murmur, click, rub or gallop GI: soft, non-tender; bowel sounds normal; no masses,  no organomegaly Extremities: extremities normal, atraumatic, no cyanosis or edema Neurologic: Alert and oriented X 3, normal strength and tone. Normal symmetric reflexes. Normal coordination and gait  ECOG PERFORMANCE STATUS: 1 - Symptomatic but completely ambulatory  Blood pressure 117/73, pulse 81, temperature 98.2 F (36.8 C), temperature source Temporal, resp. rate 18, height 5\' 10"  (1.778 m), weight 198 lb 3.2 oz (89.9 kg), SpO2 97 %.  LABORATORY DATA: Lab Results  Component Value Date   WBC 9.6 08/01/2019   HGB 13.9 08/01/2019   HCT 42.4 08/01/2019   MCV 96.1 08/01/2019   PLT 134 (L) 08/01/2019      Chemistry      Component Value Date/Time   NA 139 08/01/2019 0936   K 4.6 08/01/2019 0936   CL 104 08/01/2019 0936   CO2 25 08/01/2019 0936   BUN 11 08/01/2019 0936   CREATININE 1.04 08/01/2019 0936      Component Value Date/Time   CALCIUM 8.8 (L) 08/01/2019 0936   ALKPHOS 76 08/01/2019 0936   AST 25 08/01/2019 0936   ALT 27 08/01/2019 0936   BILITOT 0.6 08/01/2019 0936       RADIOGRAPHIC STUDIES: CT Chest W Contrast  Result Date: 08/02/2019 CLINICAL  DATA:  Primary Cancer Type: Lung Imaging indication: Restaging; reassess immunotherapy mediated pneumonitis. Interval therapy since last imaging? No Initial Cancer Diagnosis Date: 12/30/2018    Established by: Biopsy-proven Detailed Pathology: Stage IIIb non-small cell, squamous cell lung carcinoma. Primary Tumor location: Right lower lobe. Chemotherapy: Yes Ongoing? No Most recent: 02/13/2019 Immunotherapy? Yes type: Imfinzi Ongoing? Currently held after cycle 3 due to immunotherapy mediated pneumonitis. Radiation therapy? Yes Date Range: 01/01/2019-03/03/2019 Target: Right lower lobe. EXAM: CT CHEST WITH CONTRAST TECHNIQUE: Multidetector CT imaging of the chest was performed during intravenous contrast administration. CONTRAST:  69mL OMNIPAQUE IOHEXOL 300 MG/ML  SOLN COMPARISON:  Most recent CT chest 06/28/2019.  12/02/2018 PET-CT. FINDINGS: Cardiovascular: Normal heart size. Small pericardial effusion/thickening, decreased. Left anterior descending and left circumflex coronary atherosclerosis. Right internal jugular Port-A-Cath terminates at the cavoatrial junction. Atherosclerotic nonaneurysmal thoracic aorta. Normal caliber pulmonary arteries. No central pulmonary emboli. Mediastinum/Nodes: No discrete thyroid nodules. Unremarkable esophagus. No axillary adenopathy. Mildly enlarged high right mediastinal paraesophageal 1.0 cm node (series 2/image 28), previously 1.0 cm on 06/28/2019 chest CT, stable. Mildly enlarged 1.0 cm right subcarinal node (series 2/image 84), previously 1.1 cm, not appreciably changed. No new pathologically  enlarged mediastinal nodes. No hilar adenopathy. Lungs/Pleura: No pneumothorax. Small dependent right pleural effusion, slightly decreased. No left pleural effusion. Mild centrilobular and paraseptal emphysema with diffuse bronchial wall thickening. Spiculated central right lower lobe 4.3 x 3.4 cm lung mass (series 7/image 104), previously 4.9 x 3.6 cm, mildly decreased. Patchy  surrounding consolidation, ground-glass opacity and reticulation in the mid to lower right lung with increased volume loss and distortion, compatible with evolving postradiation change. No acute consolidative airspace disease or new significant pulmonary nodules. Upper abdomen: Scattered subcentimeter hypodense liver lesions are too small to characterize and are unchanged. Musculoskeletal: No aggressive appearing focal osseous lesions. Mild thoracic spondylosis. IMPRESSION: 1. Spiculated central right lower lobe lung mass is mildly decreased in size. Surrounding opacities with increasing volume loss are compatible with evolving postradiation change. 2. Stable mild mediastinal lymphadenopathy. No new or progressive metastatic disease in the chest. 3. Small dependent right pleural effusion, slightly decreased. 4. Small pericardial effusion/thickening, decreased. 5. Two-vessel coronary atherosclerosis. 6. Aortic Atherosclerosis (ICD10-I70.0) and Emphysema (ICD10-J43.9). Electronically Signed   By: Ilona Sorrel M.D.   On: 08/02/2019 10:05    ASSESSMENT AND PLAN: This is a very pleasant 79 years old white male with a stage IIIb non-small cell lung cancer, squamous cell carcinoma diagnosed in October 2020 status post course of concurrent chemoradiation with 5 cycles of chemotherapy with carboplatin and paclitaxel. The patient tolerated his treatment well except for pancytopenia. The patient had partial response after his treatment. He is currently undergoing consolidation treatment with immunotherapy with Imfinzi 1500 mg IV every 4 weeks status post 3 cycles.   His treatment has been on hold for the last 5 weeks secondary to immunotherapy mediated pneumonitis and the patient was treated with a tapered dose of prednisone. He felt much better. He had repeat CT scan of the chest performed recently. I personally and independently reviewed the scan images and discussed the results with the patient today. His scan  showed no significant disease progression and there was some improvement of the immunotherapy mediated pneumonitis. He continues to have radiation pneumonitis in the right lung. I recommended for the patient to resume his consolidation treatment with immunotherapy with Imfinzi and he will start cycle #4 next week. He will come back for follow-up visit in 5 weeks for evaluation before starting cycle #5. The patient was advised to call immediately if he has any concerning symptoms in the interval. The patient voices understanding of current disease status and treatment options and is in agreement with the current care plan.  All questions were answered. The patient knows to call the clinic with any problems, questions or concerns. We can certainly see the patient much sooner if necessary.  Disclaimer: This note was dictated with voice recognition software. Similar sounding words can inadvertently be transcribed and may not be corrected upon review.

## 2019-08-03 NOTE — Telephone Encounter (Signed)
Scheduled appt per 5/6 los - gave patient AVS and calender

## 2019-08-04 NOTE — Progress Notes (Signed)
Pharmacist Chemotherapy Monitoring - Follow Up Assessment    I verify that I have reviewed each item in the below checklist:  . Regimen for the patient is scheduled for the appropriate day and plan matches scheduled date. Marland Kitchen Appropriate non-routine labs are ordered dependent on drug ordered. . If applicable, additional medications reviewed and ordered per protocol based on lifetime cumulative doses and/or treatment regimen.   Plan for follow-up and/or issues identified: Yes . I-vent associated with next due treatment: Yes . MD and/or nursing notified: No   Kennith Center, Pharm.D., CPP 08/04/2019@2 :53 PM

## 2019-08-10 ENCOUNTER — Inpatient Hospital Stay: Payer: Medicare Other

## 2019-08-10 ENCOUNTER — Other Ambulatory Visit: Payer: Self-pay

## 2019-08-10 VITALS — BP 115/67 | HR 82 | Temp 98.1°F | Resp 18 | Ht 70.0 in | Wt 199.4 lb

## 2019-08-10 DIAGNOSIS — Z9221 Personal history of antineoplastic chemotherapy: Secondary | ICD-10-CM | POA: Diagnosis not present

## 2019-08-10 DIAGNOSIS — T451X5A Adverse effect of antineoplastic and immunosuppressive drugs, initial encounter: Secondary | ICD-10-CM | POA: Diagnosis not present

## 2019-08-10 DIAGNOSIS — Z79899 Other long term (current) drug therapy: Secondary | ICD-10-CM | POA: Diagnosis not present

## 2019-08-10 DIAGNOSIS — C3491 Malignant neoplasm of unspecified part of right bronchus or lung: Secondary | ICD-10-CM

## 2019-08-10 DIAGNOSIS — D61818 Other pancytopenia: Secondary | ICD-10-CM | POA: Diagnosis not present

## 2019-08-10 DIAGNOSIS — J704 Drug-induced interstitial lung disorders, unspecified: Secondary | ICD-10-CM | POA: Diagnosis not present

## 2019-08-10 DIAGNOSIS — D61811 Other drug-induced pancytopenia: Secondary | ICD-10-CM | POA: Diagnosis not present

## 2019-08-10 DIAGNOSIS — Z5112 Encounter for antineoplastic immunotherapy: Secondary | ICD-10-CM

## 2019-08-10 DIAGNOSIS — C3431 Malignant neoplasm of lower lobe, right bronchus or lung: Secondary | ICD-10-CM | POA: Diagnosis not present

## 2019-08-10 DIAGNOSIS — Z923 Personal history of irradiation: Secondary | ICD-10-CM | POA: Diagnosis not present

## 2019-08-10 LAB — CMP (CANCER CENTER ONLY)
ALT: 23 U/L (ref 0–44)
AST: 25 U/L (ref 15–41)
Albumin: 3.2 g/dL — ABNORMAL LOW (ref 3.5–5.0)
Alkaline Phosphatase: 90 U/L (ref 38–126)
Anion gap: 9 (ref 5–15)
BUN: 11 mg/dL (ref 8–23)
CO2: 22 mmol/L (ref 22–32)
Calcium: 8.5 mg/dL — ABNORMAL LOW (ref 8.9–10.3)
Chloride: 106 mmol/L (ref 98–111)
Creatinine: 0.94 mg/dL (ref 0.61–1.24)
GFR, Est AFR Am: >60 mL/min (ref >60)
GFR, Estimated: >60 mL/min (ref >60)
Glucose, Bld: 118 mg/dL — ABNORMAL HIGH (ref 70–99)
Potassium: 4.3 mmol/L (ref 3.5–5.1)
Sodium: 137 mmol/L (ref 135–145)
Total Bilirubin: 0.6 mg/dL (ref 0.3–1.2)
Total Protein: 6.5 g/dL (ref 6.5–8.1)

## 2019-08-10 LAB — CBC WITH DIFFERENTIAL (CANCER CENTER ONLY)
Abs Immature Granulocytes: 0.13 10*3/uL — ABNORMAL HIGH (ref 0.00–0.07)
Basophils Absolute: 0.1 10*3/uL (ref 0.0–0.1)
Basophils Relative: 1 %
Eosinophils Absolute: 0.1 10*3/uL (ref 0.0–0.5)
Eosinophils Relative: 1 %
HCT: 38.8 % — ABNORMAL LOW (ref 39.0–52.0)
Hemoglobin: 13.1 g/dL (ref 13.0–17.0)
Immature Granulocytes: 2 %
Lymphocytes Relative: 13 %
Lymphs Abs: 0.9 10*3/uL (ref 0.7–4.0)
MCH: 31.5 pg (ref 26.0–34.0)
MCHC: 33.8 g/dL (ref 30.0–36.0)
MCV: 93.3 fL (ref 80.0–100.0)
Monocytes Absolute: 1 10*3/uL (ref 0.1–1.0)
Monocytes Relative: 13 %
Neutro Abs: 5.2 10*3/uL (ref 1.7–7.7)
Neutrophils Relative %: 70 %
Platelet Count: 151 10*3/uL (ref 150–400)
RBC: 4.16 MIL/uL — ABNORMAL LOW (ref 4.22–5.81)
RDW: 14.3 % (ref 11.5–15.5)
WBC Count: 7.4 10*3/uL (ref 4.0–10.5)
nRBC: 0 % (ref 0.0–0.2)

## 2019-08-10 MED ORDER — SODIUM CHLORIDE 0.9 % IV SOLN
Freq: Once | INTRAVENOUS | Status: AC
  Filled 2019-08-10: qty 250

## 2019-08-10 MED ORDER — HEPARIN SOD (PORK) LOCK FLUSH 100 UNIT/ML IV SOLN
500.0000 [IU] | Freq: Once | INTRAVENOUS | Status: AC | PRN
  Administered 2019-08-10: 500 [IU]
  Filled 2019-08-10: qty 5

## 2019-08-10 MED ORDER — SODIUM CHLORIDE 0.9 % IV SOLN
1500.0000 mg | Freq: Once | INTRAVENOUS | Status: AC
  Administered 2019-08-10: 1500 mg via INTRAVENOUS
  Filled 2019-08-10: qty 30

## 2019-08-10 MED ORDER — HEPARIN SOD (PORK) LOCK FLUSH 100 UNIT/ML IV SOLN
500.0000 [IU] | Freq: Once | INTRAVENOUS | Status: DC | PRN
  Filled 2019-08-10: qty 5

## 2019-08-10 MED ORDER — SODIUM CHLORIDE 0.9% FLUSH
10.0000 mL | INTRAVENOUS | Status: DC | PRN
  Administered 2019-08-10: 10 mL via INTRAVENOUS
  Filled 2019-08-10: qty 10

## 2019-08-10 MED ORDER — SODIUM CHLORIDE 0.9% FLUSH
10.0000 mL | INTRAVENOUS | Status: DC | PRN
  Administered 2019-08-10: 10 mL
  Filled 2019-08-10: qty 10

## 2019-08-10 NOTE — Patient Instructions (Signed)

## 2019-08-10 NOTE — Patient Instructions (Signed)
[facility] Discharge Instructions for Patients Receiving Chemotherapy  Today you received the following chemotherapy agents/immunotherapy :  Durvalumab.  To help prevent nausea and vomiting after your treatment, we encourage you to take your nausea medication as prescribed.   If you develop nausea and vomiting that is not controlled by your nausea medication, call the clinic.   BELOW ARE SYMPTOMS THAT SHOULD BE REPORTED IMMEDIATELY:  *FEVER GREATER THAN 100.5 F  *CHILLS WITH OR WITHOUT FEVER  NAUSEA AND VOMITING THAT IS NOT CONTROLLED WITH YOUR NAUSEA MEDICATION  *UNUSUAL SHORTNESS OF BREATH  *UNUSUAL BRUISING OR BLEEDING  TENDERNESS IN MOUTH AND THROAT WITH OR WITHOUT PRESENCE OF ULCERS  *URINARY PROBLEMS  *BOWEL PROBLEMS  UNUSUAL RASH Items with * indicate a potential emergency and should be followed up as soon as possible.  Feel free to call the clinic should you have any questions or concerns. The clinic phone number is [phone].  Please show the [facility] at check-in to the Emergency Department and triage nurse.

## 2019-08-14 ENCOUNTER — Ambulatory Visit: Payer: Medicare Other | Admitting: Family Medicine

## 2019-08-14 DIAGNOSIS — Z0289 Encounter for other administrative examinations: Secondary | ICD-10-CM

## 2019-08-14 NOTE — Progress Notes (Signed)
  No-show for visit-we called several times with no answer

## 2019-08-15 NOTE — Patient Instructions (Signed)
Health Maintenance Due  Topic Date Due  . COVID-19 Vaccine (1) Never done  . COLONOSCOPY  07/28/2018    Depression screen [facility] 2/9 02/16/2019 06/20/2018 02/10/2018  Decreased Interest 0 2 2  Down, Depressed, Hopeless 0 2 3  PHQ - 2 Score 0 4 5  Altered sleeping 0 0 0  Tired, decreased energy 1 3 1   Change in appetite 0 0 0  Feeling bad or failure about yourself  0 1 1  Trouble concentrating 0 0 0  Moving slowly or fidgety/restless 0 0 0  Suicidal thoughts 0 1 0  PHQ-9 Score 1 9 7   Difficult doing work/chores Not difficult at all Not difficult at all Not difficult at all  Some recent data might be hidden    Recommended follow up: No follow-ups on file.

## 2019-08-22 ENCOUNTER — Telehealth: Payer: Medicare Other | Admitting: Family

## 2019-08-22 DIAGNOSIS — B9689 Other specified bacterial agents as the cause of diseases classified elsewhere: Secondary | ICD-10-CM | POA: Diagnosis not present

## 2019-08-22 DIAGNOSIS — J208 Acute bronchitis due to other specified organisms: Secondary | ICD-10-CM

## 2019-08-22 MED ORDER — BENZONATATE 100 MG PO CAPS: 100.0000 mg | ORAL_CAPSULE | Freq: Three times a day (TID) | ORAL | 0 refills | Status: DC | PRN

## 2019-08-22 MED ORDER — AZITHROMYCIN 250 MG PO TABS: ORAL_TABLET | ORAL | 0 refills | Status: DC

## 2019-08-22 MED ORDER — PREDNISONE 10 MG (21) PO TBPK: ORAL_TABLET | ORAL | 0 refills | Status: DC

## 2019-08-22 NOTE — Progress Notes (Signed)
We are sorry that you are not feeling well.  Here is how we plan to help!  Based on your presentation I believe you most likely have A cough due to bacteria.  When patients have a fever and a productive cough with a change in color or increased sputum production, we are concerned about bacterial bronchitis.  If left untreated it can progress to pneumonia.  If your symptoms do not improve with your treatment plan it is important that you contact your provider.   I have prescribed Azithromyin 250 mg: two tablets now and then one tablet daily for 4 additonal days    In addition you may use A non-prescription cough medication called Robitussin DAC. Take 2 teaspoons every 8 hours or Delsym: take 2 teaspoons every 12 hours., A non-prescription cough medication called Mucinex DM: take 2 tablets every 12 hours. and A prescription cough medication called Tessalon Perles 100mg . You may take 1-2 capsules every 8 hours as needed for your cough.  Prednisone 10 mg daily for 6 days (see taper instructions below)  Directions for 6 day taper: Day 1: 2 tablets before breakfast, 1 after both lunch & dinner and 2 at bedtime Day 2: 1 tab before breakfast, 1 after both lunch & dinner and 2 at bedtime Day 3: 1 tab at each meal & 1 at bedtime Day 4: 1 tab at breakfast, 1 at lunch, 1 at bedtime Day 5: 1 tab at breakfast & 1 tab at bedtime Day 6: 1 tab at breakfast   From your responses in the eVisit questionnaire you describe inflammation in the upper respiratory tract which is causing a significant cough.  This is commonly called Bronchitis and has four common causes:    Allergies  Viral Infections  Acid Reflux  Bacterial Infection Allergies, viruses and acid reflux are treated by controlling symptoms or eliminating the cause. An example might be a cough caused by taking certain blood pressure medications. You stop the cough by changing the medication. Another example might be a cough caused by acid reflux.  Controlling the reflux helps control the cough.  USE OF BRONCHODILATOR ("RESCUE") INHALERS: There is a risk from using your bronchodilator too frequently.  The risk is that over-reliance on a medication which only relaxes the muscles surrounding the breathing tubes can reduce the effectiveness of medications prescribed to reduce swelling and congestion of the tubes themselves.  Although you feel brief relief from the bronchodilator inhaler, your asthma may actually be worsening with the tubes becoming more swollen and filled with mucus.  This can delay other crucial treatments, such as oral steroid medications. If you need to use a bronchodilator inhaler daily, several times per day, you should discuss this with your provider.  There are probably better treatments that could be used to keep your asthma under control.     HOME CARE . Only take medications as instructed by your medical team. . Complete the entire course of an antibiotic. . Drink plenty of fluids and get plenty of rest. . Avoid close contacts especially the very young and the elderly . Cover your mouth if you cough or cough into your sleeve. . Always remember to wash your hands . A steam or ultrasonic humidifier can help congestion.   GET HELP RIGHT AWAY IF: . You develop worsening fever. . You become short of breath . You cough up blood. . Your symptoms persist after you have completed your treatment plan MAKE SURE YOU   Understand these instructions.  Will watch your condition.  Will get help right away if you are not doing well or get worse.  Your e-visit answers were reviewed by a board certified advanced clinical practitioner to complete your personal care plan.  Depending on the condition, your plan could have included both over the counter or prescription medications. If there is a problem please reply  once you have received a response from your provider. Your safety is important to [place].  If you have drug allergies  check your prescription carefully.    You can use MyChart to ask questions about today's visit, request a non-urgent call back, or ask for a work or school excuse for 24 hours related to this e-Visit. If it has been greater than 24 hours you will need to follow up with your provider, or enter a new e-Visit to address those concerns. You will get an e-mail in the next two days asking about your experience.  I hope that your e-visit has been valuable and will speed your recovery. Thank you for using e-visits.  Approximately 5 minutes was spent documenting and reviewing patient's chart.

## 2019-08-23 ENCOUNTER — Telehealth: Payer: Self-pay | Admitting: Internal Medicine

## 2019-08-23 NOTE — Telephone Encounter (Signed)
Faxed medical records to [facility] at [phone], Release ID: [id number]

## 2019-09-07 ENCOUNTER — Other Ambulatory Visit: Payer: Self-pay

## 2019-09-07 ENCOUNTER — Encounter: Payer: Self-pay | Admitting: Internal Medicine

## 2019-09-07 ENCOUNTER — Inpatient Hospital Stay: Payer: Medicare Other | Admitting: Internal Medicine

## 2019-09-07 ENCOUNTER — Inpatient Hospital Stay: Payer: Medicare Other

## 2019-09-07 ENCOUNTER — Inpatient Hospital Stay: Payer: Medicare Other | Attending: Internal Medicine

## 2019-09-07 VITALS — BP 105/65 | HR 70 | Temp 97.9°F | Resp 17 | Ht 70.0 in | Wt 194.0 lb

## 2019-09-07 DIAGNOSIS — I10 Essential (primary) hypertension: Secondary | ICD-10-CM

## 2019-09-07 DIAGNOSIS — C3491 Malignant neoplasm of unspecified part of right bronchus or lung: Secondary | ICD-10-CM

## 2019-09-07 DIAGNOSIS — Z5112 Encounter for antineoplastic immunotherapy: Secondary | ICD-10-CM | POA: Insufficient documentation

## 2019-09-07 DIAGNOSIS — C3431 Malignant neoplasm of lower lobe, right bronchus or lung: Secondary | ICD-10-CM | POA: Diagnosis not present

## 2019-09-07 DIAGNOSIS — C3490 Malignant neoplasm of unspecified part of unspecified bronchus or lung: Secondary | ICD-10-CM | POA: Diagnosis not present

## 2019-09-07 DIAGNOSIS — Z7901 Long term (current) use of anticoagulants: Secondary | ICD-10-CM | POA: Insufficient documentation

## 2019-09-07 DIAGNOSIS — E785 Hyperlipidemia, unspecified: Secondary | ICD-10-CM | POA: Diagnosis not present

## 2019-09-07 DIAGNOSIS — Z79899 Other long term (current) drug therapy: Secondary | ICD-10-CM | POA: Insufficient documentation

## 2019-09-07 DIAGNOSIS — D61818 Other pancytopenia: Secondary | ICD-10-CM | POA: Insufficient documentation

## 2019-09-07 LAB — CMP (CANCER CENTER ONLY)
ALT: 21 U/L (ref 0–44)
AST: 19 U/L (ref 15–41)
Albumin: 2.9 g/dL — ABNORMAL LOW (ref 3.5–5.0)
Alkaline Phosphatase: 114 U/L (ref 38–126)
Anion gap: 10 (ref 5–15)
BUN: 12 mg/dL (ref 8–23)
CO2: 24 mmol/L (ref 22–32)
Calcium: 8.9 mg/dL (ref 8.9–10.3)
Chloride: 101 mmol/L (ref 98–111)
Creatinine: 1.05 mg/dL (ref 0.61–1.24)
GFR, Est AFR Am: >60 mL/min (ref >60)
GFR, Estimated: >60 mL/min (ref >60)
Glucose, Bld: 140 mg/dL — ABNORMAL HIGH (ref 70–99)
Potassium: 4.1 mmol/L (ref 3.5–5.1)
Sodium: 135 mmol/L (ref 135–145)
Total Bilirubin: 1 mg/dL (ref 0.3–1.2)
Total Protein: 6.7 g/dL (ref 6.5–8.1)

## 2019-09-07 LAB — CBC WITH DIFFERENTIAL (CANCER CENTER ONLY)
Abs Immature Granulocytes: 0.1 10*3/uL — ABNORMAL HIGH (ref 0.00–0.07)
Basophils Absolute: 0.1 10*3/uL (ref 0.0–0.1)
Basophils Relative: 1 %
Eosinophils Absolute: 0.1 10*3/uL (ref 0.0–0.5)
Eosinophils Relative: 1 %
HCT: 37 % — ABNORMAL LOW (ref 39.0–52.0)
Hemoglobin: 12.5 g/dL — ABNORMAL LOW (ref 13.0–17.0)
Immature Granulocytes: 1 %
Lymphocytes Relative: 7 %
Lymphs Abs: 0.8 10*3/uL (ref 0.7–4.0)
MCH: 32 pg (ref 26.0–34.0)
MCHC: 33.8 g/dL (ref 30.0–36.0)
MCV: 94.6 fL (ref 80.0–100.0)
Monocytes Absolute: 1.3 10*3/uL — ABNORMAL HIGH (ref 0.1–1.0)
Monocytes Relative: 11 %
Neutro Abs: 9.2 10*3/uL — ABNORMAL HIGH (ref 1.7–7.7)
Neutrophils Relative %: 79 %
Platelet Count: 188 10*3/uL (ref 150–400)
RBC: 3.91 MIL/uL — ABNORMAL LOW (ref 4.22–5.81)
RDW: 14.2 % (ref 11.5–15.5)
WBC Count: 11.6 10*3/uL — ABNORMAL HIGH (ref 4.0–10.5)
nRBC: 0 % (ref 0.0–0.2)

## 2019-09-07 LAB — TSH: TSH: 2.239 u[IU]/mL (ref 0.320–4.118)

## 2019-09-07 MED ORDER — SODIUM CHLORIDE 0.9% FLUSH
10.0000 mL | INTRAVENOUS | Status: DC | PRN
  Administered 2019-09-07: 10 mL
  Filled 2019-09-07: qty 10

## 2019-09-07 MED ORDER — HYDROCODONE-HOMATROPINE 5-1.5 MG/5ML PO SYRP: 5.0000 mL | ORAL_SOLUTION | Freq: Four times a day (QID) | ORAL | 0 refills | Status: DC | PRN

## 2019-09-07 MED ORDER — HEPARIN SOD (PORK) LOCK FLUSH 100 UNIT/ML IV SOLN
500.0000 [IU] | Freq: Once | INTRAVENOUS | Status: AC | PRN
  Administered 2019-09-07: 500 [IU]
  Filled 2019-09-07: qty 5

## 2019-09-07 MED ORDER — SODIUM CHLORIDE 0.9 % IV SOLN
1500.0000 mg | Freq: Once | INTRAVENOUS | Status: AC
  Administered 2019-09-07: 1500 mg via INTRAVENOUS
  Filled 2019-09-07: qty 30

## 2019-09-07 MED ORDER — SODIUM CHLORIDE 0.9 % IV SOLN
Freq: Once | INTRAVENOUS | Status: AC
  Filled 2019-09-07: qty 250

## 2019-09-07 NOTE — Progress Notes (Signed)
[facility] [phone]   [phone]  OFFICE PROGRESS NOTE  [name], MD [place] [address]  DIAGNOSIS: Stage IIIb (T3, N2, M0) non-small cell lung cancer, squamous cell carcinoma presented with right lower lobe lung mass in addition to subcarinal lymphadenopathy diagnosed in October 2020.  PRIOR THERAPY: Weekly concurrent chemoradiation with Carboplatin for an AUC of 2 Paclitaxel 45 mg/m2. First dose 01/16/2019. Status post5cycles.  Last dose was given February 13, 2019.  CURRENT THERAPY:  Consolidation treatment with immunotherapy with Imfinzi 1500 mg IV every 4 weeks.  First dose April 05, 2019.  Status post 4 cycles.    INTERVAL HISTORY: Martin Bailey 79 y.o. male returns to the clinic today for follow-up visit.  The patient is feeling fine today with no concerning complaints except for the persistent cough that got worse after the last dose of treatment.  He mentions that start with a tickle and then he started coughing few times a day.  He denied having any significant chest pain or shortness of breath and no hemoptysis.  He denied having any fever or chills.  He has no nausea, vomiting, diarrhea or constipation.  He denied having any headache or visual changes.  The patient is here today for evaluation before starting cycle #5 of his treatment with immunotherapy.  MEDICAL HISTORY: Past Medical History:  Diagnosis Date  . Cataract    left  . COLONIC POLYPS, HX OF 04/05/2007   ONE ADENOMATOUS POLYP AND ONE HYPERPLASTIC POLYP  . DEPRESSION 03/04/2007  . DERMATITIS, SEBORRHEIC 07/19/2007  . FIBRILLATION, ATRIAL 03/04/2007  . Hearing aid worn    B/L  . HYPERLIPIDEMIA 03/04/2007  . HYPERTENSION 03/04/2007  . Kidney stones   . Lung cancer ([place]) dx'd 02/2019   non small cell lung ca  . Lung mass    mediastinal adenopathy    ALLERGIES:  has No Known Allergies.  MEDICATIONS:  Current Outpatient  Medications  Medication Sig Dispense Refill  . amLODipine (NORVASC) 5 MG tablet TAKE 1 TABLET BY MOUTH  DAILY 90 tablet 3  . atenolol (TENORMIN) 50 MG tablet Take 1 tablet (50 mg total) by mouth 2 (two) times daily. 180 tablet 3  . clobetasol (TEMOVATE) 0.05 % external solution Apply 1 application topically 2 (two) times daily. For seborrheic dermatitis- 7 days maximum 50 mL 0  . lidocaine-prilocaine (EMLA) cream Apply 1 application topically as needed. 30 g 0  . Multiple Vitamin (MULTIVITAMIN WITH MINERALS) TABS tablet Take 1 tablet by mouth daily.    Marland Kitchen triamcinolone cream (KENALOG) 0.5 % Apply topically 2 (two) times daily as needed. 30 g 1  . XARELTO 20 MG TABS tablet TAKE 1 TABLET BY MOUTH  DAILY WITH SUPPER 90 tablet 3   No current facility-administered medications for this visit.    SURGICAL HISTORY:  Past Surgical History:  Procedure Laterality Date  . CATARACT EXTRACTION W/ INTRAOCULAR LENS IMPLANT     right  . COLONOSCOPY    . IR IMAGING GUIDED PORT INSERTION  01/31/2019  . RADIOFREQUENCY ABLATION     x2 for fib, on 2nd attempt switched form a flutter ot a fib.   Marland Kitchen ROTATOR CUFF REPAIR Right   . TONSILLECTOMY    . VIDEO BRONCHOSCOPY WITH ENDOBRONCHIAL ULTRASOUND Left 12/30/2018   Procedure: VIDEO BRONCHOSCOPY WITH ENDOBRONCHIAL ULTRASOUND WITH FLUOROSCOPY;  Surgeon: Laurin Coder, MD;  Location: [facility];  Service: Thoracic;  Laterality: Left;  . WISDOM  TOOTH EXTRACTION      REVIEW OF SYSTEMS:  A comprehensive review of systems was negative except for: Constitutional: positive for fatigue Respiratory: positive for cough   PHYSICAL EXAMINATION: General appearance: alert, cooperative and no distress Head: Normocephalic, without obvious abnormality, atraumatic Neck: no adenopathy, no JVD, supple, symmetrical, trachea midline and thyroid not enlarged, symmetric, no tenderness/mass/nodules Lymph nodes: Cervical, supraclavicular, and axillary nodes normal. Resp: rales  bilaterally and wheezes bilaterally Back: symmetric, no curvature. ROM normal. No CVA tenderness. Cardio: regular rate and rhythm, S1, S2 normal, no murmur, click, rub or gallop GI: soft, non-tender; bowel sounds normal; no masses,  no organomegaly Extremities: extremities normal, atraumatic, no cyanosis or edema  ECOG PERFORMANCE STATUS: 1 - Symptomatic but completely ambulatory  Blood pressure 105/65, pulse 70, temperature 97.9 F (36.6 C), temperature source Temporal, resp. rate 17, height 5\' 10"  (1.778 m), weight 194 lb (88 kg), SpO2 99 %.  LABORATORY DATA: Lab Results  Component Value Date   WBC 11.6 (H) 09/07/2019   HGB 12.5 (L) 09/07/2019   HCT 37.0 (L) 09/07/2019   MCV 94.6 09/07/2019   PLT 188 09/07/2019      Chemistry      Component Value Date/Time   NA 135 09/07/2019 0845   K 4.1 09/07/2019 0845   CL 101 09/07/2019 0845   CO2 24 09/07/2019 0845   BUN 12 09/07/2019 0845   CREATININE 1.05 09/07/2019 0845      Component Value Date/Time   CALCIUM 8.9 09/07/2019 0845   ALKPHOS 114 09/07/2019 0845   AST 19 09/07/2019 0845   ALT 21 09/07/2019 0845   BILITOT 1.0 09/07/2019 0845       RADIOGRAPHIC STUDIES: No results found.  ASSESSMENT AND PLAN: This is a very pleasant 79 years old white male with a stage IIIb non-small cell lung cancer, squamous cell carcinoma diagnosed in October 2020 status post course of concurrent chemoradiation with 5 cycles of chemotherapy with carboplatin and paclitaxel. The patient tolerated his treatment well except for pancytopenia. The patient had partial response after his treatment. He is currently undergoing consolidation treatment with immunotherapy with Imfinzi 1500 mg IV every 4 weeks status post 4  cycles.   He has been tolerating this treatment well except for the persistent cough.  This could be secondary to the radiation induced pneumonitis plus minus immunotherapy mediated pneumonitis. He declined having any shortness of  breath or chest pain. I had a lengthy discussion with the patient today about his condition including continuous treatment as well as prescription of cough medication versus stopping his treatment with immunotherapy. The patient is still interested in proceeding with the immunotherapy. He will proceed with cycle #5 today as planned. For the cough I will start him on Hycodan 5 mL p.o. every 6 hours as needed for cough. I will arrange for the patient to have repeat CT scan of the chest before his next dose of treatment to rule out any worsening pneumonitis. The patient was advised to call immediately if he has any concerning symptoms in the interval.  The patient voices understanding of current disease status and treatment options and is in agreement with the current care plan.  All questions were answered. The patient knows to call the clinic with any problems, questions or concerns. We can certainly see the patient much sooner if necessary.  Disclaimer: This note was dictated with voice recognition software. Similar sounding words can inadvertently be transcribed and may not be corrected upon review.

## 2019-09-07 NOTE — Patient Instructions (Signed)

## 2019-09-07 NOTE — Patient Instructions (Signed)
[facility] Discharge Instructions for Patients Receiving Chemotherapy  Today you received the following chemotherapy agents/immunotherapy :  Durvalumab.  To help prevent nausea and vomiting after your treatment, we encourage you to take your nausea medication as prescribed.   If you develop nausea and vomiting that is not controlled by your nausea medication, call the clinic.   BELOW ARE SYMPTOMS THAT SHOULD BE REPORTED IMMEDIATELY:  *FEVER GREATER THAN 100.5 F  *CHILLS WITH OR WITHOUT FEVER  NAUSEA AND VOMITING THAT IS NOT CONTROLLED WITH YOUR NAUSEA MEDICATION  *UNUSUAL SHORTNESS OF BREATH  *UNUSUAL BRUISING OR BLEEDING  TENDERNESS IN MOUTH AND THROAT WITH OR WITHOUT PRESENCE OF ULCERS  *URINARY PROBLEMS  *BOWEL PROBLEMS  UNUSUAL RASH Items with * indicate a potential emergency and should be followed up as soon as possible.  Feel free to call the clinic should you have any questions or concerns. The clinic phone number is [phone].  Please show the [facility] at check-in to the Emergency Department and triage nurse.

## 2019-09-08 ENCOUNTER — Encounter: Payer: Self-pay | Admitting: Family Medicine

## 2019-09-08 ENCOUNTER — Ambulatory Visit (INDEPENDENT_AMBULATORY_CARE_PROVIDER_SITE_OTHER): Payer: Medicare Other | Admitting: Family Medicine

## 2019-09-08 VITALS — BP 108/68 | HR 91 | Temp 98.4°F | Ht 70.0 in | Wt 196.0 lb

## 2019-09-08 DIAGNOSIS — I4811 Longstanding persistent atrial fibrillation: Secondary | ICD-10-CM | POA: Diagnosis not present

## 2019-09-08 DIAGNOSIS — R7989 Other specified abnormal findings of blood chemistry: Secondary | ICD-10-CM

## 2019-09-08 DIAGNOSIS — I10 Essential (primary) hypertension: Secondary | ICD-10-CM

## 2019-09-08 DIAGNOSIS — C3491 Malignant neoplasm of unspecified part of right bronchus or lung: Secondary | ICD-10-CM

## 2019-09-08 DIAGNOSIS — F1020 Alcohol dependence, uncomplicated: Secondary | ICD-10-CM

## 2019-09-08 DIAGNOSIS — F3342 Major depressive disorder, recurrent, in full remission: Secondary | ICD-10-CM

## 2019-09-08 DIAGNOSIS — E785 Hyperlipidemia, unspecified: Secondary | ICD-10-CM

## 2019-09-08 NOTE — Patient Instructions (Addendum)
Health Maintenance Due  Topic Date Due  . Hepatitis C Screening never had  Never done  . COVID-19 Vaccine (1) will call and give information  Never done  . COLONOSCOPY holding off for now  07/28/2018   Cough: a good over the counter cough medication is anything with a DM. Like delsym DM. It may not be more helpful than the tessalon pearls that you have tried in the past.   I agree that the psychiatrist follow up would be a good idea.    Blood pressures: your numbers have been so low last few checks if you want to decrease the amlodipine 2.5 mg (1/2 tab). If your readings get above 135/80 let our office know and we ill go back to whole tablet.   If you want to add a boost or ensure a day to help increase your protein intake. You may want to take a b-12 over the counter it may be helpful with your energy.   Recommended follow up: No follow-ups on file.

## 2019-09-08 NOTE — Progress Notes (Signed)
Phone [phone] In person visit   Subjective:   Martin Bailey is a 79 y.o. year old very pleasant male patient who presents for/with See problem oriented charting Chief Complaint  Patient presents with  . Hypertension  . Hyperlipidemia   This visit occurred during the SARS-CoV-2 public health emergency.  Safety protocols were in place, including screening questions prior to the visit, additional usage of staff PPE, and extensive cleaning of exam room while observing appropriate contact time as indicated for disinfecting solutions.   Past Medical History-  Patient Active Problem List   Diagnosis Date Noted  . Pneumonitis, interstitial ([place]) 06/29/2019    Priority: High  . Stage III squamous cell carcinoma of right lung ([place]) 01/06/2019    Priority: High  . Alcoholism ([place]) 06/21/2014    Priority: High  . Atrial fibrillation ([place]) 03/04/2007    Priority: High  . Elevated TSH 09/21/2014    Priority: Medium  . Seborrheic dermatitis 07/19/2007    Priority: Medium  . Hyperlipidemia 03/04/2007    Priority: Medium  . Major depression ([place]) 03/04/2007    Priority: Medium  . Essential hypertension 03/04/2007    Priority: Medium  . Former smoker 06/21/2014    Priority: Low  . Allergic rhinitis 04/06/2014    Priority: Low  . GERD (gastroesophageal reflux disease) 04/06/2014    Priority: Low  . Hx of adenomatous colonic polyps 03/05/2014    Priority: Low  . Chronic anticoagulation 03/05/2014    Priority: Low  . Testosterone deficiency 09/02/2010    Priority: Low  . Encounter for antineoplastic immunotherapy 03/29/2019  . Chemotherapy-induced thrombocytopenia 02/20/2019  . Encounter for antineoplastic chemotherapy 01/06/2019  . Goals of care, counseling/discussion 01/06/2019  . Laryngopharyngeal reflux (LPR) 09/12/2018  . Cough 09/12/2018  . Disorientation 02/10/2018  . Overweight 08/09/2017    Medications- reviewed and updated Current Outpatient Medications    Medication Sig Dispense Refill  . amLODipine (NORVASC) 5 MG tablet TAKE 1 TABLET BY MOUTH  DAILY 90 tablet 3  . atenolol (TENORMIN) 50 MG tablet Take 1 tablet (50 mg total) by mouth 2 (two) times daily. 180 tablet 3  . clobetasol (TEMOVATE) 0.05 % external solution Apply 1 application topically 2 (two) times daily. For seborrheic dermatitis- 7 days maximum 50 mL 0  . HYDROcodone-homatropine (HYCODAN) 5-1.5 MG/5ML syrup Take 5 mLs by mouth every 6 (six) hours as needed for cough. 240 mL 0  . lidocaine-prilocaine (EMLA) cream Apply 1 application topically as needed. 30 g 0  . Multiple Vitamin (MULTIVITAMIN WITH MINERALS) TABS tablet Take 1 tablet by mouth daily.    Marland Kitchen triamcinolone cream (KENALOG) 0.5 % Apply topically 2 (two) times daily as needed. 30 g 1  . XARELTO 20 MG TABS tablet TAKE 1 TABLET BY MOUTH  DAILY WITH SUPPER 90 tablet 3   No current facility-administered medications for this visit.     Objective:  BP 108/68   Pulse 91   Temp 98.4 F (36.9 C) (Temporal)   Ht 5\' 10"  (1.778 m)   Wt 196 lb (88.9 kg)   SpO2 93%   BMI 28.12 kg/m  Gen: NAD, resting comfortably CV: RRR no murmurs rubs or gallops Lungs: CTAB no crackles, wheeze, rhonchi Abdomen: soft/nontender/nondistended/normal bowel sounds. No rebound or guarding.  Ext: trace edema Skin: warm, dry    Assessment and Plan   # stage III squamous cell carcinoma of the lungs-diagnosed October 2020-status post course of concurrent chemoradiation with 5 cycles of chemotherapy.  Did develop pancytopenia.  Patient had partial response with initial treatment and underwent consolidation treatment with immunotherapy Imfinzi now status post 4 cycles.  He has been doing well except for some persistent cough-possibly thought related to radiation-induced pneumonitis versus immunotherapy mediated pneumonitis.  Just saw oncology yesterday.  He was treated with round 5 of immunotherapy yesterday- one a month until January.  -Hycodan was  given and repeat chest CT was planned- has only had 3 doses so far -cough drops don't help - tessalon was not super helpful.  -antibiotics help short term  #Persistent atrial fibrillation S: Rate controlled with atenolol Anticoagulated with Xarelto Patient is not followed by cardiology:   A/P: Rate controlled and appropriately anticoagulated-continue current medication  #hypertension S: medication: Amlodipine 5mg  and atenolol 50mg   Home readings #s:  home readings are close to what we have gotten in office.  BP Readings from Last 3 Encounters:  09/08/19 108/68  09/07/19 105/65  08/10/19 115/67  A/P:  Stable. Continue current medications except trial amlodipine 2.5mg  unless bp increases over 135/85   #hyperlipidemia S: Medication:None Lab Results  Component Value Date   CHOL 159 02/10/2018   HDL 48.50 02/10/2018   LDLCALC 100 (H) 02/10/2018   LDLDIRECT 91.0 02/09/2017   TRIG 56.0 02/10/2018   CHOLHDL 3 02/10/2018   A/P: Poor control last check-we opted to repeat lipid panel in 6 months-that will give [place] a better view on how things are going with his lung cancer and whether we should pursue minimizing cardiovascular risk   # Depression-his main desire is for companionship but does not believe this will happen S: Medication:None.  Medications not beneficial in the past.  Also has done counseling and did not find this particularly effective -did see psychiatry in the past and may return to see them -[facility] -does enjoy bridge 4x a great -Low testosterone previously treated and did not help with depression either Depression screen [facility] 2/9 09/08/2019 02/16/2019 06/20/2018  Decreased Interest 3 0 2  Down, Depressed, Hopeless 3 0 2  PHQ - 2 Score 6 0 4  Altered sleeping 0 0 0  Tired, decreased energy 3 1 3   Change in appetite 0 0 0  Feeling bad or failure about yourself  1 0 1  Trouble concentrating 0 0 0  Moving slowly or fidgety/restless 0 0 0   Suicidal thoughts 0 0 1  PHQ-9 Score 10 1 9   Difficult doing work/chores Somewhat difficult Not difficult at all Not difficult at all  Some recent data might be hidden  A/P: Poor control-patient is considering following up with psychiatry.  He did not find psychology visits very helpful in the past.  Thankfully no suicidal ideation-if patient has worsening symptoms would ask that he update [place]  #Alcoholism-currently drinking 3-4 beverages per day but he is not interested in changing.  He asks me not to press about this issue-we will respect his wishes  #Mildly elevated TSH-looks good on last check and has been followed by oncology Lab Results  Component Value Date   TSH 2.239 09/07/2019   Recommended follow up: 6 months physical recommended Future Appointments  Date Time Provider [place]  10/05/2019 10:00 AM [facility] LAB 3 [facility] None  10/05/2019 10:15 AM [facility] [place] [facility] None  10/05/2019 10:45 AM Curt Bears, MD [facility] None  10/05/2019 11:30 AM [facility] INFUSION [facility] None  02/29/2020  2:40 PM [name], MD [facility]    Lab/Order associations:   ICD-10-CM  1. Longstanding persistent atrial fibrillation ([facility])  I48.11   2. Recurrent major depressive disorder, in full remission ([place])  F33.42   3. Hyperlipidemia, unspecified hyperlipidemia type  E78.5   4. Essential hypertension  I10   5. Elevated TSH  R79.89   6. Alcoholism ([place]) Chronic F10.20   7. Stage III squamous cell carcinoma of right lung ([facility])  C34.91     Return precautions advised.  Garret Reddish, MD

## 2019-09-12 ENCOUNTER — Encounter: Payer: Self-pay | Admitting: Internal Medicine

## 2019-09-15 ENCOUNTER — Telehealth: Payer: Self-pay | Admitting: Medical Oncology

## 2019-09-15 NOTE — Telephone Encounter (Signed)
-----   Message from Curt Bears, MD sent at 09/14/2019  8:10 AM EDT ----- Regarding: RE: cough The next step is to hold his immunotherapy and start him on high-dose prednisone.  Unfortunately all the immunotherapy will have very similar adverse effects. ----- Message ----- From: Ardeen Garland, RN Sent: 09/13/2019   4:32 PM EDT To: Curt Bears, MD Subject: cough                                          "Martin Handler "Randy"  [facility] Onc Nurse Cc Dr. Julien Nordmann,  I regret to say that the hydrocodone produced unfortunate results. It did stop the cough, but it also completely killed my appetite, 100%. By Friday afternoon I had zero appetite, couldn't eat, didn't even want food, found it repugnant. When this problem continued through Saturday and I still hadn't eaten a real meal I discontinued the opioid. I have tried using dextromethorphan, but we know that it doesn't work very well. I am finally able to eat again, but I still don't have a real appetite and don't enjoy food.   I don't know what we must do about this cough. It is severe enough that I don't think I can tolerate it indefinitely. I know we said that suspending treatment is a last resort, and it still is, but is there another answer? Is there another immunotherapy drug that possibly doesn't cause a cough? Is there any other treatment that would help? I will appreciate any advice you can give me about this problem.   JRM "

## 2019-09-15 NOTE — Telephone Encounter (Signed)
I left this message on pts home phone. Asked him to return my call and let me know what he wants to do.

## 2019-09-18 ENCOUNTER — Telehealth: Payer: Self-pay | Admitting: Medical Oncology

## 2019-09-18 NOTE — Telephone Encounter (Signed)
He is concerned about what is root cause of coughing? appt this week to discuss.

## 2019-09-19 ENCOUNTER — Encounter: Payer: Self-pay | Admitting: Internal Medicine

## 2019-09-19 ENCOUNTER — Inpatient Hospital Stay: Payer: Medicare Other | Admitting: Internal Medicine

## 2019-09-19 ENCOUNTER — Other Ambulatory Visit: Payer: Self-pay

## 2019-09-19 VITALS — BP 111/67 | HR 80 | Temp 97.9°F | Resp 18 | Ht 70.0 in | Wt 190.7 lb

## 2019-09-19 DIAGNOSIS — Z79899 Other long term (current) drug therapy: Secondary | ICD-10-CM | POA: Diagnosis not present

## 2019-09-19 DIAGNOSIS — J189 Pneumonia, unspecified organism: Secondary | ICD-10-CM

## 2019-09-19 DIAGNOSIS — C3491 Malignant neoplasm of unspecified part of right bronchus or lung: Secondary | ICD-10-CM | POA: Diagnosis not present

## 2019-09-19 DIAGNOSIS — E785 Hyperlipidemia, unspecified: Secondary | ICD-10-CM | POA: Diagnosis not present

## 2019-09-19 DIAGNOSIS — C3431 Malignant neoplasm of lower lobe, right bronchus or lung: Secondary | ICD-10-CM | POA: Diagnosis not present

## 2019-09-19 DIAGNOSIS — Z7901 Long term (current) use of anticoagulants: Secondary | ICD-10-CM | POA: Diagnosis not present

## 2019-09-19 DIAGNOSIS — Z5112 Encounter for antineoplastic immunotherapy: Secondary | ICD-10-CM

## 2019-09-19 DIAGNOSIS — T50905A Adverse effect of unspecified drugs, medicaments and biological substances, initial encounter: Secondary | ICD-10-CM

## 2019-09-19 DIAGNOSIS — I10 Essential (primary) hypertension: Secondary | ICD-10-CM | POA: Diagnosis not present

## 2019-09-19 DIAGNOSIS — D61818 Other pancytopenia: Secondary | ICD-10-CM | POA: Diagnosis not present

## 2019-09-19 MED ORDER — PREDNISONE 20 MG PO TABS
ORAL_TABLET | ORAL | 0 refills | Status: DC
Start: 2019-09-19 — End: 2019-12-07

## 2019-09-19 NOTE — Progress Notes (Signed)
[facility] [phone]   [phone]  OFFICE PROGRESS NOTE  [name], MD [place] [address]  DIAGNOSIS: Stage IIIb (T3, N2, M0) non-small cell lung cancer, squamous cell carcinoma presented with right lower lobe lung mass in addition to subcarinal lymphadenopathy diagnosed in October 2020.  PRIOR THERAPY: Weekly concurrent chemoradiation with Carboplatin for an AUC of 2 Paclitaxel 45 mg/m2. First dose 01/16/2019. Status post5cycles.  Last dose was given February 13, 2019.  CURRENT THERAPY:  Consolidation treatment with immunotherapy with Imfinzi 1500 mg IV every 4 weeks.  First dose April 05, 2019.  Status post 5 cycles.  Treatment is currently on hold secondary to suspicious immunotherapy mediated pneumonitis.  INTERVAL HISTORY: Martin Bailey 79 y.o. male returns to the clinic today for follow-up visit.  The patient requested this visit because of his persistent cough that is not responding to the cough medication.  He could not tolerate the Hycodan. He denied having any current chest pain but has shortness of breath with exertion with no hemoptysis.  He denied having any fever or chills.  He has no nausea, vomiting, diarrhea or constipation.  He denied having any headache or visual changes.  He has no recent weight loss or night sweats.  MEDICAL HISTORY: Past Medical History:  Diagnosis Date  . Cataract    left  . COLONIC POLYPS, HX OF 04/05/2007   ONE ADENOMATOUS POLYP AND ONE HYPERPLASTIC POLYP  . DEPRESSION 03/04/2007  . DERMATITIS, SEBORRHEIC 07/19/2007  . FIBRILLATION, ATRIAL 03/04/2007  . Hearing aid worn    B/L  . HYPERLIPIDEMIA 03/04/2007  . HYPERTENSION 03/04/2007  . Kidney stones   . Lung cancer ([facility]) dx'd 02/2019   non small cell lung ca  . Lung mass    mediastinal adenopathy    ALLERGIES:  is allergic to hydrocodone.  MEDICATIONS:  Current Outpatient Medications  Medication Sig  Dispense Refill  . amLODipine (NORVASC) 5 MG tablet TAKE 1 TABLET BY MOUTH  DAILY 90 tablet 3  . atenolol (TENORMIN) 50 MG tablet Take 1 tablet (50 mg total) by mouth 2 (two) times daily. 180 tablet 3  . clobetasol (TEMOVATE) 0.05 % external solution Apply 1 application topically 2 (two) times daily. For seborrheic dermatitis- 7 days maximum 50 mL 0  . HYDROcodone-homatropine (HYCODAN) 5-1.5 MG/5ML syrup Take 5 mLs by mouth every 6 (six) hours as needed for cough. 240 mL 0  . lidocaine-prilocaine (EMLA) cream Apply 1 application topically as needed. 30 g 0  . Multiple Vitamin (MULTIVITAMIN WITH MINERALS) TABS tablet Take 1 tablet by mouth daily.    Marland Kitchen triamcinolone cream (KENALOG) 0.5 % Apply topically 2 (two) times daily as needed. 30 g 1  . XARELTO 20 MG TABS tablet TAKE 1 TABLET BY MOUTH  DAILY WITH SUPPER 90 tablet 3   No current facility-administered medications for this visit.    SURGICAL HISTORY:  Past Surgical History:  Procedure Laterality Date  . CATARACT EXTRACTION W/ INTRAOCULAR LENS IMPLANT     right  . COLONOSCOPY    . IR IMAGING GUIDED PORT INSERTION  01/31/2019  . RADIOFREQUENCY ABLATION     x2 for fib, on 2nd attempt switched form a flutter ot a fib.   Marland Kitchen ROTATOR CUFF REPAIR Right   . TONSILLECTOMY    . VIDEO BRONCHOSCOPY WITH ENDOBRONCHIAL ULTRASOUND Left 12/30/2018   Procedure: VIDEO BRONCHOSCOPY WITH ENDOBRONCHIAL ULTRASOUND WITH FLUOROSCOPY;  Surgeon: Laurin Coder, MD;  Location: [facility];  Service: Thoracic;  Laterality: Left;  . WISDOM TOOTH EXTRACTION      REVIEW OF SYSTEMS:  Constitutional: negative Eyes: negative Ears, nose, mouth, throat, and face: negative Respiratory: positive for cough and dyspnea on exertion Cardiovascular: negative Gastrointestinal: negative Genitourinary:negative Integument/breast: negative Hematologic/lymphatic: negative Musculoskeletal:negative Neurological: negative Behavioral/Psych: negative Endocrine:  negative Allergic/Immunologic: negative   PHYSICAL EXAMINATION: General appearance: alert, cooperative and no distress Head: Normocephalic, without obvious abnormality, atraumatic Neck: no adenopathy, no JVD, supple, symmetrical, trachea midline and thyroid not enlarged, symmetric, no tenderness/mass/nodules Lymph nodes: Cervical, supraclavicular, and axillary nodes normal. Resp: rales bilaterally Back: symmetric, no curvature. ROM normal. No CVA tenderness. Cardio: regular rate and rhythm, S1, S2 normal, no murmur, click, rub or gallop GI: soft, non-tender; bowel sounds normal; no masses,  no organomegaly Extremities: extremities normal, atraumatic, no cyanosis or edema Neurologic: Alert and oriented X 3, normal strength and tone. Normal symmetric reflexes. Normal coordination and gait  ECOG PERFORMANCE STATUS: 1 - Symptomatic but completely ambulatory  Blood pressure 111/67, pulse 80, temperature 97.9 F (36.6 C), temperature source Temporal, resp. rate 18, height 5\' 10"  (1.778 m), weight 190 lb 11.2 oz (86.5 kg).  LABORATORY DATA: Lab Results  Component Value Date   WBC 11.6 (H) 09/07/2019   HGB 12.5 (L) 09/07/2019   HCT 37.0 (L) 09/07/2019   MCV 94.6 09/07/2019   PLT 188 09/07/2019      Chemistry      Component Value Date/Time   NA 135 09/07/2019 0845   K 4.1 09/07/2019 0845   CL 101 09/07/2019 0845   CO2 24 09/07/2019 0845   BUN 12 09/07/2019 0845   CREATININE 1.05 09/07/2019 0845      Component Value Date/Time   CALCIUM 8.9 09/07/2019 0845   ALKPHOS 114 09/07/2019 0845   AST 19 09/07/2019 0845   ALT 21 09/07/2019 0845   BILITOT 1.0 09/07/2019 0845       RADIOGRAPHIC STUDIES: No results found.  ASSESSMENT AND PLAN: This is a very pleasant 79 years old white male with a stage IIIb non-small cell lung cancer, squamous cell carcinoma diagnosed in October 2020 status post course of concurrent chemoradiation with 5 cycles of chemotherapy with carboplatin and  paclitaxel. The patient tolerated his treatment well except for pancytopenia. The patient had partial response after his treatment. He is currently undergoing consolidation treatment with immunotherapy with Imfinzi 1500 mg IV every 4 weeks status post 5  cycles.   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 His cough is bothering him too much and he cannot rest enough because of the persistent cough. I had a lengthy discussion with the patient today about his condition and treatment options.  I strongly recommend for the patient to hold his immunotherapy for now and start a tapered dose of prednisone over the next several weeks. The patient agreed to the current plan and he started with prednisone 80 mg p.o. daily for 2 weeks followed by 60 mg p.o. daily for 1 week followed by 40 mg p.o. daily for 1 week followed by 20 mg p.o. daily 1 week then 10 mg p.o. daily for 1 week.  He will also continue on over-the-counter cough medication for now. I will see the patient back for follow-up visit in 3 weeks for evaluation. For GI prophylaxis I advised the patient to take omeprazole 1 capsule p.o. daily. The patient was advised to call immediately if he  has any concerning symptoms in the interval. The patient voices understanding of current disease status and treatment options and is in agreement with the current care plan.  All questions were answered. The patient knows to call the clinic with any problems, questions or concerns. We can certainly see the patient much sooner if necessary.  Disclaimer: This note was dictated with voice recognition software. Similar sounding words can inadvertently be transcribed and may not be corrected upon review.

## 2019-10-04 ENCOUNTER — Ambulatory Visit (HOSPITAL_COMMUNITY)
Admission: RE | Admit: 2019-10-04 | Discharge: 2019-10-04 | Disposition: A | Discharge status: Home / Self Care | Payer: Medicare Other | Source: Ambulatory Visit | Attending: Internal Medicine | Admitting: Internal Medicine

## 2019-10-04 ENCOUNTER — Encounter (HOSPITAL_COMMUNITY): Payer: Self-pay

## 2019-10-04 ENCOUNTER — Other Ambulatory Visit: Payer: Self-pay

## 2019-10-04 DIAGNOSIS — I700 Atherosclerosis of aorta: Secondary | ICD-10-CM | POA: Diagnosis not present

## 2019-10-04 DIAGNOSIS — C3490 Malignant neoplasm of unspecified part of unspecified bronchus or lung: Secondary | ICD-10-CM | POA: Diagnosis not present

## 2019-10-04 DIAGNOSIS — Z5111 Encounter for antineoplastic chemotherapy: Secondary | ICD-10-CM | POA: Diagnosis not present

## 2019-10-04 DIAGNOSIS — J90 Pleural effusion, not elsewhere classified: Secondary | ICD-10-CM | POA: Diagnosis not present

## 2019-10-04 MED ORDER — IOHEXOL 300 MG/ML  SOLN
75.0000 mL | Freq: Once | INTRAMUSCULAR | Status: AC | PRN
  Administered 2019-10-04: 75 mL via INTRAVENOUS

## 2019-10-04 MED ORDER — SODIUM CHLORIDE (PF) 0.9 % IJ SOLN
INTRAMUSCULAR | Status: AC
  Filled 2019-10-04: qty 50

## 2019-10-04 MED ORDER — HEPARIN SOD (PORK) LOCK FLUSH 100 UNIT/ML IV SOLN
500.0000 [IU] | Freq: Once | INTRAVENOUS | Status: AC
  Administered 2019-10-04: 500 [IU] via INTRAVENOUS

## 2019-10-04 MED ORDER — HEPARIN SOD (PORK) LOCK FLUSH 100 UNIT/ML IV SOLN
INTRAVENOUS | Status: AC
  Filled 2019-10-04: qty 5

## 2019-10-05 ENCOUNTER — Inpatient Hospital Stay: Payer: Medicare Other | Attending: Internal Medicine | Admitting: Internal Medicine

## 2019-10-05 ENCOUNTER — Ambulatory Visit: Payer: Medicare Other

## 2019-10-05 ENCOUNTER — Other Ambulatory Visit: Payer: Self-pay

## 2019-10-05 ENCOUNTER — Inpatient Hospital Stay: Payer: Medicare Other

## 2019-10-05 ENCOUNTER — Encounter: Payer: Self-pay | Admitting: Internal Medicine

## 2019-10-05 DIAGNOSIS — Z452 Encounter for adjustment and management of vascular access device: Secondary | ICD-10-CM | POA: Diagnosis not present

## 2019-10-05 DIAGNOSIS — Z923 Personal history of irradiation: Secondary | ICD-10-CM | POA: Insufficient documentation

## 2019-10-05 DIAGNOSIS — Z7952 Long term (current) use of systemic steroids: Secondary | ICD-10-CM | POA: Insufficient documentation

## 2019-10-05 DIAGNOSIS — Z95828 Presence of other vascular implants and grafts: Secondary | ICD-10-CM

## 2019-10-05 DIAGNOSIS — Z79899 Other long term (current) drug therapy: Secondary | ICD-10-CM | POA: Insufficient documentation

## 2019-10-05 DIAGNOSIS — Z8701 Personal history of pneumonia (recurrent): Secondary | ICD-10-CM | POA: Diagnosis not present

## 2019-10-05 DIAGNOSIS — Z7901 Long term (current) use of anticoagulants: Secondary | ICD-10-CM | POA: Diagnosis not present

## 2019-10-05 DIAGNOSIS — C3490 Malignant neoplasm of unspecified part of unspecified bronchus or lung: Secondary | ICD-10-CM

## 2019-10-05 DIAGNOSIS — Z9221 Personal history of antineoplastic chemotherapy: Secondary | ICD-10-CM | POA: Insufficient documentation

## 2019-10-05 DIAGNOSIS — C3431 Malignant neoplasm of lower lobe, right bronchus or lung: Secondary | ICD-10-CM | POA: Diagnosis not present

## 2019-10-05 DIAGNOSIS — C3491 Malignant neoplasm of unspecified part of right bronchus or lung: Secondary | ICD-10-CM

## 2019-10-05 LAB — CMP (CANCER CENTER ONLY)
ALT: 52 U/L — ABNORMAL HIGH (ref 0–44)
AST: 37 U/L (ref 15–41)
Albumin: 3.4 g/dL — ABNORMAL LOW (ref 3.5–5.0)
Alkaline Phosphatase: 73 U/L (ref 38–126)
Anion gap: 12 (ref 5–15)
BUN: 17 mg/dL (ref 8–23)
CO2: 21 mmol/L — ABNORMAL LOW (ref 22–32)
Calcium: 8.3 mg/dL — ABNORMAL LOW (ref 8.9–10.3)
Chloride: 103 mmol/L (ref 98–111)
Creatinine: 0.96 mg/dL (ref 0.61–1.24)
GFR, Est AFR Am: >60 mL/min (ref >60)
GFR, Estimated: >60 mL/min (ref >60)
Glucose, Bld: 153 mg/dL — ABNORMAL HIGH (ref 70–99)
Potassium: 4.5 mmol/L (ref 3.5–5.1)
Sodium: 136 mmol/L (ref 135–145)
Total Bilirubin: 0.5 mg/dL (ref 0.3–1.2)
Total Protein: 6.2 g/dL — ABNORMAL LOW (ref 6.5–8.1)

## 2019-10-05 LAB — CBC WITH DIFFERENTIAL (CANCER CENTER ONLY)
Abs Immature Granulocytes: 0.51 10*3/uL — ABNORMAL HIGH (ref 0.00–0.07)
Basophils Absolute: 0.1 10*3/uL (ref 0.0–0.1)
Basophils Relative: 0 %
Eosinophils Absolute: 0 10*3/uL (ref 0.0–0.5)
Eosinophils Relative: 0 %
HCT: 42.6 % (ref 39.0–52.0)
Hemoglobin: 13.8 g/dL (ref 13.0–17.0)
Immature Granulocytes: 4 %
Lymphocytes Relative: 6 %
Lymphs Abs: 0.9 10*3/uL (ref 0.7–4.0)
MCH: 31.2 pg (ref 26.0–34.0)
MCHC: 32.4 g/dL (ref 30.0–36.0)
MCV: 96.4 fL (ref 80.0–100.0)
Monocytes Absolute: 0.5 10*3/uL (ref 0.1–1.0)
Monocytes Relative: 4 %
Neutro Abs: 12.1 10*3/uL — ABNORMAL HIGH (ref 1.7–7.7)
Neutrophils Relative %: 86 %
Platelet Count: 132 10*3/uL — ABNORMAL LOW (ref 150–400)
RBC: 4.42 MIL/uL (ref 4.22–5.81)
RDW: 15.2 % (ref 11.5–15.5)
WBC Count: 14.1 10*3/uL — ABNORMAL HIGH (ref 4.0–10.5)
nRBC: 0 % (ref 0.0–0.2)

## 2019-10-05 LAB — TSH: TSH: 2.077 u[IU]/mL (ref 0.320–4.118)

## 2019-10-05 MED ORDER — HEPARIN SOD (PORK) LOCK FLUSH 100 UNIT/ML IV SOLN
500.0000 [IU] | Freq: Once | INTRAVENOUS | Status: AC
  Administered 2019-10-05: 500 [IU] via INTRAVENOUS
  Filled 2019-10-05: qty 5

## 2019-10-05 MED ORDER — SODIUM CHLORIDE 0.9% FLUSH
10.0000 mL | INTRAVENOUS | Status: DC | PRN
  Administered 2019-10-05: 10 mL via INTRAVENOUS
  Filled 2019-10-05: qty 10

## 2019-10-05 NOTE — Progress Notes (Signed)
[facility] [phone]   Fax:([phone]  OFFICE PROGRESS NOTE  [name], MD [place] [address]  DIAGNOSIS: Stage IIIb (T3, N2, M0) non-small cell lung cancer, squamous cell carcinoma presented with right lower lobe lung mass in addition to subcarinal lymphadenopathy diagnosed in October 2020.  PRIOR THERAPY:  1) Weekly concurrent chemoradiation with Carboplatin for an AUC of 2 Paclitaxel 45 mg/m2. First dose 01/16/2019. Status post5cycles.  Last dose was given February 13, 2019. 2) Consolidation treatment with immunotherapy with Imfinzi 1500 mg IV every 4 weeks.  First dose April 05, 2019.  Status post 5 cycles.  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  CURRENT THERAPY:  Observation.  INTERVAL HISTORY: Martin Bailey 79 y.o. male returns to the clinic today for follow-up visit.  The patient is feeling much better today with no concerning complaints.  He has improvement in the cough as well as the shortness of breath after starting the tapered dose of prednisone.  He has two more weeks to finish this course.  He also has better appetite and energy.  He gained few pounds since last visit.  The patient denied having any nausea, vomiting, diarrhea or constipation.  He denied having any headache or visual changes.  He is here today for evaluation after repeating CT scan of the chest for restaging of his disease.  MEDICAL HISTORY: Past Medical History:  Diagnosis Date  . Cataract    left  . COLONIC POLYPS, HX OF 04/05/2007   ONE ADENOMATOUS POLYP AND ONE HYPERPLASTIC POLYP  . DEPRESSION 03/04/2007  . DERMATITIS, SEBORRHEIC 07/19/2007  . FIBRILLATION, ATRIAL 03/04/2007  . Hearing aid worn    B/L  . HYPERLIPIDEMIA 03/04/2007  . HYPERTENSION 03/04/2007  . Kidney stones   . Lung cancer ([facility]) dx'd 02/2019   non small cell lung ca  . Lung mass    mediastinal adenopathy     ALLERGIES:  is allergic to hydrocodone.  MEDICATIONS:  Current Outpatient Medications  Medication Sig Dispense Refill  . amLODipine (NORVASC) 5 MG tablet TAKE 1 TABLET BY MOUTH  DAILY 90 tablet 3  . atenolol (TENORMIN) 50 MG tablet Take 1 tablet (50 mg total) by mouth 2 (two) times daily. 180 tablet 3  . clobetasol (TEMOVATE) 0.05 % external solution Apply 1 application topically 2 (two) times daily. For seborrheic dermatitis- 7 days maximum 50 mL 0  . HYDROcodone-homatropine (HYCODAN) 5-1.5 MG/5ML syrup Take 5 mLs by mouth every 6 (six) hours as needed for cough. 240 mL 0  . lidocaine-prilocaine (EMLA) cream Apply 1 application topically as needed. 30 g 0  . Multiple Vitamin (MULTIVITAMIN WITH MINERALS) TABS tablet Take 1 tablet by mouth daily.    . predniSONE (DELTASONE) 20 MG tablet 4 tablets p.o. daily for 2 weeks, then 3 tablet p.o. daily for 1 week then 2 tablet p.o. daily for 1 week then 1 tablet p.o. daily for 1 week then 0.5 tablet p.o. daily for 1 week. 102 tablet 0  . triamcinolone cream (KENALOG) 0.5 % Apply topically 2 (two) times daily as needed. 30 g 1  . XARELTO 20 MG TABS tablet TAKE 1 TABLET BY MOUTH  DAILY WITH SUPPER 90 tablet 3   No current facility-administered medications for this visit.    SURGICAL HISTORY:  Past Surgical History:  Procedure Laterality Date  . CATARACT EXTRACTION W/ INTRAOCULAR LENS IMPLANT     right  . COLONOSCOPY    .  IR IMAGING GUIDED PORT INSERTION  01/31/2019  . RADIOFREQUENCY ABLATION     x2 for fib, on 2nd attempt switched form a flutter ot a fib.   Marland Kitchen ROTATOR CUFF REPAIR Right   . TONSILLECTOMY    . VIDEO BRONCHOSCOPY WITH ENDOBRONCHIAL ULTRASOUND Left 12/30/2018   Procedure: VIDEO BRONCHOSCOPY WITH ENDOBRONCHIAL ULTRASOUND WITH FLUOROSCOPY;  Surgeon: Laurin Coder, MD;  Location: [facility];  Service: Thoracic;  Laterality: Left;  . WISDOM TOOTH EXTRACTION      REVIEW OF SYSTEMS:  Constitutional: negative Eyes: negative Ears,  nose, mouth, throat, and face: negative Respiratory: negative Cardiovascular: negative Gastrointestinal: negative Genitourinary:negative Integument/breast: negative Hematologic/lymphatic: negative Musculoskeletal:negative Neurological: negative Behavioral/Psych: negative Endocrine: negative Allergic/Immunologic: negative   PHYSICAL EXAMINATION: General appearance: alert, cooperative and no distress Head: Normocephalic, without obvious abnormality, atraumatic Neck: no adenopathy, no JVD, supple, symmetrical, trachea midline and thyroid not enlarged, symmetric, no tenderness/mass/nodules Lymph nodes: Cervical, supraclavicular, and axillary nodes normal. Resp: clear to auscultation bilaterally Back: symmetric, no curvature. ROM normal. No CVA tenderness. Cardio: regular rate and rhythm, S1, S2 normal, no murmur, click, rub or gallop GI: soft, non-tender; bowel sounds normal; no masses,  no organomegaly Extremities: extremities normal, atraumatic, no cyanosis or edema Neurologic: Alert and oriented X 3, normal strength and tone. Normal symmetric reflexes. Normal coordination and gait  ECOG PERFORMANCE STATUS: 1 - Symptomatic but completely ambulatory  Blood pressure 122/80, pulse 78, temperature 97.7 F (36.5 C), temperature source Temporal, resp. rate 18, height 5\' 10"  (1.778 m), weight 193 lb 9.6 oz (87.8 kg), SpO2 100 %.  LABORATORY DATA: Lab Results  Component Value Date   WBC 14.1 (H) 10/05/2019   HGB 13.8 10/05/2019   HCT 42.6 10/05/2019   MCV 96.4 10/05/2019   PLT 132 (L) 10/05/2019      Chemistry      Component Value Date/Time   NA 135 09/07/2019 0845   K 4.1 09/07/2019 0845   CL 101 09/07/2019 0845   CO2 24 09/07/2019 0845   BUN 12 09/07/2019 0845   CREATININE 1.05 09/07/2019 0845      Component Value Date/Time   CALCIUM 8.9 09/07/2019 0845   ALKPHOS 114 09/07/2019 0845   AST 19 09/07/2019 0845   ALT 21 09/07/2019 0845   BILITOT 1.0 09/07/2019 0845        RADIOGRAPHIC STUDIES: CT Chest W Contrast  Result Date: 10/04/2019 CLINICAL DATA:  Primary Cancer Type: Lung Imaging Indication: Routine surveillance Interval therapy since last imaging? No Initial Cancer Diagnosis Date: 01/07/2019; Established by: Biopsy-proven Detailed Pathology: Stage IIIb non-small cell lung cancer, squamous cell carcinoma. Primary Tumor location: Right lower lobe. Chemotherapy: Yes; Ongoing? No; Most recent administration: 02/13/2019 Immunotherapy?  Yes; Type: Imfinzi; Ongoing? No Radiation therapy? Yes; Date Range: 01/01/2019-03/03/2019; Target: Right lower lobe. EXAM: CT CHEST WITH CONTRAST TECHNIQUE: Multidetector CT imaging of the chest was performed during intravenous contrast administration. CONTRAST:  59mL OMNIPAQUE IOHEXOL 300 MG/ML  SOLN COMPARISON:  Most recent CT chest 08/02/2019.  12/02/2018 PET-CT. FINDINGS: Cardiovascular: Normal heart size. Stable trace pericardial effusion/thickening. Right internal jugular Port-A-Cath terminates at the cavoatrial junction. Left anterior descending and left circumflex coronary atherosclerosis. Atherosclerotic nonaneurysmal thoracic aorta. Normal caliber pulmonary arteries. No central pulmonary emboli. Mediastinum/Nodes: No discrete thyroid nodules. Unremarkable esophagus. No axillary adenopathy. Subcarinal 0.9 cm lymph node (series 2/image 71), slightly decreased from 1.0 cm. No pathologically enlarged mediastinal or hilar nodes on today's scan. Lungs/Pleura: No pneumothorax. Small dependent right pleural effusion is minimally increased. No left pleural effusion.  Spiculated central right lower lobe 2.3 x 2.0 cm nodule (series 7/image 84), decreased from 4.3 x 3.4 cm. Sharply marginated surrounding patchy right lower lobe and right perihilar consolidation and ground-glass opacity with increasing volume loss and distortion, compatible with evolving postradiation change. Patchy ground-glass opacity in the posterior right upper lobe is new  and also presumably radiation related given sharp margin anteriorly. No new significant pulmonary nodules. Upper abdomen: No acute abnormality. Musculoskeletal: No aggressive appearing focal osseous lesions. Moderate thoracic spondylosis. IMPRESSION: 1. Continued reduction in the size of the treated spiculated right lower lobe pulmonary nodule. Expected evolution of surrounding postradiation changes in the right lung. 2. Small dependent right pleural effusion, minimally increased. 3. Previously described mild subcarinal lymphadenopathy is slightly decreased. 4. No new or progressive metastatic disease in the chest. 5.  Aortic Atherosclerosis (ICD10-I70.0). Electronically Signed   By: Ilona Sorrel M.D.   On: 10/04/2019 11:22    ASSESSMENT AND PLAN: This is a very pleasant 79 years old white male with a stage IIIb non-small cell lung cancer, squamous cell carcinoma diagnosed in October 2020 status post course of concurrent chemoradiation with 5 cycles of chemotherapy with carboplatin and paclitaxel. The patient tolerated his treatment well except for pancytopenia. The patient had partial response after his treatment. He is currently undergoing consolidation treatment with immunotherapy with Imfinzi 1500 mg IV every 4 weeks status post 5  cycles.   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 His treatment was discontinued because of the persistent and recurrent immunotherapy mediated pneumonitis. The patient started on a tapered dose of prednisone and over the last few weeks he felt much better with more energy and appetite as well as less cough and shortness of breath. He had repeat CT scan of the chest performed recently.  I personally and independently reviewed the scans and discussed the results with the patient today. His scan showed further improvement of his disease with no concerning findings of progression. I  recommended for the patient to continue on observation with repeat CT scan of the chest in 3 months. He will finish the tapered dose of prednisone as planned. He was advised to call immediately if he has any concerning symptoms in the interval. The patient voices understanding of current disease status and treatment options and is in agreement with the current care plan.  All questions were answered. The patient knows to call the clinic with any problems, questions or concerns. We can certainly see the patient much sooner if necessary.  Disclaimer: This note was dictated with voice recognition software. Similar sounding words can inadvertently be transcribed and may not be corrected upon review.

## 2019-10-11 NOTE — Telephone Encounter (Signed)
Dr Loletha Carrow we had this as reminder to re-evaluate his chart.Please advise

## 2019-10-16 NOTE — Telephone Encounter (Signed)
Most recent oncology note indicates cancer stable and patient improving after some treatment-related lung issues.  Best for him to see me in clinic before scheduling colonoscopy.  Next available - Aug or Sept is OK.  - HD

## 2019-10-17 NOTE — Telephone Encounter (Signed)
Left message to return call and schedule a follow up.

## 2019-10-20 NOTE — Telephone Encounter (Addendum)
Left a message to call and schedule a follow up.

## 2019-10-20 NOTE — Telephone Encounter (Signed)
Pt states he would like to schedule a follow up with [facility] hospital instead since he states he is due to see them around November

## 2019-10-20 NOTE — Telephone Encounter (Signed)
Totally fine with me. He should contact Dr. Ivor Messier at the [facility] GI department directly.  - HD

## 2019-11-08 ENCOUNTER — Telehealth: Payer: Self-pay | Admitting: Family Medicine

## 2019-11-08 NOTE — Progress Notes (Signed)
  Chronic Care Management   Note  11/08/2019 Name: Tadhg Eskew MRN: [id number] DOB: November 27, 1940  Jamelle Rushing Mcdaniel is a 79 y.o. year old male who is a primary care patient of [name], MD. I reached out to [name] by phone today in response to a referral sent by Mr. Ashir Kunz Reim's PCP, Yong Channel, [name], MD.   Mr. Moncus was given information about Chronic Care Management services today including:  1. CCM service includes personalized support from designated clinical staff supervised by his physician, including individualized plan of care and coordination with other care providers 2. 24/7 contact phone numbers for assistance for urgent and routine care needs. 3. Service will only be billed when office clinical staff spend 20 minutes or more in a month to coordinate care. 4. Only one practitioner may furnish and bill the service in a calendar month. 5. The patient may stop CCM services at any time (effective at the end of the month) by phone call to the office staff.   Patient agreed to services and verbal consent obtained.   Follow up plan:   Earney [facility] Scheduler

## 2019-12-07 ENCOUNTER — Ambulatory Visit (INDEPENDENT_AMBULATORY_CARE_PROVIDER_SITE_OTHER): Payer: Medicare Other | Admitting: Family Medicine

## 2019-12-07 ENCOUNTER — Other Ambulatory Visit: Payer: Self-pay

## 2019-12-07 ENCOUNTER — Other Ambulatory Visit: Payer: Self-pay | Admitting: Family Medicine

## 2019-12-07 ENCOUNTER — Ambulatory Visit (HOSPITAL_COMMUNITY)
Admission: RE | Admit: 2019-12-07 | Discharge: 2019-12-07 | Disposition: A | Discharge status: Home / Self Care | Payer: Medicare Other | Source: Ambulatory Visit | Attending: Family Medicine | Admitting: Family Medicine

## 2019-12-07 ENCOUNTER — Encounter: Payer: Self-pay | Admitting: Family Medicine

## 2019-12-07 VITALS — BP 102/64 | HR 93 | Temp 97.8°F | Ht 70.0 in | Wt 197.0 lb

## 2019-12-07 DIAGNOSIS — R5383 Other fatigue: Secondary | ICD-10-CM | POA: Diagnosis not present

## 2019-12-07 DIAGNOSIS — I10 Essential (primary) hypertension: Secondary | ICD-10-CM | POA: Diagnosis not present

## 2019-12-07 DIAGNOSIS — R0602 Shortness of breath: Secondary | ICD-10-CM | POA: Insufficient documentation

## 2019-12-07 DIAGNOSIS — C3491 Malignant neoplasm of unspecified part of right bronchus or lung: Secondary | ICD-10-CM

## 2019-12-07 DIAGNOSIS — Z1159 Encounter for screening for other viral diseases: Secondary | ICD-10-CM

## 2019-12-07 MED ORDER — CLOBETASOL PROPIONATE 0.05 % EX SOLN
1.0000 | Freq: Two times a day (BID) | CUTANEOUS | 0 refills | Status: DC
Start: 2019-12-07 — End: 2023-06-10

## 2019-12-07 MED ORDER — AMOXICILLIN-POT CLAVULANATE 875-125 MG PO TABS: 1.0000 | ORAL_TABLET | Freq: Two times a day (BID) | ORAL | 0 refills | Status: AC

## 2019-12-07 MED ORDER — HEPARIN SOD (PORK) LOCK FLUSH 100 UNIT/ML IV SOLN
500.0000 [IU] | INTRAVENOUS | Status: AC | PRN
  Administered 2019-12-07: 500 [IU]

## 2019-12-07 MED ORDER — AZITHROMYCIN 250 MG PO TABS: ORAL_TABLET | ORAL | 0 refills | Status: DC

## 2019-12-07 MED ORDER — SODIUM CHLORIDE 0.9% FLUSH
10.0000 mL | INTRAVENOUS | Status: AC | PRN
  Administered 2019-12-07: 10 mL

## 2019-12-07 NOTE — Patient Instructions (Addendum)
Health Maintenance Due  Topic Date Due  . Hepatitis C Screening - today with labs Never done  . COLONOSCOPY declined for now  07/28/2018  . INFLUENZA VACCINE - hold off for now.  10/29/2019   Cut out amlodipine completely since you have been taking only a half  Need chest x-ray--> Pt can go over to [facility] in Main Entrance to get x-ray. Doors close at 5 for walk in patients.   Need to get covid tested to be sure [email or web address]  Martin Bailey will draw blood and then walk you out side door  If you have mychart- we will send your results within 3 business days of [place] receiving them.  If you do not have mychart- we will call you about results within 5 business days of [place] receiving them.  *please note we are currently using [facility] which has a longer processing time than [facility] typically so labs may not come back as quickly as in the past *please also note that you will see labs on mychart as soon as they post. I will later go in and write notes on them- will say "notes from [name]"  If you have new or worsening symptoms please let us know

## 2019-12-07 NOTE — Progress Notes (Signed)
Phone [phone] In person visit   Subjective:   Martin Bailey is a 79 y.o. year old very pleasant male patient who presents for/with See problem oriented charting Chief Complaint  Patient presents with  . Anorexia    x 2 weeks.    This visit occurred during the SARS-CoV-2 public health emergency.  Safety protocols were in place, including screening questions prior to the visit, additional usage of staff PPE, and extensive cleaning of exam room while observing appropriate contact time as indicated for disinfecting solutions.   Past Medical History-  Patient Active Problem List   Diagnosis Date Noted  . Pneumonitis, interstitial ([place]) 06/29/2019    Priority: High  . Stage III squamous cell carcinoma of right lung ([place]) 01/06/2019    Priority: High  . Alcoholism ([place]) 06/21/2014    Priority: High  . Atrial fibrillation ([place]) 03/04/2007    Priority: High  . Elevated TSH 09/21/2014    Priority: Medium  . Seborrheic dermatitis 07/19/2007    Priority: Medium  . Hyperlipidemia 03/04/2007    Priority: Medium  . Major depression ([place]) 03/04/2007    Priority: Medium  . Essential hypertension 03/04/2007    Priority: Medium  . Former smoker 06/21/2014    Priority: Low  . Allergic rhinitis 04/06/2014    Priority: Low  . GERD (gastroesophageal reflux disease) 04/06/2014    Priority: Low  . Hx of adenomatous colonic polyps 03/05/2014    Priority: Low  . Chronic anticoagulation 03/05/2014    Priority: Low  . Testosterone deficiency 09/02/2010    Priority: Low  . Drug-induced pneumonitis 09/19/2019  . Encounter for antineoplastic immunotherapy 03/29/2019  . Chemotherapy-induced thrombocytopenia 02/20/2019  . Encounter for antineoplastic chemotherapy 01/06/2019  . Goals of care, counseling/discussion 01/06/2019  . Laryngopharyngeal reflux (LPR) 09/12/2018  . Cough 09/12/2018  . Disorientation 02/10/2018  . Overweight 08/09/2017    Medications- reviewed and  updated Current Outpatient Medications  Medication Sig Dispense Refill  . atenolol (TENORMIN) 50 MG tablet Take 1 tablet (50 mg total) by mouth 2 (two) times daily. 180 tablet 3  . clobetasol (TEMOVATE) 0.05 % external solution Apply 1 application topically 2 (two) times daily. For seborrheic dermatitis- 7 days maximum 50 mL 0  . Multiple Vitamin (MULTIVITAMIN WITH MINERALS) TABS tablet Take 1 tablet by mouth daily.    Marland Kitchen triamcinolone cream (KENALOG) 0.5 % Apply topically 2 (two) times daily as needed. 30 g 1  . XARELTO 20 MG TABS tablet TAKE 1 TABLET BY MOUTH  DAILY WITH SUPPER 90 tablet 3  . amLODipine (NORVASC) 5 MG tablet TAKE 1 TABLET BY MOUTH  DAILY (Patient not taking: Reported on 12/07/2019) 90 tablet 3   No current facility-administered medications for this visit.     Objective:  BP 102/64   Pulse 93   Temp 97.8 F (36.6 C) (Temporal)   Ht 5\' 10"  (1.778 m)   Wt 197 lb (89.4 kg)   SpO2 96%   BMI 28.27 kg/m  Gen: NAD, resting comfortably CV: RRR no murmurs rubs or gallops Lungs: CTAB no crackles, wheeze, rhonchi Abdomen: soft/nontender/nondistended/normal bowel sounds.  Ext: trace edema Skin: warm, dry  EKG: atrial fibrillation with rate 84, left axis, normal intervals, no hypertrophy, no st or t wave changes (leads v1 and v2 qrs complex appear stable with prior EKGs (small r wave noted)      Assessment and Plan    # Loss of appetite/fatigue/shortness of breath S:started about two weeks ago very suddenly. Has had  increased fatigue with it as well.  Weight appears stable-actually up 4 pounds from July visit. He states he literally doesn't want to eat even if something looks good and smells good. He has not lost sense of taste or smell. Denies feeling of nausea.   Had been to gym 3 days a week for 45 minutes but not sure he can do that anymore. He is not sure if he is short of breath but does breath more heavily at times. Walking cross the room makes him feel similar to  when he was doing cardio.  Does not feel like he can get a good dep breath.   He has not been covid tested.   I reviewed labs drawn July 2021 and patient did have elevated white count as well as thrombocytopenia though mild for each.  TSH was normal at that time  No fever, chills, cough, congestion, runny nose, body aches, sore throat, headache, nausea, vomiting, diarrhea, or new loss of taste or smell. No known contacts with covid 19 or someone being tested for covid 19.   No swelling in the legs. No muscle aches or weakness. No chest pain  Feels fatigue in any part of the body doing the activity - such as legs if walking to another room.   Blood pressure running lower at home 100s or 110s.  Already on 2.5 mg amlodipine  Ongoing cough- better than it was at its worst. Still 3-4 fits per day. Reflux medicine never helped. Allergy medicine never helped. Prednisone rounds not particularly helpful  Immunotherapy worsened cough. But didn't resolve off immunotherapy  3 months ago painful walking on his feet - almost as of did a lot of hard running on hard surface and feet were sore. Stayed that way for several days and then eventually got better and then resolved. Has not had recurrence.   A/P: 79 year old male with history of lung cancer improved on most recent scan now with 2-week onset of decreased appetite, fatigue, shortness of breath -COVID-19 testing recommended and information given -Patient will get chest x-[facility] at [facility] -Attempted blood draw but unable to draw-once a lung cancer center will be able to assist using his port-blood work ordered today -Patient also with long-term chronic cough may originally have been related to lung cancer and later related to immunotherapy-discussed possible pulmonology referral when recovered from acute concerns -No history of diabetes and no urinary symptoms-doubt diabetes as cause -On Xarelto for atrial fibrillation-doubt development of pulmonary  embolism -EKG largely stable compared to baseline -Does not appear overtly fluid overloaded but will check BNP and chest x-ray-consider echocardiogram -Initial complaint was anorexia only-when discovered shortness of breath and fatigue team appropriately used N95 mask-patient was escorted out the side door after visit -Patient primarily minimizes shortness of breath complaint and has no chest pain.  Depending on work-up above would also consider cardiology evaluation  I was honest I do not have a clear explanation for the burning in his feet sensation he had for several days but I am thankful this resolved and has not recurred  #hypertension S: medication: Atenolol 50 mg twice daily, amlodipine 2.5 mg-had started cutting 5 mg in half after recent visit Home readings #s: Reports blood pressures in the 100s 2 110s BP Readings from Last 3 Encounters:  12/07/19 102/64  10/05/19 122/80  09/19/19 111/67  A/P: Blood pressure is running low I wonder if this contributes to the sensation of fatigue-we will stop amlodipine at this time   #  stage III squamous cell carcinoma of the lungs S:  diagnosed October 2020-status post course of concurrent chemoradiation with 5 cycles of chemotherapy.  Did develop pancytopenia.  - with chemoradiation- Patient had partial response with initial treatment and underwent consolidation treatment with immunotherapy Imfinzi - -possible immunotherapy mediated pneumonitis led to this being stopped.  Follow-up CT scan on showed further improvement of disease with no findings concerning for progression-plan for 35-month follow-up A/P: Thankfully lung cancer appears to be improving.  We will investigate other causes of shortness of breath with work-up above.  I am getting a chest x-ray to evaluate infectious causes but I do not think we need to start with a CT scan  Recommended follow up: As needed for acute concerns Future Appointments  Date Time Provider [place]   12/07/2019  1:15 PM [facility] 5 [facility] [place]  01/05/2020 10:30 AM [facility] LAB [facility] None  01/08/2020 11:45 AM Curt Bears, MD [facility] None  01/08/2020  2:00 PM [facility] CCM PHARMACIST [facility]  02/29/2020  2:40 PM [name], MD [facility]    Lab/Order associations:   ICD-10-CM   1. Fatigue, unspecified type  R53.83 CBC With Differential/Platelet    COMPLETE METABOLIC PANEL WITH GFR    TSH    ABO AND RH     DG Chest 2 View    EKG 12-Lead  2. Shortness of breath  R06.02 CBC With Differential/Platelet    COMPLETE METABOLIC PANEL WITH GFR    ABO AND RH     B Nat Peptide    DG Chest 2 View    EKG 12-Lead  3. Encounter for hepatitis C screening test for low risk patient  Z11.59 HEP C AB W/REFL  4. Essential hypertension  I10   5. Stage III squamous cell carcinoma of right lung (HCC)  C34.91     Meds ordered this encounter  Medications  . clobetasol (TEMOVATE) 0.05 % external solution    Sig: Apply 1 application topically 2 (two) times daily. For seborrheic dermatitis- 7 days maximum    Dispense:  50 mL    Refill:  0   Time Spent: 44 minutes of total time (11:43 AM-12:20 PM, 12:55 PM-1:04 PM with 2 minutes of this used on ekg interpetation and not included in total time) was spent on the date of the encounter performing the following actions: chart review prior to seeing the patient, obtaining history, performing a medically necessary exam, counseling on the treatment plan, placing orders, and documenting in our EHR.   Return precautions advised.  Garret Reddish, MD

## 2019-12-11 DIAGNOSIS — Z20822 Contact with and (suspected) exposure to covid-19: Secondary | ICD-10-CM | POA: Diagnosis not present

## 2019-12-14 ENCOUNTER — Encounter: Payer: Self-pay | Admitting: Family Medicine

## 2019-12-14 ENCOUNTER — Other Ambulatory Visit: Payer: Self-pay

## 2019-12-14 ENCOUNTER — Ambulatory Visit (HOSPITAL_COMMUNITY)
Admission: RE | Admit: 2019-12-14 | Discharge: 2019-12-14 | Disposition: A | Discharge status: Home / Self Care | Payer: Medicare Other | Source: Ambulatory Visit | Attending: Family Medicine | Admitting: Family Medicine

## 2019-12-14 ENCOUNTER — Telehealth (INDEPENDENT_AMBULATORY_CARE_PROVIDER_SITE_OTHER): Payer: Medicare Other | Admitting: Family Medicine

## 2019-12-14 VITALS — BP 114/78

## 2019-12-14 DIAGNOSIS — R0602 Shortness of breath: Secondary | ICD-10-CM

## 2019-12-14 DIAGNOSIS — R918 Other nonspecific abnormal finding of lung field: Secondary | ICD-10-CM | POA: Diagnosis not present

## 2019-12-14 DIAGNOSIS — J90 Pleural effusion, not elsewhere classified: Secondary | ICD-10-CM | POA: Diagnosis not present

## 2019-12-14 DIAGNOSIS — I313 Pericardial effusion (noninflammatory): Secondary | ICD-10-CM | POA: Diagnosis not present

## 2019-12-14 DIAGNOSIS — R079 Chest pain, unspecified: Secondary | ICD-10-CM

## 2019-12-14 DIAGNOSIS — R5383 Other fatigue: Secondary | ICD-10-CM

## 2019-12-14 DIAGNOSIS — R05 Cough: Secondary | ICD-10-CM

## 2019-12-14 DIAGNOSIS — J479 Bronchiectasis, uncomplicated: Secondary | ICD-10-CM | POA: Diagnosis not present

## 2019-12-14 DIAGNOSIS — C3491 Malignant neoplasm of unspecified part of right bronchus or lung: Secondary | ICD-10-CM

## 2019-12-14 DIAGNOSIS — R059 Cough, unspecified: Secondary | ICD-10-CM

## 2019-12-14 DIAGNOSIS — I2510 Atherosclerotic heart disease of native coronary artery without angina pectoris: Secondary | ICD-10-CM | POA: Diagnosis not present

## 2019-12-14 LAB — POCT I-STAT CREATININE: Creatinine, Ser: 0.7 mg/dL (ref 0.61–1.24)

## 2019-12-14 MED ORDER — IOHEXOL 350 MG/ML SOLN
75.0000 mL | Freq: Once | INTRAVENOUS | Status: AC | PRN
  Administered 2019-12-14: 60 mL via INTRAVENOUS

## 2019-12-14 MED ORDER — HEPARIN SOD (PORK) LOCK FLUSH 100 UNIT/ML IV SOLN
500.0000 [IU] | INTRAVENOUS | Status: AC | PRN
  Administered 2019-12-14: 500 [IU]
  Filled 2019-12-14: qty 5

## 2019-12-14 NOTE — Addendum Note (Signed)
Addended by: [name] on: 12/14/2019 08:58 PM   Modules accepted: Orders

## 2019-12-14 NOTE — Telephone Encounter (Signed)
[name] ordered an echo for pt. CPT codes 93306 & [phone] did not require a prior auth. Waiting for further review from [facility] Medicare for CPT [phone]. Authorization # [id number]. Can fax medical records to [phone].

## 2019-12-14 NOTE — Patient Instructions (Addendum)
Health Maintenance Due  Topic Date Due  . Hepatitis C Screening  Never done  . COLONOSCOPY  07/28/2018  . INFLUENZA VACCINE  10/29/2019      Recommended follow up: No follow-ups on file.

## 2019-12-14 NOTE — Addendum Note (Signed)
Addended by: [name] on: 12/14/2019 08:54 PM   Modules accepted: Orders

## 2019-12-14 NOTE — Progress Notes (Signed)
Phone [phone] Virtual visit via Video note   Subjective:  Chief complaint: Chief Complaint  Patient presents with  . Extreme Weakness   This visit type was conducted due to national recommendations for restrictions regarding the COVID-19 Pandemic (e.g. social distancing).  This format is felt to be most appropriate for this patient at this time balancing risks to patient and risks to population by having him in for in person visit.  No physical exam was performed (except for noted visual exam or audio findings with Telehealth visits).    Our team/I connected with Martin Bailey at 11:40 AM EDT by a video enabled telemedicine application (doxy.me or caregility through epic) and verified that I am speaking with the correct person using two identifiers.  Location patient: [place] Location provider: [facility], office Persons participating in the virtual visit:  patient  Our team/I discussed the limitations of evaluation and management by telemedicine and the availability of in person appointments. In light of current covid-19 pandemic, patient also understands that we are trying to protect them by minimizing in office contact if at all possible.  The patient expressed consent for telemedicine visit and agreed to proceed. Patient understands insurance will be billed.   Past Medical History-  Patient Active Problem List   Diagnosis Date Noted  . Pneumonitis, interstitial ([place]) 06/29/2019    Priority: High  . Stage III squamous cell carcinoma of right lung ([place]) 01/06/2019    Priority: High  . Alcoholism ([place]) 06/21/2014    Priority: High  . Atrial fibrillation ([place]) 03/04/2007    Priority: High  . Elevated TSH 09/21/2014    Priority: Medium  . Seborrheic dermatitis 07/19/2007    Priority: Medium  . Hyperlipidemia 03/04/2007    Priority: Medium  . Major depression ([place]) 03/04/2007    Priority: Medium  . Essential hypertension 03/04/2007    Priority: Medium  . Former  smoker 06/21/2014    Priority: Low  . Allergic rhinitis 04/06/2014    Priority: Low  . GERD (gastroesophageal reflux disease) 04/06/2014    Priority: Low  . Hx of adenomatous colonic polyps 03/05/2014    Priority: Low  . Chronic anticoagulation 03/05/2014    Priority: Low  . Testosterone deficiency 09/02/2010    Priority: Low  . Drug-induced pneumonitis 09/19/2019  . Encounter for antineoplastic immunotherapy 03/29/2019  . Chemotherapy-induced thrombocytopenia 02/20/2019  . Encounter for antineoplastic chemotherapy 01/06/2019  . Goals of care, counseling/discussion 01/06/2019  . Laryngopharyngeal reflux (LPR) 09/12/2018  . Cough 09/12/2018  . Disorientation 02/10/2018  . Overweight 08/09/2017    Medications- reviewed and updated Current Outpatient Medications  Medication Sig Dispense Refill  . amoxicillin-clavulanate (AUGMENTIN) 875-125 MG tablet Take 1 tablet by mouth 2 (two) times daily for 7 days. 14 tablet 0  . atenolol (TENORMIN) 50 MG tablet Take 1 tablet (50 mg total) by mouth 2 (two) times daily. 180 tablet 3  . azithromycin (ZITHROMAX) 250 MG tablet Take 2 tabs on day 1, then 1 tab daily until finished 6 tablet 0  . clobetasol (TEMOVATE) 0.05 % external solution Apply 1 application topically 2 (two) times daily. For seborrheic dermatitis- 7 days maximum 50 mL 0  . Multiple Vitamin (MULTIVITAMIN WITH MINERALS) TABS tablet Take 1 tablet by mouth daily.    Marland Kitchen triamcinolone cream (KENALOG) 0.5 % Apply topically 2 (two) times daily as needed. 30 g 1  . XARELTO 20 MG TABS tablet TAKE 1 TABLET BY MOUTH  DAILY WITH SUPPER 90 tablet 3  . amLODipine (  NORVASC) 5 MG tablet TAKE 1 TABLET BY MOUTH  DAILY (Patient not taking: Reported on 12/07/2019) 90 tablet 3   No current facility-administered medications for this visit.     Objective:  BP 114/78  self reported vitals Gen: NAD, resting comfortably while at rest. SOB mainly with activity Lungs: nonlabored, normal respiratory rate    Skin: appears dry, no obvious rash     Assessment and Plan  #Extreme Weakness.  Ongoing cough and shortness of breath as well as appetite suppression S: Patient stated that he is unable to do day to day task without getting exhausted. He stated that it takes him a couple seconds to recover, but as soon as he starts walking again the feeling of weakness comes back.   From mychart message:  "I have finished with the antibiotics, and I can't see any improvement.  I still can barely get from room to room, appetite is poor, and cough is no better.  If I walk from my recliner to the kitchen, less than 30 feet, I arrive exhausted and panting.  I got a COVID test on Monday, and it came back negative.  I do have one bit of information that I didn't have at your office.  You asked when this started and I wasn't sure.  I'm sure now.  I attended a luncheon on Wednesday, 9/1, with some high school classmates, and at that time I felt fine and the appetite was good.  The next day I felt the way I do now, and thought, "There's no way I can do my gym workout tomorrow."  So this problem occurred sometime in that 24-hour period.  I hope that helps."  On call states no improvement. No calf swelling or pain. Still very low appetite. No worsenign of symptoms just stable. No chest pain  Did have pneumonitis earlier this year- did not feel same level of fatigue. Previously did have similar reaction (fatigue, low appetite on hydrocodone)  A/P: Patient with weakness, shortness of breath, cough, appetite suppression who appeared to have pneumonia on chest x-ray-has been treated with Augmentin and azithromycin without improvement -Patient reports having blood drawn at [facility] but we do not have any results yet-team will reach out to [facility] and see if we can have this processed or if we need to order labs again-our team was unable to draw his blood.  He has a port they can access if needed -We will try to get CT  angiogram (likely need blood work first for creatinine) -Get echocardiogram and refer to cardiology -May need pulmonary referral -I am going to update Dr. Earlie Server his oncologist of this work-up  Recommended follow up: As needed for acute concerns-emergent precautions were also given Future Appointments  Date Time Provider [place]  01/05/2020 10:30 AM [facility] LAB [facility] None  01/08/2020 11:45 AM Curt Bears, MD [facility] None  01/08/2020  2:00 PM [facility] CCM PHARMACIST [facility]  02/29/2020  2:40 PM [name], MD [facility]    Lab/Order associations:   ICD-10-CM   1. Shortness of breath  R06.02 ECHOCARDIOGRAM COMPLETE    Ambulatory referral to Cardiology  2. Chest pain, unspecified type  R07.9 CT Angio Chest W/Cm &/Or Wo Cm  3. Opacities of both lungs present on chest x-ray  R91.8 CT Angio Chest W/Cm &/Or Wo Cm  4. Stage III squamous cell carcinoma of right lung (HCC)  C34.91 CT Angio Chest W/Cm &/Or Wo Cm    Time Spent: 32  minutes of total time (11:44 AM- 12:08 PM, 12:34- 12:42 PM) was spent on the date of the encounter performing the following actions: chart review prior to seeing the patient, obtaining history, performing a medically necessary exam, counseling on the treatment plan, placing orders, and documenting in our EHR.   Return precautions advised.  Garret Reddish, MD

## 2019-12-15 ENCOUNTER — Encounter: Payer: Self-pay | Admitting: Family Medicine

## 2019-12-15 ENCOUNTER — Encounter: Payer: Self-pay | Admitting: Pulmonary Disease

## 2019-12-15 ENCOUNTER — Ambulatory Visit (INDEPENDENT_AMBULATORY_CARE_PROVIDER_SITE_OTHER): Payer: Medicare Other | Admitting: Pulmonary Disease

## 2019-12-15 DIAGNOSIS — C3491 Malignant neoplasm of unspecified part of right bronchus or lung: Secondary | ICD-10-CM

## 2019-12-15 DIAGNOSIS — R918 Other nonspecific abnormal finding of lung field: Secondary | ICD-10-CM | POA: Insufficient documentation

## 2019-12-15 DIAGNOSIS — R0602 Shortness of breath: Secondary | ICD-10-CM | POA: Insufficient documentation

## 2019-12-15 MED ORDER — PREDNISONE 10 MG PO TABS: ORAL_TABLET | ORAL | 0 refills | Status: DC

## 2019-12-15 MED ORDER — LEVOFLOXACIN 500 MG PO TABS: 500.0000 mg | ORAL_TABLET | Freq: Every day | ORAL | 0 refills | Status: DC

## 2019-12-15 NOTE — Assessment & Plan Note (Signed)
Sudden acute worsening shortness of breath at the beginning of September/2021 Most recent CTA chest negative for PE No recent lab work on file History of pneumonitis Recent CTA chest shows significant groundglass  Plan: Lab work today Close follow-up with our office in 2 to 4 weeks We will send staff message to Dr. Earlie Server and Dr. Jenetta Downer Encourage patient to obtain lab work from primary care virtual visit yesterday Prednisone taper Levaquin today

## 2019-12-15 NOTE — H&P (View-Only) (Signed)
Virtual Visit via Telephone Note  I connected with Martin Bailey on 12/15/19 at 12:00 PM EDT by telephone and verified that I am speaking with the correct person using two identifiers.  Location: Patient: Home Provider: [facility] - [address], [place], [place], [place] [address]   I discussed the limitations, risks, security and privacy concerns of performing an evaluation and management service by telephone and the availability of in person appointments. I also discussed with the patient that there may be a patient responsible charge related to this service. The patient expressed understanding and agreed to proceed.  Patient consented to consult via telephone: Yes People present and their role in pt care: Pt     History of Present Illness:  79 year old male former smoker followed in our office for squamous cell carcinoma of the lung, dyspnea on exertion, cough  Past medical history: Hyperlipidemia, depression, hypertension, A. fib, chronic anticoagulation, GERD Former smoker.  Quit 1979.  15-pack-year smoking history Maintenance: Patient of Dr. Jenetta Downer  Chief complaint:    79 year old male former smoker followed by Dr. Jenetta Downer in our office.  He is status post bronchoscopy with Dr. Jenetta Downer in October/2020.  Patient has now been established with oncology.  Last seen by Dr. Earlie Server in July/10/2019.  Assessment and plan from that office visit was as follows:  ASSESSMENT AND PLAN: This is a very pleasant 79 years old white male with a stage IIIb non-small cell lung cancer, squamous cell carcinoma diagnosed in October 2020 status post course of concurrent chemoradiation with 5 cycles of chemotherapy with carboplatin and paclitaxel. The patient tolerated his treatment well except for pancytopenia. The patient had partial response after his treatment. He is currently undergoing consolidation treatment with immunotherapy with Imfinzi 1500 mg IV every 4 weeks status post 5  cycles.    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 His treatment was discontinued because of the persistent and recurrent immunotherapy mediated pneumonitis. The patient started on a tapered dose of prednisone and over the last few weeks he felt much better with more energy and appetite as well as less cough and shortness of breath. He had repeat CT scan of the chest performed recently.  I personally and independently reviewed the scans and discussed the results with the patient today. His scan showed further improvement of his disease with no concerning findings of progression. I recommended for the patient to continue on observation with repeat CT scan of the chest in 3 months. He will finish the tapered dose of prednisone as planned. He was advised to call immediately if he has any concerning symptoms in the interval. The patient voices understanding of current disease status and treatment options and is in agreement with the current care plan.  All questions were answered. The patient knows to call the clinic with any problems, questions or concerns. We can certainly see the patient much sooner if necessary.  Disclaimer: This note was dictated with voice recognition software. Similar sounding words can inadvertently be transcribed and may not be corrected upon review.  Patient is also been seen by primary care recently for ongoing fatigue.  Last seen on 12/07/2019 and a virtual visit on 12/14/2019 for shortness of breath.  Plan of care from 12/14/2019's office visit with [name] was as follows:  #Extreme Weakness.  Ongoing cough and shortness of breath as well as appetite suppression S: Patient stated that he is unable  to do day to day task without getting exhausted. He stated that it takes him a couple seconds to recover, but as soon as he starts walking again the feeling of weakness comes back.   From mychart  message:  "I have finished with the antibiotics, and I can't see any improvement. I still can barely get from room to room, appetite is poor, and cough is no better. If I walk from my recliner to the kitchen, less than 30 feet, I arrive exhausted and panting. I got a COVID test on Monday, and it came back negative. I do have one bit of information that I didn't have at your office. You asked when this started and I wasn't sure. I'm sure now. I attended a luncheon on Wednesday, 9/1, with some high school classmates, and at that time I felt fine and the appetite was good. The next day I felt the way I do now, and thought, "There's no way I can do my gym workout tomorrow." So this problem occurred sometime in that 24-hour period. I hope that helps."  On call states no improvement. No calf swelling or pain. Still very low appetite. No worsenign of symptoms just stable. No chest pain  Did have pneumonitis earlier this year- did not feel same level of fatigue. Previously did have similar reaction (fatigue, low appetite on hydrocodone)  A/P: Patient with weakness, shortness of breath, cough, appetite suppression who appeared to have pneumonia on chest x-ray-has been treated with Augmentin and azithromycin without improvement -Patient reports having blood drawn at [facility] but we do not have any results yet-team will reach out to [facility] and see if we can have this processed or if we need to order labs again-our team was unable to draw his blood.  He has a port they can access if needed -We will try to get CT angiogram (likely need blood work first for creatinine) -Get echocardiogram and refer to cardiology -May need pulmonary referral -I am going to update Dr. Earlie Server his oncologist of this work-up  CT angio chest completed on 12/14/2019 results were as follows:  12/14/2019-CTA chest-negative exam for PE, new extensive bilateral groundglass and heterogeneous airspace opacity, consistent  with multifocal infection, including COVID-19 airspace disease if clinically suspected, inflammatory etiology such as drug toxicity are also a differential consideration, no significant change in posttreatment appearance of the right lower lobe fibrotic consolidation and bronchiectasis, unchanged small right pleural effusion, mild diffuse bilateral bronchial wall thickening consistent with nonspecific inflammatory bronchitis.  12/11/19 - Sarscov2 - negative ([facility])   Patient is status post Augmentin for 7 days and Z-Pak and prednisone taper.  He denies a fever.  He continues to have a productive cough with clear mucus.  He denies chest pain or heart racing.  No previous connective tissue disorders.  He does have a history of pneumonitis.  Patient continues to have increased shortness of breath as well as loss of appetite.  He reports that this presented suddenly.  He is unsure how to best proceed.  We will discuss this today.   Observations/Objective:  Social History   Tobacco Use  Smoking Status Former Smoker  . Packs/day: 1.50  . Years: 10.00  . Pack years: 15.00  . Types: Cigarettes  . Quit date: 07/08/1977  . Years since quitting: 42.4  Smokeless Tobacco Never Used   Immunization History  Administered Date(s) Administered  . Fluad Quad(high Dose 65+) 01/16/2019  . Influenza Split 12/22/2011  . Influenza Whole 01/18/2008, 01/04/2009  .  Influenza, High Dose Seasonal PF 01/02/2016, 01/21/2017, 02/10/2018  . Influenza,inj,Quad PF,6+ Mos 12/23/2012, 02/20/2014, 12/21/2014  . Moderna SARS-COVID-2 Vaccination 04/29/2019, 05/27/2019  . Pneumococcal Conjugate-13 06/21/2014  . Pneumococcal Polysaccharide-23 01/18/2008  . Td 11/21/2001  . Tdap 12/22/2011  . Zoster 01/18/2008  . Zoster Recombinat (Shingrix) 02/03/2018, 04/07/2018      Assessment and Plan:  Complex case.  Reviewed CTA chest with patient.  We will also discussed case with Dr. Ander Slade and Dr. Earlie Server.  We will CC note with  [name].  It is reassuring the patient's PCR for SARS-CoV-2 was negative on 12/11/2019.  Shortness of breath Sudden acute worsening shortness of breath at the beginning of September/2021 Most recent CTA chest negative for PE No recent lab work on file History of pneumonitis Recent CTA chest shows significant groundglass  Plan: Lab work today Close follow-up with our office in 2 to 4 weeks We will send staff message to Dr. Earlie Server and Dr. Jenetta Downer Encourage patient to obtain lab work from primary care virtual visit yesterday Prednisone taper Levaquin today  Stage III squamous cell carcinoma of right lung ([place]) Plan: We will route message to Dr. Earlie Server to make sure he is aware of patient's current symptoms as well as recent CT chest  Abnormal findings on diagnostic imaging of lung Plan: Lab work today Long term prednisone taper Levaquin today We will send staff message and discussed case with Dr. Jenetta Downer, may need to consider bronchoscopy if symptoms or not improving    Follow Up Instructions:  Return in about 4 weeks (around 01/12/2020), or if symptoms worsen or fail to improve, for Follow up with Dr. Ander Slade.   I discussed the assessment and treatment plan with the patient. The patient was provided an opportunity to ask questions and all were answered. The patient agreed with the plan and demonstrated an understanding of the instructions.   The patient was advised to call back or seek an in-person evaluation if the symptoms worsen or if the condition fails to improve as anticipated.  I provided 25 minutes of non-face-to-face time during this encounter.   Lauraine Rinne, NP

## 2019-12-15 NOTE — Assessment & Plan Note (Signed)
Plan: We will route message to Dr. Earlie Server to make sure he is aware of patient's current symptoms as well as recent CT chest

## 2019-12-15 NOTE — Patient Instructions (Addendum)
You were seen today by Lauraine Rinne, NP  for:  1. Stage III squamous cell carcinoma of right lung (HCC)  We will route note to Dr. Earlie Server  Keep follow-up with oncology  2. Abnormal findings on diagnostic imaging of lung  - predniSONE (DELTASONE) 10 MG tablet; Take 4 tablets (40 mg total) by mouth daily with breakfast for 7 days, THEN 3 tablets (30 mg total) daily with breakfast for 7 days, THEN 2 tablets (20 mg total) daily with breakfast for 7 days, THEN 1 tablet (10 mg total) daily with breakfast for 7 days.  Dispense: 20 tablet; Refill: 0 - levofloxacin (LEVAQUIN) 500 MG tablet; Take 1 tablet (500 mg total) by mouth daily.  Dispense: 10 tablet; Refill: 0 - CBC with Differential/Platelet; Future - ANCA Screen Reflex Titer; Future - Sedimentation rate; Future - B Nat Peptide; Future  3. Shortness of breath  - predniSONE (DELTASONE) 10 MG tablet; Take 4 tablets (40 mg total) by mouth daily with breakfast for 7 days, THEN 3 tablets (30 mg total) daily with breakfast for 7 days, THEN 2 tablets (20 mg total) daily with breakfast for 7 days, THEN 1 tablet (10 mg total) daily with breakfast for 7 days.  Dispense: 20 tablet; Refill: 0 - CBC with Differential/Platelet; Future - ANCA Screen Reflex Titer; Future - Sedimentation rate; Future - B Nat Peptide; Future  Obtain additional lab work that primary care ordered yesterday  We recommend today:  Orders Placed This Encounter  Procedures  . CBC with Differential/Platelet    Use port    Standing Status:   Future    Standing Expiration Date:   12/14/2020  . ANCA Screen Reflex Titer    Use port    Standing Status:   Future    Standing Expiration Date:   12/14/2020  . Sedimentation rate    Use port    Standing Status:   Future    Standing Expiration Date:   12/14/2020  . B Nat Peptide    Standing Status:   Future    Standing Expiration Date:   12/14/2020   Orders Placed This Encounter  Procedures  . CBC with Differential/Platelet  .  ANCA Screen Reflex Titer  . Sedimentation rate  . B Nat Peptide   Meds ordered this encounter  Medications  . predniSONE (DELTASONE) 10 MG tablet    Sig: Take 4 tablets (40 mg total) by mouth daily with breakfast for 7 days, THEN 3 tablets (30 mg total) daily with breakfast for 7 days, THEN 2 tablets (20 mg total) daily with breakfast for 7 days, THEN 1 tablet (10 mg total) daily with breakfast for 7 days.    Dispense:  20 tablet    Refill:  0  . levofloxacin (LEVAQUIN) 500 MG tablet    Sig: Take 1 tablet (500 mg total) by mouth daily.    Dispense:  10 tablet    Refill:  0    Follow Up:    Return in about 4 weeks (around 01/12/2020), or if symptoms worsen or fail to improve, for Follow up with Dr. Ander Slade.   Notification of test results are managed in the following manner: If there are  any recommendations or changes to the  plan of care discussed in office today,  we will contact you and let you know what they are. If you do not hear from [place], then your results are normal and you can view them through your  MyChart account , or a  letter will be sent to you. Thank you again for trusting [place] with your care  - Thank you, [facility]    It is flu season:   >>> Best ways to protect herself from the flu: Receive the yearly flu vaccine, practice good hand hygiene washing with soap and also using hand sanitizer when available, eat a nutritious meals, get adequate rest, hydrate appropriately       Please contact the office if your symptoms worsen or you have concerns that you are not improving.   Thank you for choosing [facility] for your healthcare, and for allowing [place] to partner with you on your healthcare journey. I am thankful to be able to provide care to you today.   Wyn Quaker FNP-C

## 2019-12-15 NOTE — Assessment & Plan Note (Signed)
Plan: Lab work today Long term prednisone taper Levaquin today We will send staff message and discussed case with Dr. Jenetta Downer, may need to consider bronchoscopy if symptoms or not improving

## 2019-12-15 NOTE — Progress Notes (Signed)
Virtual Visit via Telephone Note  I connected with Martin Bailey on 12/15/19 at 12:00 PM EDT by telephone and verified that I am speaking with the correct person using two identifiers.  Location: Patient: Home Provider: Office [facility] - [address], [place], [place], [place] [address]   I discussed the limitations, risks, security and privacy concerns of performing an evaluation and management service by telephone and the availability of in person appointments. I also discussed with the patient that there may be a patient responsible charge related to this service. The patient expressed understanding and agreed to proceed.  Patient consented to consult via telephone: Yes People present and their role in pt care: Pt     History of Present Illness:  79 year old male former smoker followed in our office for squamous cell carcinoma of the lung, dyspnea on exertion, cough  Past medical history: Hyperlipidemia, depression, hypertension, A. fib, chronic anticoagulation, GERD Former smoker.  Quit 1979.  15-pack-year smoking history Maintenance: Patient of Dr. Jenetta Downer  Chief complaint:    79 year old male former smoker followed by Dr. Jenetta Downer in our office.  He is status post bronchoscopy with Dr. Jenetta Downer in October/2020.  Patient has now been established with oncology.  Last seen by Dr. Earlie Server in July/10/2019.  Assessment and plan from that office visit was as follows:  ASSESSMENT AND PLAN: This is a very pleasant 79 years old white male with a stage IIIb non-small cell lung cancer, squamous cell carcinoma diagnosed in October 2020 status post course of concurrent chemoradiation with 5 cycles of chemotherapy with carboplatin and paclitaxel. The patient tolerated his treatment well except for pancytopenia. The patient had partial response after his treatment. He is currently undergoing consolidation treatment with immunotherapy with Imfinzi 1500 mg IV every 4 weeks status post 5  cycles.    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 His treatment was discontinued because of the persistent and recurrent immunotherapy mediated pneumonitis. The patient started on a tapered dose of prednisone and over the last few weeks he felt much better with more energy and appetite as well as less cough and shortness of breath. He had repeat CT scan of the chest performed recently.  I personally and independently reviewed the scans and discussed the results with the patient today. His scan showed further improvement of his disease with no concerning findings of progression. I recommended for the patient to continue on observation with repeat CT scan of the chest in 3 months. He will finish the tapered dose of prednisone as planned. He was advised to call immediately if he has any concerning symptoms in the interval. The patient voices understanding of current disease status and treatment options and is in agreement with the current care plan.  All questions were answered. The patient knows to call the clinic with any problems, questions or concerns. We can certainly see the patient much sooner if necessary.  Disclaimer: This note was dictated with voice recognition software. Similar sounding words can inadvertently be transcribed and may not be corrected upon review.  Patient is also been seen by primary care recently for ongoing fatigue.  Last seen on 12/07/2019 and a virtual visit on 12/14/2019 for shortness of breath.  Plan of care from 12/14/2019's office visit with [name] was as follows:  #Extreme Weakness.  Ongoing cough and shortness of breath as well as appetite suppression S: Patient stated that he is unable  to do day to day task without getting exhausted. He stated that it takes him a couple seconds to recover, but as soon as he starts walking again the feeling of weakness comes back.   From mychart  message:  "I have finished with the antibiotics, and I can't see any improvement. I still can barely get from room to room, appetite is poor, and cough is no better. If I walk from my recliner to the kitchen, less than 30 feet, I arrive exhausted and panting. I got a COVID test on Monday, and it came back negative. I do have one bit of information that I didn't have at your office. You asked when this started and I wasn't sure. I'm sure now. I attended a luncheon on Wednesday, 9/1, with some high school classmates, and at that time I felt fine and the appetite was good. The next day I felt the way I do now, and thought, "There's no way I can do my gym workout tomorrow." So this problem occurred sometime in that 24-hour period. I hope that helps."  On call states no improvement. No calf swelling or pain. Still very low appetite. No worsenign of symptoms just stable. No chest pain  Did have pneumonitis earlier this year- did not feel same level of fatigue. Previously did have similar reaction (fatigue, low appetite on hydrocodone)  A/P: Patient with weakness, shortness of breath, cough, appetite suppression who appeared to have pneumonia on chest x-ray-has been treated with Augmentin and azithromycin without improvement -Patient reports having blood drawn at [facility] but we do not have any results yet-team will reach out to [facility] and see if we can have this processed or if we need to order labs again-our team was unable to draw his blood.  He has a port they can access if needed -We will try to get CT angiogram (likely need blood work first for creatinine) -Get echocardiogram and refer to cardiology -May need pulmonary referral -I am going to update Dr. Earlie Server his oncologist of this work-up  CT angio chest completed on 12/14/2019 results were as follows:  12/14/2019-CTA chest-negative exam for PE, new extensive bilateral groundglass and heterogeneous airspace opacity, consistent  with multifocal infection, including COVID-19 airspace disease if clinically suspected, inflammatory etiology such as drug toxicity are also a differential consideration, no significant change in posttreatment appearance of the right lower lobe fibrotic consolidation and bronchiectasis, unchanged small right pleural effusion, mild diffuse bilateral bronchial wall thickening consistent with nonspecific inflammatory bronchitis.  12/11/19 - Sarscov2 - negative ([facility])   Patient is status post Augmentin for 7 days and Z-Pak and prednisone taper.  He denies a fever.  He continues to have a productive cough with clear mucus.  He denies chest pain or heart racing.  No previous connective tissue disorders.  He does have a history of pneumonitis.  Patient continues to have increased shortness of breath as well as loss of appetite.  He reports that this presented suddenly.  He is unsure how to best proceed.  We will discuss this today.   Observations/Objective:  Social History   Tobacco Use  Smoking Status Former Smoker  . Packs/day: 1.50  . Years: 10.00  . Pack years: 15.00  . Types: Cigarettes  . Quit date: 07/08/1977  . Years since quitting: 42.4  Smokeless Tobacco Never Used   Immunization History  Administered Date(s) Administered  . Fluad Quad(high Dose 65+) 01/16/2019  . Influenza Split 12/22/2011  . Influenza Whole 01/18/2008, 01/04/2009  .  Influenza, High Dose Seasonal PF 01/02/2016, 01/21/2017, 02/10/2018  . Influenza,inj,Quad PF,6+ Mos 12/23/2012, 02/20/2014, 12/21/2014  . Moderna SARS-COVID-2 Vaccination 04/29/2019, 05/27/2019  . Pneumococcal Conjugate-13 06/21/2014  . Pneumococcal Polysaccharide-23 01/18/2008  . Td 11/21/2001  . Tdap 12/22/2011  . Zoster 01/18/2008  . Zoster Recombinat (Shingrix) 02/03/2018, 04/07/2018      Assessment and Plan:  Complex case.  Reviewed CTA chest with patient.  We will also discussed case with Dr. Ander Slade and Dr. Earlie Server.  We will CC note with  [name].  It is reassuring the patient's PCR for SARS-CoV-2 was negative on 12/11/2019.  Shortness of breath Sudden acute worsening shortness of breath at the beginning of September/2021 Most recent CTA chest negative for PE No recent lab work on file History of pneumonitis Recent CTA chest shows significant groundglass  Plan: Lab work today Close follow-up with our office in 2 to 4 weeks We will send staff message to Dr. Earlie Server and Dr. Jenetta Downer Encourage patient to obtain lab work from primary care virtual visit yesterday Prednisone taper Levaquin today  Stage III squamous cell carcinoma of right lung ([place]) Plan: We will route message to Dr. Earlie Server to make sure he is aware of patient's current symptoms as well as recent CT chest  Abnormal findings on diagnostic imaging of lung Plan: Lab work today Long term prednisone taper Levaquin today We will send staff message and discussed case with Dr. Jenetta Downer, may need to consider bronchoscopy if symptoms or not improving    Follow Up Instructions:  Return in about 4 weeks (around 01/12/2020), or if symptoms worsen or fail to improve, for Follow up with Dr. Ander Slade.   I discussed the assessment and treatment plan with the patient. The patient was provided an opportunity to ask questions and all were answered. The patient agreed with the plan and demonstrated an understanding of the instructions.   The patient was advised to call back or seek an in-person evaluation if the symptoms worsen or if the condition fails to improve as anticipated.  I provided 25 minutes of non-face-to-face time during this encounter.   Lauraine Rinne, NP

## 2019-12-18 ENCOUNTER — Telehealth: Payer: Self-pay | Admitting: Pulmonary Disease

## 2019-12-18 NOTE — Telephone Encounter (Signed)
Spoke with pharm and gave clarification on pred  Nothing further needed

## 2019-12-18 NOTE — Telephone Encounter (Signed)
CPT Code [phone] was approved by [facility]. Auth # [id number]

## 2019-12-19 ENCOUNTER — Telehealth: Payer: Self-pay | Admitting: Pulmonary Disease

## 2019-12-19 ENCOUNTER — Other Ambulatory Visit: Payer: Medicare Other

## 2019-12-19 NOTE — Telephone Encounter (Signed)
Please schedule the following:  Diagnosis: Abnormal CT chest Procedure: Bronchoscopy Anesthesia: General Do you need Fluro?  Yes Priority: As soon as possible Date: Flexible Alternate Date: Flexible--scheduled in the hospital next week, currently any day next week 27-1st Time: AM preferable  Location: [place] Does patient have OSA?  No DM?   Or Latex allergy?  No Medication Restriction: None Anticoagulate/Antiplatelet: no Pre-op Labs Ordered: CBC, CMP, PT/INR, PTT Imaging request: none  (If, SuperDimension CT Chest, please have STAT courier sent to [place].)  Please coordinate Pre-op COVID Testing

## 2019-12-19 NOTE — Telephone Encounter (Signed)
[name],  Please fax orders for labs to [phone]. Please make sure orders state " Doctor approves port access".  Once faxed please let me know so I can call patient to go today.

## 2019-12-19 NOTE — Telephone Encounter (Signed)
[name] has faxed orders over.  I have confirmed with [facility] that orders were received.  I have left patient vm to head to [facility] admitting.  Informed patient that lab would prefer him to arrive before 5pm but it was not a requirement.

## 2019-12-20 ENCOUNTER — Other Ambulatory Visit (HOSPITAL_COMMUNITY)
Admission: RE | Admit: 2019-12-20 | Discharge: 2019-12-20 | Disposition: A | Discharge status: Home / Self Care | Payer: Medicare Other | Source: Ambulatory Visit | Attending: Family Medicine | Admitting: Family Medicine

## 2019-12-20 DIAGNOSIS — R0602 Shortness of breath: Secondary | ICD-10-CM | POA: Insufficient documentation

## 2019-12-20 DIAGNOSIS — R918 Other nonspecific abnormal finding of lung field: Secondary | ICD-10-CM | POA: Diagnosis not present

## 2019-12-20 DIAGNOSIS — R5383 Other fatigue: Secondary | ICD-10-CM | POA: Insufficient documentation

## 2019-12-20 LAB — COMPREHENSIVE METABOLIC PANEL
ALT: 27 U/L (ref 0–44)
AST: 29 U/L (ref 15–41)
Albumin: 3.2 g/dL — ABNORMAL LOW (ref 3.5–5.0)
Alkaline Phosphatase: 69 U/L (ref 38–126)
Anion gap: 10 (ref 5–15)
BUN: 15 mg/dL (ref 8–23)
CO2: 23 mmol/L (ref 22–32)
Calcium: 8.8 mg/dL — ABNORMAL LOW (ref 8.9–10.3)
Chloride: 101 mmol/L (ref 98–111)
Creatinine, Ser: 0.84 mg/dL (ref 0.61–1.24)
GFR calc Af Amer: >60 mL/min (ref >60)
GFR calc non Af Amer: >60 mL/min (ref >60)
Glucose, Bld: 128 mg/dL — ABNORMAL HIGH (ref 70–99)
Potassium: 4.2 mmol/L (ref 3.5–5.1)
Sodium: 134 mmol/L — ABNORMAL LOW (ref 135–145)
Total Bilirubin: 0.5 mg/dL (ref 0.3–1.2)
Total Protein: 6.6 g/dL (ref 6.5–8.1)

## 2019-12-20 LAB — CBC WITH DIFFERENTIAL/PLATELET
Abs Immature Granulocytes: 0.36 10*3/uL — ABNORMAL HIGH (ref 0.00–0.07)
Basophils Absolute: 0.1 10*3/uL (ref 0.0–0.1)
Basophils Relative: 1 %
Eosinophils Absolute: 0.1 10*3/uL (ref 0.0–0.5)
Eosinophils Relative: 0 %
HCT: 38.3 % — ABNORMAL LOW (ref 39.0–52.0)
Hemoglobin: 12.7 g/dL — ABNORMAL LOW (ref 13.0–17.0)
Immature Granulocytes: 3 %
Lymphocytes Relative: 6 %
Lymphs Abs: 0.8 10*3/uL (ref 0.7–4.0)
MCH: 31.6 pg (ref 26.0–34.0)
MCHC: 33.2 g/dL (ref 30.0–36.0)
MCV: 95.3 fL (ref 80.0–100.0)
Monocytes Absolute: 0.9 10*3/uL (ref 0.1–1.0)
Monocytes Relative: 6 %
Neutro Abs: 12.4 10*3/uL — ABNORMAL HIGH (ref 1.7–7.7)
Neutrophils Relative %: 84 %
Platelets: 231 10*3/uL (ref 150–400)
RBC: 4.02 MIL/uL — ABNORMAL LOW (ref 4.22–5.81)
RDW: 14.5 % (ref 11.5–15.5)
WBC: 14.6 10*3/uL — ABNORMAL HIGH (ref 4.0–10.5)
nRBC: 0 % (ref 0.0–0.2)

## 2019-12-20 LAB — ABO/RH: ABO/RH(D): O POS

## 2019-12-20 LAB — HEPATITIS C ANTIBODY: HCV Ab: NONREACTIVE

## 2019-12-20 LAB — SEDIMENTATION RATE: Sed Rate: 26 mm/hr — ABNORMAL HIGH (ref 0–16)

## 2019-12-20 LAB — BRAIN NATRIURETIC PEPTIDE: B Natriuretic Peptide: 636.3 pg/mL — ABNORMAL HIGH (ref 0.0–100.0)

## 2019-12-20 LAB — TSH: TSH: 2.508 u[IU]/mL (ref 0.350–4.500)

## 2019-12-20 MED ORDER — HEPARIN SOD (PORK) LOCK FLUSH 100 UNIT/ML IV SOLN
500.0000 [IU] | INTRAVENOUS | Status: AC | PRN
  Administered 2019-12-20: 500 [IU]
  Filled 2019-12-20: qty 5

## 2019-12-20 NOTE — Telephone Encounter (Signed)
I have spoken with patient this morning.  He is going to [facility] lab today.

## 2019-12-21 ENCOUNTER — Ambulatory Visit: Payer: Medicare Other | Admitting: Pulmonary Disease

## 2019-12-21 ENCOUNTER — Encounter: Payer: Self-pay | Admitting: Family Medicine

## 2019-12-22 LAB — HCV RNA BY PCR, QN RFX GENO
HCV RNA Qnt(log copy/mL): UNDETERMINED log10 IU/mL
HepC Qn: NOT DETECTED IU/mL

## 2019-12-25 ENCOUNTER — Encounter: Payer: Self-pay | Admitting: Pulmonary Disease

## 2019-12-25 ENCOUNTER — Telehealth: Payer: Self-pay | Admitting: Pulmonary Disease

## 2019-12-25 ENCOUNTER — Other Ambulatory Visit: Payer: Self-pay | Admitting: Family Medicine

## 2019-12-25 DIAGNOSIS — R0602 Shortness of breath: Secondary | ICD-10-CM

## 2019-12-25 DIAGNOSIS — R918 Other nonspecific abnormal finding of lung field: Secondary | ICD-10-CM

## 2019-12-25 MED ORDER — PREDNISONE 10 MG PO TABS: ORAL_TABLET | ORAL | 0 refills | Status: DC

## 2019-12-25 NOTE — Telephone Encounter (Signed)
Went to call patient regarding bronchoscopy he states he has not heard about this procedure and needs to speak with Dr. Ander Slade

## 2019-12-25 NOTE — Telephone Encounter (Signed)
Message from [place] from encounter 9/20:  Lauraine Rinne, NP   Note 12/25/2019  It seems there was an error when the initial prednisone taper was sent in.  Can we please ensure the patient has enough prednisone in order to complete the taper as previously ordered:  Prednisone 10 mg tablet 4 tablets daily (40 mg total) for 7 days, then 3 tablets daily (30 mg total) for 7 days, then 2 tablets daily (20 mg total) for 7 days, then 1 tablet daily (10 mg total) for 7 days.  It seems originally this was only sent in as a 20 tablet refill.  When in reality patient needs 70 tablets in order to complete this taper.  Can you please ensure that this was corrected today and the patient is contacted.  Wyn Quaker, FNP     Called pt's pharmacy and spoke with Velva Harman at the pharmacy to confirm instructions on pt's prednisone Rx and also the quantity. Velva Harman verbalized understanding.  Called and spoke with pt in regards to the instructions and quantity for prednisone and he verbalized understanding stating that he had this fixed. Nothing further needed.

## 2019-12-25 NOTE — Telephone Encounter (Signed)
Spoke with Dr. Ander Slade and Patient. Dr. Ander Slade wants patient to hold blood thinner for 5 days, so cannot be done this week. Patient is okay with that as his daughter cannot drive him to procedure. We have looked at ta 0730 time for procedure 10/11,10/12 or 10/17-10/20.  Patient will call back when a good dates is for him.  I will route to Dr. Ander Slade as Juluis Rainier

## 2019-12-25 NOTE — Telephone Encounter (Signed)
See encounter from today 9/27. Pharmacy and pt were both spoken to and all has been taken care of. Nothing further needed.

## 2019-12-25 NOTE — Telephone Encounter (Signed)
12/25/2019  Attempted to reach the patient.  Was unsuccessful.  Phone continue to ring and did not go to voicemail.  If patient calls back please explain that a bronchoscopy is to further evaluate the abnormal CT of his chest.  We did go ahead and treat him with Levaquin given his the acute nature of his symptoms.  This will also help further evaluate the acute nature of his symptoms.  I explained to the patient at the last office visit that this was a potential option to further evaluate his symptoms.  But I would discuss the case with Dr. Jenetta Downer first.  Dr. Jenetta Downer agreed that a bronchoscopy would be beneficial given the abnormal CT.  This is why we are proceeding forward.  Dr. Earlie Server is aware of this work-up.  As it is primary care.  Would recommend the patient proceed forward with getting scheduled for the bronchoscopy with Dr. Jenetta Downer.  We will CC Dr. Nat Math FNP

## 2019-12-25 NOTE — Telephone Encounter (Signed)
12/25/2019  It seems there was an error when the initial prednisone taper was sent in.  Can we please ensure the patient has enough prednisone in order to complete the taper as previously ordered:  Prednisone 10 mg tablet 4 tablets daily (40 mg total) for 7 days, then 3 tablets daily (30 mg total) for 7 days, then 2 tablets daily (20 mg total) for 7 days, then 1 tablet daily (10 mg total) for 7 days.  It seems originally this was only sent in as a 20 tablet refill.  When in reality patient needs 70 tablets in order to complete this taper.  Can you please ensure that this was corrected today and the patient is contacted.  Wyn Quaker, FNP

## 2019-12-25 NOTE — Telephone Encounter (Signed)
Left a voicemail for patient to contact our office back regarding prior message.

## 2019-12-26 ENCOUNTER — Telehealth: Payer: Self-pay | Admitting: Pulmonary Disease

## 2019-12-26 ENCOUNTER — Other Ambulatory Visit (HOSPITAL_COMMUNITY): Payer: Medicare Other

## 2019-12-26 DIAGNOSIS — R918 Other nonspecific abnormal finding of lung field: Secondary | ICD-10-CM

## 2019-12-26 DIAGNOSIS — Z5181 Encounter for therapeutic drug level monitoring: Secondary | ICD-10-CM

## 2019-12-26 NOTE — Telephone Encounter (Signed)
Called and spoke with patient. Let them know their Bronch is scheduled for 10/12 with Dr. Ander Slade at [facility] [place].  Patient was instructed to arrive at hospital at 0600. They were instructed to bring someone with them as they will not be able to drive home from procedure. Patient instructed not to have anything to eat or drink after midnight. Patient needs to hold their blood thinner 5 days prior to procedure.   Patient's covid screening is scheduled at [facility] location  for 01/06/20 at 1000.  Patient voiced understanding, nothing further needed  Patient will try to get pre-procedural labs done at Cancer center on 01/05/20. CBC w diff / platelets, aptt, cmp, pt-inr  Routing to AO and Dr. Julien Nordmann as Juluis Rainier

## 2020-01-03 ENCOUNTER — Ambulatory Visit (HOSPITAL_COMMUNITY): Payer: Medicare Other | Attending: Cardiovascular Disease

## 2020-01-03 ENCOUNTER — Other Ambulatory Visit: Payer: Self-pay

## 2020-01-03 DIAGNOSIS — R0602 Shortness of breath: Secondary | ICD-10-CM | POA: Diagnosis not present

## 2020-01-03 LAB — ECHOCARDIOGRAM COMPLETE
Area-P 1/2: 4.53 cm2
S' Lateral: 3.5 cm

## 2020-01-05 ENCOUNTER — Inpatient Hospital Stay: Payer: Medicare Other | Attending: Internal Medicine

## 2020-01-05 ENCOUNTER — Telehealth: Payer: Self-pay | Admitting: Medical Oncology

## 2020-01-05 ENCOUNTER — Ambulatory Visit (HOSPITAL_COMMUNITY)
Admission: RE | Admit: 2020-01-05 | Discharge: 2020-01-05 | Disposition: A | Discharge status: Home / Self Care | Payer: Medicare Other | Source: Ambulatory Visit | Attending: Internal Medicine | Admitting: Internal Medicine

## 2020-01-05 ENCOUNTER — Other Ambulatory Visit: Payer: Self-pay

## 2020-01-05 DIAGNOSIS — C3431 Malignant neoplasm of lower lobe, right bronchus or lung: Secondary | ICD-10-CM | POA: Insufficient documentation

## 2020-01-05 DIAGNOSIS — I4891 Unspecified atrial fibrillation: Secondary | ICD-10-CM | POA: Insufficient documentation

## 2020-01-05 DIAGNOSIS — I10 Essential (primary) hypertension: Secondary | ICD-10-CM | POA: Diagnosis not present

## 2020-01-05 DIAGNOSIS — R630 Anorexia: Secondary | ICD-10-CM | POA: Diagnosis not present

## 2020-01-05 DIAGNOSIS — E875 Hyperkalemia: Secondary | ICD-10-CM | POA: Insufficient documentation

## 2020-01-05 DIAGNOSIS — J90 Pleural effusion, not elsewhere classified: Secondary | ICD-10-CM | POA: Insufficient documentation

## 2020-01-05 DIAGNOSIS — Z7901 Long term (current) use of anticoagulants: Secondary | ICD-10-CM | POA: Diagnosis not present

## 2020-01-05 DIAGNOSIS — Z79899 Other long term (current) drug therapy: Secondary | ICD-10-CM | POA: Insufficient documentation

## 2020-01-05 DIAGNOSIS — E785 Hyperlipidemia, unspecified: Secondary | ICD-10-CM | POA: Insufficient documentation

## 2020-01-05 DIAGNOSIS — D61818 Other pancytopenia: Secondary | ICD-10-CM | POA: Diagnosis not present

## 2020-01-05 DIAGNOSIS — I313 Pericardial effusion (noninflammatory): Secondary | ICD-10-CM | POA: Insufficient documentation

## 2020-01-05 DIAGNOSIS — J470 Bronchiectasis with acute lower respiratory infection: Secondary | ICD-10-CM | POA: Insufficient documentation

## 2020-01-05 DIAGNOSIS — I517 Cardiomegaly: Secondary | ICD-10-CM | POA: Insufficient documentation

## 2020-01-05 DIAGNOSIS — Z87442 Personal history of urinary calculi: Secondary | ICD-10-CM | POA: Insufficient documentation

## 2020-01-05 DIAGNOSIS — Z885 Allergy status to narcotic agent status: Secondary | ICD-10-CM | POA: Diagnosis not present

## 2020-01-05 DIAGNOSIS — Z87891 Personal history of nicotine dependence: Secondary | ICD-10-CM | POA: Diagnosis not present

## 2020-01-05 DIAGNOSIS — R0609 Other forms of dyspnea: Secondary | ICD-10-CM | POA: Insufficient documentation

## 2020-01-05 DIAGNOSIS — M47814 Spondylosis without myelopathy or radiculopathy, thoracic region: Secondary | ICD-10-CM | POA: Diagnosis not present

## 2020-01-05 DIAGNOSIS — I700 Atherosclerosis of aorta: Secondary | ICD-10-CM | POA: Insufficient documentation

## 2020-01-05 DIAGNOSIS — C3490 Malignant neoplasm of unspecified part of unspecified bronchus or lung: Secondary | ICD-10-CM

## 2020-01-05 DIAGNOSIS — J189 Pneumonia, unspecified organism: Secondary | ICD-10-CM | POA: Diagnosis not present

## 2020-01-05 DIAGNOSIS — Z8719 Personal history of other diseases of the digestive system: Secondary | ICD-10-CM | POA: Diagnosis not present

## 2020-01-05 DIAGNOSIS — I2510 Atherosclerotic heart disease of native coronary artery without angina pectoris: Secondary | ICD-10-CM | POA: Diagnosis not present

## 2020-01-05 LAB — CBC WITH DIFFERENTIAL (CANCER CENTER ONLY)
Abs Immature Granulocytes: 0.3 10*3/uL — ABNORMAL HIGH (ref 0.00–0.07)
Basophils Absolute: 0.1 10*3/uL (ref 0.0–0.1)
Basophils Relative: 1 %
Eosinophils Absolute: 0.1 10*3/uL (ref 0.0–0.5)
Eosinophils Relative: 0 %
HCT: 46.8 % (ref 39.0–52.0)
Hemoglobin: 15.2 g/dL (ref 13.0–17.0)
Immature Granulocytes: 3 %
Lymphocytes Relative: 12 %
Lymphs Abs: 1.3 10*3/uL (ref 0.7–4.0)
MCH: 31 pg (ref 26.0–34.0)
MCHC: 32.5 g/dL (ref 30.0–36.0)
MCV: 95.3 fL (ref 80.0–100.0)
Monocytes Absolute: 0.5 10*3/uL (ref 0.1–1.0)
Monocytes Relative: 4 %
Neutro Abs: 9.1 10*3/uL — ABNORMAL HIGH (ref 1.7–7.7)
Neutrophils Relative %: 80 %
Platelet Count: 149 10*3/uL — ABNORMAL LOW (ref 150–400)
RBC: 4.91 MIL/uL (ref 4.22–5.81)
RDW: 14.3 % (ref 11.5–15.5)
WBC Count: 11.4 10*3/uL — ABNORMAL HIGH (ref 4.0–10.5)
nRBC: 0 % (ref 0.0–0.2)

## 2020-01-05 LAB — CMP (CANCER CENTER ONLY)
ALT: 47 U/L — ABNORMAL HIGH (ref 0–44)
AST: 30 U/L (ref 15–41)
Albumin: 3.7 g/dL (ref 3.5–5.0)
Alkaline Phosphatase: 69 U/L (ref 38–126)
Anion gap: 4 — ABNORMAL LOW (ref 5–15)
BUN: 20 mg/dL (ref 8–23)
CO2: 28 mmol/L (ref 22–32)
Calcium: 9.4 mg/dL (ref 8.9–10.3)
Chloride: 105 mmol/L (ref 98–111)
Creatinine: 0.93 mg/dL (ref 0.61–1.24)
GFR, Estimated: >60 mL/min (ref >60)
Glucose, Bld: 124 mg/dL — ABNORMAL HIGH (ref 70–99)
Potassium: 5.8 mmol/L — ABNORMAL HIGH (ref 3.5–5.1)
Sodium: 137 mmol/L (ref 135–145)
Total Bilirubin: 0.6 mg/dL (ref 0.3–1.2)
Total Protein: 6.9 g/dL (ref 6.5–8.1)

## 2020-01-05 MED ORDER — IOHEXOL 300 MG/ML  SOLN
75.0000 mL | Freq: Once | INTRAMUSCULAR | Status: AC | PRN
  Administered 2020-01-05: 75 mL via INTRAVENOUS

## 2020-01-05 MED ORDER — HEPARIN SOD (PORK) LOCK FLUSH 100 UNIT/ML IV SOLN: 500.0000 [IU] | Freq: Once | INTRAVENOUS | Status: AC

## 2020-01-05 MED ORDER — HEPARIN SOD (PORK) LOCK FLUSH 100 UNIT/ML IV SOLN
INTRAVENOUS | Status: AC
  Administered 2020-01-05: 500 [IU] via INTRAVENOUS
  Filled 2020-01-05: qty 5

## 2020-01-05 NOTE — Telephone Encounter (Signed)
Extra blood drawn for PT-INR and APTT for [place] order. I told Lauren at [facility] to fax orders with dx code to Dr [facility] fax and I will give it to lab.

## 2020-01-06 ENCOUNTER — Other Ambulatory Visit (HOSPITAL_COMMUNITY)
Admission: RE | Admit: 2020-01-06 | Discharge: 2020-01-06 | Disposition: A | Discharge status: Home / Self Care | Payer: Medicare Other | Source: Ambulatory Visit | Attending: Pulmonary Disease | Admitting: Pulmonary Disease

## 2020-01-06 DIAGNOSIS — Z20822 Contact with and (suspected) exposure to covid-19: Secondary | ICD-10-CM | POA: Diagnosis not present

## 2020-01-06 DIAGNOSIS — Z01812 Encounter for preprocedural laboratory examination: Secondary | ICD-10-CM | POA: Diagnosis not present

## 2020-01-06 LAB — MISC LABCORP TEST (SEND OUT): Labcorp test code: 9985

## 2020-01-06 LAB — SARS CORONAVIRUS 2 (TAT 6-24 HRS): SARS Coronavirus 2: NEGATIVE

## 2020-01-08 ENCOUNTER — Inpatient Hospital Stay (HOSPITAL_BASED_OUTPATIENT_CLINIC_OR_DEPARTMENT_OTHER): Payer: Medicare Other | Admitting: Internal Medicine

## 2020-01-08 ENCOUNTER — Telehealth: Payer: Self-pay | Admitting: Internal Medicine

## 2020-01-08 ENCOUNTER — Encounter: Payer: Self-pay | Admitting: Internal Medicine

## 2020-01-08 ENCOUNTER — Inpatient Hospital Stay: Payer: Medicare Other

## 2020-01-08 ENCOUNTER — Telehealth: Payer: Self-pay | Admitting: Pulmonary Disease

## 2020-01-08 ENCOUNTER — Telehealth: Payer: Medicare Other

## 2020-01-08 ENCOUNTER — Other Ambulatory Visit: Payer: Self-pay

## 2020-01-08 VITALS — BP 121/82 | HR 81 | Temp 98.5°F | Resp 18 | Ht 70.0 in | Wt 197.5 lb

## 2020-01-08 DIAGNOSIS — Z8719 Personal history of other diseases of the digestive system: Secondary | ICD-10-CM | POA: Diagnosis not present

## 2020-01-08 DIAGNOSIS — Z885 Allergy status to narcotic agent status: Secondary | ICD-10-CM | POA: Diagnosis not present

## 2020-01-08 DIAGNOSIS — C3431 Malignant neoplasm of lower lobe, right bronchus or lung: Secondary | ICD-10-CM | POA: Diagnosis not present

## 2020-01-08 DIAGNOSIS — I313 Pericardial effusion (noninflammatory): Secondary | ICD-10-CM | POA: Diagnosis not present

## 2020-01-08 DIAGNOSIS — C3490 Malignant neoplasm of unspecified part of unspecified bronchus or lung: Secondary | ICD-10-CM

## 2020-01-08 DIAGNOSIS — I700 Atherosclerosis of aorta: Secondary | ICD-10-CM | POA: Diagnosis not present

## 2020-01-08 DIAGNOSIS — J90 Pleural effusion, not elsewhere classified: Secondary | ICD-10-CM | POA: Diagnosis not present

## 2020-01-08 DIAGNOSIS — R0609 Other forms of dyspnea: Secondary | ICD-10-CM | POA: Diagnosis not present

## 2020-01-08 DIAGNOSIS — E785 Hyperlipidemia, unspecified: Secondary | ICD-10-CM | POA: Diagnosis not present

## 2020-01-08 DIAGNOSIS — C3491 Malignant neoplasm of unspecified part of right bronchus or lung: Secondary | ICD-10-CM | POA: Diagnosis not present

## 2020-01-08 DIAGNOSIS — I2510 Atherosclerotic heart disease of native coronary artery without angina pectoris: Secondary | ICD-10-CM | POA: Diagnosis not present

## 2020-01-08 DIAGNOSIS — Z87442 Personal history of urinary calculi: Secondary | ICD-10-CM | POA: Diagnosis not present

## 2020-01-08 DIAGNOSIS — D61818 Other pancytopenia: Secondary | ICD-10-CM | POA: Diagnosis not present

## 2020-01-08 DIAGNOSIS — I10 Essential (primary) hypertension: Secondary | ICD-10-CM | POA: Diagnosis not present

## 2020-01-08 DIAGNOSIS — E875 Hyperkalemia: Secondary | ICD-10-CM | POA: Diagnosis not present

## 2020-01-08 DIAGNOSIS — J470 Bronchiectasis with acute lower respiratory infection: Secondary | ICD-10-CM | POA: Diagnosis not present

## 2020-01-08 DIAGNOSIS — Z7901 Long term (current) use of anticoagulants: Secondary | ICD-10-CM | POA: Diagnosis not present

## 2020-01-08 DIAGNOSIS — Z79899 Other long term (current) drug therapy: Secondary | ICD-10-CM | POA: Diagnosis not present

## 2020-01-08 DIAGNOSIS — I4891 Unspecified atrial fibrillation: Secondary | ICD-10-CM | POA: Diagnosis not present

## 2020-01-08 DIAGNOSIS — M47814 Spondylosis without myelopathy or radiculopathy, thoracic region: Secondary | ICD-10-CM | POA: Diagnosis not present

## 2020-01-08 DIAGNOSIS — Z87891 Personal history of nicotine dependence: Secondary | ICD-10-CM | POA: Diagnosis not present

## 2020-01-08 DIAGNOSIS — I517 Cardiomegaly: Secondary | ICD-10-CM | POA: Diagnosis not present

## 2020-01-08 LAB — BASIC METABOLIC PANEL - CANCER CENTER ONLY
Anion gap: 5 (ref 5–15)
BUN: 16 mg/dL (ref 8–23)
CO2: 30 mmol/L (ref 22–32)
Calcium: 9.9 mg/dL (ref 8.9–10.3)
Chloride: 101 mmol/L (ref 98–111)
Creatinine: 0.86 mg/dL (ref 0.61–1.24)
GFR, Estimated: >60 mL/min (ref >60)
Glucose, Bld: 96 mg/dL (ref 70–99)
Potassium: 5 mmol/L (ref 3.5–5.1)
Sodium: 136 mmol/L (ref 135–145)

## 2020-01-08 LAB — CBC WITH DIFFERENTIAL (CANCER CENTER ONLY)
Abs Immature Granulocytes: 0.27 10*3/uL — ABNORMAL HIGH (ref 0.00–0.07)
Basophils Absolute: 0.1 10*3/uL (ref 0.0–0.1)
Basophils Relative: 1 %
Eosinophils Absolute: 0.2 10*3/uL (ref 0.0–0.5)
Eosinophils Relative: 2 %
HCT: 45.1 % (ref 39.0–52.0)
Hemoglobin: 14.8 g/dL (ref 13.0–17.0)
Immature Granulocytes: 2 %
Lymphocytes Relative: 9 %
Lymphs Abs: 1.3 10*3/uL (ref 0.7–4.0)
MCH: 31.2 pg (ref 26.0–34.0)
MCHC: 32.8 g/dL (ref 30.0–36.0)
MCV: 95.1 fL (ref 80.0–100.0)
Monocytes Absolute: 1.2 10*3/uL — ABNORMAL HIGH (ref 0.1–1.0)
Monocytes Relative: 9 %
Neutro Abs: 10.6 10*3/uL — ABNORMAL HIGH (ref 1.7–7.7)
Neutrophils Relative %: 77 %
Platelet Count: 121 10*3/uL — ABNORMAL LOW (ref 150–400)
RBC: 4.74 MIL/uL (ref 4.22–5.81)
RDW: 14.4 % (ref 11.5–15.5)
WBC Count: 13.7 10*3/uL — ABNORMAL HIGH (ref 4.0–10.5)
nRBC: 0 % (ref 0.0–0.2)

## 2020-01-08 LAB — TSH: TSH: 1.959 u[IU]/mL (ref 0.320–4.118)

## 2020-01-08 NOTE — Telephone Encounter (Signed)
Scheduled appointments per 10/11 los. Spoke to patient who is aware of appointments dates and times.

## 2020-01-08 NOTE — Telephone Encounter (Signed)
Called [facility] let her know that we tried calling last week and didn't get up with anyone. Patient is having Bronch done on 01/09/20 will still need pre-op labs drawn.   Called spoke with patient to let him aware Will route to Dr. Ander Slade as Juluis Rainier

## 2020-01-08 NOTE — Anesthesia Preprocedure Evaluation (Addendum)
Anesthesia Evaluation  Patient identified by MRN, date of birth, ID band Patient awake    Reviewed: Allergy & Precautions, NPO status , Patient's Chart, lab work & pertinent test results  History of Anesthesia Complications Negative for: history of anesthetic complications  Airway Mallampati: I  TM Distance: >3 FB Neck ROM: Full    Dental no notable dental hx. (+) Dental Advisory Given   Pulmonary former smoker,  Lung Ca   breath sounds clear to auscultation       Cardiovascular hypertension, Pt. on medications and Pt. on home beta blockers + angina + dysrhythmias Atrial Fibrillation  Rhythm:Irregular Rate:Normal  IMPRESSIONS    1. Left ventricular ejection fraction, by estimation, is 60 to 65%. The  left ventricle has normal function. The left ventricle has no regional  wall motion abnormalities. Left ventricular diastolic function could not  be evaluated.  2. Right ventricular systolic function is normal. The right ventricular  size is normal. Tricuspid regurgitation signal is inadequate for assessing  PA pressure.  3. Left atrial size was severely dilated.  4. Right atrial size was mildly dilated.  5. The mitral valve is degenerative. Trivial mitral valve regurgitation.  No evidence of mitral stenosis.  6. The aortic valve was not well visualized. Aortic valve regurgitation  is not visualized. No aortic stenosis is present.  7. The inferior vena cava is normal in size with greater than 50%  respiratory variability, suggesting right atrial pressure of 3 mmHg.    Neuro/Psych negative neurological ROS     GI/Hepatic negative GI ROS, Neg liver ROS,   Endo/Other  Morbid obesity  Renal/GU negative Renal ROS     Musculoskeletal   Abdominal   Peds  Hematology Xarelto: last dose saturday   Anesthesia Other Findings   Reproductive/Obstetrics                            Anesthesia  Physical  Anesthesia Plan  ASA: III  Anesthesia Plan: General   Post-op Pain Management:    Induction: Intravenous  PONV Risk Score and Plan: 2 and Ondansetron, Dexamethasone and Treatment may vary due to age or medical condition  Airway Management Planned: Oral ETT  Additional Equipment:   Intra-op Plan:   Post-operative Plan: Extubation in OR  Informed Consent: I have reviewed the patients History and Physical, chart, labs and discussed the procedure including the risks, benefits and alternatives for the proposed anesthesia with the patient or authorized representative who has indicated his/her understanding and acceptance.     Dental advisory given  Plan Discussed with: CRNA and Anesthesiologist  Anesthesia Plan Comments:        Anesthesia Quick Evaluation

## 2020-01-08 NOTE — Progress Notes (Signed)
[facility] [phone]   [phone]  OFFICE PROGRESS NOTE  [name], MD [place] [address]  DIAGNOSIS: Stage IIIb (T3, N2, M0) non-small cell lung cancer, squamous cell carcinoma presented with right lower lobe lung mass in addition to subcarinal lymphadenopathy diagnosed in October 2020.  PRIOR THERAPY:  1) Weekly concurrent chemoradiation with Carboplatin for an AUC of 2 Paclitaxel 45 mg/m2. First dose 01/16/2019. Status post5cycles.  Last dose was given February 13, 2019. 2) Consolidation treatment with immunotherapy with Imfinzi 1500 mg IV every 4 weeks.  First dose April 05, 2019.  Status post 5 cycles.  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  CURRENT THERAPY:  Observation.  INTERVAL HISTORY: Martin Bailey 79 y.o. male returns to the clinic today for follow-up visit. The patient is feeling fine today with no concerning complaints except for mild fatigue. He has an episode of significant fatigue and lack of appetite after eating lunch with his high school classmate reunion. This is resolved on its own after 10 days. The patient denied having any current chest pain but has shortness of breath with exertion with no cough or hemoptysis. He denied having any fever or chills. He has no nausea, vomiting, diarrhea or constipation. He denied having any headache or visual changes. He had repeat CT scan of the chest performed recently and he is here for evaluation and discussion of his discuss results.  MEDICAL HISTORY: Past Medical History:  Diagnosis Date  . Cataract    left  . COLONIC POLYPS, HX OF 04/05/2007   ONE ADENOMATOUS POLYP AND ONE HYPERPLASTIC POLYP  . DEPRESSION 03/04/2007  . DERMATITIS, SEBORRHEIC 07/19/2007  . FIBRILLATION, ATRIAL 03/04/2007  . Hearing aid worn    B/L  . HYPERLIPIDEMIA 03/04/2007  . HYPERTENSION 03/04/2007  . Kidney stones   . Lung  cancer ([facility]) dx'd 02/2019   non small cell lung ca  . Lung mass    mediastinal adenopathy    ALLERGIES:  is allergic to hydrocodone.  MEDICATIONS:  Current Outpatient Medications  Medication Sig Dispense Refill  . amLODipine (NORVASC) 5 MG tablet TAKE 1 TABLET BY MOUTH  DAILY 90 tablet 3  . atenolol (TENORMIN) 50 MG tablet Take 1 tablet (50 mg total) by mouth 2 (two) times daily. 180 tablet 3  . clobetasol (TEMOVATE) 0.05 % external solution Apply 1 application topically 2 (two) times daily. For seborrheic dermatitis- 7 days maximum (Patient taking differently: Apply 1 application topically 2 (two) times daily as needed (scalp itch). ) 50 mL 0  . Multiple Vitamin (MULTIVITAMIN WITH MINERALS) TABS tablet Take 1 tablet by mouth daily.    . predniSONE (DELTASONE) 10 MG tablet Take 4 tablets (40 mg total) by mouth daily with breakfast for 7 days, THEN 3 tablets (30 mg total) daily with breakfast for 7 days, THEN 2 tablets (20 mg total) daily with breakfast for 7 days, THEN 1 tablet (10 mg total) daily with breakfast for 7 days. 50 tablet 0  . rivaroxaban (XARELTO) 20 MG TABS tablet Take 20 mg by mouth daily.    Marland Kitchen triamcinolone cream (KENALOG) 0.5 % Apply topically 2 (two) times daily as needed. (Patient taking differently: Apply 1 application topically 2 (two) times daily as needed (rash). ) 30 g 1  . levofloxacin (LEVAQUIN) 500 MG tablet Take 1 tablet (500 mg total) by mouth daily. (Patient not taking: Reported on 12/25/2019) 10 tablet 0  No current facility-administered medications for this visit.    SURGICAL HISTORY:  Past Surgical History:  Procedure Laterality Date  . CATARACT EXTRACTION W/ INTRAOCULAR LENS IMPLANT     right  . COLONOSCOPY    . IR IMAGING GUIDED PORT INSERTION  01/31/2019  . RADIOFREQUENCY ABLATION     x2 for fib, on 2nd attempt switched form a flutter ot a fib.   Marland Kitchen ROTATOR CUFF REPAIR Right   . TONSILLECTOMY    . VIDEO BRONCHOSCOPY WITH ENDOBRONCHIAL ULTRASOUND  Left 12/30/2018   Procedure: VIDEO BRONCHOSCOPY WITH ENDOBRONCHIAL ULTRASOUND WITH FLUOROSCOPY;  Surgeon: Laurin Coder, MD;  Location: [facility];  Service: Thoracic;  Laterality: Left;  . WISDOM TOOTH EXTRACTION      REVIEW OF SYSTEMS:  A comprehensive review of systems was negative except for: Constitutional: positive for fatigue Respiratory: positive for dyspnea on exertion   PHYSICAL EXAMINATION: General appearance: alert, cooperative, fatigued and no distress Head: Normocephalic, without obvious abnormality, atraumatic Neck: no adenopathy, no JVD, supple, symmetrical, trachea midline and thyroid not enlarged, symmetric, no tenderness/mass/nodules Lymph nodes: Cervical, supraclavicular, and axillary nodes normal. Resp: clear to auscultation bilaterally Back: symmetric, no curvature. ROM normal. No CVA tenderness. Cardio: regular rate and rhythm, S1, S2 normal, no murmur, click, rub or gallop GI: soft, non-tender; bowel sounds normal; no masses,  no organomegaly Extremities: extremities normal, atraumatic, no cyanosis or edema  ECOG PERFORMANCE STATUS: 1 - Symptomatic but completely ambulatory  Blood pressure 121/82, pulse 81, temperature 98.5 F (36.9 C), temperature source Tympanic, resp. rate 18, height 5\' 10"  (1.778 m), weight 197 lb 8 oz (89.6 kg), SpO2 98 %.  LABORATORY DATA: Lab Results  Component Value Date   WBC 11.4 (H) 01/05/2020   HGB 15.2 01/05/2020   HCT 46.8 01/05/2020   MCV 95.3 01/05/2020   PLT 149 (L) 01/05/2020      Chemistry      Component Value Date/Time   NA 137 01/05/2020 1408   K 5.8 (H) 01/05/2020 1408   CL 105 01/05/2020 1408   CO2 28 01/05/2020 1408   BUN 20 01/05/2020 1408   CREATININE 0.93 01/05/2020 1408      Component Value Date/Time   CALCIUM 9.4 01/05/2020 1408   ALKPHOS 69 01/05/2020 1408   AST 30 01/05/2020 1408   ALT 47 (H) 01/05/2020 1408   BILITOT 0.6 01/05/2020 1408       RADIOGRAPHIC STUDIES: CT Chest W  Contrast  Result Date: 01/06/2020 CLINICAL DATA:  Follow-up squamous cell lung cancer. EXAM: CT CHEST WITH CONTRAST TECHNIQUE: Multidetector CT imaging of the chest was performed during intravenous contrast administration. CONTRAST:  4mL OMNIPAQUE IOHEXOL 300 MG/ML  SOLN COMPARISON:  Chest CT December 14, 2019. FINDINGS: Cardiovascular: Normal heart size. Trace pericardial effusion. Thoracic aortic vascular calcifications. Mediastinum/Nodes: No enlarged axillary, mediastinal or hilar lymphadenopathy. Normal appearance of the esophagus. Lungs/Pleura: Central airways are patent. When compared to prior examination there has been interval improvement in previously visualized bilateral ground-glass opacities. Similar to mild interval decrease in size of spiculated right lower lobe nodule measuring 1.6 x 1.8 cm (image 107; series 5). Interval decrease in size of now small right pleural effusion. Upper Abdomen: No acute process. Musculoskeletal: Thoracic spine degenerative changes. No aggressive or acute appearing osseous lesions. IMPRESSION: 1. Similar to mild interval decrease in size of spiculated right lower lobe nodule. 2. Interval improvement in previously visualized bilateral ground-glass opacities. 3. Interval decrease in size of now small right pleural effusion. 4. Aortic atherosclerosis. Electronically  Signed   By: Lovey Newcomer M.D.   On: 01/06/2020 17:31   CT Angio Chest W/Cm &/Or Wo Cm  Result Date: 12/14/2019 CLINICAL DATA:  PE suspected, lung cancer, new onset shortness of breath and fatigue, abnormal chest radiograph EXAM: CT ANGIOGRAPHY CHEST WITH CONTRAST TECHNIQUE: Multidetector CT imaging of the chest was performed using the standard protocol during bolus administration of intravenous contrast. Multiplanar CT image reconstructions and MIPs were obtained to evaluate the vascular anatomy. CONTRAST:  41mL OMNIPAQUE IOHEXOL 350 MG/ML SOLN COMPARISON:  Chest radiograph, 12/07/2019, CT chest, 10/04/2019  FINDINGS: Cardiovascular: Satisfactory opacification of the pulmonary arteries to the segmental level. No evidence of pulmonary embolism. Cardiomegaly. Left coronary artery calcifications. Unchanged small pericardial effusion. Scattered aortic atherosclerosis. Mediastinum/Nodes: No enlarged mediastinal, hilar, or axillary lymph nodes. Thyroid gland, trachea, and esophagus demonstrate no significant findings. Lungs/Pleura: No significant change in post treatment appearance of the right lower lobe with fibrotic consolidation and bronchiectasis. Unchanged small right pleural effusion. Mild, diffuse bilateral bronchial wall thickening. There is new, extensive bilateral ground-glass and heterogeneous airspace opacity. Upper Abdomen: No acute abnormality. Musculoskeletal: No chest wall abnormality. No acute or significant osseous findings. Review of the MIP images confirms the above findings. IMPRESSION: 1. Negative examination for pulmonary embolism. 2. New, extensive bilateral ground-glass and heterogeneous airspace opacity, consistent with multifocal infection, including COVID-19 airspace disease if clinically suspected. Inflammatory etiologies such as drug toxicity are a differential consideration. 3. No significant change in post treatment appearance of the right lower lobe with fibrotic consolidation and bronchiectasis. Unchanged small right pleural effusion. 4. Mild, diffuse bilateral bronchial wall thickening, consistent with nonspecific infectious or inflammatory bronchitis. 5. Cardiomegaly and coronary artery disease. 6. Unchanged small pericardial effusion. Electronically Signed   By: Eddie Candle M.D.   On: 12/14/2019 18:27   ECHOCARDIOGRAM COMPLETE  Result Date: 01/03/2020    ECHOCARDIOGRAM REPORT   Patient Name:   [facility] Date of Exam: 01/03/2020 Medical Rec #:  [id number]            Height:       70.0 in Accession #:    [id number]           Weight:       197.0 lb Date of Birth:  1940-09-27              BSA:          2.074 m Patient Age:    49 years             BP:           102/64 mmHg Patient Gender: M                    HR:           84 bpm. Exam Location:  [facility] Procedure: 2D Echo, Cardiac Doppler and Color Doppler Indications:    R06.02  History:        Patient has no prior history of Echocardiogram examinations.                 Signs/Symptoms:Shortness of Breath; Risk Factors:Hypertension                 and Former Smoker.  Sonographer:    Coralyn Helling RDCS Referring Phys: Bee  1. Left ventricular ejection fraction, by estimation, is 60 to 65%. The left ventricle has normal function. The left ventricle has no regional wall motion abnormalities. Left ventricular  diastolic function could not be evaluated.  2. Right ventricular systolic function is normal. The right ventricular size is normal. Tricuspid regurgitation signal is inadequate for assessing PA pressure.  3. Left atrial size was severely dilated.  4. Right atrial size was mildly dilated.  5. The mitral valve is degenerative. Trivial mitral valve regurgitation. No evidence of mitral stenosis.  6. The aortic valve was not well visualized. Aortic valve regurgitation is not visualized. No aortic stenosis is present.  7. The inferior vena cava is normal in size with greater than 50% respiratory variability, suggesting right atrial pressure of 3 mmHg. FINDINGS  Left Ventricle: Left ventricular ejection fraction, by estimation, is 60 to 65%. The left ventricle has normal function. The left ventricle has no regional wall motion abnormalities. The left ventricular internal cavity size was normal in size. There is  no left ventricular hypertrophy. Left ventricular diastolic function could not be evaluated due to atrial fibrillation. Left ventricular diastolic function could not be evaluated. Right Ventricle: The right ventricular size is normal. No increase in right ventricular wall thickness. Right ventricular  systolic function is normal. Tricuspid regurgitation signal is inadequate for assessing PA pressure. Left Atrium: Left atrial size was severely dilated. Right Atrium: Right atrial size was mildly dilated. Pericardium: Trivial pericardial effusion is present. Mitral Valve: The mitral valve is degenerative in appearance. There is mild calcification of the anterior and posterior mitral valve leaflet(s). Mild mitral annular calcification. Trivial mitral valve regurgitation. No evidence of mitral valve stenosis. Tricuspid Valve: The tricuspid valve is grossly normal. Tricuspid valve regurgitation is not demonstrated. No evidence of tricuspid stenosis. Aortic Valve: The aortic valve was not well visualized. Aortic valve regurgitation is not visualized. No aortic stenosis is present. Pulmonic Valve: The pulmonic valve was grossly normal. Pulmonic valve regurgitation is not visualized. No evidence of pulmonic stenosis. Aorta: The aortic root and ascending aorta are structurally normal, with no evidence of dilitation. Venous: The inferior vena cava is normal in size with greater than 50% respiratory variability, suggesting right atrial pressure of 3 mmHg. IAS/Shunts: The atrial septum is grossly normal. EKG: Rhythm strip during this exam demostrated atrial fibrillation.  LEFT VENTRICLE PLAX 2D LVIDd:         4.50 cm  Diastology LVIDs:         3.50 cm  LV e' medial:    8.46 cm/s LV PW:         1.20 cm  LV E/e' medial:  14.2 LV IVS:        1.20 cm  LV e' lateral:   9.80 cm/s LVOT diam:     2.40 cm  LV E/e' lateral: 12.3 LV SV:         51 LV SV Index:   25 LVOT Area:     4.52 cm  RIGHT VENTRICLE            IVC RV S prime:     8.99 cm/s  IVC diam: 1.20 cm TAPSE (M-mode): 1.3 cm LEFT ATRIUM              Index       RIGHT ATRIUM           Index LA diam:        3.60 cm  1.74 cm/m  RA Pressure: 3.00 mmHg LA Vol (A2C):   109.0 ml 52.56 ml/m RA Area:     13.70 cm LA Vol (A4C):   79.9 ml  38.53 ml/m RA Volume:  30.70 ml  14.80  ml/m LA Biplane Vol: 95.4 ml  46.00 ml/m  AORTIC VALVE LVOT Vmax:   53.92 cm/s LVOT Vmean:  40.040 cm/s LVOT VTI:    0.114 m  AORTA Ao Root diam: 4.00 cm Ao Asc diam:  4.10 cm MV E velocity: 120.20 cm/s  TRICUSPID VALVE                             Estimated RAP:  3.00 mmHg                              SHUNTS                             Systemic VTI:  0.11 m                             Systemic Diam: 2.40 cm Eleonore Chiquito MD Electronically signed by Eleonore Chiquito MD Signature Date/Time: 01/03/2020/12:16:13 PM    Final     ASSESSMENT AND PLAN: This is a very pleasant 79 years old white male with a stage IIIb non-small cell lung cancer, squamous cell carcinoma diagnosed in October 2020 status post course of concurrent chemoradiation with 5 cycles of chemotherapy with carboplatin and paclitaxel. The patient tolerated his treatment well except for pancytopenia. The patient had partial response after his treatment. He is currently undergoing consolidation treatment with immunotherapy with Imfinzi 1500 mg IV every 4 weeks status post 5  cycles.   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 His treatment was discontinued because of the persistent and recurrent immunotherapy mediated pneumonitis. The patient started on a tapered dose of prednisone and over the last few weeks he felt much better with more energy and appetite as well as less cough and shortness of breath. He is currently on observation and feeling fine with no concerning complaints except for mild shortness of breath and fatigue. He had repeat CT scan of the chest performed recently. I personally and independently reviewed the scans and discussed the results with the patient today. His scan showed no concerning findings for disease progression and he continues to have mild improvement of his disease. I recommended for him to continue on observation with repeat  CT scan of the chest in 4 months. For the hyperkalemia, we will repeat his basic metabolic panel today for evaluation of his condition. The patient was advised to call immediately if he has any concerning symptoms in the interval. The patient voices understanding of current disease status and treatment options and is in agreement with the current care plan.  All questions were answered. The patient knows to call the clinic with any problems, questions or concerns. We can certainly see the patient much sooner if necessary.  Disclaimer: This note was dictated with voice recognition software. Similar sounding words can inadvertently be transcribed and may not be corrected upon review.

## 2020-01-09 ENCOUNTER — Ambulatory Visit (HOSPITAL_COMMUNITY): Payer: Medicare Other | Admitting: Anesthesiology

## 2020-01-09 ENCOUNTER — Encounter (HOSPITAL_COMMUNITY): Payer: Self-pay | Admitting: Pulmonary Disease

## 2020-01-09 ENCOUNTER — Ambulatory Visit (HOSPITAL_COMMUNITY): Payer: Medicare Other

## 2020-01-09 ENCOUNTER — Encounter (HOSPITAL_COMMUNITY)
Admission: RE | Disposition: A | Discharge status: Home / Self Care | Payer: Self-pay | Source: Home / Self Care | Attending: Pulmonary Disease

## 2020-01-09 ENCOUNTER — Ambulatory Visit: Payer: Medicare Other | Admitting: Pulmonary Disease

## 2020-01-09 ENCOUNTER — Encounter (HOSPITAL_COMMUNITY): Payer: Self-pay

## 2020-01-09 ENCOUNTER — Ambulatory Visit (HOSPITAL_COMMUNITY)
Admission: RE | Admit: 2020-01-09 | Discharge: 2020-01-09 | Disposition: A | Discharge status: Home / Self Care | Payer: Medicare Other | Attending: Pulmonary Disease | Admitting: Pulmonary Disease

## 2020-01-09 ENCOUNTER — Ambulatory Visit (HOSPITAL_COMMUNITY): Admit: 2020-01-09 | Payer: Medicare Other | Admitting: Pulmonary Disease

## 2020-01-09 DIAGNOSIS — R918 Other nonspecific abnormal finding of lung field: Secondary | ICD-10-CM | POA: Insufficient documentation

## 2020-01-09 DIAGNOSIS — Z9221 Personal history of antineoplastic chemotherapy: Secondary | ICD-10-CM | POA: Insufficient documentation

## 2020-01-09 DIAGNOSIS — Z7952 Long term (current) use of systemic steroids: Secondary | ICD-10-CM | POA: Diagnosis not present

## 2020-01-09 DIAGNOSIS — I4891 Unspecified atrial fibrillation: Secondary | ICD-10-CM | POA: Insufficient documentation

## 2020-01-09 DIAGNOSIS — E785 Hyperlipidemia, unspecified: Secondary | ICD-10-CM | POA: Diagnosis not present

## 2020-01-09 DIAGNOSIS — C3490 Malignant neoplasm of unspecified part of unspecified bronchus or lung: Secondary | ICD-10-CM | POA: Diagnosis not present

## 2020-01-09 DIAGNOSIS — Z8701 Personal history of pneumonia (recurrent): Secondary | ICD-10-CM | POA: Diagnosis not present

## 2020-01-09 DIAGNOSIS — Z7901 Long term (current) use of anticoagulants: Secondary | ICD-10-CM | POA: Diagnosis not present

## 2020-01-09 DIAGNOSIS — C3431 Malignant neoplasm of lower lobe, right bronchus or lung: Secondary | ICD-10-CM | POA: Insufficient documentation

## 2020-01-09 DIAGNOSIS — Z87891 Personal history of nicotine dependence: Secondary | ICD-10-CM | POA: Diagnosis not present

## 2020-01-09 DIAGNOSIS — Z6828 Body mass index (BMI) 28.0-28.9, adult: Secondary | ICD-10-CM | POA: Diagnosis not present

## 2020-01-09 DIAGNOSIS — Z9889 Other specified postprocedural states: Secondary | ICD-10-CM

## 2020-01-09 DIAGNOSIS — R911 Solitary pulmonary nodule: Secondary | ICD-10-CM

## 2020-01-09 DIAGNOSIS — C3491 Malignant neoplasm of unspecified part of right bronchus or lung: Secondary | ICD-10-CM | POA: Diagnosis not present

## 2020-01-09 DIAGNOSIS — I10 Essential (primary) hypertension: Secondary | ICD-10-CM | POA: Insufficient documentation

## 2020-01-09 HISTORY — PX: VIDEO BRONCHOSCOPY: SHX5072

## 2020-01-09 HISTORY — PX: BRONCHIAL WASHINGS: SHX5105

## 2020-01-09 HISTORY — PX: BIOPSY: SHX5522

## 2020-01-09 LAB — BODY FLUID CELL COUNT WITH DIFFERENTIAL
Eos, Fluid: 1 %
Lymphs, Fluid: 15 %
Monocyte-Macrophage-Serous Fluid: 65 % (ref 50–90)
Neutrophil Count, Fluid: 19 % (ref 0–25)
Total Nucleated Cell Count, Fluid: 350 cu mm (ref 0–1000)

## 2020-01-09 SURGERY — BRONCHOSCOPY, WITH FLUOROSCOPY
Anesthesia: General

## 2020-01-09 MED ORDER — FENTANYL CITRATE (PF) 100 MCG/2ML IJ SOLN
INTRAMUSCULAR | Status: DC | PRN
Start: 2020-01-09 — End: 2020-01-09
  Administered 2020-01-09: 25 ug via INTRAVENOUS
  Administered 2020-01-09: 75 ug via INTRAVENOUS

## 2020-01-09 MED ORDER — ROCURONIUM BROMIDE 10 MG/ML (PF) SYRINGE
PREFILLED_SYRINGE | INTRAVENOUS | Status: DC | PRN
  Administered 2020-01-09: 60 mg via INTRAVENOUS

## 2020-01-09 MED ORDER — PROPOFOL 10 MG/ML IV BOLUS
INTRAVENOUS | Status: AC
  Filled 2020-01-09: qty 40

## 2020-01-09 MED ORDER — SUGAMMADEX SODIUM 500 MG/5ML IV SOLN
INTRAVENOUS | Status: DC | PRN
  Administered 2020-01-09: 400 mg via INTRAVENOUS

## 2020-01-09 MED ORDER — DEXAMETHASONE SODIUM PHOSPHATE 10 MG/ML IJ SOLN
INTRAMUSCULAR | Status: DC | PRN
  Administered 2020-01-09: 8 mg via INTRAVENOUS

## 2020-01-09 MED ORDER — PROPOFOL 10 MG/ML IV BOLUS
INTRAVENOUS | Status: DC | PRN
  Administered 2020-01-09: 100 mg via INTRAVENOUS

## 2020-01-09 MED ORDER — ONDANSETRON HCL 4 MG/2ML IJ SOLN
INTRAMUSCULAR | Status: DC | PRN
  Administered 2020-01-09: 4 mg via INTRAVENOUS

## 2020-01-09 MED ORDER — LACTATED RINGERS IV SOLN: INTRAVENOUS | Status: DC

## 2020-01-09 MED ORDER — LIDOCAINE 2% (20 MG/ML) 5 ML SYRINGE
INTRAMUSCULAR | Status: DC | PRN
  Administered 2020-01-09: 80 mg via INTRAVENOUS

## 2020-01-09 MED ORDER — FENTANYL CITRATE (PF) 100 MCG/2ML IJ SOLN
INTRAMUSCULAR | Status: AC
  Filled 2020-01-09: qty 2

## 2020-01-09 NOTE — Discharge Instructions (Signed)
Flexible Bronchoscopy, Care After This sheet gives you information about how to care for yourself after your test. Your doctor may also give you more specific instructions. If you have problems or questions, contact your doctor. Follow these instructions at home: Eating and drinking  Do not eat or drink anything (not even water) for 2 hours after your test, or until your numbing medicine (local anesthetic) wears off.  When your numbness is gone and your cough and gag reflexes have come back, you may: ? Eat only soft foods. ? Slowly drink liquids.  The day after the test, go back to your normal diet. Driving  Do not drive for 24 hours if you were given a medicine to help you relax (sedative).  Do not drive or use heavy machinery while taking prescription pain medicine. General instructions   Take over-the-counter and prescription medicines only as told by your doctor.  Return to your normal activities as told. Ask what activities are safe for you.  Do not use any products that have nicotine or tobacco in them. This includes cigarettes and e-cigarettes. If you need help quitting, ask your doctor.  Keep all follow-up visits as told by your doctor. This is important. It is very important if you had a tissue sample (biopsy) taken. Get help right away if:  You have shortness of breath that gets worse.  You get light-headed.  You feel like you are going to pass out (faint).  You have chest pain.  You cough up: ? More than a little blood. ? More blood than before. Summary  Do not eat or drink anything (not even water) for 2 hours after your test, or until your numbing medicine wears off.  Do not use cigarettes. Do not use e-cigarettes.  Get help right away if you have chest pain. This information is not intended to replace advice given to you by your health care provider. Make sure you discuss any questions you have with your health care provider. Document Revised: 02/26/2017  Document Reviewed: 04/03/2016 Elsevier Patient Education  [place].   Activity as tolerated  Call with concerns  Mild fever is normal- may take OTC  Tylenol Mild bloody phlegm is normal following taking lung biopsies  Resume Xarelto today, anytime after 1500hrs

## 2020-01-09 NOTE — Anesthesia Procedure Notes (Signed)
Procedure Name: Intubation Date/Time: 01/09/2020 7:54 AM Performed by: Lavina Hamman, CRNA Pre-anesthesia Checklist: Patient identified, Emergency Drugs available, Suction available, Patient being monitored and Timeout performed Patient Re-evaluated:Patient Re-evaluated prior to induction Oxygen Delivery Method: Circle system utilized Preoxygenation: Pre-oxygenation with 100% oxygen Induction Type: IV induction Ventilation: Mask ventilation without difficulty Laryngoscope Size: Mac and 4 Grade View: Grade I Tube type: Oral Tube size: 9.0 mm Number of attempts: 1 Airway Equipment and Method: Stylet Placement Confirmation: ETT inserted through vocal cords under direct vision,  positive ETCO2,  CO2 detector and breath sounds checked- equal and bilateral Secured at: 23 cm Tube secured with: Tape Dental Injury: Teeth and Oropharynx as per pre-operative assessment  Comments: ATOI

## 2020-01-09 NOTE — Interval H&P Note (Signed)
History and Physical Interval Note: Feels relatively well today Still has some fatigue but generally improving from previous  Cough is better, shortness of breath is better Ongoing fatigue is better as well Denies any chest pains or chest discomfort this morning  Physical examination unremarkable  Lab data reviewed  CT scan of the chest reviewed showing improvement in groundglass changes  Bronchoscopy procedure discussed  Agreeable to proceed with bronchoscopy  01/09/2020 7:35 AM   Martin Bailey  has presented today for surgery, with the diagnosis of abnormal ct.  The various methods of treatment have been discussed with the patient and family. After consideration of risks, benefits and other options for treatment, the patient has consented to  Procedure(s): VIDEO BRONCHOSCOPY WITH FLUORO (N/A) as a surgical intervention.  The patient's history has been reviewed, patient examined, no change in status, stable for surgery.  I have reviewed the patient's chart and labs.  Questions were answered to the patient's satisfaction.     Martin Bailey

## 2020-01-09 NOTE — Transfer of Care (Signed)
Immediate Anesthesia Transfer of Care Note  Patient: Martin Bailey  Procedure(s) Performed: Procedure(s): VIDEO BRONCHOSCOPY WITH FLUORO (N/A) BRONCHIAL WASHINGS BIOPSY  Patient Location: PACU  Anesthesia Type:General  Level of Consciousness:  sedated, patient cooperative and responds to stimulation  Airway & Oxygen Therapy:Patient Spontanous Breathing and Patient connected to face mask oxgen  Post-op Assessment:  Report given to PACU RN and Post -op Vital signs reviewed and stable  Post vital signs:  Reviewed and stable  Last Vitals:  Vitals:   01/09/20 0651  BP: 138/90  Pulse: 95  Resp: 20  Temp: 37.3 C  SpO2: 48%    Complications: No apparent anesthesia complications

## 2020-01-09 NOTE — Op Note (Signed)
[facility] Cardiopulmonary Patient Name: Martin Bailey Procedure Date: 01/09/2020 MRN: [id number] Attending MD: Laurin Coder MD, MD Date of Birth: May 09, 1940 CSN: [id number] Age: 79 Admit Type: Outpatient Ethnicity: Not Hispanic or Latino Procedure:             Bronchoscopy Indications:           Immune compromised due to chemotherapy, Infiltrate Providers:             Love Chowning A. Ander Slade MD, MD, Grace Isaac, RN, Cherylynn Ridges, Technician Referring MD:           Medicines:             General Anesthesia, See the Anesthesia note for                         documentation of the administered medications Complications:         No immediate complications Estimated Blood Loss:  Estimated blood loss was minimal. Procedure:      Pre-Anesthesia Assessment:      - Immediately prior to administration of medications, the patient was       re-assessed for adequacy to receive sedatives.      - A History and Physical has been performed. The patient's medications,       allergies and sensitivities have been reviewed.      - Respiratory Examination: clear to auscultation.      After obtaining informed consent, the bronchoscope was passed under       direct vision. Throughout the procedure, the patient's blood pressure,       pulse, and oxygen saturations were monitored continuously. the [id number]       ([id number]) Olympus therapeutic bronchoscope was introduced through the       mouth, via the endotracheal tube (the patient was intubated for the       procedure) and advanced to the tracheobronchial tree of both lungs. The       procedure was accomplished without difficulty. The patient tolerated the       procedure well. The patient tolerated the procedure well. The procedure       was accomplished without difficulty. Findings:      The nasopharynx/oropharynx appears normal. The larynx appears normal.       The vocal cords appear normal. The subglottic  space is normal. The       trachea is of normal caliber. The carina is sharp. The tracheobronchial       tree of the left lung was examined to at least the first subsegmental       level. Bronchial mucosa and anatomy in the left lung are normal; there       are no endobronchial lesions, and no secretions.      The nasopharynx/oropharynx appears normal. The larynx appears normal.       The vocal cords appear normal. The subglottic space is normal. The       trachea is of normal caliber. The carina is sharp. The tracheobronchial       tree of the right lung was examined to at least the first subsegmental       level. Bronchial mucosa and anatomy in the right lung are normal; there       are no endobronchial lesions, and no  secretions. BAL was performed in       the RLL lateral basal segment (B9) of the lung and sent for cell count       and differential, routine cytology and bacterial, AFB and fungal       analysis. 120 mL of fluid were instilled. 50 mL were returned. The       return was cellular. There were no mucoid plugs in the return fluid.       Transbronchial biopsies were performed in the lateral basal segment of       the right lower lobe using forceps and sent for histopathology       examination. The procedure was guided by fluoroscopy. Transbronchial       biopsy technique was selected because the sampling site was not visible       endoscopically. Six biopsy passes were performed. Six biopsy samples       were obtained. Impression:      - Immune compromised due to chemotherapy      - Infiltrate      - The airway examination of the left lung was normal.      - The airway examination of the right lung was normal.      - Bronchoalveolar lavage was performed.      - Transbronchial lung biopsies were performed.      - The airway examination was normal. Moderate Sedation:      An independent trained observer was present and continuously monitored       the  patient. Recommendation:      - Await BAL, biopsy and cytology results. Procedure Code(s):      --- Professional ---      [phone], Bronchoscopy, rigid or flexible, including fluoroscopic guidance,       when performed; diagnostic, with cell washing, when performed (separate       procedure) Diagnosis Code(s):      --- Professional ---      K91.791, Other long term (current) drug therapy      R91.8, Other nonspecific abnormal finding of lung field CPT copyright 2019 American Medical Association. All rights reserved. The codes documented in this report are preliminary and upon coder review may  be revised to meet current compliance requirements. Sherrilyn Rist, MD Laurin Coder MD, MD 01/09/2020 8:13:04 AM This report has been signed electronically. Number of Addenda: 0 Scope In: Scope Out:

## 2020-01-09 NOTE — Anesthesia Postprocedure Evaluation (Signed)
Anesthesia Post Note  Patient: Martin Bailey  Procedure(s) Performed: VIDEO BRONCHOSCOPY WITH FLUORO (N/A ) BRONCHIAL WASHINGS BIOPSY     Patient location during evaluation: PACU Anesthesia Type: General Level of consciousness: sedated Pain management: pain level controlled Vital Signs Assessment: post-procedure vital signs reviewed and stable Respiratory status: spontaneous breathing and respiratory function stable Cardiovascular status: stable Postop Assessment: no apparent nausea or vomiting Anesthetic complications: no   No complications documented.  Last Vitals:  Vitals:   01/09/20 0830 01/09/20 0840  BP: 106/69 108/78  Pulse: (!) 162 94  Resp: (!) 21 (!) 23  Temp:    SpO2: 99% 98%    Last Pain:  Vitals:   01/09/20 0840  TempSrc:   PainSc: 0-No pain                 Sefora Tietje DANIEL

## 2020-01-09 NOTE — Progress Notes (Signed)
CXR post bronchoscopy reviewed- NO pneumothorax   Stable to be discharged home

## 2020-01-10 ENCOUNTER — Encounter (HOSPITAL_COMMUNITY): Payer: Self-pay | Admitting: Pulmonary Disease

## 2020-01-10 ENCOUNTER — Telehealth: Payer: Self-pay | Admitting: Medical Oncology

## 2020-01-10 LAB — SURGICAL PATHOLOGY

## 2020-01-10 LAB — CYTOLOGY - NON PAP

## 2020-01-10 NOTE — Telephone Encounter (Signed)
LVM that our lab did not collect PT and PTT -blood was too old.

## 2020-01-11 LAB — ACID FAST SMEAR (AFB, MYCOBACTERIA): Acid Fast Smear: NEGATIVE

## 2020-01-11 LAB — CULTURE, RESPIRATORY W GRAM STAIN: Culture: NO GROWTH

## 2020-01-14 LAB — ANAEROBIC CULTURE

## 2020-01-15 ENCOUNTER — Telehealth: Payer: Self-pay | Admitting: Pulmonary Disease

## 2020-01-15 NOTE — Telephone Encounter (Signed)
Called to update patient about bronchoscopy findings- benign results

## 2020-01-17 ENCOUNTER — Ambulatory Visit: Payer: Medicare Other | Admitting: Pulmonary Disease

## 2020-01-17 ENCOUNTER — Encounter: Payer: Self-pay | Admitting: Pulmonary Disease

## 2020-01-17 ENCOUNTER — Other Ambulatory Visit: Payer: Self-pay

## 2020-01-17 VITALS — BP 118/78 | HR 80 | Temp 97.2°F | Ht 70.0 in | Wt 196.4 lb

## 2020-01-17 DIAGNOSIS — R5382 Chronic fatigue, unspecified: Secondary | ICD-10-CM | POA: Diagnosis not present

## 2020-01-17 NOTE — Patient Instructions (Signed)
Fatigue  History of lung cancer  Recent bronchoscopy sampling was within normal limits  Continue to follow-up with oncology  I will see you tentatively in about 6 months May be rescheduled if you are feeling great at the time and you are continuing to follow-up with oncology on a regular basis

## 2020-01-17 NOTE — Progress Notes (Signed)
Martin Bailey    [id number]    07/22/1940  Primary Care Physician:Hunter, [name], MD  Referring Physician: [name], MD [place],  [place] [address]  Chief complaint:   Patient with a history of metastatic lung cancer He is in for follow-up  HPI: Recent bronchoscopy full abnormal CT scan of the chest showing groundglass changes, interstitial process with concerns for lymphangitic spread of cancer and interstitial pneumonitis He was started on steroids CT scan findings of improved  He still has persistent fatigue but this is overall improving as well.  Had a chest x-ray performed in June 2019 showing haziness at the right base He was treated for an infection A repeat chest x-ray revealed persistence of the lesion leading to the CT scan of the chest been performed Diagnosed with cancer with bronchoscopy-squamous cell cancer-had chemoradiation treatment Interstitial pneumonitis secondary to immunotherapy was considered for his recent CT scan changes  Currently has no cough or discomfort Persistent complaints of fatigue  Appetite is good No night sweats  He has a hobby doing woodworking which is done for 40 years Quit smoking about 40 years ago about 20-pack-year smoking history  Father had lung cancer-he smoked  Has no chest pains or chest discomfort  He has noticed that his cough does get better whenever is not at home Recently evaluated for possible reflux by ENT-reflux medications did not help symptoms  Outpatient Encounter Medications as of 01/17/2020  Medication Sig  . atenolol (TENORMIN) 50 MG tablet Take 1 tablet (50 mg total) by mouth 2 (two) times daily.  . clobetasol (TEMOVATE) 0.05 % external solution Apply 1 application topically 2 (two) times daily. For seborrheic dermatitis- 7 days maximum (Patient taking differently: Apply 1 application topically 2 (two) times daily as needed (scalp itch). )  . Multiple Vitamin  (MULTIVITAMIN WITH MINERALS) TABS tablet Take 1 tablet by mouth daily.  . rivaroxaban (XARELTO) 20 MG TABS tablet Take 20 mg by mouth daily.  Marland Kitchen triamcinolone cream (KENALOG) 0.5 % Apply topically 2 (two) times daily as needed. (Patient taking differently: Apply 1 application topically 2 (two) times daily as needed (rash). )  . amLODipine (NORVASC) 5 MG tablet TAKE 1 TABLET BY MOUTH  DAILY  . levofloxacin (LEVAQUIN) 500 MG tablet Take 1 tablet (500 mg total) by mouth daily. (Patient not taking: Reported on 12/25/2019)  . predniSONE (DELTASONE) 10 MG tablet Take 4 tablets (40 mg total) by mouth daily with breakfast for 7 days, THEN 3 tablets (30 mg total) daily with breakfast for 7 days, THEN 2 tablets (20 mg total) daily with breakfast for 7 days, THEN 1 tablet (10 mg total) daily with breakfast for 7 days.   No facility-administered encounter medications on file as of 01/17/2020.    Allergies as of 01/17/2020 - Review Complete 01/17/2020  Allergen Reaction Noted  . Hydrocodone Other (See Comments) 09/18/2019    Past Medical History:  Diagnosis Date  . Cataract    left  . COLONIC POLYPS, HX OF 04/05/2007   ONE ADENOMATOUS POLYP AND ONE HYPERPLASTIC POLYP  . DEPRESSION 03/04/2007  . DERMATITIS, SEBORRHEIC 07/19/2007  . FIBRILLATION, ATRIAL 03/04/2007  . Hearing aid worn    B/L  . HYPERLIPIDEMIA 03/04/2007  . HYPERTENSION 03/04/2007  . Kidney stones   . Lung cancer ([place]) dx'd 02/2019   non small cell lung ca  . Lung mass    mediastinal adenopathy    Past Surgical  History:  Procedure Laterality Date  . BIOPSY  01/09/2020   Procedure: BIOPSY;  Surgeon: Laurin Coder, MD;  Location: [facility];  Service: Endoscopy;;  . BRONCHIAL WASHINGS  01/09/2020   Procedure: BRONCHIAL WASHINGS;  Surgeon: Laurin Coder, MD;  Location: [facility];  Service: Endoscopy;;  . CATARACT EXTRACTION W/ INTRAOCULAR LENS IMPLANT     right  . COLONOSCOPY    . IR IMAGING GUIDED PORT INSERTION   01/31/2019  . RADIOFREQUENCY ABLATION     x2 for fib, on 2nd attempt switched form a flutter ot a fib.   Marland Kitchen ROTATOR CUFF REPAIR Right   . TONSILLECTOMY    . VIDEO BRONCHOSCOPY N/A 01/09/2020   Procedure: VIDEO BRONCHOSCOPY WITH FLUORO;  Surgeon: Laurin Coder, MD;  Location: [facility];  Service: Endoscopy;  Laterality: N/A;  . VIDEO BRONCHOSCOPY WITH ENDOBRONCHIAL ULTRASOUND Left 12/30/2018   Procedure: VIDEO BRONCHOSCOPY WITH ENDOBRONCHIAL ULTRASOUND WITH FLUOROSCOPY;  Surgeon: Laurin Coder, MD;  Location: [facility];  Service: Thoracic;  Laterality: Left;  . WISDOM TOOTH EXTRACTION      Family History  Problem Relation Age of Onset  . Osteoporosis Mother   . Dementia Mother   . Lung cancer Father        former smoker  . Colon cancer Neg Hx   . Stomach cancer Neg Hx   . Esophageal cancer Neg Hx   . Rectal cancer Neg Hx     Social History   Socioeconomic History  . Marital status: Widowed    Spouse name: Not on file  . Number of children: 1  . Years of education: Not on file  . Highest education level: Not on file  Occupational History  . Occupation: retired  Tobacco Use  . Smoking status: Former Smoker    Packs/day: 1.50    Years: 10.00    Pack years: 15.00    Types: Cigarettes    Quit date: 07/08/1977    Years since quitting: 42.5  . Smokeless tobacco: Never Used  Vaping Use  . Vaping Use: Never used  Substance and Sexual Activity  . Alcohol use: Yes    Alcohol/week: 42.0 standard drinks    Types: 42 Standard drinks or equivalent per week    Comment: 4 scotch's a day  . Drug use: No  . Sexual activity: Not on file  Other Topics Concern  . Not on file  Social History Narrative   Widower-wife died 92. 1 daughter. 2 grandkids. 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       Hobbies: golf, bridge, computer, sports, woodworking   Social  Determinants of Health   Financial Resource Strain:   . Difficulty of Paying Living Expenses: Not on file  Food Insecurity:   . Worried About Charity fundraiser in the Last Year: Not on file  . Ran Out of Food in the Last Year: Not on file  Transportation Needs:   . Lack of Transportation (Medical): Not on file  . Lack of Transportation (Non-Medical): Not on file  Physical Activity:   . Days of Exercise per Week: Not on file  . Minutes of Exercise per Session: Not on file  Stress:   . Feeling of Stress : Not on file  Social Connections:   . Frequency of Communication with Friends and Family: Not on file  . Frequency of  Social Gatherings with Friends and Family: Not on file  . Attends Religious Services: Not on file  . Active Member of Clubs or Organizations: Not on file  . Attends Archivist Meetings: Not on file  . Marital Status: Not on file  Intimate Partner Violence:   . Fear of Current or Ex-Partner: Not on file  . Emotionally Abused: Not on file  . Physically Abused: Not on file  . Sexually Abused: Not on file    Review of Systems  Constitutional: Positive for fatigue.  HENT: Negative.   Eyes: Negative.   Respiratory: Negative for shortness of breath.   Cardiovascular: Negative.   Gastrointestinal: Negative.   Endocrine: Negative.   Psychiatric/Behavioral: Negative for sleep disturbance.    Vitals:   01/17/20 1643  BP: 118/78  Pulse: 80  Temp: (!) 97.2 F (36.2 C)  SpO2: 98%     Physical Exam Constitutional:      Appearance: Normal appearance. He is well-developed.  HENT:     Head: Normocephalic and atraumatic.  Eyes:     General:        Right eye: No discharge.        Left eye: No discharge.  Neck:     Thyroid: No thyromegaly.     Trachea: No tracheal deviation.  Cardiovascular:     Rate and Rhythm: Normal rate and regular rhythm.  Pulmonary:     Effort: Pulmonary effort is normal. No respiratory distress.     Breath sounds: Normal  breath sounds. No wheezing or rales.  Musculoskeletal:     Cervical back: No rigidity or tenderness.  Neurological:     Mental Status: He is alert.  Psychiatric:        Mood and Affect: Mood normal.    Data Reviewed:  CT scan of the chest reviewed with the patient showing interstitial process which is better than the last CT he had  older CT scans also reviewed showing the Mass process in the right lower lobe   Assessment:  Lung mass -Squamous cell cancer staging with chemoradiation  Chronic fatigue -It is alleviated but not completely gone  Past history of smoking  Chronic cough -Resolved  Plan/Recommendations:  Clinical monitoring of symptoms  Graded exercise as tolerated  Continue follow-up with oncology  I will tentatively see him in about 6 months  Sherrilyn Rist MD [facility] 01/17/2020, 4:59 PM  CC: [name], MD

## 2020-01-22 ENCOUNTER — Encounter: Payer: Self-pay | Admitting: Family Medicine

## 2020-01-25 NOTE — Patient Instructions (Addendum)
Health Maintenance Due  Topic Date Due  . COLONOSCOPY Has not been done yet- holding off for now with respiratory issues 07/28/2018   We will call you within two weeks about your referral to cardiology. If you do not hear within 3 weeks, give [place] a call.   You preferred to hold off on oxygen for now- I will reach out to pulmonology for their opinion  Restart prednisone

## 2020-01-25 NOTE — Progress Notes (Signed)
Phone [phone] In person visit   Subjective:   Martin Bailey is a 79 y.o. year old very pleasant male patient who presents for/with See problem oriented charting Chief Complaint  Patient presents with  . Fatigue   This visit occurred during the SARS-CoV-2 public health emergency.  Safety protocols were in place, including screening questions prior to the visit, additional usage of staff PPE, and extensive cleaning of exam room while observing appropriate contact time as indicated for disinfecting solutions.   Past Medical History-  Patient Active Problem List   Diagnosis Date Noted  . Pneumonitis, interstitial ([place]) 06/29/2019    Priority: High  . Stage III squamous cell carcinoma of right lung ([place]) 01/06/2019    Priority: High  . Alcoholism ([place]) 06/21/2014    Priority: High  . Atrial fibrillation ([place]) 03/04/2007    Priority: High  . Elevated TSH 09/21/2014    Priority: Medium  . Seborrheic dermatitis 07/19/2007    Priority: Medium  . Hyperlipidemia 03/04/2007    Priority: Medium  . Major depression ([place]) 03/04/2007    Priority: Medium  . Essential hypertension 03/04/2007    Priority: Medium  . Former smoker 06/21/2014    Priority: Low  . Allergic rhinitis 04/06/2014    Priority: Low  . GERD (gastroesophageal reflux disease) 04/06/2014    Priority: Low  . Hx of adenomatous colonic polyps 03/05/2014    Priority: Low  . Chronic anticoagulation 03/05/2014    Priority: Low  . Testosterone deficiency 09/02/2010    Priority: Low  . Abnormal findings on diagnostic imaging of lung 12/15/2019  . Shortness of breath 12/15/2019  . Drug-induced pneumonitis 09/19/2019  . Encounter for antineoplastic immunotherapy 03/29/2019  . Chemotherapy-induced thrombocytopenia 02/20/2019  . Encounter for antineoplastic chemotherapy 01/06/2019  . Goals of care, counseling/discussion 01/06/2019  . Laryngopharyngeal reflux (LPR) 09/12/2018  . Cough 09/12/2018  .  Disorientation 02/10/2018  . Overweight 08/09/2017    Medications- reviewed and updated Current Outpatient Medications  Medication Sig Dispense Refill  . atenolol (TENORMIN) 50 MG tablet Take 1 tablet (50 mg total) by mouth 2 (two) times daily. 180 tablet 3  . clobetasol (TEMOVATE) 0.05 % external solution Apply 1 application topically 2 (two) times daily. For seborrheic dermatitis- 7 days maximum (Patient taking differently: Apply 1 application topically 2 (two) times daily as needed (scalp itch). ) 50 mL 0  . Multiple Vitamin (MULTIVITAMIN WITH MINERALS) TABS tablet Take 1 tablet by mouth daily.    . rivaroxaban (XARELTO) 20 MG TABS tablet Take 20 mg by mouth daily.    Marland Kitchen triamcinolone cream (KENALOG) 0.5 % Apply topically 2 (two) times daily as needed. (Patient taking differently: Apply 1 application topically 2 (two) times daily as needed (rash). ) 30 g 1  . amLODipine (NORVASC) 5 MG tablet TAKE 1 TABLET BY MOUTH  DAILY 90 tablet 3  . predniSONE (DELTASONE) 10 MG tablet Take 4 tablets (40 mg total) by mouth daily with breakfast for 7 days, THEN 3 tablets (30 mg total) daily with breakfast for 7 days, THEN 2 tablets (20 mg total) daily with breakfast for 7 days, THEN 1 tablet (10 mg total) daily with breakfast for 7 days. 70 tablet 0   No current facility-administered medications for this visit.     Objective:  BP 120/68   Pulse 90   Temp (!) 97.4 F (36.3 C) (Temporal)   Resp 18   Ht 5\' 10"  (1.778 m)   Wt 198 lb 9.6 oz (90.1 kg)  SpO2 96%   BMI 28.50 kg/m  Gen: NAD, resting comfortably, after 2 laps up and down the hall-significantly short of breath CV: RRR no murmurs rubs or gallops Lungs: CTAB no crackles, wheeze, rhonchi even after exertion Ext: no significant edema Skin: warm, dry   We walked patient and oxygen levels dropped from 96% to 85% (Hr up to 120)  then placed 2L [place] when he was seated and went back to 100. We walked him again with 2L of oxygen and levels only  dropped to 95%. He was not as winded with walking same distance. Seated afterwards still dropped to 90% and he felt more winded but not as severe as when at 85%.     Assessment and Plan  #Fatigue F/U/shortness of breath S: I saw patient December 07, 2019-started with significant change in overall symptoms 2 weeks prior to visit.  Noted profound fatigue, shortness of breath, loss of appetite.  Prior to this was able to go to the gym for 45 minutes at a time and then after symptoms started got to the point where he did not feel like he could walk to his mailbox easily.  Patient also has had ongoing issues with chronic cough-immunotherapy worsened his cough but did not resolve off immunotherapy.  We embarked on a significant work-up.  COVID-19 testing was negative.  Chest x-ray with right lower lung mass and consolidation with increased volume loss in the right lung space since prior study noted to be possibly related to radiation therapy as well as developing infiltration of left perihilar region concerning for pneumonia.  Patient was seen by pulmonology-has also been treated with 10 day course of levaquin.  Patient was already on Xarelto so we thought pulmonary embolism was less likely initially-later CT angiogram proved no pulmonary embolism but did show extensive bilateral groundglass and heterogeneous airspace opacity concerning for possible multifocal infection-our consideration was immune mediated pneumonitis.  Initial chest x-ray without overt fluid overload.  Echocardiogram largely reassuring without obvious heart failure.  No chest pain was reported.  Obviously patient does have stage III squamous cell carcinoma of the lungs but CT imaging has been reassuring.  Patient later had repeat CT January 05, 2020 which showed improvement in bilateral groundglass opacities-this was after Levaquin and prednisone  Ultimately, patient was treated with prednisone taper for potential immunotherapy mediated  pneumonitis.Patient mentioned that is fatigue started to slowly get better up until a week ago when it started to get worse. Life limiting- barely finished shower today, short of breath getting to mailbox  He was on prednisone taper until 01/06/20. States got better but perhaps only 50% back to his baseline. Concern for immunotherapy mediated pneumonitis. Did improve on tapered prednisone.every time he takes prednisone cough gets better but doesn't resolve- when stops prednisone it comes back.   Today,We walked patient and oxygen levels dropped from 96% to 85% (Hr up to 120)  then placed 2L [place] when he was seated and went back to 100. We walked him again with 2L of oxygen and levels only dropped to 95%. He was not as winded with walking same distance. Seated afterwards still dropped to 90% and he felt more winded but not as severe as when at 85%.   HR at home between 80-85 on atenolol 50mg  BID-not obviously A. fib with RVR A/P: 79 year old male with stage III squamous cell carcinoma of the lungs and also with concern for immunotherapy mediated pneumonitis presenting with worsening shortness of breath.  Patient noted about  50% improvement while on prednisone taper but seems like patient's symptoms are slowly returning.  I opted to restart another multiweek prednisone taper.  I am going to reach out to pulmonology and oncology to see if they have any other suggestions.  He has had 2 CTs in the last 6 weeks and I do not feel strongly about reordering imaging-and I am not competent chest x-ray would change decision-making. -Patient would also like to visit with cardiology to see if there is a cardiac component to her shortness of breath -He did have oxygen saturations dropping to 85% with walking him today-I am concerned this is more likely to be pulmonary in origin.  Levels improved once we placed patient on oxygen and he rested in the next time we walked patient with oxygen levels never dropped below 90% and he  noted improvement in shortness of breath.  He declines oxygen for now until cardiology follow-up but he is also open to pulmonology's opinion  Recommended follow up: No follow-ups on file. Future Appointments  Date Time Provider [place]  02/29/2020  2:40 PM [name], MD [facility]  03/05/2020 11:00 AM [facility] PHARMACIST [facility]  05/10/2020  8:30 AM [facility] None  05/13/2020  8:30 AM Curt Bears, MD [facility] None    Lab/Order associations:   ICD-10-CM   1. DOE (dyspnea on exertion)  R06.00 Ambulatory referral to Cardiology  2. Abnormal findings on diagnostic imaging of lung  R91.8   3. Shortness of breath  R06.02     Meds ordered this encounter  Medications  . predniSONE (DELTASONE) 10 MG tablet    Sig: Take 4 tablets (40 mg total) by mouth daily with breakfast for 7 days, THEN 3 tablets (30 mg total) daily with breakfast for 7 days, THEN 2 tablets (20 mg total) daily with breakfast for 7 days, THEN 1 tablet (10 mg total) daily with breakfast for 7 days.    Dispense:  70 tablet    Refill:  0    Time Spent: 44 minutes of total time (3:45PM-4:13 PM, 5:32 PM-5:48 PM) was spent on the date of the encounter performing the following actions: chart review prior to seeing the patient, obtaining history, performing a medically necessary exam, counseling on the treatment plan, placing orders, and documenting in our EHR.   Return precautions advised.  Garret Reddish, MD

## 2020-01-26 ENCOUNTER — Other Ambulatory Visit: Payer: Self-pay

## 2020-01-26 ENCOUNTER — Ambulatory Visit (INDEPENDENT_AMBULATORY_CARE_PROVIDER_SITE_OTHER): Payer: Medicare Other | Admitting: Family Medicine

## 2020-01-26 ENCOUNTER — Encounter: Payer: Self-pay | Admitting: Family Medicine

## 2020-01-26 ENCOUNTER — Telehealth: Payer: Self-pay | Admitting: Pulmonary Disease

## 2020-01-26 VITALS — BP 120/68 | HR 90 | Temp 97.4°F | Resp 18 | Ht 70.0 in | Wt 198.6 lb

## 2020-01-26 DIAGNOSIS — R0602 Shortness of breath: Secondary | ICD-10-CM

## 2020-01-26 DIAGNOSIS — R0600 Dyspnea, unspecified: Secondary | ICD-10-CM

## 2020-01-26 DIAGNOSIS — C3491 Malignant neoplasm of unspecified part of right bronchus or lung: Secondary | ICD-10-CM

## 2020-01-26 DIAGNOSIS — R0609 Other forms of dyspnea: Secondary | ICD-10-CM

## 2020-01-26 MED ORDER — PREDNISONE 10 MG PO TABS: ORAL_TABLET | ORAL | 0 refills | Status: AC

## 2020-01-26 NOTE — Telephone Encounter (Signed)
Can we schedule Martin Bailey for follow-up with myself or Aaron Edelman  -Will need a pulmonary function test -Exercise desaturation test to see if he qualifies for oxygen supplementation

## 2020-01-29 NOTE — Telephone Encounter (Signed)
Spoke with pt and scheduled f/u with Dr Ander Slade 02/01/20 2:30 with qualifying walk. Order placed for PFT. Pt verbalized understanding. Nothing further needed.

## 2020-01-30 LAB — CULTURE, FUNGUS WITHOUT SMEAR

## 2020-02-01 ENCOUNTER — Other Ambulatory Visit: Payer: Self-pay

## 2020-02-01 ENCOUNTER — Ambulatory Visit: Payer: Medicare Other | Admitting: Pulmonary Disease

## 2020-02-01 ENCOUNTER — Encounter: Payer: Self-pay | Admitting: Pulmonary Disease

## 2020-02-01 VITALS — BP 136/70 | HR 77 | Temp 97.3°F | Ht 70.0 in | Wt 200.8 lb

## 2020-02-01 DIAGNOSIS — R5382 Chronic fatigue, unspecified: Secondary | ICD-10-CM

## 2020-02-01 DIAGNOSIS — R0602 Shortness of breath: Secondary | ICD-10-CM

## 2020-02-01 NOTE — Progress Notes (Signed)
Martin Bailey    [id number]    September 29, 1940  Primary Care Physician:Hunter, [name], MD  Referring Physician: [name], MD [address] [place],  [place] [address]  Chief complaint:    He is in for follow-up Continues to have fatigue  HPI: He feels fatigue is better Able to take deeper breaths  Is currently on prednisone taper 40 mg then 30 mg then 20 then 10, tapered weekly.  He does not think he is ready for oxygen use at the present time so we did discuss procuring a pulse oximeter to routinely check his oxygen levels  When he came in today his oxygen was 97%  He stated he would usually get very short of breath with walking up many steps, checking his oxygen levels after this type of exertion will at least clarify whether he requires oxygen supplementation  I did reiterate studies CAT scan is better He seems to be feeling a little bit better now as well  He has not been wheezing I believe we will be better served if he has a PFT that shows obstructive disease prior to initiating bronchodilators  Recent CT scan findings did improve with steroid use  He still has persistent fatigue but this is overall improving as well.  Had a chest x-ray performed in June 2019 showing haziness at the right base He was treated for an infection A repeat chest x-ray revealed persistence of the lesion leading to the CT scan of the chest been performed Diagnosed with cancer with bronchoscopy-squamous cell cancer-had chemoradiation treatment Interstitial pneumonitis secondary to immunotherapy was considered for his recent CT scan changes  Currently has no cough or discomfort Persistent complaints of fatigue  Appetite is good No night sweats  He has a hobby doing woodworking which is done for 40 years Quit smoking about 40 years ago about 20-pack-year smoking history  Father had lung cancer-he smoked   Outpatient Encounter Medications as of 02/01/2020    Medication Sig  . atenolol (TENORMIN) 50 MG tablet Take 1 tablet (50 mg total) by mouth 2 (two) times daily.  . clobetasol (TEMOVATE) 0.05 % external solution Apply 1 application topically 2 (two) times daily. For seborrheic dermatitis- 7 days maximum (Patient taking differently: Apply 1 application topically 2 (two) times daily as needed (scalp itch). )  . Multiple Vitamin (MULTIVITAMIN WITH MINERALS) TABS tablet Take 1 tablet by mouth daily.  . predniSONE (DELTASONE) 10 MG tablet Take 4 tablets (40 mg total) by mouth daily with breakfast for 7 days, THEN 3 tablets (30 mg total) daily with breakfast for 7 days, THEN 2 tablets (20 mg total) daily with breakfast for 7 days, THEN 1 tablet (10 mg total) daily with breakfast for 7 days.  . rivaroxaban (XARELTO) 20 MG TABS tablet Take 20 mg by mouth daily.  Marland Kitchen triamcinolone cream (KENALOG) 0.5 % Apply topically 2 (two) times daily as needed. (Patient taking differently: Apply 1 application topically 2 (two) times daily as needed (rash). )  . amLODipine (NORVASC) 5 MG tablet TAKE 1 TABLET BY MOUTH  DAILY   No facility-administered encounter medications on file as of 02/01/2020.    Allergies as of 02/01/2020 - Review Complete 02/01/2020  Allergen Reaction Noted  . Hydrocodone Other (See Comments) 09/18/2019    Past Medical History:  Diagnosis Date  . Cataract    left  . COLONIC POLYPS, HX OF 04/05/2007   ONE ADENOMATOUS POLYP AND ONE HYPERPLASTIC  POLYP  . DEPRESSION 03/04/2007  . DERMATITIS, SEBORRHEIC 07/19/2007  . FIBRILLATION, ATRIAL 03/04/2007  . Hearing aid worn    B/L  . HYPERLIPIDEMIA 03/04/2007  . HYPERTENSION 03/04/2007  . Kidney stones   . Lung cancer ([place]) dx'd 02/2019   non small cell lung ca  . Lung mass    mediastinal adenopathy    Past Surgical History:  Procedure Laterality Date  . BIOPSY  01/09/2020   Procedure: BIOPSY;  Surgeon: Laurin Coder, MD;  Location: [facility];  Service: Endoscopy;;  . BRONCHIAL WASHINGS   01/09/2020   Procedure: BRONCHIAL WASHINGS;  Surgeon: Laurin Coder, MD;  Location: [facility];  Service: Endoscopy;;  . CATARACT EXTRACTION W/ INTRAOCULAR LENS IMPLANT     right  . COLONOSCOPY    . IR IMAGING GUIDED PORT INSERTION  01/31/2019  . RADIOFREQUENCY ABLATION     x2 for fib, on 2nd attempt switched form a flutter ot a fib.   Marland Kitchen ROTATOR CUFF REPAIR Right   . TONSILLECTOMY    . VIDEO BRONCHOSCOPY N/A 01/09/2020   Procedure: VIDEO BRONCHOSCOPY WITH FLUORO;  Surgeon: Laurin Coder, MD;  Location: [facility];  Service: Endoscopy;  Laterality: N/A;  . VIDEO BRONCHOSCOPY WITH ENDOBRONCHIAL ULTRASOUND Left 12/30/2018   Procedure: VIDEO BRONCHOSCOPY WITH ENDOBRONCHIAL ULTRASOUND WITH FLUOROSCOPY;  Surgeon: Laurin Coder, MD;  Location: [facility];  Service: Thoracic;  Laterality: Left;  . WISDOM TOOTH EXTRACTION      Family History  Problem Relation Age of Onset  . Osteoporosis Mother   . Dementia Mother   . Lung cancer Father        former smoker  . Colon cancer Neg Hx   . Stomach cancer Neg Hx   . Esophageal cancer Neg Hx   . Rectal cancer Neg Hx     Social History   Socioeconomic History  . Marital status: Widowed    Spouse name: Not on file  . Number of children: 1  . Years of education: Not on file  . Highest education level: Not on file  Occupational History  . Occupation: retired  Tobacco Use  . Smoking status: Former Smoker    Packs/day: 1.50    Years: 10.00    Pack years: 15.00    Types: Cigarettes    Quit date: 07/08/1977    Years since quitting: 42.5  . Smokeless tobacco: Never Used  Vaping Use  . Vaping Use: Never used  Substance and Sexual Activity  . Alcohol use: Yes    Alcohol/week: 42.0 standard drinks    Types: 42 Standard drinks or equivalent per week    Comment: 4 scotch's a day  . Drug use: No  . Sexual activity: Not on file  Other Topics Concern  . Not on file  Social History Narrative   Widower-wife died 53. 1 daughter.  2 grandkids. 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       Hobbies: golf, bridge, computer, sports, woodworking   Social Determinants of Health   Financial Resource Strain:   . Difficulty of Paying Living Expenses: Not on file  Food Insecurity:   . Worried About Charity fundraiser in the Last Year: Not on file  . Ran Out of Food in the Last Year: Not on file  Transportation Needs:   . Lack of Transportation (Medical): Not on file  .  Lack of Transportation (Non-Medical): Not on file  Physical Activity:   . Days of Exercise per Week: Not on file  . Minutes of Exercise per Session: Not on file  Stress:   . Feeling of Stress : Not on file  Social Connections:   . Frequency of Communication with Friends and Family: Not on file  . Frequency of Social Gatherings with Friends and Family: Not on file  . Attends Religious Services: Not on file  . Active Member of Clubs or Organizations: Not on file  . Attends Archivist Meetings: Not on file  . Marital Status: Not on file  Intimate Partner Violence:   . Fear of Current or Ex-Partner: Not on file  . Emotionally Abused: Not on file  . Physically Abused: Not on file  . Sexually Abused: Not on file    Review of Systems  Constitutional: Positive for fatigue.  HENT: Negative.   Eyes: Negative.   Respiratory: Negative for shortness of breath.   Cardiovascular: Negative.   Gastrointestinal: Negative.   Endocrine: Negative.   Psychiatric/Behavioral: Negative for sleep disturbance.    Vitals:   02/01/20 1432  BP: 136/70  Pulse: 77  Temp: (!) 97.3 F (36.3 C)  SpO2: 97%     Physical Exam Constitutional:      Appearance: Normal appearance. He is well-developed.  HENT:     Head: Normocephalic and atraumatic.  Eyes:     General:        Right eye: No discharge.        Left eye: No discharge.  Neck:      Thyroid: No thyromegaly.     Trachea: No tracheal deviation.  Cardiovascular:     Rate and Rhythm: Normal rate and regular rhythm.  Pulmonary:     Effort: Pulmonary effort is normal. No respiratory distress.     Breath sounds: Normal breath sounds. No stridor. No wheezing, rhonchi or rales.  Musculoskeletal:     Cervical back: No rigidity or tenderness.  Neurological:     Mental Status: He is alert.  Psychiatric:        Mood and Affect: Mood normal.    Data Reviewed: CT scan of his chest reviewed again  Recent bronchoscopy notes reviewed   Assessment:  Lung mass -S/p chemoradiation for squamous cell lung cancer  Chronic fatigue -Improving with steroid use -Currently on tapering doses of steroids  Chronic cough  Past smoking history  Concern for desaturations with activity  Plan/Recommendations:  Clinical monitoring of symptoms  Graded exercise as tolerated  Continue follow-up with oncology  He does have an appointment with cardiology  Encouraged to procure a pulse oximeter to check his oxygen levels  We will schedule him for pulmonary function test  I will tentatively see him in about 6 months  Sherrilyn Rist MD [facility] 02/01/2020, 3:03 PM  CC: [name], MD

## 2020-02-01 NOTE — Patient Instructions (Signed)
Follow-up appointment in 6 weeks  Completed a tapering course of steroids currently 40 mg then 30 then 20 then 10 taper down on a weekly basis  Obtain a pulse oximeter to check your oxygen saturations, if dropping below 88% with activity, this may mean you need oxygen supplementation  We will call you to set up a pulmonary function test   Call with significant concerns

## 2020-02-07 ENCOUNTER — Encounter: Payer: Self-pay | Admitting: Cardiology

## 2020-02-07 ENCOUNTER — Ambulatory Visit (INDEPENDENT_AMBULATORY_CARE_PROVIDER_SITE_OTHER): Payer: Medicare Other | Admitting: Cardiology

## 2020-02-07 VITALS — BP 122/78 | HR 89 | Ht 70.0 in | Wt 195.6 lb

## 2020-02-07 DIAGNOSIS — I482 Chronic atrial fibrillation, unspecified: Secondary | ICD-10-CM

## 2020-02-07 DIAGNOSIS — R0602 Shortness of breath: Secondary | ICD-10-CM

## 2020-02-07 DIAGNOSIS — E785 Hyperlipidemia, unspecified: Secondary | ICD-10-CM | POA: Diagnosis not present

## 2020-02-07 NOTE — Progress Notes (Signed)
Cardiology Office Note:    Date:  02/08/2020   ID:  [name], [place] 1941/02/25, MRN [id number]  PCP:  [name], MD  Cardiologist:  No primary care provider on file.  Electrophysiologist:  None   Referring MD: [name], MD   Chief Complaint  Patient presents with  . Shortness of Breath    History of Present Illness:    Martin Bailey is a 79 y.o. male with a hx of non-small cell lung cancer, atrial fibrillation/flutter who is referred by [name] for evaluation of dyspnea.  Reports he underwent 2 ablations for atrial flutter and has been in atrial fibrillation for 20 years.  He is on Xarelto.  Reports that recently he has been having issues with shortness of breath with minimal exertion.  Reports that walking up stairs causes him to be short of breath.  Prior to the start of the symptoms in September, he was going to the gym 3 times per week for 45 minute cardiovascular workout without any symptoms.  He denies any chest pain.  Does report lightheadedness with standing, denies any syncope.  Denies any lower extremity edema or palpitations. Reports he quit smoking 42 years ago.  No history of heart disease in his immediate family.  Echocardiogram on 01/03/2020 showed EF 60 to 65%, normal RV function, severely dilated left atrium, mildly dilated right atrium, no significant valvular disease.  CTPA on 12/14/2019 showed no evidence of PE, new extensive bilateral groundglass airspace opacity consistent with multifocal infection.  Seen by pulmonology, started on prednisone taper.  Does report some improvement with his symptoms.  Past Medical History:  Diagnosis Date  . Cataract    left  . COLONIC POLYPS, HX OF 04/05/2007   ONE ADENOMATOUS POLYP AND ONE HYPERPLASTIC POLYP  . DEPRESSION 03/04/2007  . DERMATITIS, SEBORRHEIC 07/19/2007  . FIBRILLATION, ATRIAL 03/04/2007  . Hearing aid worn    B/L  . HYPERLIPIDEMIA 03/04/2007  . HYPERTENSION 03/04/2007  . Kidney  stones   . Lung cancer ([place]) dx'd 02/2019   non small cell lung ca  . Lung mass    mediastinal adenopathy    Past Surgical History:  Procedure Laterality Date  . BIOPSY  01/09/2020   Procedure: BIOPSY;  Surgeon: Laurin Coder, MD;  Location: [facility];  Service: Endoscopy;;  . BRONCHIAL WASHINGS  01/09/2020   Procedure: BRONCHIAL WASHINGS;  Surgeon: Laurin Coder, MD;  Location: [facility];  Service: Endoscopy;;  . CATARACT EXTRACTION W/ INTRAOCULAR LENS IMPLANT     right  . COLONOSCOPY    . IR IMAGING GUIDED PORT INSERTION  01/31/2019  . RADIOFREQUENCY ABLATION     x2 for fib, on 2nd attempt switched form a flutter ot a fib.   Marland Kitchen ROTATOR CUFF REPAIR Right   . TONSILLECTOMY    . VIDEO BRONCHOSCOPY N/A 01/09/2020   Procedure: VIDEO BRONCHOSCOPY WITH FLUORO;  Surgeon: Laurin Coder, MD;  Location: [facility];  Service: Endoscopy;  Laterality: N/A;  . VIDEO BRONCHOSCOPY WITH ENDOBRONCHIAL ULTRASOUND Left 12/30/2018   Procedure: VIDEO BRONCHOSCOPY WITH ENDOBRONCHIAL ULTRASOUND WITH FLUOROSCOPY;  Surgeon: Laurin Coder, MD;  Location: [place];  Service: Thoracic;  Laterality: Left;  . WISDOM TOOTH EXTRACTION      Current Medications: Current Meds  Medication Sig  . atenolol (TENORMIN) 50 MG tablet Take 1 tablet (50 mg total) by mouth 2 (two) times daily.  . clobetasol (TEMOVATE) 0.05 % external solution Apply 1 application topically 2 (two) times  daily. For seborrheic dermatitis- 7 days maximum (Patient taking differently: Apply 1 application topically 2 (two) times daily as needed (scalp itch). )  . Multiple Vitamin (MULTIVITAMIN WITH MINERALS) TABS tablet Take 1 tablet by mouth daily.  . predniSONE (DELTASONE) 10 MG tablet Take 4 tablets (40 mg total) by mouth daily with breakfast for 7 days, THEN 3 tablets (30 mg total) daily with breakfast for 7 days, THEN 2 tablets (20 mg total) daily with breakfast for 7 days, THEN 1 tablet (10 mg total) daily with breakfast  for 7 days.  . rivaroxaban (XARELTO) 20 MG TABS tablet Take 20 mg by mouth daily.  Marland Kitchen triamcinolone cream (KENALOG) 0.5 % Apply topically 2 (two) times daily as needed. (Patient taking differently: Apply 1 application topically 2 (two) times daily as needed (rash). )     Allergies:   Hydrocodone   Social History   Socioeconomic History  . Marital status: Widowed    Spouse name: Not on file  . Number of children: 1  . Years of education: Not on file  . Highest education level: Not on file  Occupational History  . Occupation: retired  Tobacco Use  . Smoking status: Former Smoker    Packs/day: 1.50    Years: 10.00    Pack years: 15.00    Types: Cigarettes    Quit date: 07/08/1977    Years since quitting: 42.6  . Smokeless tobacco: Never Used  Vaping Use  . Vaping Use: Never used  Substance and Sexual Activity  . Alcohol use: Yes    Alcohol/week: 42.0 standard drinks    Types: 42 Standard drinks or equivalent per week    Comment: 4 scotch's a day  . Drug use: No  . Sexual activity: Not on file  Other Topics Concern  . Not on file  Social History Narrative   Widower-wife died 17. 1 daughter. 2 grandkids. 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       Hobbies: golf, bridge, computer, sports, woodworking   Social Determinants of Health   Financial Resource Strain:   . Difficulty of Paying Living Expenses: Not on file  Food Insecurity:   . Worried About Charity fundraiser in the Last Year: Not on file  . Ran Out of Food in the Last Year: Not on file  Transportation Needs:   . Lack of Transportation (Medical): Not on file  . Lack of Transportation (Non-Medical): Not on file  Physical Activity:   . Days of Exercise per Week: Not on file  . Minutes of Exercise per Session: Not on file  Stress:   . Feeling of Stress : Not on file  Social Connections:     . Frequency of Communication with Friends and Family: Not on file  . Frequency of Social Gatherings with Friends and Family: Not on file  . Attends Religious Services: Not on file  . Active Member of Clubs or Organizations: Not on file  . Attends Archivist Meetings: Not on file  . Marital Status: Not on file     Family History: The patient's family history includes Dementia in his mother; Lung cancer in his father; Osteoporosis in his mother. There is no history of Colon cancer, Stomach cancer, Esophageal cancer, or Rectal cancer.  ROS:   Please see the history of present illness.     All  other systems reviewed and are negative.  EKGs/Labs/Other Studies Reviewed:    The following studies were reviewed today:   EKG:  EKG is  ordered today.  The ekg ordered today demonstrates atrial fibrillation, rate 89, left axis deviation  Recent Labs: 12/20/2019: B Natriuretic Peptide 636.3 01/05/2020: ALT 47 01/08/2020: BUN 16; Creatinine 0.86; Hemoglobin 14.8; Platelet Count 121; Potassium 5.0; Sodium 136; TSH 1.959  Recent Lipid Panel    Component Value Date/Time   CHOL 159 02/10/2018 0904   TRIG 56.0 02/10/2018 0904   HDL 48.50 02/10/2018 0904   CHOLHDL 3 02/10/2018 0904   VLDL 11.2 02/10/2018 0904   LDLCALC 100 (H) 02/10/2018 0904   LDLDIRECT 91.0 02/09/2017 0855    Physical Exam:    VS:  BP 122/78   Pulse 89   Ht 5\' 10"  (1.778 m)   Wt 195 lb 9.6 oz (88.7 kg)   SpO2 99%   BMI 28.07 kg/m     Wt Readings from Last 3 Encounters:  02/07/20 195 lb 9.6 oz (88.7 kg)  02/01/20 200 lb 12.8 oz (91.1 kg)  01/26/20 198 lb 9.6 oz (90.1 kg)     GEN:  in no acute distress HEENT: Normal NECK: No JVD; No carotid bruits CARDIAC: irregular, normal rates, no murmurs, rubs, gallops RESPIRATORY:  Clear to auscultation without rales, wheezing or rhonchi  ABDOMEN: Soft, non-tender, non-distended MUSCULOSKELETAL:  No edema; No deformity  SKIN: Warm and dry NEUROLOGIC:  Alert and  oriented x 3 PSYCHIATRIC:  Normal affect   ASSESSMENT:    1. Shortness of breath   2. Hyperlipidemia, unspecified hyperlipidemia type   3. Chronic atrial fibrillation (HCC)    PLAN:    Shortness of breath: Suspect pulmonary etiology, does appear to be improving with prednisone taper.  Echocardiogram unremarkable.  Does have multivessel coronary calcifications on CT and dyspnea could represent anginal equivalent.  Will evaluate further with Lexiscan Myoview.  Hyperlipidemia: Will check lipid panel and plan calcium score to guide how aggressive to be in lowering cholesterol.  Suspect will need to start statin.  Chronic atrial fibrillation: Rates well controlled, continue atenolol.  Continue Xarelto for anticoagulation.  RTC in 3 months  Medication Adjustments/Labs and Tests Ordered: Current medicines are reviewed at length with the patient today.  Concerns regarding medicines are outlined above.  Orders Placed This Encounter  Procedures  . CT CARDIAC SCORING  . Lipid panel  . MYOCARDIAL PERFUSION IMAGING  . EKG 12-Lead   No orders of the defined types were placed in this encounter.   Patient Instructions  Medication Instructions:  No changes *If you need a refill on your cardiac medications before your next appointment, please call your pharmacy*   Lab Work: Your provider would like for you to return in a few weeks to have the following labs drawn: fasting Lipid. You do not need an appointment for the lab. Once in our office lobby there is a podium where you can sign in and ring the doorbell to alert [place] that you are here. The lab is open from 8:00 am to 4:30 pm; closed for lunch from 12:45pm-1:45pm.  If you have labs (blood work) drawn today and your tests are completely normal, you will receive your results only by: Marland Kitchen MyChart Message (if you have MyChart) OR . A paper copy in the mail If you have any lab test that is abnormal or we need to change your treatment, we will call  you to review the results.   Testing/Procedures:  Dr. Gardiner Rhyme has ordered a CT coronary calcium score. This test is done at [address]. This is $150 out of pocket.   Coronary CalciumScan A coronary calcium scan is an imaging test used to look for deposits of calcium and other fatty materials (plaques) in the inner lining of the blood vessels of the heart (coronary arteries). These deposits of calcium and plaques can partly clog and narrow the coronary arteries without producing any symptoms or warning signs. This puts a person at risk for a heart attack. This test can detect these deposits before symptoms develop. Tell a health care provider about:  Any allergies you have.  All medicines you are taking, including vitamins, herbs, eye drops, creams, and over-the-counter medicines.  Any problems you or family members have had with anesthetic medicines.  Any blood disorders you have.  Any surgeries you have had.  Any medical conditions you have.  Whether you are pregnant or may be pregnant. What are the risks? Generally, this is a safe procedure. However, problems may occur, including:  Harm to a pregnant woman and her unborn baby. This test involves the use of radiation. Radiation exposure can be dangerous to a pregnant woman and her unborn baby. If you are pregnant, you generally should not have this procedure done.  Slight increase in the risk of cancer. This is because of the radiation involved in the test. What happens before the procedure? No preparation is needed for this procedure. What happens during the procedure?  You will undress and remove any jewelry around your neck or chest.  You will put on a hospital gown.  Sticky electrodes will be placed on your chest. The electrodes will be connected to an electrocardiogram (ECG) machine to record a tracing of the electrical activity of your heart.  A CT scanner will take pictures of your heart. During  this time, you will be asked to lie still and hold your breath for 2-3 seconds while a picture of your heart is being taken. The procedure may vary among health care providers and hospitals. What happens after the procedure?  You can get dressed.  You can return to your normal activities.  It is up to you to get the results of your test. Ask your health care provider, or the department that is doing the test, when your results will be ready. Summary  A coronary calcium scan is an imaging test used to look for deposits of calcium and other fatty materials (plaques) in the inner lining of the blood vessels of the heart (coronary arteries).  Generally, this is a safe procedure. Tell your health care provider if you are pregnant or may be pregnant.  No preparation is needed for this procedure.  A CT scanner will take pictures of your heart.  You can return to your normal activities after the scan is done. This information is not intended to replace advice given to you by your health care provider. Make sure you discuss any questions you have with your health care provider. Document Released: 09/12/2007 Document Revised: 02/03/2016 Document Reviewed: 02/03/2016 Elsevier Interactive Patient Education  2017 [place] physician has requested that you have a lexiscan myoview. For further information please visit [email or web address]. Please follow instruction sheet, as given.   How to prepare for your Myocardial Perfusion Test:  Do not eat or drink 3 hours prior to your test, except you may have water.  Do not consume products containing caffeine (regular or decaffeinated)  12 hours prior to your test. (ex: coffee, chocolate, sodas, tea).  Do bring a list of your current medications with you.  If not listed below, you may take your medications as normal.  Do wear comfortable clothes (no dresses or overalls) and walking shoes, tennis shoes preferred (No heels or open toe shoes are  allowed).  Do NOT wear cologne, perfume, aftershave, or lotions (deodorant is allowed).  The test will take approximately 3 to 4 hours to complete  If these instructions are not followed, your test will have to be rescheduled.   Follow-Up: At [facility], you and your health needs are our priority.  As part of our continuing mission to provide you with exceptional heart care, we have created designated Provider Care Teams.  These Care Teams include your primary Cardiologist (physician) and Advanced Practice Providers (APPs -  Physician Assistants and Nurse Practitioners) who all work together to provide you with the care you need, when you need it.  We recommend signing up for the patient portal called "MyChart".  Sign up information is provided on this After Visit Summary.  MyChart is used to connect with patients for Virtual Visits (Telemedicine).  Patients are able to view lab/test results, encounter notes, upcoming appointments, etc.  Non-urgent messages can be sent to your provider as well.   To learn more about what you can do with MyChart, go to [email or web address].    Your next appointment:   3 month(s)  The format for your next appointment:   In Person  Provider:   You may see Dr. Gardiner Rhyme or one of the following Advanced Practice Providers on your designated Care Team:    Rosaria Ferries, PA-C  Jory Sims, DNP, ANP      Signed, Donato Heinz, MD  02/08/2020 3:35 PM    [place]

## 2020-02-07 NOTE — Patient Instructions (Signed)
Medication Instructions:  No changes *If you need a refill on your cardiac medications before your next appointment, please call your pharmacy*   Lab Work: Your provider would like for you to return in a few weeks to have the following labs drawn: fasting Lipid. You do not need an appointment for the lab. Once in our office lobby there is a podium where you can sign in and ring the doorbell to alert [place] that you are here. The lab is open from 8:00 am to 4:30 pm; closed for lunch from 12:45pm-1:45pm.  If you have labs (blood work) drawn today and your tests are completely normal, you will receive your results only by: Marland Kitchen MyChart Message (if you have MyChart) OR . A paper copy in the mail If you have any lab test that is abnormal or we need to change your treatment, we will call you to review the results.   Testing/Procedures: Dr. Gardiner Rhyme has ordered a CT coronary calcium score. This test is done at [address]. This is $150 out of pocket.   Coronary CalciumScan A coronary calcium scan is an imaging test used to look for deposits of calcium and other fatty materials (plaques) in the inner lining of the blood vessels of the heart (coronary arteries). These deposits of calcium and plaques can partly clog and narrow the coronary arteries without producing any symptoms or warning signs. This puts a person at risk for a heart attack. This test can detect these deposits before symptoms develop. Tell a health care provider about:  Any allergies you have.  All medicines you are taking, including vitamins, herbs, eye drops, creams, and over-the-counter medicines.  Any problems you or family members have had with anesthetic medicines.  Any blood disorders you have.  Any surgeries you have had.  Any medical conditions you have.  Whether you are pregnant or may be pregnant. What are the risks? Generally, this is a safe procedure. However, problems may occur,  including:  Harm to a pregnant woman and her unborn baby. This test involves the use of radiation. Radiation exposure can be dangerous to a pregnant woman and her unborn baby. If you are pregnant, you generally should not have this procedure done.  Slight increase in the risk of cancer. This is because of the radiation involved in the test. What happens before the procedure? No preparation is needed for this procedure. What happens during the procedure?  You will undress and remove any jewelry around your neck or chest.  You will put on a hospital gown.  Sticky electrodes will be placed on your chest. The electrodes will be connected to an electrocardiogram (ECG) machine to record a tracing of the electrical activity of your heart.  A CT scanner will take pictures of your heart. During this time, you will be asked to lie still and hold your breath for 2-3 seconds while a picture of your heart is being taken. The procedure may vary among health care providers and hospitals. What happens after the procedure?  You can get dressed.  You can return to your normal activities.  It is up to you to get the results of your test. Ask your health care provider, or the department that is doing the test, when your results will be ready. Summary  A coronary calcium scan is an imaging test used to look for deposits of calcium and other fatty materials (plaques) in the inner lining of the blood vessels of the heart (  coronary arteries).  Generally, this is a safe procedure. Tell your health care provider if you are pregnant or may be pregnant.  No preparation is needed for this procedure.  A CT scanner will take pictures of your heart.  You can return to your normal activities after the scan is done. This information is not intended to replace advice given to you by your health care provider. Make sure you discuss any questions you have with your health care provider. Document Released: 09/12/2007  Document Revised: 02/03/2016 Document Reviewed: 02/03/2016 Elsevier Interactive Patient Education  2017 [place] physician has requested that you have a lexiscan myoview. For further information please visit [email or web address]. Please follow instruction sheet, as given.   How to prepare for your Myocardial Perfusion Test:  Do not eat or drink 3 hours prior to your test, except you may have water.  Do not consume products containing caffeine (regular or decaffeinated) 12 hours prior to your test. (ex: coffee, chocolate, sodas, tea).  Do bring a list of your current medications with you.  If not listed below, you may take your medications as normal.  Do wear comfortable clothes (no dresses or overalls) and walking shoes, tennis shoes preferred (No heels or open toe shoes are allowed).  Do NOT wear cologne, perfume, aftershave, or lotions (deodorant is allowed).  The test will take approximately 3 to 4 hours to complete  If these instructions are not followed, your test will have to be rescheduled.   Follow-Up: At [facility], you and your health needs are our priority.  As part of our continuing mission to provide you with exceptional heart care, we have created designated Provider Care Teams.  These Care Teams include your primary Cardiologist (physician) and Advanced Practice Providers (APPs -  Physician Assistants and Nurse Practitioners) who all work together to provide you with the care you need, when you need it.  We recommend signing up for the patient portal called "MyChart".  Sign up information is provided on this After Visit Summary.  MyChart is used to connect with patients for Virtual Visits (Telemedicine).  Patients are able to view lab/test results, encounter notes, upcoming appointments, etc.  Non-urgent messages can be sent to your provider as well.   To learn more about what you can do with MyChart, go to [email or web address].    Your next appointment:    3 month(s)  The format for your next appointment:   In Person  Provider:   You may see Dr. Gardiner Rhyme or one of the following Advanced Practice Providers on your designated Care Team:    Rosaria Ferries, PA-C  Jory Sims, DNP, ANP

## 2020-02-08 ENCOUNTER — Telehealth: Payer: Self-pay | Admitting: Cardiology

## 2020-02-08 NOTE — Telephone Encounter (Signed)
Spoke with patient regarding appointment scheduled Tuesday 03/12/20 at 2:00 pm for calcium scoring ordered by Dr. Marcelino Freestone N. [address],  Will mail information to patient and it is also available in My Chart.  Patient voiced his understanding.

## 2020-02-16 ENCOUNTER — Telehealth: Payer: Self-pay | Admitting: Cardiology

## 2020-02-16 DIAGNOSIS — R0602 Shortness of breath: Secondary | ICD-10-CM

## 2020-02-16 NOTE — Telephone Encounter (Signed)
Patient was told he was going to have a Stress Test done. There are no orders to schedule said test. Please let the patient know what he needs to do next.

## 2020-02-16 NOTE — Telephone Encounter (Signed)
Spoke with patient regarding appointment  for Lexiscan myoview---scheduled Wednesday 02/21/20 at 8:00 am at [facility].  Patient is aware someone will call with instructions.

## 2020-02-16 NOTE — Telephone Encounter (Signed)
OV with Dr. Gardiner Rhyme 11/10-lexi ordered and CT calcium score.    Order appears to have gotten linked to appt somehow.     Order replaced and will have scheduler call to arrange.

## 2020-02-19 DIAGNOSIS — E785 Hyperlipidemia, unspecified: Secondary | ICD-10-CM | POA: Diagnosis not present

## 2020-02-19 LAB — LIPID PANEL
Chol/HDL Ratio: 3.1 ratio (ref 0.0–5.0)
Cholesterol, Total: 210 mg/dL — ABNORMAL HIGH (ref 100–199)
HDL: 67 mg/dL (ref >39)
LDL Chol Calc (NIH): 125 mg/dL — ABNORMAL HIGH (ref 0–99)
Triglycerides: 101 mg/dL (ref 0–149)
VLDL Cholesterol Cal: 18 mg/dL (ref 5–40)

## 2020-02-20 ENCOUNTER — Telehealth (HOSPITAL_COMMUNITY): Payer: Self-pay | Admitting: *Deleted

## 2020-02-20 NOTE — Telephone Encounter (Signed)
Patient returned call to [facility], will await a return call. Patient will be unavailable to reach by phone from 1-3:45 PM.

## 2020-02-20 NOTE — Telephone Encounter (Signed)
Close encounter 

## 2020-02-21 ENCOUNTER — Other Ambulatory Visit: Payer: Self-pay

## 2020-02-21 ENCOUNTER — Ambulatory Visit (HOSPITAL_COMMUNITY)
Admission: RE | Admit: 2020-02-21 | Discharge: 2020-02-21 | Disposition: A | Discharge status: Home / Self Care | Payer: Medicare Other | Source: Ambulatory Visit | Attending: Cardiovascular Disease | Admitting: Cardiovascular Disease

## 2020-02-21 DIAGNOSIS — R0602 Shortness of breath: Secondary | ICD-10-CM | POA: Insufficient documentation

## 2020-02-21 LAB — MYOCARDIAL PERFUSION IMAGING
LV dias vol: 69 mL (ref 62–150)
LV sys vol: 25 mL
Peak HR: 108 {beats}/min
Rest HR: 93 {beats}/min
SDS: 1
SRS: 2
SSS: 3
TID: 1.13

## 2020-02-21 MED ORDER — TECHNETIUM TC 99M TETROFOSMIN IV KIT
30.1000 | PACK | Freq: Once | INTRAVENOUS | Status: AC | PRN
  Administered 2020-02-21: 30.1 via INTRAVENOUS
  Filled 2020-02-21: qty 31

## 2020-02-21 MED ORDER — REGADENOSON 0.4 MG/5ML IV SOLN
0.4000 mg | Freq: Once | INTRAVENOUS | Status: AC
  Administered 2020-02-21: 0.4 mg via INTRAVENOUS

## 2020-02-21 MED ORDER — TECHNETIUM TC 99M TETROFOSMIN IV KIT
10.6000 | PACK | Freq: Once | INTRAVENOUS | Status: AC | PRN
  Administered 2020-02-21: 10.6 via INTRAVENOUS
  Filled 2020-02-21: qty 11

## 2020-02-23 LAB — ACID FAST CULTURE WITH REFLEXED SENSITIVITIES (MYCOBACTERIA): Acid Fast Culture: NEGATIVE

## 2020-02-27 ENCOUNTER — Other Ambulatory Visit: Payer: Self-pay

## 2020-02-27 ENCOUNTER — Ambulatory Visit (INDEPENDENT_AMBULATORY_CARE_PROVIDER_SITE_OTHER): Payer: Medicare Other | Admitting: Pulmonary Disease

## 2020-02-27 DIAGNOSIS — R0602 Shortness of breath: Secondary | ICD-10-CM | POA: Diagnosis not present

## 2020-02-27 LAB — PULMONARY FUNCTION TEST
DL/VA % pred: 74 %
DL/VA: 2.91 ml/min/mmHg/L
DLCO cor % pred: 49 %
DLCO cor: 12.34 ml/min/mmHg
DLCO unc % pred: 49 %
DLCO unc: 12.41 ml/min/mmHg
FEF 25-75 Post: 2.39 L/sec
FEF 25-75 Pre: 1.51 L/sec
FEF2575-%Change-Post: 58 %
FEF2575-%Pred-Post: 112 %
FEF2575-%Pred-Pre: 71 %
FEV1-%Change-Post: 11 %
FEV1-%Pred-Post: 79 %
FEV1-%Pred-Pre: 71 %
FEV1-Post: 2.38 L
FEV1-Pre: 2.14 L
FEV1FVC-%Change-Post: 4 %
FEV1FVC-%Pred-Pre: 100 %
FEV6-%Change-Post: 6 %
FEV6-%Pred-Post: 80 %
FEV6-%Pred-Pre: 75 %
FEV6-Post: 3.14 L
FEV6-Pre: 2.93 L
FEV6FVC-%Pred-Post: 106 %
FEV6FVC-%Pred-Pre: 106 %
FVC-%Change-Post: 5 %
FVC-%Pred-Post: 75 %
FVC-%Pred-Pre: 71 %
FVC-Post: 3.14 L
FVC-Pre: 2.96 L
Post FEV1/FVC ratio: 76 %
Post FEV6/FVC ratio: 100 %
Pre FEV1/FVC ratio: 72 %
Pre FEV6/FVC Ratio: 100 %
RV % pred: 90 %
RV: 2.36 L
TLC % pred: 73 %
TLC: 5.18 L

## 2020-02-27 NOTE — Progress Notes (Signed)
Full PFT performed today. °

## 2020-02-28 NOTE — Patient Instructions (Addendum)
Health Maintenance Due  Topic Date Due  . COLONOSCOPY - refer to [facility]- you will need to complete full cardiac workup before colonoscopy but want to get the ball rolling 07/28/2018   We need 1. Pulmonary function tests interpreted 2. Coronary calcium scoring

## 2020-02-28 NOTE — Progress Notes (Addendum)
Phone: [phone]   Subjective:  Patient presents today for their annual physical. Chief complaint-noted.   See problem oriented charting- ROS- full  review of systems was completed and negative  except for: fatigue, shortness of breath  The following were reviewed and entered/updated in epic: Past Medical History:  Diagnosis Date  . Cataract    left  . COLONIC POLYPS, HX OF 04/05/2007   ONE ADENOMATOUS POLYP AND ONE HYPERPLASTIC POLYP  . DEPRESSION 03/04/2007  . DERMATITIS, SEBORRHEIC 07/19/2007  . FIBRILLATION, ATRIAL 03/04/2007  . Hearing aid worn    B/L  . HYPERLIPIDEMIA 03/04/2007  . HYPERTENSION 03/04/2007  . Kidney stones   . Lung cancer ([place]) dx'd 02/2019   non small cell lung ca  . Lung mass    mediastinal adenopathy   Patient Active Problem List   Diagnosis Date Noted  . Pneumonitis, interstitial ([place]) 06/29/2019    Priority: High  . Stage III squamous cell carcinoma of right lung ([place]) 01/06/2019    Priority: High  . Alcoholism ([place]) 06/21/2014    Priority: High  . Atrial fibrillation ([place]) 03/04/2007    Priority: High  . Elevated TSH 09/21/2014    Priority: Medium  . Seborrheic dermatitis 07/19/2007    Priority: Medium  . Hyperlipidemia 03/04/2007    Priority: Medium  . Major depression ([place]) 03/04/2007    Priority: Medium  . Essential hypertension 03/04/2007    Priority: Medium  . Former smoker 06/21/2014    Priority: Low  . Allergic rhinitis 04/06/2014    Priority: Low  . GERD (gastroesophageal reflux disease) 04/06/2014    Priority: Low  . Hx of adenomatous colonic polyps 03/05/2014    Priority: Low  . Chronic anticoagulation 03/05/2014    Priority: Low  . Testosterone deficiency 09/02/2010    Priority: Low  . Abnormal findings on diagnostic imaging of lung 12/15/2019  . Shortness of breath 12/15/2019  . Drug-induced pneumonitis 09/19/2019  . Encounter for antineoplastic immunotherapy 03/29/2019  . Chemotherapy-induced thrombocytopenia  02/20/2019  . Encounter for antineoplastic chemotherapy 01/06/2019  . Goals of care, counseling/discussion 01/06/2019  . Laryngopharyngeal reflux (LPR) 09/12/2018  . Cough 09/12/2018  . Disorientation 02/10/2018  . Overweight 08/09/2017   Past Surgical History:  Procedure Laterality Date  . BIOPSY  01/09/2020   Procedure: BIOPSY;  Surgeon: Laurin Coder, MD;  Location: [facility];  Service: Endoscopy;;  . BRONCHIAL WASHINGS  01/09/2020   Procedure: BRONCHIAL WASHINGS;  Surgeon: Laurin Coder, MD;  Location: [facility];  Service: Endoscopy;;  . CATARACT EXTRACTION W/ INTRAOCULAR LENS IMPLANT     right  . COLONOSCOPY    . IR IMAGING GUIDED PORT INSERTION  01/31/2019  . RADIOFREQUENCY ABLATION     x2 for fib, on 2nd attempt switched form a flutter ot a fib.   Marland Kitchen ROTATOR CUFF REPAIR Right   . TONSILLECTOMY    . VIDEO BRONCHOSCOPY N/A 01/09/2020   Procedure: VIDEO BRONCHOSCOPY WITH FLUORO;  Surgeon: Laurin Coder, MD;  Location: [facility];  Service: Endoscopy;  Laterality: N/A;  . VIDEO BRONCHOSCOPY WITH ENDOBRONCHIAL ULTRASOUND Left 12/30/2018   Procedure: VIDEO BRONCHOSCOPY WITH ENDOBRONCHIAL ULTRASOUND WITH FLUOROSCOPY;  Surgeon: Laurin Coder, MD;  Location: [place];  Service: Thoracic;  Laterality: Left;  . WISDOM TOOTH EXTRACTION      Family History  Problem Relation Age of Onset  . Osteoporosis Mother   . Dementia Mother   . Lung cancer Father        former smoker  .  Colon cancer Neg Hx   . Stomach cancer Neg Hx   . Esophageal cancer Neg Hx   . Rectal cancer Neg Hx     Medications- reviewed and updated Current Outpatient Medications  Medication Sig Dispense Refill  . atenolol (TENORMIN) 50 MG tablet Take 1 tablet (50 mg total) by mouth 2 (two) times daily. 180 tablet 3  . clobetasol (TEMOVATE) 0.05 % external solution Apply 1 application topically 2 (two) times daily. For seborrheic dermatitis- 7 days maximum (Patient taking differently: Apply 1  application topically 2 (two) times daily as needed (scalp itch). ) 50 mL 0  . Multiple Vitamin (MULTIVITAMIN WITH MINERALS) TABS tablet Take 1 tablet by mouth daily.    . rivaroxaban (XARELTO) 20 MG TABS tablet Take 20 mg by mouth daily.    Marland Kitchen triamcinolone cream (KENALOG) 0.5 % Apply topically 2 (two) times daily as needed. (Patient taking differently: Apply 1 application topically 2 (two) times daily as needed (rash). ) 30 g 1   No current facility-administered medications for this visit.    Allergies-reviewed and updated Allergies  Allergen Reactions  . Hydrocodone Other (See Comments)    Lost my appetite, loss of energy     Social History   Social History Narrative   Widower-wife died 38. 1 daughter. 2 grandkids. 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       Hobbies: golf, bridge, computer, sports, woodworking   Objective  Objective:  BP 102/70   Pulse 63   Temp 98.7 F (37.1 C) (Temporal)   Ht 5\' 10"  (1.778 m)   Wt 201 lb 3.2 oz (91.3 kg)   SpO2 95%   BMI 28.87 kg/m  Gen: NAD, resting comfortably HEENT: Mucous membranes are moist. Oropharynx normal Neck: no thyromegaly CV: Irregularly irregular no murmurs rubs or gallops Lungs: CTAB no crackles, wheeze, rhonchi Abdomen: soft/nontender/nondistended/normal bowel sounds. No rebound or guarding.  Ext: no edema Skin: warm, dry Neuro: grossly normal, moves all extremities, PERRLA    Assessment and Plan  79 y.o. male presenting for annual physical.  Health Maintenance counseling: 1. Anticipatory guidance: Patient counseled regarding regular dental exams -q6 months, eye exams -yearly advised,  avoiding smoking and second hand smoke , limiting alcohol to 2 beverages per day - about 4 a day at present- recommended quitting.   2. Risk factor reduction:  Advised patient of need for regular exercise and  diet rich and fruits and vegetables to reduce risk of heart attack and stroke. Exercise- limited by shortness of breath. Diet- reasonably healthy diet- maintaining weight pretty stable.  Wt Readings from Last 3 Encounters:  02/29/20 201 lb 3.2 oz (91.3 kg)  02/21/20 195 lb (88.5 kg)  02/07/20 195 lb 9.6 oz (88.7 kg)  3. Immunizations/screenings/ancillary studies- fully up to date Immunization History  Administered Date(s) Administered  . Fluad Quad(high Dose 65+) 01/16/2019  . Influenza Nasal 01/07/2020  . Influenza Split 12/22/2011  . Influenza Whole 01/18/2008, 01/04/2009  . Influenza, High Dose Seasonal PF 01/02/2016, 01/21/2017, 02/10/2018  . Influenza,inj,Quad PF,6+ Mos 12/23/2012, 02/20/2014, 12/21/2014  . Moderna SARS-COVID-2 Vaccination 04/29/2019, 05/27/2019, 01/31/2020  . Pneumococcal Conjugate-13 06/21/2014  . Pneumococcal Polysaccharide-23 01/18/2008  . Td 11/21/2001  . Tdap 12/22/2011  . Zoster 01/18/2008  . Zoster Recombinat (Shingrix) 02/03/2018, 04/07/2018  4. Prostate cancer screening- past age based screening recommendations  Lab Results  Component Value  Date   PSA 0.44 11/25/2011   PSA 0.38 04/11/2009   PSA 0.38 07/19/2008   5. Colon cancer screening -  07/27/2017 with polyps that needed to be removed- with cancer history we had postponed this and then shortness of breath issues. We will go ahead and refer back to [facility] given SOB and lung cancer sability From Prior notes from GI" are not cancerous, but all are of the precancerous types. The one I biopsied on the ICV does need to be removed, as does the larger one in the transverse colon. I had told him after the procedure that if that was the case, that I would send him to a faculty GI doctor at [facility] since they will both be challenging to removed due to their location and size (respectively).  Please send referral to Dr. Ivor Messier at [place]. His assistant is Nira Conn, [phone], she can give fax number and  required referral info. Please make sure they receive my colonoscopy report and the the pathology report." 6. Skin cancer screening- no recent visits. advised regular sunscreen use. Denies worrisome, changing, or new skin lesions.  72. Former smoker- known lung cancer 8. STD screening - not active at moment  Status of chronic or acute concerns   # stage III squamous cell carcinoma of the lungs S:  diagnosed October 2020-status post course of concurrent chemoradiation with 5 cycles of chemotherapy.  Did develop pancytopenia.  - with chemoradiation- Patient had partial response with initial treatment and underwent consolidation treatment with immunotherapy Imfinzi - plan through January 2021 most likely - also dealing with pneumonitis possibly related to the imfinzi - has had steroid responsive cough but ongoing SOB as noted below which started acutely- see that section A/P: lung cancer actually seems to be getting favorable reports- will continue follow up with oncology  #Persistent atrial fibrillation S: Rate controlled with atenolol 50mg  Anticoagulated with Xarelto 20mg   A/P: Stable. Continue current medications.   #hypertension S: medication:atenolol 50mg   Home readings 107-120/70s A/P:  Stable. Continue current medications.    #hyperlipidemia S: Medication:None. prior Holding off on treatment until direction with lung cancer more clear Lab Results  Component Value Date   CHOL 210 (H) 02/19/2020   HDL 67 02/19/2020   LDLCALC 125 (H) 02/19/2020   LDLDIRECT 91.0 02/09/2017   TRIG 101 02/19/2020   CHOLHDL 3.1 02/19/2020   The 10-year ASCVD risk score Mikey Bussing DC Jr., et al., 2013) is: 21%   Values used to calculate the score:     Age: 33 years     Sex: Male     Is Non-Hispanic African American: No     Diabetic: No     Tobacco smoker: No     Systolic Blood Pressure: 742 mmHg     Is BP treated: Yes     HDL Cholesterol: 67 mg/dL     Total Cholesterol: 210 mg/dL   A/P: We  discussed with stage III lung cancer situation being more stable potentially starting statin-since he has upcoming coronary calcium scoring he would like to wait and see results of this prior to starting statin   # Depression-his main desire is for companionship but does not believe this will happen S: Medication:None.  -did see psychiatry in the past but does not want to return at this time - Medications not beneficial in the past.   -Also has done counseling and did not find this particularly effective -Low testosterone previously treated and did not help with depression  either -Activities   -(declines senior center- no good fits, discussed possible [place] [facility])  --does enjoy bridge 4x a week online A/P: PHQ-2 actually at 0 today-patient states he has essentially accepted his current state-limited companionship, alcohol use, struggling with medical conditions-she does not want further treatment at this time  #Alcoholism-currently drinking 3-4 beverages per day but he is not interested in changing.  Strongly encourage cessation but once again not interested-also discussed could cause alcoholic cardiomyopathy  #Mildly elevated TSH-looks good on last check and has been followed by oncology  Lab Results  Component Value Date   TSH 1.959 01/08/2020   #Shortness of breath/fatigue -Acute change happen back in September where patient went from being on the treadmill 45 minutes 2 days prior to suddenly not being able to do many of the activities around his home without significant shortness of breath -Patient with extensive work-up in the past.  CT angiogram to evaluate the lungs but also rule out blood clot despite being on anticoagulant-potential multifocal infection versus pneumonitis drug-induced.  Patient responded some to steroids in regards to cough and shortness of breath-repeat CT imaging about a month later was improved -Bronchoscopy without significant findings -Has had  follow-up with pulmonology.  Does not appear exercise oximetry was completed-discussed reaching out to them today but patient states firmly that he would not want to have oxygen at this time so opts not to proceed with that. -PFT results pending -Patient referred to cardiology-some calcium on coronary arteries on prior CT scans. Stress test low risk. Pending coronary artery calcium - we discussed still need results from PFTs and coronary calcium. I have heard of patients without significant findings on stress test that later had improvement in SOB when more significant narrowing found on catheterization but obiously would defer to cardiology -may need to get plugged back into pulmonology -the level of how life limiting this is would make patient question other treatments/prolonging life so this is extremely important to him to address more aggressively. Mentioned possible second opinion at [facility] perhaps- he declines for now - patient very upset today with lack of improvement over last few months. I had interpreted prior cardiology and pulmonology notes as improvement in SOB but he states prednisone only improves cough- that is not a sufficient reason to continue long-term steroids so we are going to remain off at this point -I did discuss potential effects of alcohol and recommended cessation though echocardiogram does not show obvious alcoholic cardiomyopathy  Recommended follow up: Return in about 2 months (around 05/01/2020) for follow up- or sooner if needed. Future Appointments  Date Time Provider [place]  03/05/2020 11:00 AM [facility] CCM PHARMACIST [facility]  03/12/2020  2:00 PM [facility] 1 [facility]  05/02/2020  8:00 AM [name], MD [facility]  05/10/2020  8:30 AM [facility] LAB [facility] None  05/13/2020  8:30 AM Curt Bears, MD [facility] None   Lab/Order associations:not fasting- declines labs today   ICD-10-CM   1. Preventative health care  Z00.00    2. Essential hypertension  I10   3. Hyperlipidemia, unspecified hyperlipidemia type  E78.5   4. Polyp of colon, unspecified part of colon, unspecified type  K63.5 Ambulatory referral to Gastroenterology  5. Stage III squamous cell carcinoma of right lung (HCC)  C34.91   6. Pneumonitis, interstitial ([place])  J84.89   7. Longstanding persistent atrial fibrillation (HCC)  I48.11   8. Alcoholism ([place])  F10.20     Return precautions advised.  Annie Main  Yong Channel, MD

## 2020-02-29 ENCOUNTER — Ambulatory Visit (INDEPENDENT_AMBULATORY_CARE_PROVIDER_SITE_OTHER): Payer: Medicare Other | Admitting: Family Medicine

## 2020-02-29 ENCOUNTER — Encounter (HOSPITAL_COMMUNITY): Payer: Medicare Other

## 2020-02-29 ENCOUNTER — Other Ambulatory Visit: Payer: Self-pay

## 2020-02-29 ENCOUNTER — Encounter: Payer: Self-pay | Admitting: Family Medicine

## 2020-02-29 VITALS — BP 102/70 | HR 63 | Temp 98.7°F | Ht 70.0 in | Wt 201.2 lb

## 2020-02-29 DIAGNOSIS — I10 Essential (primary) hypertension: Secondary | ICD-10-CM | POA: Diagnosis not present

## 2020-02-29 DIAGNOSIS — Z0000 Encounter for general adult medical examination without abnormal findings: Secondary | ICD-10-CM | POA: Diagnosis not present

## 2020-02-29 DIAGNOSIS — R7989 Other specified abnormal findings of blood chemistry: Secondary | ICD-10-CM

## 2020-02-29 DIAGNOSIS — J8489 Other specified interstitial pulmonary diseases: Secondary | ICD-10-CM

## 2020-02-29 DIAGNOSIS — E785 Hyperlipidemia, unspecified: Secondary | ICD-10-CM | POA: Diagnosis not present

## 2020-02-29 DIAGNOSIS — Z87891 Personal history of nicotine dependence: Secondary | ICD-10-CM

## 2020-02-29 DIAGNOSIS — E349 Endocrine disorder, unspecified: Secondary | ICD-10-CM

## 2020-02-29 DIAGNOSIS — F1020 Alcohol dependence, uncomplicated: Secondary | ICD-10-CM

## 2020-02-29 DIAGNOSIS — C3491 Malignant neoplasm of unspecified part of right bronchus or lung: Secondary | ICD-10-CM | POA: Diagnosis not present

## 2020-02-29 DIAGNOSIS — F3342 Major depressive disorder, recurrent, in full remission: Secondary | ICD-10-CM

## 2020-02-29 DIAGNOSIS — K635 Polyp of colon: Secondary | ICD-10-CM

## 2020-02-29 DIAGNOSIS — K219 Gastro-esophageal reflux disease without esophagitis: Secondary | ICD-10-CM

## 2020-02-29 DIAGNOSIS — I4811 Longstanding persistent atrial fibrillation: Secondary | ICD-10-CM

## 2020-03-01 NOTE — Progress Notes (Signed)
Chronic Care Management Pharmacy Assistant   Name: Maricela Kawahara  MRN: [id number] DOB: [birth year]  Reason for Encounter: Medication Review / Initial Visit   [name],  79 y.o. , male presents for their Initial CCM visit with the clinical pharmacist via telephone.  PCP : [name], MD  Allergies:   Allergies  Allergen Reactions  . Hydrocodone Other (See Comments)    Lost my appetite, loss of energy     Medications: Outpatient Encounter Medications as of 03/05/2020  Medication Sig Note  . atenolol (TENORMIN) 50 MG tablet Take 1 tablet (50 mg total) by mouth 2 (two) times daily.   . clobetasol (TEMOVATE) 0.05 % external solution Apply 1 application topically 2 (two) times daily. For seborrheic dermatitis- 7 days maximum (Patient taking differently: Apply 1 application topically 2 (two) times daily as needed (scalp itch). )   . Multiple Vitamin (MULTIVITAMIN WITH MINERALS) TABS tablet Take 1 tablet by mouth daily.   . rivaroxaban (XARELTO) 20 MG TABS tablet Take 20 mg by mouth daily. 01/09/2020: Last dose 01/03/20  . triamcinolone cream (KENALOG) 0.5 % Apply topically 2 (two) times daily as needed. (Patient taking differently: Apply 1 application topically 2 (two) times daily as needed (rash). )    No facility-administered encounter medications on file as of 03/05/2020.    Current Diagnosis: Patient Active Problem List   Diagnosis Date Noted  . Abnormal findings on diagnostic imaging of lung 12/15/2019  . Shortness of breath 12/15/2019  . Drug-induced pneumonitis 09/19/2019  . Pneumonitis, interstitial ([place]) 06/29/2019  . Encounter for antineoplastic immunotherapy 03/29/2019  . Chemotherapy-induced thrombocytopenia 02/20/2019  . Stage III squamous cell carcinoma of right lung ([place]) 01/06/2019  . Encounter for antineoplastic chemotherapy 01/06/2019  . Goals of care, counseling/discussion 01/06/2019  . Laryngopharyngeal reflux (LPR) 09/12/2018  . Cough  09/12/2018  . Disorientation 02/10/2018  . Overweight 08/09/2017  . Elevated TSH 09/21/2014  . Former smoker 06/21/2014  . Alcoholism ([place]) 06/21/2014  . Allergic rhinitis 04/06/2014  . GERD (gastroesophageal reflux disease) 04/06/2014  . Hx of adenomatous colonic polyps 03/05/2014  . Chronic anticoagulation 03/05/2014  . Testosterone deficiency 09/02/2010  . Seborrheic dermatitis 07/19/2007  . Hyperlipidemia 03/04/2007  . Major depression ([place]) 03/04/2007  . Essential hypertension 03/04/2007  . Atrial fibrillation ([place]) 03/04/2007    Have you seen any other providers since your last visit?             Patient stated he has not seen any other providers  Any changes in your medications or health?              Patient stated no changes in medications or health.   Any side effects from any medications?             Patient stated no side effects from any medications or health.  Do you have an symptoms or problems not managed by your medications?             Patient stated  No symptoms or problems not managed by medications.  Any concerns about your health right now?             Patient stated no concerns about health at this time.   Has your provider asked that you check blood pressure, blood sugar, or follow special diet at home?             Patient states he checks his blood pressure every morning  Do you get  any type of exercise on a regular basis?         Patient stated not at this moment but hopes to return to gym in near future.  Can you think of a goal you would like to reach for your health?           Patient stated not at this moment.  Do you have any problems getting your medications?          Patient stated no problems getting medications.  Is there anything that you would like to discuss during the appointment?         Patient stated not at this moment.   Please bring medications and supplements to appointment  Georgiana Shore ,[facility] Clinical Pharmacist  Assistant [phone]  Follow-Up:  Pharmacist Review

## 2020-03-04 ENCOUNTER — Telehealth: Payer: Self-pay

## 2020-03-04 NOTE — Progress Notes (Signed)
Chronic Care Management Pharmacy  Name: Essie Gehret    MRN: [id number]  DOB: 23-Jul-1940  Chief Complaint/ HPI Jamelle Rushing Troutman, 79 y.o., male, presents for their initial CCM visit with the clinical pharmacist via telephone due to COVID-19 pandemic .  PCP : [name], MD Encounter Diagnoses  Name Primary?  . Essential hypertension Yes  . Longstanding persistent atrial fibrillation ([place])   . Hyperlipidemia, unspecified hyperlipidemia type     Office Visits:  02/29/2020 (PCP): discussed statin therapy with stage III lung cancer more stable. Upcoming CAC - will await results prior to starting. Medications have not been helpful with depression in past.  Patient Active Problem List   Diagnosis Date Noted  . Abnormal findings on diagnostic imaging of lung 12/15/2019  . Shortness of breath 12/15/2019  . Drug-induced pneumonitis 09/19/2019  . Pneumonitis, interstitial ([place]) 06/29/2019  . Encounter for antineoplastic immunotherapy 03/29/2019  . Chemotherapy-induced thrombocytopenia 02/20/2019  . Stage III squamous cell carcinoma of right lung ([place]) 01/06/2019  . Encounter for antineoplastic chemotherapy 01/06/2019  . Goals of care, counseling/discussion 01/06/2019  . Laryngopharyngeal reflux (LPR) 09/12/2018  . Cough 09/12/2018  . Disorientation 02/10/2018  . Overweight 08/09/2017  . Elevated TSH 09/21/2014  . Former smoker 06/21/2014  . Alcoholism ([place]) 06/21/2014  . Allergic rhinitis 04/06/2014  . GERD (gastroesophageal reflux disease) 04/06/2014  . Hx of adenomatous colonic polyps 03/05/2014  . Chronic anticoagulation 03/05/2014  . Testosterone deficiency 09/02/2010  . Seborrheic dermatitis 07/19/2007  . Hyperlipidemia 03/04/2007  . Major depression ([place]) 03/04/2007  . Essential hypertension 03/04/2007  . Atrial fibrillation ([place]) 03/04/2007   Past Surgical History:  Procedure Laterality Date  . BIOPSY  01/09/2020   Procedure: BIOPSY;  Surgeon: Laurin Coder, MD;  Location: [facility];  Service: Endoscopy;;  . BRONCHIAL WASHINGS  01/09/2020   Procedure: BRONCHIAL WASHINGS;  Surgeon: Laurin Coder, MD;  Location: [facility];  Service: Endoscopy;;  . CATARACT EXTRACTION W/ INTRAOCULAR LENS IMPLANT     right  . COLONOSCOPY    . IR IMAGING GUIDED PORT INSERTION  01/31/2019  . RADIOFREQUENCY ABLATION     x2 for fib, on 2nd attempt switched form a flutter ot a fib.   Marland Kitchen ROTATOR CUFF REPAIR Right   . TONSILLECTOMY    . VIDEO BRONCHOSCOPY N/A 01/09/2020   Procedure: VIDEO BRONCHOSCOPY WITH FLUORO;  Surgeon: Laurin Coder, MD;  Location: [facility];  Service: Endoscopy;  Laterality: N/A;  . VIDEO BRONCHOSCOPY WITH ENDOBRONCHIAL ULTRASOUND Left 12/30/2018   Procedure: VIDEO BRONCHOSCOPY WITH ENDOBRONCHIAL ULTRASOUND WITH FLUOROSCOPY;  Surgeon: Laurin Coder, MD;  Location: [facility];  Service: Thoracic;  Laterality: Left;  . WISDOM TOOTH EXTRACTION     Family History  Problem Relation Age of Onset  . Osteoporosis Mother   . Dementia Mother   . Lung cancer Father        former smoker  . Colon cancer Neg Hx   . Stomach cancer Neg Hx   . Esophageal cancer Neg Hx   . Rectal cancer Neg Hx    Allergies  Allergen Reactions  . Hydrocodone Other (See Comments)    Lost my appetite, loss of energy    Outpatient Encounter Medications as of 03/05/2020  Medication Sig Note  . atenolol (TENORMIN) 50 MG tablet Take 1 tablet (50 mg total) by mouth 2 (two) times daily.   . rivaroxaban (XARELTO) 20 MG TABS tablet Take 20 mg by mouth daily. 01/09/2020: Last dose 01/03/20  .  clobetasol (TEMOVATE) 0.05 % external solution Apply 1 application topically 2 (two) times daily. For seborrheic dermatitis- 7 days maximum (Patient taking differently: Apply 1 application topically 2 (two) times daily as needed (scalp itch). )   . Multiple Vitamin (MULTIVITAMIN WITH MINERALS) TABS tablet Take 1 tablet by mouth daily.   Marland Kitchen triamcinolone cream (KENALOG)  0.5 % Apply topically 2 (two) times daily as needed. (Patient taking differently: Apply 1 application topically 2 (two) times daily as needed (rash). )    No facility-administered encounter medications on file as of 03/05/2020.   Patient Care Team    Relationship Specialty Notifications Start End  [name], MD PCP - General Family Medicine  03/05/14   [place] [facility]  02/10/18   Valrie Hart, RN Oncology Nurse Navigator  Admissions 01/13/19   Madelin Rear, [facility] Pharmacist Pharmacist  11/08/19    Comment: [phone]   Current Diagnosis/Assessment: Goals Addressed            This Visit's Progress   . PharmD Care Plan       CARE PLAN ENTRY (see longitudinal plan of care for additional care plan information)  Current Barriers:  . Chronic Disease Management support, education, and care coordination needs related to Hypertension, Hyperlipidemia, and Atrial Fibrillation   Hypertension BP Readings from Last 3 Encounters:  02/29/20 102/70  02/07/20 122/78  02/01/20 136/70   . Pharmacist Clinical Goal(s): o Over the next 365 days, patient will work with PharmD and providers to maintain BP goal <130/80 . Current regimen:  o Atenolol 50 mg twice daily (heart rate control in atrial fibrillation, blood pressure) . Interventions: o We reviewed diet and exercise - Maintain a healthy weight and exercise regularly, as directed by your health care provider. Eat healthy foods, such as: Lean proteins, complex carbohydrates, fresh fruits and vegetables, low-fat dairy products, healthy fats. . Patient self care activities - Over the next 365 days, patient will: o Check BP at least once every 1-2 weeks, document, and provide at future appointments o Ensure daily salt intake < 2300 mg/day  Hyperlipidemia Lab Results  Component Value Date/Time   LDLCALC 125 (H) 02/19/2020 08:45 AM   LDLDIRECT 91.0 02/09/2017 08:55 AM   . Pharmacist Clinical  Goal(s): o Over the next 90 days, patient will work with PharmD and providers to achieve LDL goal < 70 . Current regimen:  o No medications at present - awaiting coronary artery calcification results  . Interventions: o Reviewed side effects of statin medications. . Patient self care activities - Over the next 365 days, patient will: o Reach out with any questions  Stroke prevention in atrial fibrillation . Pharmacist Clinical Goal(s) o Over the next 365 days, patient will work with PharmD and providers to ensure medication affordability and accessibility . Current regimen:  o Xarelto 20 mg once every night with supper . Interventions: o Review of side effects including bleed risk - no problems noted. o Review of patient assistance - declined current need . Patient self care activities - Over the next 365 days, patient will: o Continue current management  Medication management . Pharmacist Clinical Goal(s): o Over the next 365 days, patient will work with PharmD and providers to maintain optimal medication adherence . Current pharmacy: [facility] . Interventions o Comprehensive medication review performed. o Continue current medication management strategy . Patient self care activities - Over the next 365 days, patient will: o Take medications as prescribed o Report any questions or concerns  to PharmD and/or provider(s) Initial goal documentation.      Hypertension   BP goal <130/80  BP Readings from Last 3 Encounters:  02/29/20 102/70  02/07/20 122/78  02/01/20 136/70   Previous medications: amlodipine (stopped due to low bp).  Current weight ~198, would like to get closer to 190 lb. Patient checks BP at home several times per month. Recent home readings: 113/72. Would previously exercise at fitness center several times per week. Limited recently with symptoms of fatigue. Patient is currently at goal on the following medications:  . Atenolol 50 mg twice daily   We  reviewed diet and exercise - Maintain a healthy weight and exercise regularly, as directed by your health care provider. Eat healthy foods, such as: Lean proteins, complex carbohydrates, fresh fruits and vegetables, low-fat dairy products, healthy fats.  Plan  Continue current medications.  Hyperlipidemia   LDL goal < 70  Lipid Panel     Component Value Date/Time   CHOL 210 (H) 02/19/2020 0845   TRIG 101 02/19/2020 0845   HDL 67 02/19/2020 0845   LDLCALC 125 (H) 02/19/2020 0845   LDLDIRECT 91.0 02/09/2017 0855    Hepatic Function Latest Ref Rng & Units 01/05/2020 12/20/2019 10/05/2019  Total Protein 6.5 - 8.1 g/dL 6.9 6.6 6.2(L)  Albumin 3.5 - 5.0 g/dL 3.7 3.2(L) 3.4(L)  AST 15 - 41 U/L 30 29 37  ALT 0 - 44 U/L 47(H) 27 52(H)  Alk Phosphatase 38 - 126 U/L 69 69 73  Total Bilirubin 0.3 - 1.2 mg/dL 0.6 0.5 0.5  Bilirubin, Direct 0.0 - 0.3 mg/dL - - -    The 10-year ASCVD risk score Mikey Bussing DC Jr., et al., 2013) is: 21%   Values used to calculate the score:     Age: 36 years     Sex: Male     Is Non-Hispanic African American: No     Diabetic: No     Tobacco smoker: No     Systolic Blood Pressure: 222 mmHg     Is BP treated: Yes     HDL Cholesterol: 67 mg/dL     Total Cholesterol: 210 mg/dL   Patient has failed these meds in past: n/a Awaiting CAC to help guide cholesterol lowering therapy. Pt agreeable to starting statin therapy if recommended. Patient is currently not at goal the following medications:  . No medications at this time  We discussed diet and exercise extensively. Reviewed side effects of statin medications.  Plan  Pt to receive CAC 03/12/2020.  AFIB   Pulse Readings from Last 3 Encounters:  02/29/20 63  02/07/20 89  02/01/20 77   Patient is currently rate controlled on the following medications:  . Atenolol 50 mg once daily   Previous medications: Pradaxa. Prevention of stroke in Afib. CHA2DS2/VAS = 3. Denies any abnormal bruising, bleeding from  nose or gums or blood in urine or stool. Patient is currently controlled on the following medications:  . Xarelto 20 mg once daily   Patient assistance review - declined, not currently needed.    Plan  Continue current medications.  GERD   Patient denies recent acid reflux.  Currently controlled on: . No medications at present  We discussed: Avoidance of potential triggers such as alcohol, fatty foods, lying down after eating, and tomato sauce.  Plan   Continue current medications.  Vaccines   Immunization History  Administered Date(s) Administered  . Fluad Quad(high Dose 65+) 01/16/2019  . Influenza Nasal 01/07/2020  .  Influenza Split 12/22/2011  . Influenza Whole 01/18/2008, 01/04/2009  . Influenza, High Dose Seasonal PF 01/02/2016, 01/21/2017, 02/10/2018  . Influenza,inj,Quad PF,6+ Mos 12/23/2012, 02/20/2014, 12/21/2014  . Moderna SARS-COVID-2 Vaccination 04/29/2019, 05/27/2019, 01/31/2020  . Pneumococcal Conjugate-13 06/21/2014  . Pneumococcal Polysaccharide-23 01/18/2008  . Td 11/21/2001  . Tdap 12/22/2011  . Zoster 01/18/2008  . Zoster Recombinat (Shingrix) 02/03/2018, 04/07/2018   Reviewed and discussed patient's vaccination history.    Plan  No recommendations at this time.   Medication Management / Care Coordination   Receives prescription medications from:  [place], [place] - [address] [place] [place] [address] Phone: [phone] Fax: [phone]  [facility] ORDER) [place], [place] [address] Phone: [phone] Fax: [phone]  [place], [address] [place], [address] [place], [place] [address] Phone: [phone] Fax: [phone]   Denies any issues with current medication management.   Plan  Continue current medication management  strategy. ___________________________ SDOH (Social Determinants of Health) assessments performed: Yes.  Future Appointments  Date Time Provider [place]  03/12/2020  2:00 PM LBCT-CT 1 LBCT-CT LB-CT [facility]  05/02/2020  8:00 AM [name], MD [facility]PC-HPC PEC  05/10/2020  8:30 AM CH[facility]-MED-ONC LAB [facility]-MEDONC None  05/13/2020  8:30 AM Curt Bears, MD [facility] None  01/07/2021  1:30 PM LBPC-HPC CCM PHARMACIST [facility]-HPC PEC   Visit follow-up:  . RPH follow-up: 9-12 month telephone visit. Review need for starting statin therapy, bp, exercise if able to tolerate.  Madelin Rear, Pharm.D., BCGP Clinical Pharmacist [facility] [phone]

## 2020-03-04 NOTE — Progress Notes (Unsigned)
    Chronic Care Management Pharmacy Assistant   Name: Swade Shonka  MRN: [id number] DOB: [birth year]  Reason for Encounter: {Follow-Up Reason:24148}  Patient Questions:  1.  Have you seen any other providers since your last visit? {CHL THN UPSTREAM YES/NO:24149}  2.  Any changes in your medicines or health? {CHL THN UPSTREAM YES/NO:24149}   Martin Bailey,  79 y.o. , male presents for their {Initial/Follow-up:3041532} CCM visit with the clinical pharmacist {CHL HP Upstream Pharm visit RSWN:[id number]}.  PCP : [name], MD  Allergies:   Allergies  Allergen Reactions  . Hydrocodone Other (See Comments)    Lost my appetite, loss of energy     Medications: Outpatient Encounter Medications as of 03/04/2020  Medication Sig Note  . atenolol (TENORMIN) 50 MG tablet Take 1 tablet (50 mg total) by mouth 2 (two) times daily.   . clobetasol (TEMOVATE) 0.05 % external solution Apply 1 application topically 2 (two) times daily. For seborrheic dermatitis- 7 days maximum (Patient taking differently: Apply 1 application topically 2 (two) times daily as needed (scalp itch). )   . Multiple Vitamin (MULTIVITAMIN WITH MINERALS) TABS tablet Take 1 tablet by mouth daily.   . rivaroxaban (XARELTO) 20 MG TABS tablet Take 20 mg by mouth daily. 01/09/2020: Last dose 01/03/20  . triamcinolone cream (KENALOG) 0.5 % Apply topically 2 (two) times daily as needed. (Patient taking differently: Apply 1 application topically 2 (two) times daily as needed (rash). )    No facility-administered encounter medications on file as of 03/04/2020.    Current Diagnosis: Patient Active Problem List   Diagnosis Date Noted  . Abnormal findings on diagnostic imaging of lung 12/15/2019  . Shortness of breath 12/15/2019  . Drug-induced pneumonitis 09/19/2019  . Pneumonitis, interstitial ([place]) 06/29/2019  . Encounter for antineoplastic immunotherapy 03/29/2019  . Chemotherapy-induced thrombocytopenia  02/20/2019  . Stage III squamous cell carcinoma of right lung ([place]) 01/06/2019  . Encounter for antineoplastic chemotherapy 01/06/2019  . Goals of care, counseling/discussion 01/06/2019  . Laryngopharyngeal reflux (LPR) 09/12/2018  . Cough 09/12/2018  . Disorientation 02/10/2018  . Overweight 08/09/2017  . Elevated TSH 09/21/2014  . Former smoker 06/21/2014  . Alcoholism ([place]) 06/21/2014  . Allergic rhinitis 04/06/2014  . GERD (gastroesophageal reflux disease) 04/06/2014  . Hx of adenomatous colonic polyps 03/05/2014  . Chronic anticoagulation 03/05/2014  . Testosterone deficiency 09/02/2010  . Seborrheic dermatitis 07/19/2007  . Hyperlipidemia 03/04/2007  . Major depression ([place]) 03/04/2007  . Essential hypertension 03/04/2007  . Atrial fibrillation ([place]) 03/04/2007    Goals Addressed   None     Follow-Up:  {Upstream CPA Follow-up:24147}

## 2020-03-05 ENCOUNTER — Ambulatory Visit: Payer: Medicare Other

## 2020-03-05 DIAGNOSIS — E785 Hyperlipidemia, unspecified: Secondary | ICD-10-CM

## 2020-03-05 DIAGNOSIS — I10 Essential (primary) hypertension: Secondary | ICD-10-CM

## 2020-03-05 DIAGNOSIS — I4811 Longstanding persistent atrial fibrillation: Secondary | ICD-10-CM

## 2020-03-05 NOTE — Patient Instructions (Addendum)
Martin Bailey,  Thank you for taking the time to review your medications with me today. I have put together a summary today's visit here and included a cholesterol handout for your review.   Please call me at [phone] with any questions.  Thank you, [name], Pharm.D., BCGP Clinical Pharmacist [place] [phone]  Goals Addressed            This Visit's Progress   . PharmD Care Plan       CARE PLAN ENTRY (see longitudinal plan of care for additional care plan information)  Current Barriers:  . Chronic Disease Management support, education, and care coordination needs related to Hypertension, Hyperlipidemia, and Atrial Fibrillation   Hypertension BP Readings from Last 3 Encounters:  02/29/20 102/70  02/07/20 122/78  02/01/20 136/70   . Pharmacist Clinical Goal(s): o Over the next 365 days, patient will work with PharmD and providers to maintain BP goal <130/80 . Current regimen:  o Atenolol 50 mg twice daily (heart rate control in atrial fibrillation, blood pressure) . Interventions: o We reviewed diet and exercise - Maintain a healthy weight and exercise regularly, as directed by your health care provider. Eat healthy foods, such as: Lean proteins, complex carbohydrates, fresh fruits and vegetables, low-fat dairy products, healthy fats. . Patient self care activities - Over the next 365 days, patient will: o Check BP at least once every 1-2 weeks, document, and provide at future appointments o Ensure daily salt intake < 2300 mg/day  Hyperlipidemia Lab Results  Component Value Date/Time   LDLCALC 125 (H) 02/19/2020 08:45 AM   LDLDIRECT 91.0 02/09/2017 08:55 AM   . Pharmacist Clinical Goal(s): o Over the next 90 days, patient will work with PharmD and providers to achieve LDL goal < 70 . Current regimen:  o No medications at present - awaiting coronary artery calcification results  . Interventions: o Reviewed side effects of statin  medications. . Patient self care activities - Over the next 365 days, patient will: o Reach out with any questions  Stroke prevention in atrial fibrillation . Pharmacist Clinical Goal(s) o Over the next 365 days, patient will work with PharmD and providers to ensure medication affordability and accessibility . Current regimen:  o Xarelto 20 mg once every night with supper . Interventions: o Review of side effects including bleed risk - no problems noted. o Review of patient assistance - declined current need . Patient self care activities - Over the next 365 days, patient will: o Continue current management  Medication management . Pharmacist Clinical Goal(s): o Over the next 365 days, patient will work with PharmD and providers to maintain optimal medication adherence . Current pharmacy: [facility] . Interventions o Comprehensive medication review performed. o Continue current medication management strategy . Patient self care activities - Over the next 365 days, patient will: o Take medications as prescribed o Report any questions or concerns to PharmD and/or provider(s) Initial goal documentation.      The patient verbalized understanding of instructions provided today and agreed to receive a mailed copy of patient instruction and/or educational materials. Telephone follow up appointment with pharmacy team member scheduled for: See next appointment with "Care Management Staff" under "What's Next" below.   Madelin Rear, Pharm.D., BCGP Clinical Pharmacist [facility] [phone]  High Cholesterol  High cholesterol is a condition in which the blood has high levels of a white, waxy, fat-like substance (cholesterol). The human body needs small amounts of  cholesterol. The liver makes all the cholesterol that the body needs. Extra (excess) cholesterol comes from the food that we eat. Cholesterol is carried from the liver by the blood through the blood vessels. If you have  high cholesterol, deposits (plaques) may build up on the walls of your blood vessels (arteries). Plaques make the arteries narrower and stiffer. Cholesterol plaques increase your risk for heart attack and stroke. Work with your health care provider to keep your cholesterol levels in a healthy range. What increases the risk? This condition is more likely to develop in people who:  Eat foods that are high in animal fat (saturated fat) or cholesterol.  Are overweight.  Are not getting enough exercise.  Have a family history of high cholesterol. What are the signs or symptoms? There are no symptoms of this condition. How is this diagnosed? This condition may be diagnosed from the results of a blood test.  If you are older than age 75, your health care provider may check your cholesterol every 4-6 years.  You may be checked more often if you already have high cholesterol or other risk factors for heart disease. The blood test for cholesterol measures:  "Bad" cholesterol (LDL cholesterol). This is the main type of cholesterol that causes heart disease. The desired level for LDL is less than 100.  "Good" cholesterol (HDL cholesterol). This type helps to protect against heart disease by cleaning the arteries and carrying the LDL away. The desired level for HDL is 60 or higher.  Triglycerides. These are fats that the body can store or burn for energy. The desired number for triglycerides is lower than 150.  Total cholesterol. This is a measure of the total amount of cholesterol in your blood, including LDL cholesterol, HDL cholesterol, and triglycerides. A healthy number is less than 200. How is this treated? This condition is treated with diet changes, lifestyle changes, and medicines. Diet changes  This may include eating more whole grains, fruits, vegetables, nuts, and fish.  This may also include cutting back on red meat and foods that have a lot of added sugar. Lifestyle  changes  Changes may include getting at least 40 minutes of aerobic exercise 3 times a week. Aerobic exercises include walking, biking, and swimming. Aerobic exercise along with a healthy diet can help you maintain a healthy weight.  Changes may also include quitting smoking. Medicines  Medicines are usually given if diet and lifestyle changes have failed to reduce your cholesterol to healthy levels.  Your health care provider may prescribe a statin medicine. Statin medicines have been shown to reduce cholesterol, which can reduce the risk of heart disease. Follow these instructions at home: Eating and drinking If told by your health care provider:  Eat chicken (without skin), fish, veal, shellfish, ground [place] breast, and round or loin cuts of red meat.  Do not eat fried foods or fatty meats, such as hot dogs and salami.  Eat plenty of fruits, such as apples.  Eat plenty of vegetables, such as broccoli, potatoes, and carrots.  Eat beans, peas, and lentils.  Eat grains such as barley, rice, couscous, and bulgur wheat.  Eat pasta without cream sauces.  Use skim or nonfat milk, and eat low-fat or nonfat yogurt and cheeses.  Do not eat or drink whole milk, cream, ice cream, egg yolks, or hard cheeses.  Do not eat stick margarine or tub margarines that contain trans fats (also called partially hydrogenated oils).  Do not eat saturated tropical oils,  such as coconut oil and palm oil.  Do not eat cakes, cookies, crackers, or other baked goods that contain trans fats.  General instructions  Exercise as directed by your health care provider. Increase your activity level with activities such as gardening, walking, and taking the stairs.  Take over-the-counter and prescription medicines only as told by your health care provider.  Do not use any products that contain nicotine or tobacco, such as cigarettes and e-cigarettes. If you need help quitting, ask your health care  provider.  Keep all follow-up visits as told by your health care provider. This is important. Contact a health care provider if:  You are struggling to maintain a healthy diet or weight.  You need help to start on an exercise program.  You need help to stop smoking. Get help right away if:  You have chest pain.  You have trouble breathing. This information is not intended to replace advice given to you by your health care provider. Make sure you discuss any questions you have with your health care provider. Document Revised: 03/19/2017 Document Reviewed: 09/14/2015 Elsevier Patient Education  [place].

## 2020-03-07 ENCOUNTER — Telehealth: Payer: Self-pay | Admitting: Pulmonary Disease

## 2020-03-07 NOTE — Telephone Encounter (Signed)
Called and spoke with patient, he is asking for results of PFT.  Advised that it has not been interpreted by the physician, however, I will send it to him and as soon as we hear back from him we will call him back.  He verbalized understanding.  Dr. Ander Slade,  Please advise on results of PFT from 02/27/2020.  Thank you.

## 2020-03-12 ENCOUNTER — Ambulatory Visit (INDEPENDENT_AMBULATORY_CARE_PROVIDER_SITE_OTHER)
Admission: RE | Admit: 2020-03-12 | Discharge: 2020-03-12 | Disposition: A | Discharge status: Home / Self Care | Payer: Self-pay | Source: Ambulatory Visit | Attending: Cardiology | Admitting: Cardiology

## 2020-03-12 ENCOUNTER — Other Ambulatory Visit: Payer: Self-pay

## 2020-03-12 DIAGNOSIS — R0602 Shortness of breath: Secondary | ICD-10-CM

## 2020-03-12 DIAGNOSIS — E785 Hyperlipidemia, unspecified: Secondary | ICD-10-CM

## 2020-03-12 NOTE — Telephone Encounter (Signed)
Message routed to Dr. Ander Slade to advise.  I saw the entry for my breathing test results, but as a layman I have no idea how to interpret those abbreviations and numbers.  I need an evaluation of the results that tells me if any problems were found.  If you could provide that I would very much appreciate it.  Thank you.  JRM

## 2020-03-12 NOTE — Telephone Encounter (Signed)
Breathing study reveals combination of mild obstructive disease-narrowing of the airways, restriction-in keeping with small lung volumes which one may see with having inflammation /scarring-in your situation the groundglass changes that was seen on the CT. The reduction in diffusing capacity has to do with how one is able to exchange oxygen -this was reduced.    Gives [place] a baseline of what your lung functions are at present, does not change anything at present.

## 2020-03-14 ENCOUNTER — Other Ambulatory Visit: Payer: Self-pay

## 2020-03-14 MED ORDER — ROSUVASTATIN CALCIUM 10 MG PO TABS: 10.0000 mg | ORAL_TABLET | Freq: Every day | ORAL | 3 refills | Status: DC

## 2020-03-15 NOTE — Telephone Encounter (Signed)
Martin Rocher, LPN  44/[id number]/4604 79:98 PM EST      Pt informed of providers result & recommendations. Pt verbalized understanding. Pt is asking if this is going to help his "current problem"? Pt states that he is still very fatigued, SOB and "can't even walk around the house-what are we going to do about this"??   Donato Heinz, MD  03/13/2020 10:03 PM EST      Calcium score is elevated, indicating that there is a good amount of calcified plaque in his heart arteries. Recommend starting rosuvastatin 10 mg daily.

## 2020-03-20 ENCOUNTER — Encounter: Payer: Self-pay | Admitting: Family Medicine

## 2020-03-20 DIAGNOSIS — C3491 Malignant neoplasm of unspecified part of right bronchus or lung: Secondary | ICD-10-CM

## 2020-03-20 DIAGNOSIS — J8489 Other specified interstitial pulmonary diseases: Secondary | ICD-10-CM

## 2020-03-20 NOTE — Telephone Encounter (Signed)
See pt's email from 12.13.21. Will close encounter.

## 2020-03-27 ENCOUNTER — Telehealth: Payer: Self-pay | Admitting: Family Medicine

## 2020-03-27 NOTE — Telephone Encounter (Signed)
Left message for patient to call back and schedule Medicare Annual Wellness Visit (AWV) either virtually OR in office.   Last AWV 02/16/19; please schedule at anytime with [facility] Advisor at [facility].  This should be a 45 minute visit.

## 2020-03-31 ENCOUNTER — Other Ambulatory Visit: Payer: Self-pay | Admitting: Family Medicine

## 2020-04-01 ENCOUNTER — Encounter: Payer: Self-pay | Admitting: Family Medicine

## 2020-04-02 DIAGNOSIS — R0600 Dyspnea, unspecified: Secondary | ICD-10-CM | POA: Diagnosis not present

## 2020-04-02 DIAGNOSIS — R0602 Shortness of breath: Secondary | ICD-10-CM | POA: Diagnosis not present

## 2020-04-03 DIAGNOSIS — R0602 Shortness of breath: Secondary | ICD-10-CM | POA: Diagnosis not present

## 2020-04-03 DIAGNOSIS — R918 Other nonspecific abnormal finding of lung field: Secondary | ICD-10-CM | POA: Diagnosis not present

## 2020-04-03 DIAGNOSIS — I700 Atherosclerosis of aorta: Secondary | ICD-10-CM | POA: Diagnosis not present

## 2020-04-03 DIAGNOSIS — Z87891 Personal history of nicotine dependence: Secondary | ICD-10-CM | POA: Diagnosis not present

## 2020-04-03 DIAGNOSIS — J984 Other disorders of lung: Secondary | ICD-10-CM | POA: Diagnosis not present

## 2020-04-03 DIAGNOSIS — E785 Hyperlipidemia, unspecified: Secondary | ICD-10-CM | POA: Diagnosis not present

## 2020-04-03 DIAGNOSIS — R5383 Other fatigue: Secondary | ICD-10-CM | POA: Diagnosis not present

## 2020-04-03 DIAGNOSIS — C3490 Malignant neoplasm of unspecified part of unspecified bronchus or lung: Secondary | ICD-10-CM | POA: Diagnosis not present

## 2020-04-03 DIAGNOSIS — R0683 Snoring: Secondary | ICD-10-CM | POA: Diagnosis not present

## 2020-04-03 DIAGNOSIS — J679 Hypersensitivity pneumonitis due to unspecified organic dust: Secondary | ICD-10-CM | POA: Diagnosis not present

## 2020-04-03 DIAGNOSIS — I10 Essential (primary) hypertension: Secondary | ICD-10-CM | POA: Diagnosis not present

## 2020-04-03 DIAGNOSIS — L299 Pruritus, unspecified: Secondary | ICD-10-CM | POA: Diagnosis not present

## 2020-04-03 DIAGNOSIS — Z7901 Long term (current) use of anticoagulants: Secondary | ICD-10-CM | POA: Diagnosis not present

## 2020-04-03 DIAGNOSIS — M47814 Spondylosis without myelopathy or radiculopathy, thoracic region: Secondary | ICD-10-CM | POA: Diagnosis not present

## 2020-04-09 ENCOUNTER — Telehealth: Payer: Self-pay

## 2020-04-09 NOTE — Telephone Encounter (Signed)
Patient returned call to the office. I informed him to hold his Xarelto 1 day prior to procedure. He verbalized understanding and thanked me for calling.

## 2020-04-09 NOTE — Telephone Encounter (Signed)
Patient with diagnosis of afib on Xarelto for anticoagulation.    Procedure: Colonoscopy  Date of procedure: 04/17/20    CHA2DS2-VASc Score = 4  This indicates a 4.8% annual risk of stroke. The patient's score is based upon: CHF History: No HTN History: Yes Diabetes History: No Stroke History: No Vascular Disease History: Yes Age Score: 2 Gender Score: 0    CrCl 74 ml/min  Per office protocol, patient can hold Xarelto for 1 day prior to procedure.

## 2020-04-09 NOTE — Telephone Encounter (Signed)
   Primary Cardiologist: Donato Heinz, MD  Chart reviewed as part of pre-operative protocol coverage. Given past medical history and time since last visit, based on ACC/AHA guidelines, Martin Bailey would be at acceptable risk for the planned procedure without further cardiovascular testing.  Recent nuclear stress test was negative for ischemia, normal EF.  Echocardiogram was normal as well.  His dyspnea was felt to be pulmonary in nature.  He is cleared as a low risk patient for low risk procedure.  Will forward to our clinical pharmacist to review Xarelto given prior history of chronic atrial fibrillation.  I will route this recommendation to the requesting party via Epic fax function and remove from pre-op pool.  Please call with questions.  [place], [place] 04/09/2020, 1:10 PM

## 2020-04-09 NOTE — Telephone Encounter (Signed)
Left a voice message for the patient asking to give the office a call back to discuss holding his Xarelto prior to his 04/17/20 procedure.

## 2020-04-09 NOTE — Telephone Encounter (Signed)
Clinical pharmacist to review Xarelto 

## 2020-04-09 NOTE — Telephone Encounter (Signed)
   [facility] Pre-operative Risk Assessment    HEARTCARE STAFF: - Please ensure there is not already an duplicate clearance open for this procedure. - Under Visit Info/Reason for Call, type in Other and utilize the format Clearance MM/DD/YY or Clearance TBD. Do not use dashes or single digits. - If request is for dental extraction, please clarify the # of teeth to be extracted.  Request for surgical clearance: Urgent Request   1. What type of surgery is being performed? Colonoscopy   2. When is this surgery scheduled? 04/17/20    3. What type of clearance is required (medical clearance vs. Pharmacy clearance to hold med vs. Both)? BOTH  4. Are there any medications that need to be held prior to surgery and how long? Xarelto how many days prior to procedure  5. Practice name and name of physician performing surgery? [name],   6. What is the office phone number? [phone]   7.   What is the office fax number? [phone]  8.   Anesthesia type (None, local, MAC, general) ? Not listed   Martin Bailey 04/09/2020, 11:12 AM  _________________________________________________________________   (provider comments below)

## 2020-04-09 NOTE — Telephone Encounter (Signed)
Callback pool to inform the patient he is cleared and will need to hold Xarelto for 1 day prior to the procedure.

## 2020-04-09 NOTE — Telephone Encounter (Signed)
Called the requesting office of [place] and spoke with [name]. I informed her that patient Martin Bailey has been cleared to hold his Xarelto for 1 day prior to procedure. I also let her know that I have tried calling patient to inform him of the information and that I will fax over clearance to her office. [name] confirmed that the fax number was correct.

## 2020-04-17 DIAGNOSIS — Z1211 Encounter for screening for malignant neoplasm of colon: Secondary | ICD-10-CM | POA: Diagnosis not present

## 2020-04-17 DIAGNOSIS — K635 Polyp of colon: Secondary | ICD-10-CM | POA: Diagnosis not present

## 2020-04-17 DIAGNOSIS — Z86010 Personal history of colonic polyps: Secondary | ICD-10-CM | POA: Diagnosis not present

## 2020-04-17 DIAGNOSIS — D120 Benign neoplasm of cecum: Secondary | ICD-10-CM | POA: Diagnosis not present

## 2020-04-17 DIAGNOSIS — D123 Benign neoplasm of transverse colon: Secondary | ICD-10-CM | POA: Diagnosis not present

## 2020-04-17 DIAGNOSIS — Z87891 Personal history of nicotine dependence: Secondary | ICD-10-CM | POA: Diagnosis not present

## 2020-04-17 LAB — HM COLONOSCOPY

## 2020-04-24 DIAGNOSIS — C3490 Malignant neoplasm of unspecified part of unspecified bronchus or lung: Secondary | ICD-10-CM | POA: Diagnosis not present

## 2020-04-24 DIAGNOSIS — F32A Depression, unspecified: Secondary | ICD-10-CM | POA: Diagnosis not present

## 2020-04-24 DIAGNOSIS — E785 Hyperlipidemia, unspecified: Secondary | ICD-10-CM | POA: Diagnosis not present

## 2020-04-24 DIAGNOSIS — R0602 Shortness of breath: Secondary | ICD-10-CM | POA: Diagnosis not present

## 2020-04-24 DIAGNOSIS — Z7901 Long term (current) use of anticoagulants: Secondary | ICD-10-CM | POA: Diagnosis not present

## 2020-04-24 DIAGNOSIS — Z923 Personal history of irradiation: Secondary | ICD-10-CM | POA: Diagnosis not present

## 2020-04-24 DIAGNOSIS — R530 Neoplastic (malignant) related fatigue: Secondary | ICD-10-CM | POA: Diagnosis not present

## 2020-04-24 DIAGNOSIS — L299 Pruritus, unspecified: Secondary | ICD-10-CM | POA: Diagnosis not present

## 2020-04-24 DIAGNOSIS — R918 Other nonspecific abnormal finding of lung field: Secondary | ICD-10-CM | POA: Diagnosis not present

## 2020-04-24 DIAGNOSIS — Z87891 Personal history of nicotine dependence: Secondary | ICD-10-CM | POA: Diagnosis not present

## 2020-04-24 DIAGNOSIS — Z79899 Other long term (current) drug therapy: Secondary | ICD-10-CM | POA: Diagnosis not present

## 2020-04-24 DIAGNOSIS — I4892 Unspecified atrial flutter: Secondary | ICD-10-CM | POA: Diagnosis not present

## 2020-04-24 DIAGNOSIS — I10 Essential (primary) hypertension: Secondary | ICD-10-CM | POA: Diagnosis not present

## 2020-04-24 DIAGNOSIS — I482 Chronic atrial fibrillation, unspecified: Secondary | ICD-10-CM | POA: Diagnosis not present

## 2020-04-24 DIAGNOSIS — R0683 Snoring: Secondary | ICD-10-CM | POA: Diagnosis not present

## 2020-04-24 DIAGNOSIS — C3491 Malignant neoplasm of unspecified part of right bronchus or lung: Secondary | ICD-10-CM | POA: Diagnosis not present

## 2020-04-26 DIAGNOSIS — L298 Other pruritus: Secondary | ICD-10-CM | POA: Diagnosis not present

## 2020-04-26 DIAGNOSIS — L0100 Impetigo, unspecified: Secondary | ICD-10-CM | POA: Diagnosis not present

## 2020-05-01 DIAGNOSIS — R918 Other nonspecific abnormal finding of lung field: Secondary | ICD-10-CM | POA: Diagnosis not present

## 2020-05-01 DIAGNOSIS — R0602 Shortness of breath: Secondary | ICD-10-CM | POA: Diagnosis not present

## 2020-05-01 DIAGNOSIS — C3490 Malignant neoplasm of unspecified part of unspecified bronchus or lung: Secondary | ICD-10-CM | POA: Diagnosis not present

## 2020-05-01 DIAGNOSIS — J90 Pleural effusion, not elsewhere classified: Secondary | ICD-10-CM | POA: Diagnosis not present

## 2020-05-01 DIAGNOSIS — I2510 Atherosclerotic heart disease of native coronary artery without angina pectoris: Secondary | ICD-10-CM | POA: Diagnosis not present

## 2020-05-01 NOTE — Patient Instructions (Addendum)
blood pressure controlled in office but has been poorly controlled at home. We will continue atenolol. Restart amlodipine 5 mg and asked him to update me in 2 weeks with how he is doing. Ideally at home would be <135/85 on average  We will call you within two weeks about your referral to psychiatry. If you do not hear within 2 weeks, give [place] a call.   Recommended follow up: Return in about 4 months (around 08/30/2020).

## 2020-05-01 NOTE — Progress Notes (Signed)
Phone [phone] In person visit   Subjective:   Martin Bailey is a 80 y.o. year old very pleasant male patient who presents for/with See problem oriented charting Chief Complaint  Patient presents with  . Atrial Fibrillation  . Hyperlipidemia  . Hypertension  . Depression   This visit occurred during the SARS-CoV-2 public health emergency.  Safety protocols were in place, including screening questions prior to the visit, additional usage of staff PPE, and extensive cleaning of exam room while observing appropriate contact time as indicated for disinfecting solutions.   Past Medical History-  Patient Active Problem List   Diagnosis Date Noted  . Elevated coronary artery calcium score 05/02/2020    Priority: High  . Stage III squamous cell carcinoma of right lung ([place]) 01/06/2019    Priority: High  . Alcoholism ([place]) 06/21/2014    Priority: High  . Atrial fibrillation ([place]) 03/04/2007    Priority: High  . Elevated TSH 09/21/2014    Priority: Medium  . Seborrheic dermatitis 07/19/2007    Priority: Medium  . Hyperlipidemia 03/04/2007    Priority: Medium  . Major depression ([place]) 03/04/2007    Priority: Medium  . Essential hypertension 03/04/2007    Priority: Medium  . Former smoker 06/21/2014    Priority: Low  . Allergic rhinitis 04/06/2014    Priority: Low  . Hx of adenomatous colonic polyps 03/05/2014    Priority: Low  . Chronic anticoagulation 03/05/2014    Priority: Low  . Testosterone deficiency 09/02/2010    Priority: Low  . Abnormal findings on diagnostic imaging of lung 12/15/2019  . Shortness of breath 12/15/2019  . Drug-induced pneumonitis 09/19/2019  . Encounter for antineoplastic immunotherapy 03/29/2019  . Goals of care, counseling/discussion 01/06/2019  . Cough 09/12/2018  . Overweight 08/09/2017    Medications- reviewed and updated Current Outpatient Medications  Medication Sig Dispense Refill  . amLODipine (NORVASC) 5 MG tablet Take 1  tablet (5 mg total) by mouth daily. 90 tablet 3  . atenolol (TENORMIN) 50 MG tablet TAKE 1 TABLET BY MOUTH  TWICE DAILY 180 tablet 3  . budesonide-formoterol (SYMBICORT) 160-4.5 MCG/ACT inhaler Inhale into the lungs.    . clobetasol (TEMOVATE) 0.05 % external solution Apply 1 application topically 2 (two) times daily. For seborrheic dermatitis- 7 days maximum (Patient taking differently: Apply 1 application topically 2 (two) times daily as needed (scalp itch).) 50 mL 0  . Multiple Vitamin (MULTIVITAMIN WITH MINERALS) TABS tablet Take 1 tablet by mouth daily.    . rivaroxaban (XARELTO) 20 MG TABS tablet Take 20 mg by mouth daily.    . rosuvastatin (CRESTOR) 10 MG tablet Take 1 tablet (10 mg total) by mouth daily. 30 tablet 3  . triamcinolone cream (KENALOG) 0.5 % Apply topically 2 (two) times daily as needed. (Patient taking differently: Apply 1 application topically 2 (two) times daily as needed (rash).) 30 g 1  . DERMA-SMOOTHE/FS BODY 0.01 % OIL SMARTSIG:1 Liberally Topical 3 Times Daily PRN (Patient not taking: Reported on 05/02/2020)     No current facility-administered medications for this visit.     Objective:  BP 130/78   Pulse 84   Temp (!) 97.1 F (36.2 C) (Temporal)   Ht 5\' 10"  (1.778 m)   Wt 204 lb 6.4 oz (92.7 kg)   SpO2 97%   BMI 29.33 kg/m  Gen: NAD, resting comfortably CV: irregularly irregular  no murmurs rubs or gallops Lungs: slight intermittent wheeze. no crackles, rhonchi Abdomen: soft/nontender/nondistended/normal bowel sounds.  Ext:  no edema Skin: warm, dry     Assessment and Plan   #social update- will see family next weekend and granddaughter birthday in [place]  #Shortness of breath- ongoing issue. Getting 2nd opinion from [facility]. Cardiology did not think cardiac related. Did have elevated coronary calcium score of 298- doing ok with rosuvastatin. Also started on symbicort and has noted some mild improvement. He has follow up planned in a month. Pending CT  results/discussion with pulmonology. They think pneumonitis from immunotherapy was most likely cause of breathing issues. Sleep study also planned. Also bronchoscopy considered. Plan for possible FAM treatment (fluticaseone, azithromycin, montelukast) -prednisone has helped cough but not shortness of breath  #hypertension S: medication: amlodipine 5 mg in past but has been off recently- did start a few days ago, atenolol 50mg  BID Home readings #s: 150s at home off amlodipine- noted increase when started inhaler. Initial BP today was 160/100. Since restarting amlodipine has not rechecked BP other today BP Readings from Last 3 Encounters:  05/02/20 130/78  02/29/20 102/70  02/07/20 122/78  A/P: blood pressure controlled in office but has been poorly controlled at home. We will continue atenolol. Restart amlodipine 5 mg and asked him to update me in 2 weeks with how he is doing  #elevated coronary calcium score #hyperlipidemia S: Medication: rosuvastatin 10mg  , not on aspirin as on xarelto Lab Results  Component Value Date   CHOL 210 (H) 02/19/2020   HDL 67 02/19/2020   LDLCALC 125 (H) 02/19/2020   LDLDIRECT 91.0 02/09/2017   TRIG 101 02/19/2020   CHOLHDL 3.1 02/19/2020   A/P: patient is a hard stick- we opted to wait until next visit as no major abnormalities on labs- mild leukocytosis and thrombocytopenia- will get cbc diff, cmp, LDL next visit. Potential TSH- mild elevations in the past   # Depression-meds, therapies not helpful in past. He is agreeable to a psychiatry referral. Has not done well with SSRIs in past. Did see psychiatyr years ago.   Recommended follow up: Return in about 4 months (around 08/30/2020). Future Appointments  Date Time Provider [place]  05/10/2020  8:30 AM [facility] None  05/13/2020  8:30 AM Curt Bears, MD [facility] [facility] None  01/07/2021  1:30 PM [facility] PHARMACIST [facility]    Lab/Order associations:   ICD-10-CM   1.  Essential hypertension  I10   2. Hyperlipidemia, unspecified hyperlipidemia type  E78.5   3. Recurrent major depressive disorder, in partial remission ([place])  F33.41 Ambulatory referral to Psychiatry  4. Elevated coronary artery calcium score  R93.1   5. DOE (dyspnea on exertion)  R06.00   6. Pneumonitis, interstitial ([facility])  J84.89    Meds ordered this encounter  Medications  . amLODipine (NORVASC) 5 MG tablet    Sig: Take 1 tablet (5 mg total) by mouth daily.    Dispense:  90 tablet    Refill:  3    Do not fill unless patient calls- he has excess   Return precautions advised.  Garret Reddish, MD

## 2020-05-02 ENCOUNTER — Other Ambulatory Visit: Payer: Self-pay

## 2020-05-02 ENCOUNTER — Ambulatory Visit (INDEPENDENT_AMBULATORY_CARE_PROVIDER_SITE_OTHER): Payer: Medicare Other | Admitting: Family Medicine

## 2020-05-02 ENCOUNTER — Encounter: Payer: Self-pay | Admitting: Family Medicine

## 2020-05-02 VITALS — BP 130/78 | HR 84 | Temp 97.1°F | Ht 70.0 in | Wt 204.4 lb

## 2020-05-02 DIAGNOSIS — E785 Hyperlipidemia, unspecified: Secondary | ICD-10-CM

## 2020-05-02 DIAGNOSIS — I10 Essential (primary) hypertension: Secondary | ICD-10-CM | POA: Diagnosis not present

## 2020-05-02 DIAGNOSIS — F3342 Major depressive disorder, recurrent, in full remission: Secondary | ICD-10-CM

## 2020-05-02 DIAGNOSIS — F3341 Major depressive disorder, recurrent, in partial remission: Secondary | ICD-10-CM | POA: Diagnosis not present

## 2020-05-02 DIAGNOSIS — R931 Abnormal findings on diagnostic imaging of heart and coronary circulation: Secondary | ICD-10-CM | POA: Diagnosis not present

## 2020-05-02 DIAGNOSIS — K219 Gastro-esophageal reflux disease without esophagitis: Secondary | ICD-10-CM

## 2020-05-02 DIAGNOSIS — J8489 Other specified interstitial pulmonary diseases: Secondary | ICD-10-CM | POA: Diagnosis not present

## 2020-05-02 DIAGNOSIS — R0600 Dyspnea, unspecified: Secondary | ICD-10-CM | POA: Diagnosis not present

## 2020-05-02 DIAGNOSIS — R0609 Other forms of dyspnea: Secondary | ICD-10-CM

## 2020-05-02 MED ORDER — AMLODIPINE BESYLATE 5 MG PO TABS: 5.0000 mg | ORAL_TABLET | Freq: Every day | ORAL | 3 refills | Status: DC

## 2020-05-06 ENCOUNTER — Encounter: Payer: Self-pay | Admitting: Internal Medicine

## 2020-05-10 ENCOUNTER — Inpatient Hospital Stay: Payer: Medicare Other | Attending: Internal Medicine

## 2020-05-10 ENCOUNTER — Other Ambulatory Visit: Payer: Self-pay

## 2020-05-10 DIAGNOSIS — J704 Drug-induced interstitial lung disorders, unspecified: Secondary | ICD-10-CM | POA: Diagnosis not present

## 2020-05-10 DIAGNOSIS — Z85118 Personal history of other malignant neoplasm of bronchus and lung: Secondary | ICD-10-CM | POA: Insufficient documentation

## 2020-05-10 DIAGNOSIS — C3490 Malignant neoplasm of unspecified part of unspecified bronchus or lung: Secondary | ICD-10-CM

## 2020-05-10 DIAGNOSIS — Z9221 Personal history of antineoplastic chemotherapy: Secondary | ICD-10-CM | POA: Insufficient documentation

## 2020-05-10 LAB — CBC WITH DIFFERENTIAL (CANCER CENTER ONLY)
Abs Immature Granulocytes: 0.11 10*3/uL — ABNORMAL HIGH (ref 0.00–0.07)
Basophils Absolute: 0.1 10*3/uL (ref 0.0–0.1)
Basophils Relative: 1 %
Eosinophils Absolute: 0.1 10*3/uL (ref 0.0–0.5)
Eosinophils Relative: 1 %
HCT: 47.8 % (ref 39.0–52.0)
Hemoglobin: 15.6 g/dL (ref 13.0–17.0)
Immature Granulocytes: 1 %
Lymphocytes Relative: 17 %
Lymphs Abs: 2 10*3/uL (ref 0.7–4.0)
MCH: 31.4 pg (ref 26.0–34.0)
MCHC: 32.6 g/dL (ref 30.0–36.0)
MCV: 96.2 fL (ref 80.0–100.0)
Monocytes Absolute: 0.9 10*3/uL (ref 0.1–1.0)
Monocytes Relative: 7 %
Neutro Abs: 8.7 10*3/uL — ABNORMAL HIGH (ref 1.7–7.7)
Neutrophils Relative %: 73 %
Platelet Count: 193 10*3/uL (ref 150–400)
RBC: 4.97 MIL/uL (ref 4.22–5.81)
RDW: 12.9 % (ref 11.5–15.5)
WBC Count: 11.8 10*3/uL — ABNORMAL HIGH (ref 4.0–10.5)
nRBC: 0 % (ref 0.0–0.2)

## 2020-05-10 LAB — CMP (CANCER CENTER ONLY)
ALT: 31 U/L (ref 0–44)
AST: 27 U/L (ref 15–41)
Albumin: 4 g/dL (ref 3.5–5.0)
Alkaline Phosphatase: 60 U/L (ref 38–126)
Anion gap: 10 (ref 5–15)
BUN: 18 mg/dL (ref 8–23)
CO2: 24 mmol/L (ref 22–32)
Calcium: 9 mg/dL (ref 8.9–10.3)
Chloride: 106 mmol/L (ref 98–111)
Creatinine: 0.94 mg/dL (ref 0.61–1.24)
GFR, Estimated: >60 mL/min (ref >60)
Glucose, Bld: 81 mg/dL (ref 70–99)
Potassium: 4.4 mmol/L (ref 3.5–5.1)
Sodium: 140 mmol/L (ref 135–145)
Total Bilirubin: 0.6 mg/dL (ref 0.3–1.2)
Total Protein: 7.2 g/dL (ref 6.5–8.1)

## 2020-05-13 ENCOUNTER — Inpatient Hospital Stay: Payer: Medicare Other | Admitting: Internal Medicine

## 2020-05-13 ENCOUNTER — Telehealth: Payer: Self-pay | Admitting: Internal Medicine

## 2020-05-13 ENCOUNTER — Other Ambulatory Visit: Payer: Self-pay

## 2020-05-13 DIAGNOSIS — J704 Drug-induced interstitial lung disorders, unspecified: Secondary | ICD-10-CM | POA: Diagnosis not present

## 2020-05-13 DIAGNOSIS — C3490 Malignant neoplasm of unspecified part of unspecified bronchus or lung: Secondary | ICD-10-CM

## 2020-05-13 DIAGNOSIS — Z85118 Personal history of other malignant neoplasm of bronchus and lung: Secondary | ICD-10-CM | POA: Diagnosis not present

## 2020-05-13 DIAGNOSIS — Z9221 Personal history of antineoplastic chemotherapy: Secondary | ICD-10-CM | POA: Diagnosis not present

## 2020-05-13 NOTE — Telephone Encounter (Signed)
Scheduled per 02/14 los, patient will be updated per my chart.

## 2020-05-13 NOTE — Progress Notes (Signed)
[place] Telephone:([phone]   Fax:(336) [phone]  OFFICE PROGRESS NOTE  [name], MD [place] [address]  DIAGNOSIS: Stage IIIb (T3, N2, M0) non-small cell lung cancer, squamous cell carcinoma presented with right lower lobe lung mass in addition to subcarinal lymphadenopathy diagnosed in October 2020.  PRIOR THERAPY:  1) Weekly concurrent chemoradiation with Carboplatin for an AUC of 2 Paclitaxel 45 mg/m2. First dose 01/16/2019. Status post5cycles.  Last dose was given February 13, 2019. 2) Consolidation treatment with immunotherapy with Imfinzi 1500 mg IV every 4 weeks.  First dose April 05, 2019.  Status post 5 cycles.  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  CURRENT THERAPY:  Observation.  INTERVAL HISTORY: Martin Bailey 80 y.o. male returns to the clinic today for 4 months follow-up visit.  The patient is feeling fine today with no concerning complaints.  His cough and shortness of breath has significantly improved.  He is followed by a pulmonologist within the [facility] system.  He denied having any headache or visual changes.  He denied having any fever or chills.  He has no chest pain, shortness of breath, cough or hemoptysis.  He has no recent weight loss or night sweats.  The patient had CT scan of the chest performed in December 2021 and is here for evaluation and discussion of his scan results and further recommendation regarding his condition.   MEDICAL HISTORY: Past Medical History:  Diagnosis Date  . Cataract    left  . COLONIC POLYPS, HX OF 04/05/2007   ONE ADENOMATOUS POLYP AND ONE HYPERPLASTIC POLYP  . DEPRESSION 03/04/2007  . DERMATITIS, SEBORRHEIC 07/19/2007  . FIBRILLATION, ATRIAL 03/04/2007  . Hearing aid worn    B/L  . HYPERLIPIDEMIA 03/04/2007  . HYPERTENSION 03/04/2007  . Kidney stones   . Lung cancer ([place]) dx'd 02/2019   non small cell lung ca  . Lung  mass    mediastinal adenopathy    ALLERGIES:  is allergic to hydrocodone.  MEDICATIONS:  Current Outpatient Medications  Medication Sig Dispense Refill  . amLODipine (NORVASC) 5 MG tablet Take 1 tablet (5 mg total) by mouth daily. 90 tablet 3  . atenolol (TENORMIN) 50 MG tablet TAKE 1 TABLET BY MOUTH  TWICE DAILY 180 tablet 3  . budesonide-formoterol (SYMBICORT) 160-4.5 MCG/ACT inhaler Inhale into the lungs.    . clobetasol (TEMOVATE) 0.05 % external solution Apply 1 application topically 2 (two) times daily. For seborrheic dermatitis- 7 days maximum (Patient taking differently: Apply 1 application topically 2 (two) times daily as needed (scalp itch).) 50 mL 0  . DERMA-SMOOTHE/FS BODY 0.01 % OIL SMARTSIG:1 Liberally Topical 3 Times Daily PRN (Patient not taking: Reported on 05/02/2020)    . Multiple Vitamin (MULTIVITAMIN WITH MINERALS) TABS tablet Take 1 tablet by mouth daily.    . rivaroxaban (XARELTO) 20 MG TABS tablet Take 20 mg by mouth daily.    . rosuvastatin (CRESTOR) 10 MG tablet Take 1 tablet (10 mg total) by mouth daily. 30 tablet 3  . triamcinolone cream (KENALOG) 0.5 % Apply topically 2 (two) times daily as needed. (Patient taking differently: Apply 1 application topically 2 (two) times daily as needed (rash).) 30 g 1   No current facility-administered medications for this visit.    SURGICAL HISTORY:  Past Surgical History:  Procedure Laterality Date  . BIOPSY  01/09/2020   Procedure: BIOPSY;  Surgeon: Laurin Coder, MD;  Location: [facility];  Service: Endoscopy;;  . BRONCHIAL WASHINGS  01/09/2020   Procedure: BRONCHIAL WASHINGS;  Surgeon: Laurin Coder, MD;  Location: [facility];  Service: Endoscopy;;  . CATARACT EXTRACTION W/ INTRAOCULAR LENS IMPLANT     right  . COLONOSCOPY    . IR IMAGING GUIDED PORT INSERTION  01/31/2019  . RADIOFREQUENCY ABLATION     x2 for fib, on 2nd attempt switched form a flutter ot a fib.   Marland Kitchen ROTATOR CUFF REPAIR Right   .  TONSILLECTOMY    . VIDEO BRONCHOSCOPY N/A 01/09/2020   Procedure: VIDEO BRONCHOSCOPY WITH FLUORO;  Surgeon: Laurin Coder, MD;  Location: [facility];  Service: Endoscopy;  Laterality: N/A;  . VIDEO BRONCHOSCOPY WITH ENDOBRONCHIAL ULTRASOUND Left 12/30/2018   Procedure: VIDEO BRONCHOSCOPY WITH ENDOBRONCHIAL ULTRASOUND WITH FLUOROSCOPY;  Surgeon: Laurin Coder, MD;  Location: [place];  Service: Thoracic;  Laterality: Left;  . WISDOM TOOTH EXTRACTION      REVIEW OF SYSTEMS:  A comprehensive review of systems was negative.   PHYSICAL EXAMINATION: General appearance: alert, cooperative and no distress Head: Normocephalic, without obvious abnormality, atraumatic Neck: no adenopathy, no JVD, supple, symmetrical, trachea midline and thyroid not enlarged, symmetric, no tenderness/mass/nodules Lymph nodes: Cervical, supraclavicular, and axillary nodes normal. Resp: clear to auscultation bilaterally Back: symmetric, no curvature. ROM normal. No CVA tenderness. Cardio: regular rate and rhythm, S1, S2 normal, no murmur, click, rub or gallop GI: soft, non-tender; bowel sounds normal; no masses,  no organomegaly Extremities: extremities normal, atraumatic, no cyanosis or edema  ECOG PERFORMANCE STATUS: 1 - Symptomatic but completely ambulatory  Blood pressure 135/69, pulse 79, temperature 97.9 F (36.6 C), temperature source Tympanic, resp. rate 15, height 5\' 10"  (1.778 m), weight 205 lb 9.6 oz (93.3 kg), SpO2 100 %.  LABORATORY DATA: Lab Results  Component Value Date   WBC 11.8 (H) 05/10/2020   HGB 15.6 05/10/2020   HCT 47.8 05/10/2020   MCV 96.2 05/10/2020   PLT 193 05/10/2020      Chemistry      Component Value Date/Time   NA 140 05/10/2020 0832   K 4.4 05/10/2020 0832   CL 106 05/10/2020 0832   CO2 24 05/10/2020 0832   BUN 18 05/10/2020 0832   CREATININE 0.94 05/10/2020 0832      Component Value Date/Time   CALCIUM 9.0 05/10/2020 0832   ALKPHOS 60 05/10/2020 0832   AST  27 05/10/2020 0832   ALT 31 05/10/2020 0832   BILITOT 0.6 05/10/2020 0832       RADIOGRAPHIC STUDIES: No results found.  ASSESSMENT AND PLAN: This is a very pleasant 80 years old white male with a stage IIIb non-small cell lung cancer, squamous cell carcinoma diagnosed in October 2020 status post course of concurrent chemoradiation with 5 cycles of chemotherapy with carboplatin and paclitaxel. The patient tolerated his treatment well except for pancytopenia. The patient had partial response after his treatment. He is currently undergoing consolidation treatment with immunotherapy with Imfinzi 1500 mg IV every 4 weeks status post 5  cycles.   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 His treatment was discontinued because of the persistent and recurrent immunotherapy mediated pneumonitis. The patient started on a tapered dose of prednisone and over the last few weeks he felt much better with more energy and appetite as well as less cough and shortness of breath. The patient is feeling much better now with no concerning complaints. Repeat CT scan  of the chest in December 2021 showed no concerning findings for disease progression. I recommended for him to continue on observation with repeat CT scan of the chest in 4 months for restaging of his disease. The patient was advised to call immediately if he has any other concerning symptoms in the interval. The patient voices understanding of current disease status and treatment options and is in agreement with the current care plan.  All questions were answered. The patient knows to call the clinic with any problems, questions or concerns. We can certainly see the patient much sooner if necessary.  Disclaimer: This note was dictated with voice recognition software. Similar sounding words can inadvertently be transcribed and may not be corrected upon review.

## 2020-05-21 ENCOUNTER — Other Ambulatory Visit: Payer: Self-pay

## 2020-05-21 MED ORDER — ROSUVASTATIN CALCIUM 10 MG PO TABS: 10.0000 mg | ORAL_TABLET | Freq: Every day | ORAL | 3 refills | Status: DC

## 2020-05-22 MED ORDER — ROSUVASTATIN CALCIUM 10 MG PO TABS: 10.0000 mg | ORAL_TABLET | Freq: Every day | ORAL | 3 refills | Status: DC

## 2020-05-24 DIAGNOSIS — R0600 Dyspnea, unspecified: Secondary | ICD-10-CM | POA: Diagnosis not present

## 2020-05-24 DIAGNOSIS — T50905A Adverse effect of unspecified drugs, medicaments and biological substances, initial encounter: Secondary | ICD-10-CM | POA: Diagnosis not present

## 2020-05-24 DIAGNOSIS — J849 Interstitial pulmonary disease, unspecified: Secondary | ICD-10-CM | POA: Diagnosis not present

## 2020-05-24 DIAGNOSIS — R0602 Shortness of breath: Secondary | ICD-10-CM | POA: Diagnosis not present

## 2020-08-12 ENCOUNTER — Other Ambulatory Visit: Payer: Self-pay | Admitting: Family Medicine

## 2020-08-30 ENCOUNTER — Ambulatory Visit: Payer: Medicare Other | Admitting: Family Medicine

## 2020-08-30 DIAGNOSIS — T50905A Adverse effect of unspecified drugs, medicaments and biological substances, initial encounter: Secondary | ICD-10-CM | POA: Diagnosis not present

## 2020-08-30 DIAGNOSIS — J849 Interstitial pulmonary disease, unspecified: Secondary | ICD-10-CM | POA: Diagnosis not present

## 2020-08-30 DIAGNOSIS — R0602 Shortness of breath: Secondary | ICD-10-CM | POA: Diagnosis not present

## 2020-09-05 ENCOUNTER — Other Ambulatory Visit: Payer: Self-pay

## 2020-09-05 ENCOUNTER — Ambulatory Visit (INDEPENDENT_AMBULATORY_CARE_PROVIDER_SITE_OTHER): Payer: Medicare Other | Admitting: Family Medicine

## 2020-09-05 ENCOUNTER — Encounter: Payer: Self-pay | Admitting: Family Medicine

## 2020-09-05 VITALS — BP 104/74 | HR 96 | Temp 97.3°F | Ht 70.0 in | Wt 205.8 lb

## 2020-09-05 DIAGNOSIS — C3491 Malignant neoplasm of unspecified part of right bronchus or lung: Secondary | ICD-10-CM

## 2020-09-05 DIAGNOSIS — R931 Abnormal findings on diagnostic imaging of heart and coronary circulation: Secondary | ICD-10-CM

## 2020-09-05 DIAGNOSIS — F3342 Major depressive disorder, recurrent, in full remission: Secondary | ICD-10-CM

## 2020-09-05 DIAGNOSIS — I4811 Longstanding persistent atrial fibrillation: Secondary | ICD-10-CM | POA: Diagnosis not present

## 2020-09-05 DIAGNOSIS — E785 Hyperlipidemia, unspecified: Secondary | ICD-10-CM

## 2020-09-05 DIAGNOSIS — D692 Other nonthrombocytopenic purpura: Secondary | ICD-10-CM | POA: Insufficient documentation

## 2020-09-05 DIAGNOSIS — F1020 Alcohol dependence, uncomplicated: Secondary | ICD-10-CM

## 2020-09-05 MED ORDER — PROCHLORPERAZINE MALEATE 10 MG PO TABS: 10.0000 mg | ORAL_TABLET | Freq: Four times a day (QID) | ORAL | 0 refills | Status: DC | PRN

## 2020-09-05 NOTE — Progress Notes (Signed)
Phone [phone] In person visit   Subjective:   Martin Bailey is a 80 y.o. year old very pleasant male patient who presents for/with See problem oriented charting Chief Complaint  Patient presents with   Hypertension   Hyperlipidemia   This visit occurred during the SARS-CoV-2 public health emergency.  Safety protocols were in place, including screening questions prior to the visit, additional usage of staff PPE, and extensive cleaning of exam room while observing appropriate contact time as indicated for disinfecting solutions.   Past Medical History-  Patient Active Problem List   Diagnosis Date Noted   Elevated coronary artery calcium score 05/02/2020    Priority: High   Stage III squamous cell carcinoma of right lung ([place]) 01/06/2019    Priority: High   Alcoholism ([place]) 06/21/2014    Priority: High   Atrial fibrillation ([place]) 03/04/2007    Priority: High   Elevated TSH 09/21/2014    Priority: Medium   Seborrheic dermatitis 07/19/2007    Priority: Medium   Hyperlipidemia 03/04/2007    Priority: Medium   Major depression ([place]) 03/04/2007    Priority: Medium   Essential hypertension 03/04/2007    Priority: Medium   Senile purpura ([employer]) 09/05/2020    Priority: Low   Former smoker 06/21/2014    Priority: Low   Allergic rhinitis 04/06/2014    Priority: Low   Hx of adenomatous colonic polyps 03/05/2014    Priority: Low   Chronic anticoagulation 03/05/2014    Priority: Low   Testosterone deficiency 09/02/2010    Priority: Low   Abnormal findings on diagnostic imaging of lung 12/15/2019   Shortness of breath 12/15/2019   Drug-induced pneumonitis 09/19/2019   Encounter for antineoplastic immunotherapy 03/29/2019   Goals of care, counseling/discussion 01/06/2019   Cough 09/12/2018   Overweight 08/09/2017    Medications- reviewed and updated Current Outpatient Medications  Medication Sig Dispense Refill   amLODipine (NORVASC) 5 MG tablet TAKE 1 TABLET BY  MOUTH  DAILY 90 tablet 3   atenolol (TENORMIN) 50 MG tablet TAKE 1 TABLET BY MOUTH  TWICE DAILY 180 tablet 3   budesonide-formoterol (SYMBICORT) 160-4.5 MCG/ACT inhaler Inhale into the lungs.     clobetasol (TEMOVATE) 0.05 % external solution Apply 1 application topically 2 (two) times daily. For seborrheic dermatitis- 7 days maximum (Patient taking differently: Apply 1 application topically 2 (two) times daily as needed (scalp itch).) 50 mL 0   DERMA-SMOOTHE/FS BODY 0.01 % OIL      Multiple Vitamin (MULTIVITAMIN WITH MINERALS) TABS tablet Take 1 tablet by mouth daily.     rivaroxaban (XARELTO) 20 MG TABS tablet Take 20 mg by mouth daily.     rosuvastatin (CRESTOR) 10 MG tablet Take 1 tablet (10 mg total) by mouth daily. 90 tablet 3   triamcinolone cream (KENALOG) 0.5 % Apply topically 2 (two) times daily as needed. (Patient taking differently: Apply 1 application topically 2 (two) times daily as needed (rash).) 30 g 1   prochlorperazine (COMPAZINE) 10 MG tablet Take 1 tablet (10 mg total) by mouth every 6 (six) hours as needed for nausea or vomiting. 30 tablet 0   No current facility-administered medications for this visit.     Objective:  BP 104/74   Pulse 96   Temp (!) 97.3 F (36.3 C) (Temporal)   Ht 5\' 10"  (1.778 m)   Wt 205 lb 12.8 oz (93.4 kg)   SpO2 98%   BMI 29.53 kg/m  Gen: NAD, resting comfortably CV: RRR no murmurs  rubs or gallops Lungs: CTAB no crackles, wheeze, rhonchi Abdomen: soft/nontender/nondistended/normal bowel sounds. No rebound or guarding.  Ext: trace edema Skin: warm, dry     Assessment and Plan  # stage III squamous cell carcinoma of the lungs S:  diagnosed October 2020-status post course of concurrent chemoradiation with 5 cycles of chemotherapy. Did develop pancytopenia.  - with chemoradiation- Patient had partial response with initial treatment and underwent consolidation treatment with immunotherapy Imfinzi - plan through January 2021 most likely -  also dealing with pneumonitis possibly related to the imfinzi which was stopped last year- Symbicort 160-4.5 MCG/ACT inhaler- working with [employer].  - he has an upcoming chest CT on 09/09/20 with Dr. Earlie Server. He will also be doing blood work  - He did see pul[facility]ology recently [facility] says inhaler has been working with him- he is seeing improvements and it allows him to go back to the gym 3x per week  A/P: Patient seems to continue to do well in regards to lung cancer-has upcoming scan and we are hopeful for good results.  Also pneumonitis related to prior immunotherapy is drastically improved on Symbicort-patient even back at the gym!  We are thrilled he is able to get back to that  #Persistent atrial fibrillation S: Rate controlled with atenolol 50mg  Anticoagulated with Xarelto 20mg  Patient is not followed by cardiology currently for this-did see previously due to shortness of breath concerns but ultimately thought to be pulmonary in origin A/P:  appropriately anticoagulated and rate controlled-continue current medications.  #hypertension S: medication: atenolol 50mg , Amlodipine 5 mg- patient on Symbicort 160-4.5 MCG/ACT inhaler. -was recommended to cut amlodipine dose in half but he is still taking a full dose Home readings #s:  he checks once a week- average 105-115 BP Readings from Last 3 Encounters:  09/05/20 104/74  05/13/20 135/69  05/02/20 130/78  A/P: Mildly over controlled. Patient is going to trial taking a half dose of amlodipine.  Otherwise continue current medication  #hyperlipidemiaCoronary calcium scoring 298. S: Medication: None.  We placed him back on rosuvastatin 10 mg daily.  But we have not done labs since last visit Lab Results  Component Value Date   CHOL 210 (H) 02/19/2020   HDL 67 02/19/2020   LDLCALC 125 (H) 02/19/2020   LDLDIRECT 91.0 02/09/2017   TRIG 101 02/19/2020   CHOLHDL 3.1 02/19/2020  A/P: Patient states he has a difficult "stick" and  request we try to add on LDL to labs with hematology next visit-I ordered future direct LDL-hoping to get LDL at least below 100 but prefer him under 70 due to elevated coronary calcium scoring  # Depression-his main desire is for companionship but does not believe this will happen S: Medication: None.  -did see psychiatry in the past and may return to see them if needed but has been doing better lately - Medications not beneficial in the past.  -Also has done counseling and did not find this particularly effective -Low testosterone previously treated and did not help with depression either -Activities  -(declines senior center- no good fits, discussed possible [place] [facility]) -- he is resigning to the process that things are the way they are and will not change- he is getting along with things. Depression screen [facility] 2/9 09/05/2020 05/02/2020 02/29/2020 12/07/2019 09/08/2019  Decreased Interest 0 0 0 3 3  Down, Depressed, Hopeless 0 2 0 3 3  PHQ - 2 Score 0 2 0 6 6  Altered sleeping 0 0 - 0  0  Tired, decreased energy 0 3 - 3 3  Change in appetite 0 0 - 3 0  Feeling bad or failure about yourself  0 2 - 1 1  Trouble concentrating 0 0 - 0 0  Moving slowly or fidgety/restless 0 0 - 0 0  Suicidal thoughts 0 0 - 0 0  PHQ-9 Score 0 7 - 13 10  Difficult doing work/chores Not difficult at all Somewhat difficult - Somewhat difficult Somewhat difficult  Some recent data might be hidden   A/P: PHQ-9 significantly improved-we will continue without medication-appears to be in full remission.  I think the improved breathing situation has been very helpful  #Alcoholism-currently drinking 5-6 beverages per day- he knows he is not doing well with this.  Recommended to max per day-would prefer complete cessation due to history of abuse - this could potentially be a source to the itching/skin irritation listed below. -This also could be a potential form of "treatment" for the depression-encourage  cessation and then we can reevaluate depression  #Spots/Rash/Senile Purpura on left arm- he reports that they have been there for 3-4 weeks ago. They are gradually resolving that he does not think these are "bruising".  We discussed checking CBC-he is going to have this done with hematology soon-he will let me know if there are any issues on this test #Skin Irritation/pruritus waist up S: happened right after initial cancer treatment- he stopped chemotherapy it due to intense itching (potential side effect). About 4 months ago, the symptom reoccurred and he went to a dermatologist- no specifics as to what was causing the irritation and gave him a steroid shot, body oil rub, and recommended allegra- says it did not help much on one hand but the itching eventually stopped on the other hand. Recently the itching is happening again- he thinks the steroid shot cleared it up but effects worked about 2 weeks after.  A/P: Unclear etiology-we discussed could be caused by his medications, excess alcohol intake, sensitivity to food or something in his environment-may be difficult to pin down.  Would recommend cutting out the alcohol and checking LFTs-has this planned soon with oncology  # Ingrown Toe Nail S:He reports it has been giving him trouble again- he as tried lifting it but has not seen improvement.   A/P: We discussed potential removal with podiatry-she would like to hold off for now  -He is cutting the toenails short and recommended against this-should cut straight across and be past the nailbed  -He can continue to lift up on the nail and trial letting the nail grow longer  -If it does not improve, we will place a referral to podiatry.  He will let me know in 2 to 3 weeks   Recommended follow up: Return in about 4 months (around 01/05/2021) for follow up- or sooner if needed. Future Appointments  Date Time Provider [place]  09/09/2020  9:00 AM [facility] LAB [facility] None  09/09/2020  10:00 AM [facility] 2 [facility] [place]  09/10/2020 10:00 AM Curt Bears, MD [facility] None  01/07/2021  1:30 PM [facility] CCM PHARMACIST [facility]  03/13/2021  8:40 AM Tasman Zapata, [name], MD [facility]    Lab/Order associations:   ICD-10-CM   1. Hyperlipidemia, unspecified hyperlipidemia type  E78.5 LDL cholesterol, direct    2. Alcoholism ([place]) Chronic F10.20     3. Longstanding persistent atrial fibrillation (H[facility]) Chronic I48.11     4. Elevated coronary artery calcium score  R93.1  5. Stage III squamous cell carcinoma of right lung (H[facility])  C34.91     6. Recurrent major depressive disorder, in full remission ([place])  F33.42     7. Senile purpura ([place])  D69.2      I,Harris Phan,acting as a scribe for Garret Reddish, MD.,have documented all relevant documentation on the behalf of Garret Reddish, MD,as directed by  Garret Reddish, MD while in the presence of Garret Reddish, MD.   I, Garret Reddish, MD, have reviewed all documentation for this visit. The documentation on 09/05/20 for the exam, diagnosis, procedures, and orders are all accurate and complete.   Return precautions advised.  Garret Reddish, MD

## 2020-09-05 NOTE — Patient Instructions (Addendum)
Health Maintenance Due  Topic Date Due   Prevnar 20/final pneumonia shot- Eligible, Currently out in the office.  Never done   If possible have the cancer center add in the direct LDL that I ordered today.  If not we can do this with your next labs here.  Tried lifting the toenail but also allowing the toenail to grow out more and cut straight across instead of angled down.  If you have no success with this please let me place a referral to podiatry for you  I wonder if alcohol intake is contributing to the skin irritation- could try cutting down- also like your idea of trying allegra. If no improvement in 3 weeks- consider steroid injection with [place]. If no better- could do dermatology recheck or even allergiest  Recommended follow up: Return in about 4 months (around 01/05/2021) for follow up- or sooner if needed.

## 2020-09-05 NOTE — Progress Notes (Signed)
Prochlorper 10mg  sent to pharmacy.

## 2020-09-09 ENCOUNTER — Other Ambulatory Visit: Payer: Self-pay | Admitting: Family Medicine

## 2020-09-09 ENCOUNTER — Other Ambulatory Visit: Payer: Self-pay

## 2020-09-09 ENCOUNTER — Inpatient Hospital Stay: Payer: Medicare Other | Attending: Internal Medicine

## 2020-09-09 ENCOUNTER — Encounter: Payer: Self-pay | Admitting: Family Medicine

## 2020-09-09 ENCOUNTER — Ambulatory Visit (HOSPITAL_COMMUNITY)
Admission: RE | Admit: 2020-09-09 | Discharge: 2020-09-09 | Disposition: A | Discharge status: Home / Self Care | Payer: Medicare Other | Source: Ambulatory Visit | Attending: Internal Medicine | Admitting: Internal Medicine

## 2020-09-09 DIAGNOSIS — Z7951 Long term (current) use of inhaled steroids: Secondary | ICD-10-CM | POA: Diagnosis not present

## 2020-09-09 DIAGNOSIS — I313 Pericardial effusion (noninflammatory): Secondary | ICD-10-CM | POA: Diagnosis not present

## 2020-09-09 DIAGNOSIS — I119 Hypertensive heart disease without heart failure: Secondary | ICD-10-CM | POA: Diagnosis not present

## 2020-09-09 DIAGNOSIS — Z79899 Other long term (current) drug therapy: Secondary | ICD-10-CM | POA: Diagnosis not present

## 2020-09-09 DIAGNOSIS — E785 Hyperlipidemia, unspecified: Secondary | ICD-10-CM | POA: Insufficient documentation

## 2020-09-09 DIAGNOSIS — C3431 Malignant neoplasm of lower lobe, right bronchus or lung: Secondary | ICD-10-CM | POA: Insufficient documentation

## 2020-09-09 DIAGNOSIS — Z7901 Long term (current) use of anticoagulants: Secondary | ICD-10-CM | POA: Insufficient documentation

## 2020-09-09 DIAGNOSIS — C3490 Malignant neoplasm of unspecified part of unspecified bronchus or lung: Secondary | ICD-10-CM

## 2020-09-09 DIAGNOSIS — Z7689 Persons encountering health services in other specified circumstances: Secondary | ICD-10-CM | POA: Diagnosis not present

## 2020-09-09 DIAGNOSIS — I2510 Atherosclerotic heart disease of native coronary artery without angina pectoris: Secondary | ICD-10-CM | POA: Diagnosis not present

## 2020-09-09 DIAGNOSIS — J90 Pleural effusion, not elsewhere classified: Secondary | ICD-10-CM | POA: Diagnosis not present

## 2020-09-09 LAB — CMP (CANCER CENTER ONLY)
ALT: 22 U/L (ref 0–44)
AST: 49 U/L — ABNORMAL HIGH (ref 15–41)
Albumin: 3.9 g/dL (ref 3.5–5.0)
Alkaline Phosphatase: 54 U/L (ref 38–126)
Anion gap: 10 (ref 5–15)
BUN: 16 mg/dL (ref 8–23)
CO2: 23 mmol/L (ref 22–32)
Calcium: 9.1 mg/dL (ref 8.9–10.3)
Chloride: 105 mmol/L (ref 98–111)
Creatinine: 0.96 mg/dL (ref 0.61–1.24)
GFR, Estimated: >60 mL/min (ref >60)
Glucose, Bld: 106 mg/dL — ABNORMAL HIGH (ref 70–99)
Potassium: 4.3 mmol/L (ref 3.5–5.1)
Sodium: 138 mmol/L (ref 135–145)
Total Bilirubin: 0.9 mg/dL (ref 0.3–1.2)
Total Protein: 7 g/dL (ref 6.5–8.1)

## 2020-09-09 LAB — CBC WITH DIFFERENTIAL (CANCER CENTER ONLY)
Abs Immature Granulocytes: 0.05 10*3/uL (ref 0.00–0.07)
Basophils Absolute: 0.1 10*3/uL (ref 0.0–0.1)
Basophils Relative: 1 %
Eosinophils Absolute: 0.5 10*3/uL (ref 0.0–0.5)
Eosinophils Relative: 6 %
HCT: 40 % (ref 39.0–52.0)
Hemoglobin: 13.8 g/dL (ref 13.0–17.0)
Immature Granulocytes: 1 %
Lymphocytes Relative: 15 %
Lymphs Abs: 1.2 10*3/uL (ref 0.7–4.0)
MCH: 35.1 pg — ABNORMAL HIGH (ref 26.0–34.0)
MCHC: 34.5 g/dL (ref 30.0–36.0)
MCV: 101.8 fL — ABNORMAL HIGH (ref 80.0–100.0)
Monocytes Absolute: 0.8 10*3/uL (ref 0.1–1.0)
Monocytes Relative: 10 %
Neutro Abs: 5.5 10*3/uL (ref 1.7–7.7)
Neutrophils Relative %: 67 %
Platelet Count: 146 10*3/uL — ABNORMAL LOW (ref 150–400)
RBC: 3.93 MIL/uL — ABNORMAL LOW (ref 4.22–5.81)
RDW: 12.4 % (ref 11.5–15.5)
WBC Count: 8 10*3/uL (ref 4.0–10.5)
nRBC: 0 % (ref 0.0–0.2)

## 2020-09-09 MED ORDER — HEPARIN SOD (PORK) LOCK FLUSH 100 UNIT/ML IV SOLN
INTRAVENOUS | Status: AC
  Filled 2020-09-09: qty 5

## 2020-09-09 MED ORDER — SODIUM CHLORIDE (PF) 0.9 % IJ SOLN
INTRAMUSCULAR | Status: AC
  Filled 2020-09-09: qty 50

## 2020-09-09 MED ORDER — HEPARIN SOD (PORK) LOCK FLUSH 100 UNIT/ML IV SOLN
500.0000 [IU] | Freq: Once | INTRAVENOUS | Status: AC
  Administered 2020-09-09: 500 [IU] via INTRAVENOUS

## 2020-09-09 MED ORDER — IOHEXOL 300 MG/ML  SOLN
75.0000 mL | Freq: Once | INTRAMUSCULAR | Status: AC | PRN
  Administered 2020-09-09: 75 mL via INTRAVENOUS

## 2020-09-10 ENCOUNTER — Inpatient Hospital Stay: Payer: Medicare Other | Admitting: Internal Medicine

## 2020-09-10 ENCOUNTER — Encounter: Payer: Self-pay | Admitting: Internal Medicine

## 2020-09-10 VITALS — BP 114/72 | HR 100 | Temp 97.9°F | Resp 18 | Ht 70.0 in | Wt 206.0 lb

## 2020-09-10 DIAGNOSIS — Z7951 Long term (current) use of inhaled steroids: Secondary | ICD-10-CM | POA: Diagnosis not present

## 2020-09-10 DIAGNOSIS — C3491 Malignant neoplasm of unspecified part of right bronchus or lung: Secondary | ICD-10-CM | POA: Diagnosis not present

## 2020-09-10 DIAGNOSIS — C3431 Malignant neoplasm of lower lobe, right bronchus or lung: Secondary | ICD-10-CM | POA: Diagnosis not present

## 2020-09-10 DIAGNOSIS — E785 Hyperlipidemia, unspecified: Secondary | ICD-10-CM | POA: Diagnosis not present

## 2020-09-10 DIAGNOSIS — C3490 Malignant neoplasm of unspecified part of unspecified bronchus or lung: Secondary | ICD-10-CM | POA: Diagnosis not present

## 2020-09-10 DIAGNOSIS — Z79899 Other long term (current) drug therapy: Secondary | ICD-10-CM | POA: Diagnosis not present

## 2020-09-10 DIAGNOSIS — I119 Hypertensive heart disease without heart failure: Secondary | ICD-10-CM | POA: Diagnosis not present

## 2020-09-10 DIAGNOSIS — Z7901 Long term (current) use of anticoagulants: Secondary | ICD-10-CM | POA: Diagnosis not present

## 2020-09-10 NOTE — Telephone Encounter (Signed)
LVM for patient to call back and schedule

## 2020-09-10 NOTE — Progress Notes (Signed)
[place] [phone]   [phone]  OFFICE PROGRESS NOTE  [name], MD [place] [address]  DIAGNOSIS: Stage IIIb (T3, N2, M0) non-small cell lung cancer, squamous cell carcinoma presented with right lower lobe lung mass in addition to subcarinal lymphadenopathy diagnosed in October 2020.    PRIOR THERAPY:  1) Weekly concurrent chemoradiation with Carboplatin for an AUC of 2 Paclitaxel 45 mg/m2. First dose 01/16/2019. Status post 5 cycles.  Last dose was given February 13, 2019. 2) Consolidation treatment with immunotherapy with Imfinzi 1500 mg IV every 4 weeks.  First dose April 05, 2019.  Status post 5 cycles.  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  CURRENT THERAPY:  Observation.  INTERVAL HISTORY: Martin Bailey 80 y.o. male returns to the clinic today for 77-month follow-up visit.  The patient is feeling fine today with no concerning complaints except for occasional shortness of breath and nausea when he eats dinner with his friends which happened 2 times over a year.  He denied having any current nausea, vomiting, diarrhea or constipation.  He denied having any fever or chills.  He has no weight loss or night sweats.  He denied having any chest pain, shortness of breath except with exertion with no cough or hemoptysis.  The patient had repeat CT scan of the chest performed recently and he is here for evaluation and discussion of his scan results.   MEDICAL HISTORY: Past Medical History:  Diagnosis Date   Cataract    left   COLONIC POLYPS, HX OF 04/05/2007   ONE ADENOMATOUS POLYP AND ONE HYPERPLASTIC POLYP   DEPRESSION 03/04/2007   DERMATITIS, SEBORRHEIC 07/19/2007   FIBRILLATION, ATRIAL 03/04/2007   Hearing aid worn    B/L   HYPERLIPIDEMIA 03/04/2007   HYPERTENSION 03/04/2007   Kidney stones    Lung cancer ([place]) dx'd 02/2019   non small cell lung ca   Lung  mass    mediastinal adenopathy    ALLERGIES:  is allergic to hydrocodone.  MEDICATIONS:  Current Outpatient Medications  Medication Sig Dispense Refill   amLODipine (NORVASC) 5 MG tablet TAKE 1 TABLET BY MOUTH  DAILY 90 tablet 3   atenolol (TENORMIN) 50 MG tablet TAKE 1 TABLET BY MOUTH  TWICE DAILY 180 tablet 3   budesonide-formoterol (SYMBICORT) 160-4.5 MCG/ACT inhaler Inhale into the lungs.     clobetasol (TEMOVATE) 0.05 % external solution Apply 1 application topically 2 (two) times daily. For seborrheic dermatitis- 7 days maximum (Patient taking differently: Apply 1 application topically 2 (two) times daily as needed (scalp itch).) 50 mL 0   DERMA-SMOOTHE/FS BODY 0.01 % OIL      Multiple Vitamin (MULTIVITAMIN WITH MINERALS) TABS tablet Take 1 tablet by mouth daily.     prochlorperazine (COMPAZINE) 10 MG tablet Take 1 tablet (10 mg total) by mouth every 6 (six) hours as needed for nausea or vomiting. 30 tablet 0   rivaroxaban (XARELTO) 20 MG TABS tablet Take 20 mg by mouth daily.     triamcinolone cream (KENALOG) 0.5 % Apply topically 2 (two) times daily as needed. (Patient taking differently: Apply 1 application topically 2 (two) times daily as needed (rash).) 30 g 1   rosuvastatin (CRESTOR) 10 MG tablet Take 1 tablet (10 mg total) by mouth daily. 90 tablet 3   No current facility-administered medications for this visit.    SURGICAL HISTORY:  Past Surgical History:  Procedure Laterality Date  BIOPSY  01/09/2020   Procedure: BIOPSY;  Surgeon: Laurin Coder, MD;  Location[facility];  Service: Endoscopy;;   BRONCHIAL WASHINGS  01/09/2020   Procedure: BRONCHIAL WASHINGS;  Surgeon: Laurin Coder, MD;  Location[facility];  Service: Endoscopy;;   CATARACT EXTRACTION W/ INTRAOCULAR LENS IMPLANT     right   COLONOSCOPY     IR IMAGING GUIDED PORT INSERTION  01/31/2019   RADIOFREQUENCY ABLATION     x2 for fib, on 2nd attempt switched form a flutter ot a fib.    ROTATOR  CUFF REPAIR Right    TONSILLECTOMY     VIDEO BRONCHOSCOPY N/A 01/09/2020   Procedure: VIDEO BRONCHOSCOPY WITH FLUORO;  Surgeon: Laurin Coder, MD;  Location[facility];  Service: Endoscopy;  Laterality: N/A;   VIDEO BRONCHOSCOPY WITH ENDOBRONCHIAL ULTRASOUND Left 12/30/2018   Procedure: VIDEO BRONCHOSCOPY WITH ENDOBRONCHIAL ULTRASOUND WITH FLUOROSCOPY;  Surgeon: Laurin Coder, MD;  Location: [facility];  Service: Thoracic;  Laterality: Left;   WISDOM TOOTH EXTRACTION      REVIEW OF SYSTEMS:  A comprehensive review of systems was negative except for: Respiratory: positive for dyspnea on exertion Gastrointestinal: positive for nausea   PHYSICAL EXAMINATION: General appearance: alert, cooperative, and no distress Head: Normocephalic, without obvious abnormality, atraumatic Neck: no adenopathy, no JVD, supple, symmetrical, trachea midline, and thyroid not enlarged, symmetric, no tenderness/mass/nodules Lymph nodes: Cervical, supraclavicular, and axillary nodes normal. Resp: clear to auscultation bilaterally Back: symmetric, no curvature. ROM normal. No CVA tenderness. Cardio: regular rate and rhythm, S1, S2 normal, no murmur, click, rub or gallop GI: soft, non-tender; bowel sounds normal; no masses,  no organomegaly Extremities: extremities normal, atraumatic, no cyanosis or edema  ECOG PERFORMANCE STATUS: 1 - Symptomatic but completely ambulatory  Blood pressure 114/72, pulse 100, temperature 97.9 F (36.6 C), temperature source Tympanic, resp. rate 18, height 5\' 10"  (1.778 m), weight 206 lb (93.4 kg), SpO2 99 %.  LABORATORY DATA: Lab Results  Component Value Date   WBC 8.0 09/09/2020   HGB 13.8 09/09/2020   HCT 40.0 09/09/2020   MCV 101.8 (H) 09/09/2020   PLT 146 (L) 09/09/2020      Chemistry      Component Value Date/Time   NA 138 09/09/2020 0856   K 4.3 09/09/2020 0856   CL 105 09/09/2020 0856   CO2 23 09/09/2020 0856   BUN 16 09/09/2020 0856   CREATININE 0.96  09/09/2020 0856      Component Value Date/Time   CALCIUM 9.1 09/09/2020 0856   ALKPHOS 54 09/09/2020 0856   AST 49 (H) 09/09/2020 0856   ALT 22 09/09/2020 0856   BILITOT 0.9 09/09/2020 0856       RADIOGRAPHIC STUDIES: CT Chest W Contrast  Result Date: 09/09/2020 CLINICAL DATA:  Lung cancer staging. EXAM: CT CHEST WITH CONTRAST TECHNIQUE: Multidetector CT imaging of the chest was performed during intravenous contrast administration. CONTRAST:  96mL OMNIPAQUE IOHEXOL 300 MG/ML  SOLN COMPARISON:  01/05/2020. FINDINGS: Cardiovascular: Right IJ Port-A-Cath terminates in the right atrium. Atherosclerotic calcification of the aorta and coronary arteries. Heart is enlarged. Moderate pericardial effusion. Mediastinum/Nodes: Scattered mediastinal lymph nodes are not enlarged by CT size criteria. No hilar or axillary adenopathy. Esophagus is grossly unremarkable. Lungs/Pleura: Centrilobular and paraseptal emphysema. Continued evolution of post treatment pulmonary retraction and bronchiectasis in the right perihilar region and right lower lobe. Small right pleural effusion. No suspicious pulmonary nodules. No left pleural fluid. Airway is unremarkable. Upper Abdomen: Subcentimeter low-attenuation lesion in the dome  of the liver is too small to characterize but stable. Visualized portions of the liver, gallbladder, adrenal glands, kidneys, spleen, pancreas, stomach and bowel are grossly unremarkable. No upper abdominal adenopathy. Musculoskeletal: Degenerative changes in the spine. T7 compression deformity is new an age indeterminate. No worrisome lytic or sclerotic lesions. IMPRESSION: 1. Evolutionary changes of radiation therapy in the right perihilar region and right lower lobe. No evidence of recurrent or metastatic disease. 2. Small right pleural effusion. 3. Moderate pericardial effusion. 4. Aortic atherosclerosis (ICD10-I70.0). Coronary artery calcification. 5.  Emphysema (ICD10-J43.9). Electronically  Signed   By: Lorin Picket M.D.   On: 09/09/2020 14:27    ASSESSMENT AND PLAN: This is a very pleasant 80 years old white male with a stage IIIb non-small cell lung cancer, squamous cell carcinoma diagnosed in October 2020 status post course of concurrent chemoradiation with 5 cycles of chemotherapy with carboplatin and paclitaxel. The patient tolerated his treatment well except for pancytopenia. The patient had partial response after his treatment. He is currently undergoing consolidation treatment with immunotherapy with Imfinzi 1500 mg IV every 4 weeks status post 5  cycles.   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 His treatment was discontinued because of the persistent and recurrent immunotherapy mediated pneumonitis. The patient started on a tapered dose of prednisone and over the last few weeks he felt much better with more energy and appetite as well as less cough and shortness of breath. The patient is currently on observation and he is feeling fine with no concerning complaints. He had repeat CT scan of the chest performed recently.  I personally and independently reviewed the scan images and discussed the results with the patient today. His scan showed no concerning findings for disease progression. I recommended for him to continue on observation with repeat CT scan of the chest in 4 months. For the occasional nausea, he was advised to take Compazine 30 minutes before meals if needed. He was advised to call immediately if he has any other concerning symptoms in the interval. The patient voices understanding of current disease status and treatment options and is in agreement with the current care plan.  All questions were answered. The patient knows to call the clinic with any problems, questions or concerns. We can certainly see the patient much sooner if necessary.  Disclaimer: This note was  dictated with voice recognition software. Similar sounding words can inadvertently be transcribed and may not be corrected upon review.

## 2020-09-11 LAB — SPECIMEN STATUS REPORT

## 2020-09-11 LAB — LDL CHOLESTEROL, DIRECT: LDL Direct: 62 mg/dL (ref 0–99)

## 2020-09-13 ENCOUNTER — Telehealth: Payer: Self-pay | Admitting: Internal Medicine

## 2020-09-13 NOTE — Telephone Encounter (Signed)
Scheduled per los. Called and left msg. Mailed printout  °

## 2020-09-16 ENCOUNTER — Other Ambulatory Visit: Payer: Self-pay

## 2020-09-16 ENCOUNTER — Encounter: Payer: Self-pay | Admitting: Family Medicine

## 2020-09-16 ENCOUNTER — Ambulatory Visit (INDEPENDENT_AMBULATORY_CARE_PROVIDER_SITE_OTHER): Payer: Medicare Other | Admitting: Family Medicine

## 2020-09-16 VITALS — BP 100/72 | HR 80 | Temp 97.2°F | Ht 70.0 in | Wt 206.0 lb

## 2020-09-16 DIAGNOSIS — D7589 Other specified diseases of blood and blood-forming organs: Secondary | ICD-10-CM | POA: Diagnosis not present

## 2020-09-16 DIAGNOSIS — R1110 Vomiting, unspecified: Secondary | ICD-10-CM | POA: Diagnosis not present

## 2020-09-16 MED ORDER — ONDANSETRON 4 MG PO TBDP
4.0000 mg | ORAL_TABLET | Freq: Three times a day (TID) | ORAL | 1 refills | Status: DC | PRN
Start: 2020-09-16 — End: 2022-09-15

## 2020-09-16 NOTE — Progress Notes (Addendum)
Phone [phone] In person visit   Subjective:   Martin Bailey is a 80 y.o. year old very pleasant male patient who presents for/with See problem oriented charting Chief Complaint  Patient presents with   Nausea    This visit occurred during the SARS-CoV-2 public health emergency.  Safety protocols were in place, including screening questions prior to the visit, additional usage of staff PPE, and extensive cleaning of exam room while observing appropriate contact time as indicated for disinfecting solutions.   Past Medical History-  Patient Active Problem List   Diagnosis Date Noted   Elevated coronary artery calcium score 05/02/2020    Priority: High   Stage III squamous cell carcinoma of right lung ([place]) 01/06/2019    Priority: High   Alcoholism ([place]) 06/21/2014    Priority: High   Atrial fibrillation ([place]) 03/04/2007    Priority: High   Elevated TSH 09/21/2014    Priority: Medium   Seborrheic dermatitis 07/19/2007    Priority: Medium   Hyperlipidemia 03/04/2007    Priority: Medium   Major depression ([place]) 03/04/2007    Priority: Medium   Essential hypertension 03/04/2007    Priority: Medium   Senile purpura ([place]) 09/05/2020    Priority: Low   Former smoker 06/21/2014    Priority: Low   Allergic rhinitis 04/06/2014    Priority: Low   Hx of adenomatous colonic polyps 03/05/2014    Priority: Low   Chronic anticoagulation 03/05/2014    Priority: Low   Testosterone deficiency 09/02/2010    Priority: Low   Abnormal findings on diagnostic imaging of lung 12/15/2019   Shortness of breath 12/15/2019   Drug-induced pneumonitis 09/19/2019   Encounter for antineoplastic immunotherapy 03/29/2019   Goals of care, counseling/discussion 01/06/2019   Cough 09/12/2018   Overweight 08/09/2017    Medications- reviewed and updated Current Outpatient Medications  Medication Sig Dispense Refill   amLODipine (NORVASC) 5 MG tablet TAKE 1 TABLET BY MOUTH  DAILY 90 tablet  3   atenolol (TENORMIN) 50 MG tablet TAKE 1 TABLET BY MOUTH  TWICE DAILY 180 tablet 3   budesonide-formoterol (SYMBICORT) 160-4.5 MCG/ACT inhaler Inhale into the lungs.     clobetasol (TEMOVATE) 0.05 % external solution Apply 1 application topically 2 (two) times daily. For seborrheic dermatitis- 7 days maximum (Patient taking differently: Apply 1 application topically 2 (two) times daily as needed (scalp itch).) 50 mL 0   Multiple Vitamin (MULTIVITAMIN WITH MINERALS) TABS tablet Take 1 tablet by mouth daily.     ondansetron (ZOFRAN ODT) 4 MG disintegrating tablet Take 1 tablet (4 mg total) by mouth every 8 (eight) hours as needed for nausea or vomiting. 20 tablet 1   rivaroxaban (XARELTO) 20 MG TABS tablet Take 20 mg by mouth daily.     rosuvastatin (CRESTOR) 10 MG tablet Take 1 tablet (10 mg total) by mouth daily. 90 tablet 3   triamcinolone cream (KENALOG) 0.5 % Apply topically 2 (two) times daily as needed. (Patient taking differently: Apply 1 application topically 2 (two) times daily as needed (rash).) 30 g 1   prochlorperazine (COMPAZINE) 10 MG tablet Take 1 tablet (10 mg total) by mouth every 6 (six) hours as needed for nausea or vomiting. (Patient not taking: Reported on 09/16/2020) 30 tablet 0   No current facility-administered medications for this visit.     Objective:  BP 100/72   Pulse 80   Temp (!) 97.2 F (36.2 C) (Temporal)   Ht 5\' 10"  (1.778 m)   Wt  206 lb (93.4 kg)   SpO2 99%   BMI 29.56 kg/m  Gen: NAD, resting comfortably CV: Irregularly irregular no murmurs rubs or gallops Lungs: CTAB no crackles, wheeze, rhonchi Abdomen: soft/nontender/nondistended/normal bowel sounds. No rebound or guarding.  Ext: no edema Skin: warm, dry     Assessment and Plan    # Intermittent vomiting S: From my chart message 09/09/2020 "When I saw you on Thursday, 6/9, I meant to ask about the Prochlorper prescription that I had gotten during radiation treatment.  I didn't need to use it  then, but for the last year or so I have had an occasional problem with nausea and vomiting.  It only occurs when I am having a nice dinner with one or more friends, and I had the problem this past weekend when having dinner at my daughter's house.  I took a Prochlorper pill before the meal, but still had the vomiting problem and couldn't finish the meal.  I don't know how long before the meal I must take a pill or whether I must take them several times during the day.  Is this the best medicine I can get for nausea, or is there another that might work better for me?  My daughter told me that Zofran had worked well for her for nausea during pregnancy.  I would appreciate any information you can give me about dealing with this problem.  I will be taking a trip later in the summer in which I will be eating at some nice restaurants, and I truly don't want to have that problem then."  He was given this during radiation originally but did not need. He states this always happens with nice meals with friends- happens only occasionally- meals are not richer that he is aware of. Had taken meal 30 minutes prior to the meal- only had a bite or two. Can be the same foods he would normally tolerate.Max 3-4 x a year- started about a year ago. Does not feel that nauseous ahead of time- just comes on suddenly. Has begun to feel anxious ahead of this happening. No hematemesis. In past has been able to go back and eat but not this recent time. Water does help. No pain with swallowing- no feeling of food getting stuck. No chest pain or shortness of breath with this- not exertional either- occurs at rest.   -qt interval not prolonged.  A/P: Intermittent vomiting- seems situational with nicer meals- often happens before he has much food and not particularly rich food at that. Does not always occur with the same food if not in the same social setting (has never occurred with eating alone). Compazine not helpful 30 minutes before  meal.  We opted if he tries this again to space to 45 minutes or an hour. Another option would be taking compazine every 6 hours on the day he will be having the nice meal.  Also will give zofran to trial for situational nausea/vomiting- may have anxiety component- at least fear of recurrence.   - if he has new or worsening symptoms or they begin to occur when he is alone- I would like to refer him to GI for their opinion  # macrocytosis- mild - could be related to alcohol intake and slight ast elevation on labs with oncology- discussed cutting down or out alcohol. Could also cause his itching issue.   Recommended follow up: keep decem visit Future Appointments  Date Time Provider [place]  01/07/2021 11:15 AM [facility]  LAB [facility] None  01/07/2021  1:30 PM [facility] PHARMACIST [facility]  01/09/2021 10:45 AM Curt Bears, MD [facility] None  03/13/2021  8:40 AM Yong Channel, [name], MD [facility]    Lab/Order associations:   ICD-10-CM   1. Intermittent vomiting  R11.10     2. Macrocytosis  D75.89       Meds ordered this encounter  Medications   ondansetron (ZOFRAN ODT) 4 MG disintegrating tablet    Sig: Take 1 tablet (4 mg total) by mouth every 8 (eight) hours as needed for nausea or vomiting.    Dispense:  20 tablet    Refill:  1    Time Spent: 20 minutes of total time (9:23 AM- 9:43 AM) was spent on the date of the encounter performing the following actions: chart review prior to seeing the patient, obtaining history, performing a medically necessary exam, counseling on the treatment plan, placing orders, and documenting in our EHR.   Return precautions advised.  Garret Reddish, MD

## 2020-09-16 NOTE — Patient Instructions (Addendum)
Intermittent vomiting- seems situational with nicer meals- often happens before he has much food and not particularly rich food at that. Does not always occur with the same food if not in the same social setting (has never occurred with eating alone). Compazine not helpful 30 minutes before meal.  We opted if he tries this again to space to 45 minutes or an hour. Another option would be taking compazine every 6 hours on the day he will be having the nice meal.  Also will give zofran to trial for situational nausea/vomiting- may have anxiety component- at least fear of recurrence.  - if he has new or worsening symptoms or they begin to occur when he is alone- I would like to refer him to GI for their opinion   Recommended follow up: keep December visit

## 2020-10-21 ENCOUNTER — Encounter: Payer: Self-pay | Admitting: Family Medicine

## 2020-10-21 ENCOUNTER — Other Ambulatory Visit: Payer: Self-pay

## 2020-10-21 ENCOUNTER — Ambulatory Visit (INDEPENDENT_AMBULATORY_CARE_PROVIDER_SITE_OTHER): Payer: Medicare Other | Admitting: Family Medicine

## 2020-10-21 VITALS — BP 98/60 | HR 85 | Temp 96.3°F | Resp 15 | Ht 70.0 in | Wt 206.0 lb

## 2020-10-21 DIAGNOSIS — I10 Essential (primary) hypertension: Secondary | ICD-10-CM | POA: Diagnosis not present

## 2020-10-21 DIAGNOSIS — L299 Pruritus, unspecified: Secondary | ICD-10-CM | POA: Diagnosis not present

## 2020-10-21 DIAGNOSIS — I4811 Longstanding persistent atrial fibrillation: Secondary | ICD-10-CM | POA: Diagnosis not present

## 2020-10-21 MED ORDER — METHYLPREDNISOLONE ACETATE 80 MG/ML IJ SUSP
80.0000 mg | Freq: Once | INTRAMUSCULAR | Status: AC
  Administered 2020-10-21: 80 mg via INTRAMUSCULAR

## 2020-10-21 NOTE — Patient Instructions (Addendum)
Team please give a depo medrol 80mg  injection   If this resolves symptoms within 2 weeks and gone for several months could retrial. If does not improve symptoms or only short term improvement under a month or so- consider allergist referral for their opinion.   Recommended follow up: keep December visit or sooner if needed

## 2020-10-21 NOTE — Progress Notes (Signed)
Phone [phone] In person visit   Subjective:   Martin Bailey is a 80 y.o. year old very pleasant male patient who presents for/with See problem oriented charting Chief Complaint  Patient presents with   Follow-up    Follow up on upper body itching   This visit occurred during the SARS-CoV-2 public health emergency.  Safety protocols were in place, including screening questions prior to the visit, additional usage of staff PPE, and extensive cleaning of exam room while observing appropriate contact time as indicated for disinfecting solutions.   Past Medical History-  Patient Active Problem List   Diagnosis Date Noted   Elevated coronary artery calcium score 05/02/2020    Priority: High   Stage III squamous cell carcinoma of right lung ([place]) 01/06/2019    Priority: High   Alcoholism ([place]) 06/21/2014    Priority: High   Atrial fibrillation ([place]) 03/04/2007    Priority: High   Elevated TSH 09/21/2014    Priority: Medium   Seborrheic dermatitis 07/19/2007    Priority: Medium   Hyperlipidemia 03/04/2007    Priority: Medium   Major depression ([place]) 03/04/2007    Priority: Medium   Essential hypertension 03/04/2007    Priority: Medium   Senile purpura ([place]) 09/05/2020    Priority: Low   Former smoker 06/21/2014    Priority: Low   Allergic rhinitis 04/06/2014    Priority: Low   Hx of adenomatous colonic polyps 03/05/2014    Priority: Low   Chronic anticoagulation 03/05/2014    Priority: Low   Testosterone deficiency 09/02/2010    Priority: Low   Abnormal findings on diagnostic imaging of lung 12/15/2019   Shortness of breath 12/15/2019   Drug-induced pneumonitis 09/19/2019   Encounter for antineoplastic immunotherapy 03/29/2019   Goals of care, counseling/discussion 01/06/2019   Cough 09/12/2018   Overweight 08/09/2017    Medications- reviewed and updated Current Outpatient Medications  Medication Sig Dispense Refill   amLODipine (NORVASC) 5 MG tablet  TAKE 1 TABLET BY MOUTH  DAILY 90 tablet 3   atenolol (TENORMIN) 50 MG tablet TAKE 1 TABLET BY MOUTH  TWICE DAILY 180 tablet 3   budesonide-formoterol (SYMBICORT) 160-4.5 MCG/ACT inhaler Inhale into the lungs.     clobetasol (TEMOVATE) 0.05 % external solution Apply 1 application topically 2 (two) times daily. For seborrheic dermatitis- 7 days maximum (Patient taking differently: Apply 1 application topically 2 (two) times daily as needed (scalp itch).) 50 mL 0   Multiple Vitamin (MULTIVITAMIN WITH MINERALS) TABS tablet Take 1 tablet by mouth daily.     ondansetron (ZOFRAN ODT) 4 MG disintegrating tablet Take 1 tablet (4 mg total) by mouth every 8 (eight) hours as needed for nausea or vomiting. 20 tablet 1   rivaroxaban (XARELTO) 20 MG TABS tablet Take 20 mg by mouth daily.     triamcinolone cream (KENALOG) 0.5 % Apply topically 2 (two) times daily as needed. (Patient taking differently: Apply 1 application topically 2 (two) times daily as needed (rash).) 30 g 1   rosuvastatin (CRESTOR) 10 MG tablet Take 1 tablet (10 mg total) by mouth daily. 90 tablet 3   No current facility-administered medications for this visit.     Objective:  BP 98/60 (BP Location: Left Arm, Patient Position: Sitting, Cuff Size: Normal)   Pulse 85   Temp (!) 96.3 F (35.7 C) (Temporal)   Resp 15   Ht 5\' 10"  (1.778 m)   Wt 206 lb (93.4 kg)   SpO2 96%   BMI 29.56  kg/m  Gen: NAD, resting comfortably CV: RRR no murmurs rubs or gallops Lungs: CTAB no crackles, wheeze, rhonchi  Ext: no edema Skin: warm, dry    Assessment and Plan   # Skin irritation/pruritis S: Initially this occurred after initial cancer treatment-he stopped chemotherapy due to intense itching which was a potential side effect.  Symptoms later recurred 4 months ago.  He went to a dermatologist at that time.  No specific cause was found-he was given a steroid shot, body oil rub and Allegra was recommended.  At last visit he reported this did not help  much but then reflects back that symptoms did improve within 2 weeks of steroid shot (today he recounts).   In early June he reported symptoms have recurred starting about 2 weeks prior to visit.  We discussed at last visit could be due to medications, alcohol intake, sensitivity to food or something in his environment.  Likely LFTs were reassuring other than mild AST elevation-recommended alcohol cessation once again.  We also recommended cutting on alcohol to see if this would help. He was also to retrial allegra. We also discussed possible repeat steroid injection  Today reports retrialed body oil with no help. Tried allegra for 3-4 weeks with no benefit. He has not cut down on alcohol and states nothing will change- would like for me to stop asking. No rash- does get irritation after scratching.   Always itches somewhere but not usually everywhere at once- usually one intense area then will switch spots/switch sides- has alos noted down into his upper legs now- previously all waist up. Can sleep but wakes up very itchy A/P: patient with intermittent issues with pruritis since starting chemotherapy in 2020 even though chemotherapy was stopped  No clear trigger for episodes. No relief with antihistamines. Gets a single intense itching spot on upper body that later moves to a new location - no rash until he scratches area eventually. Unclear trigger but responded well to prior steroid injection with dermatology. Will trial Depo-Medrol 80 mg today.  We discussed if no improvement within 2 weeks or has sustained improvement for at least 3 months we will likely refer to allergist.  Could also consider having him see his dermatologist again but without obvious rash I think this would be less helpful - vacation in 3 weeks plans-we are hopeful he will feel better prior to vacation -Possible alcohol is playing a role but patient is not willing to cut down and he asked me to be less persistent about this issue-I  will try to respect his space on this  #Persistent atrial fibrillation S: Rate controlled with atenolol 50mg  Anticoagulated with Xarelto 20mg  Patient is not followed by cardiology:   A/P:any of his medications could be potential cause for his pruritus but I do not think we have space to change in his medications particularly A. fib medications-continue atenolol for rate control and Xarelto for anticoagulation  #hypertension S: medication:  atenolol 50mg , amlodipine 5 mg- half tablet trial 09/05/20 Home readings #s: Blood pressure running lower this morning but he reports just gotten from exercise A/P: Well-controlled-continue current medication  Recommended follow up: No follow-ups on file. Future Appointments  Date Time Provider [place]  01/07/2021 11:15 AM [facility] LAB [facility] None  01/07/2021  1:30 PM [facility] CCM PHARMACIST [facility]  01/09/2021 10:45 AM Curt Bears, MD [facility] None  03/13/2021  8:40 AM Yong Channel [name], MD [facility]    Lab/Order associations:   ICD-10-CM   1.  Pruritus  L29.9 methylPREDNISolone acetate (DEPO-MEDROL) injection 80 mg    2. Longstanding persistent atrial fibrillation (HCC)  I48.11     3. Essential hypertension  I10       Meds ordered this encounter  Medications   methylPREDNISolone acetate (DEPO-MEDROL) injection 80 mg   Return precautions advised.  Garret Reddish, MD

## 2020-11-05 DIAGNOSIS — H353132 Nonexudative age-related macular degeneration, bilateral, intermediate dry stage: Secondary | ICD-10-CM | POA: Diagnosis not present

## 2020-11-05 DIAGNOSIS — H43813 Vitreous degeneration, bilateral: Secondary | ICD-10-CM | POA: Diagnosis not present

## 2020-11-05 DIAGNOSIS — H524 Presbyopia: Secondary | ICD-10-CM | POA: Diagnosis not present

## 2020-11-05 DIAGNOSIS — Z961 Presence of intraocular lens: Secondary | ICD-10-CM | POA: Diagnosis not present

## 2020-11-19 ENCOUNTER — Telehealth: Payer: Self-pay | Admitting: Pharmacist

## 2020-11-19 NOTE — Chronic Care Management (AMB) (Addendum)
    Chronic Care Management Pharmacy Assistant   Name: Martin Bailey  MRN: [id number] DOB: 07-19-1940   Reason for Encounter: General Adherence Call    Recent office visits:  10/21/2020 Wonda Cerise, MD; no medication changes indicated.  09/16/2020 Wonda Cerise, MD; compazine every 6 hours on the day he will be having a nice meal, also zofran to trial for situational nausea/vomiting- may have anxiety component.  Recent consult visits:  09/10/2020 OV (oncology) Curt Bears, MD; malignant neoplasm of unspecified part of bronchus or lung, his treatment was discontinued due to persistent and recurrent immunotherapy mediated pneumonitis. Compazine 30 minutes before meals if needed.  Hospital visits:  None in previous 6 months  Medications: Outpatient Encounter Medications as of 11/19/2020  Medication Sig Note   amLODipine (NORVASC) 5 MG tablet TAKE 1 TABLET BY MOUTH  DAILY    atenolol (TENORMIN) 50 MG tablet TAKE 1 TABLET BY MOUTH  TWICE DAILY    budesonide-formoterol (SYMBICORT) 160-4.5 MCG/ACT inhaler Inhale into the lungs.    clobetasol (TEMOVATE) 0.05 % external solution Apply 1 application topically 2 (two) times daily. For seborrheic dermatitis- 7 days maximum (Patient taking differently: Apply 1 application topically 2 (two) times daily as needed (scalp itch).)    Multiple Vitamin (MULTIVITAMIN WITH MINERALS) TABS tablet Take 1 tablet by mouth daily.    ondansetron (ZOFRAN ODT) 4 MG disintegrating tablet Take 1 tablet (4 mg total) by mouth every 8 (eight) hours as needed for nausea or vomiting.    rivaroxaban (XARELTO) 20 MG TABS tablet Take 20 mg by mouth daily. 01/09/2020: Last dose 01/03/20   rosuvastatin (CRESTOR) 10 MG tablet Take 1 tablet (10 mg total) by mouth daily.    triamcinolone cream (KENALOG) 0.5 % Apply topically 2 (two) times daily as needed. (Patient taking differently: Apply 1 application topically 2 (two) times daily as needed (rash).)     No facility-administered encounter medications on file as of 11/19/2020.    Patient Questions: Have you had any problems recently with your health? Patient states he hasn't had any new problems. He states he is still having trouble with itching. He recently had a steroid injection and states it didn't help his itching so he is going to see an allergist for further evaluation.  Have you had any problems with your pharmacy? Patient states he has not had any problems with his pharmacy.  What issues or side effects are you having with your medications? Patient states he has not had any issues or side effects with any of his medications that he is aware of at this time.  What would you like me to pass along to Leata Mouse, CPP for him to help you with?  Patient states he does not have anything to pass along at this time.  What can we do to take care of you better? Patient did not have any suggestions.  Future Appointments  Date Time Provider [place]  01/07/2021 11:15 AM [facility] LAB [facility] None  01/07/2021  1:30 PM [facility] CCM PHARMACIST [facility]  01/09/2021 10:45 AM Curt Bears, MD [facility] None  03/13/2021  8:40 AM [name], MD [facility]    Star Rating Drugs: Rosuvastatin 10 mg last filled 08/12/2020 90 DS  April D Calhoun, [place] Pharmacist Assistant [phone]  10 minutes spent in review, coordination, and documentation.  Reviewed by: Beverly Milch, PharmD Clinical Pharmacist [phone]

## 2020-11-25 ENCOUNTER — Encounter: Payer: Self-pay | Admitting: Family Medicine

## 2020-11-27 ENCOUNTER — Other Ambulatory Visit: Payer: Self-pay

## 2020-11-27 DIAGNOSIS — L299 Pruritus, unspecified: Secondary | ICD-10-CM

## 2020-12-10 DIAGNOSIS — L299 Pruritus, unspecified: Secondary | ICD-10-CM | POA: Diagnosis not present

## 2020-12-31 ENCOUNTER — Encounter: Payer: Self-pay | Admitting: Internal Medicine

## 2021-01-07 ENCOUNTER — Inpatient Hospital Stay: Payer: Medicare Other

## 2021-01-07 ENCOUNTER — Ambulatory Visit (HOSPITAL_COMMUNITY)
Admission: RE | Admit: 2021-01-07 | Discharge: 2021-01-07 | Disposition: A | Discharge status: Home / Self Care | Payer: Medicare Other | Source: Ambulatory Visit | Attending: Internal Medicine | Admitting: Internal Medicine

## 2021-01-07 ENCOUNTER — Ambulatory Visit (INDEPENDENT_AMBULATORY_CARE_PROVIDER_SITE_OTHER): Payer: Medicare Other | Admitting: Pharmacist

## 2021-01-07 ENCOUNTER — Other Ambulatory Visit: Payer: Self-pay

## 2021-01-07 ENCOUNTER — Inpatient Hospital Stay: Payer: Medicare Other | Attending: Internal Medicine

## 2021-01-07 DIAGNOSIS — I700 Atherosclerosis of aorta: Secondary | ICD-10-CM | POA: Diagnosis not present

## 2021-01-07 DIAGNOSIS — J90 Pleural effusion, not elsewhere classified: Secondary | ICD-10-CM | POA: Diagnosis not present

## 2021-01-07 DIAGNOSIS — J479 Bronchiectasis, uncomplicated: Secondary | ICD-10-CM | POA: Diagnosis not present

## 2021-01-07 DIAGNOSIS — J439 Emphysema, unspecified: Secondary | ICD-10-CM | POA: Diagnosis not present

## 2021-01-07 DIAGNOSIS — C3490 Malignant neoplasm of unspecified part of unspecified bronchus or lung: Secondary | ICD-10-CM | POA: Diagnosis not present

## 2021-01-07 DIAGNOSIS — I10 Essential (primary) hypertension: Secondary | ICD-10-CM

## 2021-01-07 DIAGNOSIS — I4811 Longstanding persistent atrial fibrillation: Secondary | ICD-10-CM

## 2021-01-07 LAB — CBC WITH DIFFERENTIAL (CANCER CENTER ONLY)
Abs Immature Granulocytes: 0.08 10*3/uL — ABNORMAL HIGH (ref 0.00–0.07)
Basophils Absolute: 0 10*3/uL (ref 0.0–0.1)
Basophils Relative: 1 %
Eosinophils Absolute: 0.4 10*3/uL (ref 0.0–0.5)
Eosinophils Relative: 5 %
HCT: 43 % (ref 39.0–52.0)
Hemoglobin: 14.6 g/dL (ref 13.0–17.0)
Immature Granulocytes: 1 %
Lymphocytes Relative: 13 %
Lymphs Abs: 1 10*3/uL (ref 0.7–4.0)
MCH: 34 pg (ref 26.0–34.0)
MCHC: 34 g/dL (ref 30.0–36.0)
MCV: 100.2 fL — ABNORMAL HIGH (ref 80.0–100.0)
Monocytes Absolute: 0.9 10*3/uL (ref 0.1–1.0)
Monocytes Relative: 11 %
Neutro Abs: 5.3 10*3/uL (ref 1.7–7.7)
Neutrophils Relative %: 69 %
Platelet Count: 131 10*3/uL — ABNORMAL LOW (ref 150–400)
RBC: 4.29 MIL/uL (ref 4.22–5.81)
RDW: 13.6 % (ref 11.5–15.5)
WBC Count: 7.7 10*3/uL (ref 4.0–10.5)
nRBC: 0 % (ref 0.0–0.2)

## 2021-01-07 LAB — CMP (CANCER CENTER ONLY)
ALT: 25 U/L (ref 0–44)
AST: 26 U/L (ref 15–41)
Albumin: 4.1 g/dL (ref 3.5–5.0)
Alkaline Phosphatase: 56 U/L (ref 38–126)
Anion gap: 9 (ref 5–15)
BUN: 20 mg/dL (ref 8–23)
CO2: 25 mmol/L (ref 22–32)
Calcium: 9.5 mg/dL (ref 8.9–10.3)
Chloride: 105 mmol/L (ref 98–111)
Creatinine: 1.08 mg/dL (ref 0.61–1.24)
GFR, Estimated: >60 mL/min (ref >60)
Glucose, Bld: 94 mg/dL (ref 70–99)
Potassium: 5.3 mmol/L — ABNORMAL HIGH (ref 3.5–5.1)
Sodium: 139 mmol/L (ref 135–145)
Total Bilirubin: 0.9 mg/dL (ref 0.3–1.2)
Total Protein: 7.2 g/dL (ref 6.5–8.1)

## 2021-01-07 MED ORDER — IOHEXOL 350 MG/ML SOLN
60.0000 mL | Freq: Once | INTRAVENOUS | Status: AC | PRN
  Administered 2021-01-07: 60 mL via INTRAVENOUS

## 2021-01-07 MED ORDER — HEPARIN SOD (PORK) LOCK FLUSH 100 UNIT/ML IV SOLN: 500.0000 [IU] | Freq: Once | INTRAVENOUS | Status: AC

## 2021-01-07 MED ORDER — HEPARIN SOD (PORK) LOCK FLUSH 100 UNIT/ML IV SOLN
INTRAVENOUS | Status: AC
  Administered 2021-01-07: 500 [IU] via INTRAVENOUS
  Filled 2021-01-07: qty 5

## 2021-01-07 NOTE — Progress Notes (Signed)
Chronic Care Management Pharmacy Note  01/09/2021 Name:  Martin Bailey MRN:  [id number] DOB:  07-06-1940   Subjective: Martin Bailey is an 80 y.o. year old male who is a primary patient of Hunter, [name], MD.  The CCM team was consulted for assistance with disease management and care coordination needs.    Engaged with patient by telephone for follow up visit in response to provider referral for pharmacy case management and/or care coordination services.   Consent to Services:  The patient was given the following information about Chronic Care Management services today, agreed to services, and gave verbal consent: 1. CCM service includes personalized support from designated clinical staff supervised by the primary care provider, including individualized plan of care and coordination with other care providers 2. 24/7 contact phone numbers for assistance for urgent and routine care needs. 3. Service will only be billed when office clinical staff spend 20 minutes or more in a month to coordinate care. 4. Only one practitioner may furnish and bill the service in a calendar month. 5.The patient may stop CCM services at any time (effective at the end of the month) by phone call to the office staff. 6. The patient will be responsible for cost sharing (co-pay) of up to 20% of the service fee (after annual deductible is met). Patient agreed to services and consent obtained.  Patient Care Team: [name], MD as PCP - General (Family Medicine) Donato Heinz, MD as PCP - Cardiology (Cardiology) Jule Ser as Consulting Physician (Dentistry) Valrie Hart, RN as Oncology Nurse Navigator Edythe Clarity, [facility] (Pharmacist)  Recent office visits:  10/21/2020 Wonda Cerise, MD; no medication changes indicated.   09/16/2020 Wonda Cerise, MD; compazine every 6 hours on the day he will be having a nice meal, also zofran to trial for situational  nausea/vomiting- may have anxiety component.   Recent consult visits:  09/10/2020 OV (oncology) Curt Bears, MD; malignant neoplasm of unspecified part of bronchus or lung, his treatment was discontinued due to persistent and recurrent immunotherapy mediated pneumonitis. Compazine 30 minutes before meals if needed.   Hospital visits:  None in previous 6 months   Objective:  Lab Results  Component Value Date   CREATININE 1.08 01/07/2021   BUN 20 01/07/2021   GFR 90.73 02/10/2018   GFRNONAA >60 01/07/2021   GFRAA >60 12/20/2019   NA 139 01/07/2021   K 5.3 (H) 01/07/2021   CALCIUM 9.5 01/07/2021   CO2 25 01/07/2021   GLUCOSE 94 01/07/2021    Lab Results  Component Value Date/Time   HGBA1C 5.5 06/28/2014 08:44 AM   HGBA1C 5.7 04/12/2013 07:54 AM   GFR 90.73 02/10/2018 09:04 AM   GFR 84.24 02/09/2017 08:55 AM    Last diabetic Eye exam: No results found for: HMDIABEYEEXA  Last diabetic Foot exam: No results found for: HMDIABFOOTEX   Lab Results  Component Value Date   CHOL 210 (H) 02/19/2020   HDL 67 02/19/2020   LDLCALC 125 (H) 02/19/2020   LDLDIRECT 62 09/09/2020   TRIG 101 02/19/2020   CHOLHDL 3.1 02/19/2020    Hepatic Function Latest Ref Rng & Units 01/07/2021 09/09/2020 05/10/2020  Total Protein 6.5 - 8.1 g/dL 7.2 7.0 7.2  Albumin 3.5 - 5.0 g/dL 4.1 3.9 4.0  AST 15 - 41 U/L 26 49(H) 27  ALT 0 - 44 U/L '25 22 31  ' Alk Phosphatase 38 - 126 U/L 56 54 60  Total Bilirubin  0.3 - 1.2 mg/dL 0.9 0.9 0.6  Bilirubin, Direct 0.0 - 0.3 mg/dL - - -    Lab Results  Component Value Date/Time   TSH 1.959 01/08/2020 12:33 PM   TSH 2.508 12/20/2019 10:28 AM   TSH 3.63 02/10/2018 09:04 AM   TSH 4.90 (H) 02/09/2017 08:55 AM   FREET4 0.85 12/21/2014 09:39 AM    CBC Latest Ref Rng & Units 01/07/2021 09/09/2020 05/10/2020  WBC 4.0 - 10.5 K/uL 7.7 8.0 11.8(H)  Hemoglobin 13.0 - 17.0 g/dL 14.6 13.8 15.6  Hematocrit 39.0 - 52.0 % 43.0 40.0 47.8  Platelets 150 - 400 K/uL 131(L)  146(L) 193    No results found for: VD25OH  Clinical ASCVD: No  The 10-year ASCVD risk score (Arnett DK, et al., 2019) is: 21%   Values used to calculate the score:     Age: 43 years     Sex: Male     Is Non-Hispanic African American: No     Diabetic: No     Tobacco smoker: No     Systolic Blood Pressure: 98 mmHg     Is BP treated: Yes     HDL Cholesterol: 67 mg/dL     Total Cholesterol: 210 mg/dL    Depression screen [facility] 2/9 10/21/2020 09/05/2020 05/02/2020  Decreased Interest 0 0 0  Down, Depressed, Hopeless 0 0 2  PHQ - 2 Score 0 0 2  Altered sleeping - 0 0  Tired, decreased energy - 0 3  Change in appetite - 0 0  Feeling bad or failure about yourself  - 0 2  Trouble concentrating - 0 0  Moving slowly or fidgety/restless - 0 0  Suicidal thoughts - 0 0  PHQ-9 Score - 0 7  Difficult doing work/chores Not difficult at all Not difficult at all Somewhat difficult  Some recent data might be hidden     Social History   Tobacco Use  Smoking Status Former   Packs/day: 1.50   Years: 10.00   Pack years: 15.00   Types: Cigarettes   Quit date: 07/08/1977   Years since quitting: 43.5  Smokeless Tobacco Never   BP Readings from Last 3 Encounters:  10/21/20 98/60  09/16/20 100/72  09/10/20 114/72   Pulse Readings from Last 3 Encounters:  10/21/20 85  09/16/20 80  09/10/20 100   Wt Readings from Last 3 Encounters:  10/21/20 206 lb (93.4 kg)  09/16/20 206 lb (93.4 kg)  09/10/20 206 lb (93.4 kg)   BMI Readings from Last 3 Encounters:  10/21/20 29.56 kg/m  09/16/20 29.56 kg/m  09/10/20 29.56 kg/m    Assessment/Interventions: Review of patient past medical history, allergies, medications, health status, including review of consultants reports, laboratory and other test data, was performed as part of comprehensive evaluation and provision of chronic care management services.   SDOH:  (Social Determinants of Health) assessments and interventions performed:  Yes  Financial Resource Strain: Not on file    SDOH Screenings   Alcohol Screen: Not on file  Depression (PHQ2-9): Low Risk    PHQ-2 Score: 0  Financial Resource Strain: Not on file  Food Insecurity: No Food Insecurity   Worried About Charity fundraiser in the Last Year: Never true   Ran Out of Food in the Last Year: Never true  Housing: Not on file  Physical Activity: Not on file  Social Connections: Not on file  Stress: Not on file  Tobacco Use: Medium Risk   Smoking Tobacco Use:  Former   Smokeless Tobacco Use: Never  Transportation Needs: Not on file    [place]  Allergies  Allergen Reactions   Hydrocodone Other (See Comments)    Lost my appetite, loss of energy     Medications Reviewed Today     Reviewed by Edythe Clarity, [facility] (Pharmacist) on 01/09/21 at [place] List Status: <None>   Medication Order Taking? Sig Documenting Provider Last Dose Status Informant  amLODipine (NORVASC) 5 MG tablet [id number] Yes TAKE 1 TABLET BY MOUTH  DAILY [name], MD Taking Active   atenolol (TENORMIN) 50 MG tablet [id number] Yes TAKE 1 TABLET BY MOUTH  TWICE DAILY [name], MD Taking Active   budesonide-formoterol [facility]) 160-4.5 MCG/ACT inhaler [id number] Yes Inhale into the lungs. [provider] Taking Active   clobetasol (TEMOVATE) 0.05 % external solution [id number] Yes Apply 1 application topically 2 (two) times daily. For seborrheic dermatitis- 7 days maximum  Patient taking differently: Apply 1 application topically 2 (two) times daily as needed (scalp itch).   [name], MD Taking Active   desloratadine (CLARINEX) 5 MG tablet [id number] Yes Take 5 mg by mouth daily. [provider] Taking Active   famotidine (PEPCID) 20 MG tablet [id number] Yes Take 20 mg by mouth 2 (two) times daily. [provider] Taking Active   levocetirizine (XYZAL) 5 MG tablet [id number] Yes Take 5 mg by mouth every evening. [provider] Taking Active   Multiple Vitamin (MULTIVITAMIN WITH MINERALS) TABS tablet [id number] Yes Take 1 tablet by mouth daily. [provider] Taking Active Self  ondansetron (ZOFRAN ODT) 4 MG disintegrating tablet [id number] Yes Take 1 tablet (4 mg total) by mouth every 8 (eight) hours as needed for nausea or vomiting. [name], MD Taking Active   rivaroxaban (XARELTO) 20 MG TABS tablet [id number] Yes Take 20 mg by mouth daily. [provider] Taking Active            Med Note Izetta Dakin   Tue Jan 09, 2020  6:47 AM) Last dose 01/03/20  rosuvastatin (CRESTOR) 10 MG tablet [id number]  Take 1 tablet (10 mg total) by mouth daily. Donato Heinz, MD  Expired 09/16/20 2359   triamcinolone cream (KENALOG) 0.5 % [id number] Yes Apply topically 2 (two) times daily as needed.  Patient taking differently: Apply 1 application topically 2 (two) times daily as needed (rash).   [name], MD Taking Active             Patient Active Problem List   Diagnosis Date Noted   Senile purpura ([place]) 09/05/2020   Elevated coronary artery calcium score 05/02/2020   Abnormal findings on diagnostic imaging of lung 12/15/2019   Shortness of breath 12/15/2019   Drug-induced pneumonitis 09/19/2019   Encounter for antineoplastic immunotherapy 03/29/2019   Stage III squamous cell carcinoma of right lung ([place]) 01/06/2019   Goals of care, counseling/discussion 01/06/2019   Cough 09/12/2018   Overweight 08/09/2017   Elevated TSH 09/21/2014   Former smoker 06/21/2014   Alcoholism ([place]) 06/21/2014   Allergic rhinitis 04/06/2014   Hx of adenomatous colonic polyps 03/05/2014   Chronic anticoagulation 03/05/2014   Testosterone deficiency 09/02/2010   Seborrheic dermatitis 07/19/2007   Hyperlipidemia 03/04/2007   Major depression ([place]) 03/04/2007   Essential hypertension 03/04/2007   Atrial fibrillation ([place]) 03/04/2007    Immunization History  Administered  Date(s) Administered   Fluad Quad(high Dose 65+) 01/16/2019   Influenza Nasal 01/07/2020  Influenza Split 12/22/2011   Influenza Whole 01/18/2008, 01/04/2009   Influenza, High Dose Seasonal PF 01/02/2016, 01/21/2017, 02/10/2018   Influenza,inj,Quad PF,6+ Mos 12/23/2012, 02/20/2014, 12/21/2014   Moderna Sars-Covid-2 Vaccination 04/29/2019, 05/27/2019, 01/31/2020, 07/17/2020   Pneumococcal Conjugate-13 06/21/2014   Pneumococcal Polysaccharide-23 01/18/2008   Td 11/21/2001   Tdap 12/22/2011   Zoster Recombinat (Shingrix) 02/03/2018, 04/07/2018   Zoster, Live 01/18/2008    Conditions to be addressed/monitored:  HTN, Afib, HLD, Lung Cancer  Care Plan : General Pharmacy (Adult)  Updates made by Edythe Clarity, RPH since 01/09/2021 12:00 AM     Problem: HTN, HLD, Afib   Priority: High  Onset Date: 01/09/2021     Long-Range Goal: Patient-Specific Goal   Start Date: 01/09/2021  Expected End Date: 07/10/2021  This Visit's Progress: On track  Priority: High  Note:   Current Barriers:  None at this time  Pharmacist Clinical Goal(s):  Patient will maintain control of BP as evidenced by monitoring  through collaboration with PharmD and provider.   Interventions: 1:1 collaboration with [name], MD regarding development and update of comprehensive plan of care as evidenced by provider attestation and co-signature Inter-disciplinary care team collaboration (see longitudinal plan of care) Comprehensive medication review performed; medication list updated in electronic medical record  Hypertension (BP goal <130/80) -Controlled -Current treatment: Amlodipine 24m daily Atenolol 587mBID -Medications previously tried: none noted  -Current home readings: 123/85 today -Current exercise habits: gym 3x per week (45 min cardio, 15 weights) -Denies hypotensive/hypertensive symptoms -Educated on BP goals and benefits of medications for prevention of heart attack, stroke and  kidney damage; Daily salt intake goal < 2300 mg; Exercise goal of 150 minutes per week; -Counseled to monitor BP at home weekly, document, and provide log at future appointments -Recommended to continue current medication Continue active lifestyle and medication adherence  Atrial Fibrillation (Goal: prevent stroke and major bleeding) -Controlled -Current treatment: Rate control: Atenolol 5090mID Anticoagulation: Xarelto 83m62mily -Medications previously tried: Pradaxa -Home BP and HR readings: "controlled"  -Counseled on increased risk of stroke due to Afib and benefits of anticoagulation for stroke prevention; bleeding risk associated with Xarelto and importance of self-monitoring for signs/symptoms of bleeding; -Recommended to continue current medication Assessed patient finances. Patient copay is currently manageable. Discussed medication adherence  Patient Goals/Self-Care Activities Patient will:  - take medications as prescribed focus on medication adherence by pill counts check blood pressure weekly, document, and provide at future appointments  Follow Up Plan: The care management team will reach out to the patient again over the next 180 days.        Medication Assistance: None required.  Patient affirms current coverage meets needs.  Compliance/Adherence/Medication fill history: Care Gaps: Flu vaccine  Patient's preferred pharmacy is:  [facility]: [phone]: [phone] (MAILKuk[place]haeleEC[place] -[place]ne: [phone]: [phone] Mail Service  ([facility] [facility] [place]ne: [phone]: [phone] discussed: Benefits of medication  synchronization, packaging and delivery as well as enhanced pharmacist oversight with [employer]. Patient decided to: Continue current medication management strategy  Care Plan and Follow Up Patient Decision:  Patient agrees to Care Plan and Follow-up.  Plan: The care management team will reach  out to the patient again over the next 120 days.  Beverly Milch, PharmD Clinical Pharmacist [place] [phone]

## 2021-01-09 ENCOUNTER — Inpatient Hospital Stay: Payer: Medicare Other | Admitting: Internal Medicine

## 2021-01-09 ENCOUNTER — Other Ambulatory Visit: Payer: Self-pay

## 2021-01-09 VITALS — BP 115/81 | HR 69 | Temp 97.1°F | Resp 19 | Ht 70.0 in | Wt 209.3 lb

## 2021-01-09 DIAGNOSIS — M4854XA Collapsed vertebra, not elsewhere classified, thoracic region, initial encounter for fracture: Secondary | ICD-10-CM | POA: Diagnosis not present

## 2021-01-09 DIAGNOSIS — Z9221 Personal history of antineoplastic chemotherapy: Secondary | ICD-10-CM | POA: Diagnosis not present

## 2021-01-09 DIAGNOSIS — C3490 Malignant neoplasm of unspecified part of unspecified bronchus or lung: Secondary | ICD-10-CM | POA: Diagnosis not present

## 2021-01-09 DIAGNOSIS — Z923 Personal history of irradiation: Secondary | ICD-10-CM | POA: Insufficient documentation

## 2021-01-09 DIAGNOSIS — J470 Bronchiectasis with acute lower respiratory infection: Secondary | ICD-10-CM | POA: Diagnosis not present

## 2021-01-09 DIAGNOSIS — C3491 Malignant neoplasm of unspecified part of right bronchus or lung: Secondary | ICD-10-CM

## 2021-01-09 DIAGNOSIS — D61810 Antineoplastic chemotherapy induced pancytopenia: Secondary | ICD-10-CM | POA: Diagnosis not present

## 2021-01-09 DIAGNOSIS — C3431 Malignant neoplasm of lower lobe, right bronchus or lung: Secondary | ICD-10-CM | POA: Insufficient documentation

## 2021-01-09 NOTE — Progress Notes (Signed)
[place] [phone]   [phone]  OFFICE PROGRESS NOTE  [name], MD [place] [address]  DIAGNOSIS: Stage IIIb (T3, N2, M0) non-small cell lung cancer, squamous cell carcinoma presented with right lower lobe lung mass in addition to subcarinal lymphadenopathy diagnosed in October 2020.    PRIOR THERAPY:  1) Weekly concurrent chemoradiation with Carboplatin for an AUC of 2 Paclitaxel 45 mg/m2. First dose 01/16/2019. Status post 5 cycles.  Last dose was given February 13, 2019. 2) Consolidation treatment with immunotherapy with Imfinzi 1500 mg IV every 4 weeks.  First dose April 05, 2019.  Status post 5 cycles.  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  CURRENT THERAPY:  Observation.  INTERVAL HISTORY: Martin Bailey 80 y.o. male returns to the clinic today for 51-month follow-up visit.  The patient is feeling fine today with no concerning complaints except for mild shortness of breath and itching.  He denied having any chest pain, cough or hemoptysis.  He denied having any recent weight loss or night sweats.  He has no nausea, vomiting, diarrhea or constipation.  He has no headache or visual changes.  He is here today for evaluation with repeat CT scan of the chest for restaging of his disease.   MEDICAL HISTORY: Past Medical History:  Diagnosis Date   Cataract    left   COLONIC POLYPS, HX OF 04/05/2007   ONE ADENOMATOUS POLYP AND ONE HYPERPLASTIC POLYP   DEPRESSION 03/04/2007   DERMATITIS, SEBORRHEIC 07/19/2007   FIBRILLATION, ATRIAL 03/04/2007   Hearing aid worn    B/L   HYPERLIPIDEMIA 03/04/2007   HYPERTENSION 03/04/2007   Kidney stones    Lung cancer ([place]) dx'd 02/2019   non small cell lung ca   Lung mass    mediastinal adenopathy    ALLERGIES:  is allergic to hydrocodone.  MEDICATIONS:  Current Outpatient Medications  Medication Sig Dispense Refill    amLODipine (NORVASC) 5 MG tablet TAKE 1 TABLET BY MOUTH  DAILY 90 tablet 3   atenolol (TENORMIN) 50 MG tablet TAKE 1 TABLET BY MOUTH  TWICE DAILY 180 tablet 3   budesonide-formoterol (SYMBICORT) 160-4.5 MCG/ACT inhaler Inhale into the lungs.     clobetasol (TEMOVATE) 0.05 % external solution Apply 1 application topically 2 (two) times daily. For seborrheic dermatitis- 7 days maximum (Patient taking differently: Apply 1 application topically 2 (two) times daily as needed (scalp itch).) 50 mL 0   desloratadine (CLARINEX) 5 MG tablet Take 5 mg by mouth daily.     famotidine (PEPCID) 20 MG tablet Take 20 mg by mouth 2 (two) times daily.     levocetirizine (XYZAL) 5 MG tablet Take 5 mg by mouth every evening.     Multiple Vitamin (MULTIVITAMIN WITH MINERALS) TABS tablet Take 1 tablet by mouth daily.     ondansetron (ZOFRAN ODT) 4 MG disintegrating tablet Take 1 tablet (4 mg total) by mouth every 8 (eight) hours as needed for nausea or vomiting. 20 tablet 1   rivaroxaban (XARELTO) 20 MG TABS tablet Take 20 mg by mouth daily.     rosuvastatin (CRESTOR) 10 MG tablet Take 1 tablet (10 mg total) by mouth daily. 90 tablet 3   triamcinolone cream (KENALOG) 0.5 % Apply topically 2 (two) times daily as needed. (Patient taking differently: Apply 1 application topically 2 (two) times daily as needed (rash).) 30 g 1   No current facility-administered medications for this  visit.    SURGICAL HISTORY:  Past Surgical History:  Procedure Laterality Date   BIOPSY  01/09/2020   Procedure: BIOPSY;  Surgeon: Laurin Coder, MD;  Location: [facility];  Service: Endoscopy;;   BRONCHIAL WASHINGS  01/09/2020   Procedure: BRONCHIAL WASHINGS;  Surgeon: Laurin Coder, MD;  Location: [facility];  Service: Endoscopy;;   CATARACT EXTRACTION W/ INTRAOCULAR LENS IMPLANT     right   COLONOSCOPY     IR IMAGING GUIDED PORT INSERTION  01/31/2019   RADIOFREQUENCY ABLATION     x2 for fib, on 2nd attempt switched form a  flutter ot a fib.    ROTATOR CUFF REPAIR Right    TONSILLECTOMY     VIDEO BRONCHOSCOPY N/A 01/09/2020   Procedure: VIDEO BRONCHOSCOPY WITH FLUORO;  Surgeon: Laurin Coder, MD;  Location: [facility];  Service: Endoscopy;  Laterality: N/A;   VIDEO BRONCHOSCOPY WITH ENDOBRONCHIAL ULTRASOUND Left 12/30/2018   Procedure: VIDEO BRONCHOSCOPY WITH ENDOBRONCHIAL ULTRASOUND WITH FLUOROSCOPY;  Surgeon: Laurin Coder, MD;  Location: [facility];  Service: Thoracic;  Laterality: Left;   WISDOM TOOTH EXTRACTION      REVIEW OF SYSTEMS:  A comprehensive review of systems was negative except for: Respiratory: positive for dyspnea on exertion Integument/breast: positive for pruritus   PHYSICAL EXAMINATION: General appearance: alert, cooperative, and no distress Head: Normocephalic, without obvious abnormality, atraumatic Neck: no adenopathy, no JVD, supple, symmetrical, trachea midline, and thyroid not enlarged, symmetric, no tenderness/mass/nodules Lymph nodes: Cervical, supraclavicular, and axillary nodes normal. Resp: clear to auscultation bilaterally Back: symmetric, no curvature. ROM normal. No CVA tenderness. Cardio: regular rate and rhythm, S1, S2 normal, no murmur, click, rub or gallop GI: soft, non-tender; bowel sounds normal; no masses,  no organomegaly Extremities: extremities normal, atraumatic, no cyanosis or edema  ECOG PERFORMANCE STATUS: 1 - Symptomatic but completely ambulatory  Blood pressure 115/81, pulse 69, temperature (!) 97.1 F (36.2 C), temperature source Tympanic, resp. rate 19, height 5\' 10"  (1.778 m), weight 209 lb 4.8 oz (94.9 kg), SpO2 97 %.  LABORATORY DATA: Lab Results  Component Value Date   WBC 7.7 01/07/2021   HGB 14.6 01/07/2021   HCT 43.0 01/07/2021   MCV 100.2 (H) 01/07/2021   PLT 131 (L) 01/07/2021      Chemistry      Component Value Date/Time   NA 139 01/07/2021 1548   K 5.3 (H) 01/07/2021 1548   CL 105 01/07/2021 1548   CO2 25 01/07/2021 1548    BUN 20 01/07/2021 1548   CREATININE 1.08 01/07/2021 1548      Component Value Date/Time   CALCIUM 9.5 01/07/2021 1548   ALKPHOS 56 01/07/2021 1548   AST 26 01/07/2021 1548   ALT 25 01/07/2021 1548   BILITOT 0.9 01/07/2021 1548       RADIOGRAPHIC STUDIES: CT Chest W Contrast  Result Date: 01/09/2021 CLINICAL DATA:  80 year old male with history of non-small cell lung cancer. History of prior chemo radiation therapy, completed in February 13, 2019. Follow-up staging examination. EXAM: CT CHEST WITH CONTRAST TECHNIQUE: Multidetector CT imaging of the chest was performed during intravenous contrast administration. CONTRAST:  71mL OMNIPAQUE IOHEXOL 350 MG/ML SOLN COMPARISON:  CT of the chest 09/09/2020. FINDINGS: Cardiovascular: Heart size is enlarged with left atrial dilatation. Trace amount of pericardial fluid and/or thickening, unlikely to be of hemodynamic significance at this time, similar to the prior study. No pericardial calcification. There is aortic atherosclerosis, as well as atherosclerosis of the great vessels of the mediastinum  and the coronary arteries, including calcified atherosclerotic plaque in the left main, left anterior descending and left circumflex coronary arteries. Thickening calcification of the aortic valve and anterior leaflet of the mitral valve. Right internal jugular single-lumen porta cath with tip terminating in the superior aspect of the right atrium. Mediastinum/Nodes: No pathologically enlarged mediastinal or hilar lymph nodes. Esophagus is unremarkable in appearance. No axillary lymphadenopathy. Lungs/Pleura: Again noted is a large area of mass-like architectural distortion and volume loss with internal areas of traction bronchiectasis centered in the right lower lobe, with some involvement of the central aspect of the right middle lobe is well. Overall, this region appears minimally larger but is generally very similar to the prior study, and is associated with  surrounding spiculations, fissural thickening and widespread architectural distortion. Small chronic right pleural effusion, stable. No other definite suspicious appearing pulmonary nodules or masses are noted. No left pleural effusion. No acute consolidative airspace disease. Mild diffuse bronchial wall thickening with mild centrilobular and paraseptal emphysema. Upper Abdomen: Aortic atherosclerosis. Subcentimeter low-attenuation lesion in the posterior aspect of the interpolar region of the right kidney, too small to characterize, but statistically likely a small cyst. Musculoskeletal: Chronic compression fracture of superior endplate of T7 with up to 20% loss of central vertebral body height. New compression fracture of T11 with up to 20% loss of anterior vertebral body height. There are no aggressive appearing lytic or blastic lesions noted in the visualized portions of the skeleton. IMPRESSION: 1. Continued evolution of postradiation mass-like fibrosis in the right lower lobe. This area appears minimally larger when compared to the prior study from June, but there are no definitive imaging findings to clearly indicate locally recurrent disease. No mediastinal or hilar lymphadenopathy noted on today's examination. Small right pleural effusion is chronic and stable in size. Continued attention on follow-up studies is recommended to ensure stability. 2. Cardiomegaly with left atrial dilatation. 3. New but nonacute compression fracture of T11 with up to 20% loss of anterior vertebral body height. Old compression fracture of T7 is unchanged. 4. Aortic atherosclerosis, in addition to left main and 2 vessel coronary artery disease. 5. Diffuse bronchial wall thickening with mild centrilobular and paraseptal emphysema; imaging findings suggestive of underlying COPD. 6. There are calcifications of the aortic valve and mitral valve. Echocardiographic correlation for evaluation of potential valvular dysfunction may be  warranted if clinically indicated. Aortic Atherosclerosis (ICD10-I70.0) and Emphysema (ICD10-J43.9). Electronically Signed   By: Vinnie Langton M.D.   On: 01/09/2021 07:54    ASSESSMENT AND PLAN: This is a very pleasant 80 years old white male with a stage IIIb non-small cell lung cancer, squamous cell carcinoma diagnosed in October 2020 status post course of concurrent chemoradiation with 5 cycles of chemotherapy with carboplatin and paclitaxel. The patient tolerated his treatment well except for pancytopenia. The patient had partial response after his treatment. He is currently undergoing consolidation treatment with immunotherapy with Imfinzi 1500 mg IV every 4 weeks status post 5  cycles.   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 His treatment was discontinued because of the persistent and recurrent immunotherapy mediated pneumonitis. The patient started on a tapered dose of prednisone and over the last few weeks he felt much better with more energy and appetite as well as less cough and shortness of breath. The patient is doing fine today with no concerning complaints except for mild shortness of breath and itching. He had repeat CT  scan of the chest performed recently.  I personally and independently reviewed the scan images and discussed the results with the patient today. His scan showed no concerning findings for disease progression.  He has new compression fracture of T11.  I discussed the scan results with the patient and recommended for him to continue on observation with repeat CT scan of the chest in 6 months. For the compression fractures, he is currently asymptomatic but he was advised to seek help from orthopedic surgeon if he started having any complaints related to it. The patient was advised to call immediately if he has any other concerning symptoms in the interval. The patient voices  understanding of current disease status and treatment options and is in agreement with the current care plan.  All questions were answered. The patient knows to call the clinic with any problems, questions or concerns. We can certainly see the patient much sooner if necessary.  Disclaimer: This note was dictated with voice recognition software. Similar sounding words can inadvertently be transcribed and may not be corrected upon review.

## 2021-01-09 NOTE — Patient Instructions (Addendum)
Visit Information   Goals Addressed             This Visit's Progress    Track and Manage My Blood Pressure-Hypertension       Timeframe:  Long-Range Goal Priority:  High Start Date:  01/09/21                           Expected End Date:  07/10/21                     Follow Up Date 04/11/21    - check blood pressure weekly - choose a place to take my blood pressure (home, clinic or office, retail store) - write blood pressure results in a log or diary    Why is this important?   You won't feel high blood pressure, but it can still hurt your blood vessels.  High blood pressure can cause heart or kidney problems. It can also cause a stroke.  Making lifestyle changes like losing a little weight or eating less salt will help.  Checking your blood pressure at home and at different times of the day can help to control blood pressure.  If the doctor prescribes medicine remember to take it the way the doctor ordered.  Call the office if you cannot afford the medicine or if there are questions about it.     Notes:        Patient Care Plan: General Pharmacy (Adult)     Problem Identified: HTN, HLD, Afib   Priority: High  Onset Date: 01/09/2021     Long-Range Goal: Patient-Specific Goal   Start Date: 01/09/2021  Expected End Date: 07/10/2021  This Visit's Progress: On track  Priority: High  Note:   Current Barriers:  None at this time  Pharmacist Clinical Goal(s):  Patient will maintain control of BP as evidenced by monitoring  through collaboration with PharmD and provider.   Interventions: 1:1 collaboration with [name], MD regarding development and update of comprehensive plan of care as evidenced by provider attestation and co-signature Inter-disciplinary care team collaboration (see longitudinal plan of care) Comprehensive medication review performed; medication list updated in electronic medical record  Hypertension (BP goal  <130/80) -Controlled -Current treatment: Amlodipine 5mg  daily Atenolol 50mg  BID -Medications previously tried: none noted  -Current home readings: 123/85 today -Current exercise habits: gym 3x per week (45 min cardio, 15 weights) -Denies hypotensive/hypertensive symptoms -Educated on BP goals and benefits of medications for prevention of heart attack, stroke and kidney damage; Daily salt intake goal < 2300 mg; Exercise goal of 150 minutes per week; -Counseled to monitor BP at home weekly, document, and provide log at future appointments -Recommended to continue current medication Continue active lifestyle and medication adherence  Atrial Fibrillation (Goal: prevent stroke and major bleeding) -Controlled -Current treatment: Rate control: Atenolol 50mg  BID Anticoagulation: Xarelto 20mg  daily -Medications previously tried: Pradaxa -Home BP and HR readings: "controlled"  -Counseled on increased risk of stroke due to Afib and benefits of anticoagulation for stroke prevention; bleeding risk associated with Xarelto and importance of self-monitoring for signs/symptoms of bleeding; -Recommended to continue current medication Assessed patient finances. Patient copay is currently manageable. Discussed medication adherence  Patient Goals/Self-Care Activities Patient will:  - take medications as prescribed focus on medication adherence by pill counts check blood pressure weekly, document, and provide at future appointments  Follow Up Plan: The care management team will reach out to the patient again  over the next 180 days.        Patient verbalizes understanding of instructions provided today and agrees to view in [place].  Telephone follow up appointment with pharmacy team member scheduled for: 6 months  Edythe Clarity, [place]

## 2021-01-10 ENCOUNTER — Telehealth: Payer: Self-pay | Admitting: Internal Medicine

## 2021-01-10 NOTE — Telephone Encounter (Signed)
Left message with follow-up appointments per 10/13 los.

## 2021-01-27 DIAGNOSIS — I4811 Longstanding persistent atrial fibrillation: Secondary | ICD-10-CM | POA: Diagnosis not present

## 2021-01-27 DIAGNOSIS — I10 Essential (primary) hypertension: Secondary | ICD-10-CM | POA: Diagnosis not present

## 2021-02-09 ENCOUNTER — Other Ambulatory Visit: Payer: Self-pay | Admitting: Family Medicine

## 2021-03-13 ENCOUNTER — Encounter: Payer: Self-pay | Admitting: Family Medicine

## 2021-03-13 ENCOUNTER — Other Ambulatory Visit: Payer: Self-pay

## 2021-03-13 ENCOUNTER — Ambulatory Visit (INDEPENDENT_AMBULATORY_CARE_PROVIDER_SITE_OTHER): Payer: Medicare Other | Admitting: Family Medicine

## 2021-03-13 DIAGNOSIS — E785 Hyperlipidemia, unspecified: Secondary | ICD-10-CM

## 2021-03-13 DIAGNOSIS — I10 Essential (primary) hypertension: Secondary | ICD-10-CM

## 2021-03-13 DIAGNOSIS — C3491 Malignant neoplasm of unspecified part of right bronchus or lung: Secondary | ICD-10-CM

## 2021-03-13 DIAGNOSIS — I4811 Longstanding persistent atrial fibrillation: Secondary | ICD-10-CM | POA: Diagnosis not present

## 2021-03-13 DIAGNOSIS — F3342 Major depressive disorder, recurrent, in full remission: Secondary | ICD-10-CM | POA: Diagnosis not present

## 2021-03-13 MED ORDER — ATENOLOL 50 MG PO TABS: 50.0000 mg | ORAL_TABLET | Freq: Two times a day (BID) | ORAL | 3 refills | Status: DC

## 2021-03-13 MED ORDER — ROSUVASTATIN CALCIUM 10 MG PO TABS: 10.0000 mg | ORAL_TABLET | Freq: Every day | ORAL | 3 refills | Status: DC

## 2021-03-13 NOTE — Progress Notes (Signed)
Phone [phone] In person visit   Subjective:   Martin Bailey is a 80 y.o. year old very pleasant male patient who presents for/with See problem oriented charting Chief Complaint  Patient presents with   Follow-up    Hypertension. BP usually around 115/80   This visit occurred during the SARS-CoV-2 public health emergency.  Safety protocols were in place, including screening questions prior to the visit, additional usage of staff PPE, and extensive cleaning of exam room while observing appropriate contact time as indicated for disinfecting solutions.   Past Medical History-  Patient Active Problem List   Diagnosis Date Noted   Elevated coronary artery calcium score 05/02/2020    Priority: High   Stage III squamous cell carcinoma of right lung ([place]) 01/06/2019    Priority: High   Alcoholism ([place]) 06/21/2014    Priority: High   Atrial fibrillation ([place]) 03/04/2007    Priority: High   Elevated TSH 09/21/2014    Priority: Medium    Seborrheic dermatitis 07/19/2007    Priority: Medium    Hyperlipidemia 03/04/2007    Priority: Medium    Major depression ([place]) 03/04/2007    Priority: Medium    Essential hypertension 03/04/2007    Priority: Medium    Senile purpura ([place]) 09/05/2020    Priority: Low   Former smoker 06/21/2014    Priority: Low   Allergic rhinitis 04/06/2014    Priority: Low   Hx of adenomatous colonic polyps 03/05/2014    Priority: Low   Chronic anticoagulation 03/05/2014    Priority: Low   Testosterone deficiency 09/02/2010    Priority: Low   Abnormal findings on diagnostic imaging of lung 12/15/2019   Shortness of breath 12/15/2019   Drug-induced pneumonitis 09/19/2019   Encounter for antineoplastic immunotherapy 03/29/2019   Goals of care, counseling/discussion 01/06/2019   Cough 09/12/2018   Overweight 08/09/2017    Medications- reviewed and updated Current Outpatient Medications  Medication Sig Dispense Refill   amLODipine (NORVASC) 5  MG tablet TAKE 1 TABLET BY MOUTH  DAILY 90 tablet 3   budesonide-formoterol (SYMBICORT) 160-4.5 MCG/ACT inhaler Inhale into the lungs.     clobetasol (TEMOVATE) 0.05 % external solution Apply 1 application topically 2 (two) times daily. For seborrheic dermatitis- 7 days maximum (Patient taking differently: Apply 1 application topically 2 (two) times daily as needed (scalp itch).) 50 mL 0   desloratadine (CLARINEX) 5 MG tablet Take 5 mg by mouth daily.     famotidine (PEPCID) 20 MG tablet Take 20 mg by mouth 2 (two) times daily.     levocetirizine (XYZAL) 5 MG tablet Take 5 mg by mouth every evening.     Multiple Vitamin (MULTIVITAMIN WITH MINERALS) TABS tablet Take 1 tablet by mouth daily.     ondansetron (ZOFRAN ODT) 4 MG disintegrating tablet Take 1 tablet (4 mg total) by mouth every 8 (eight) hours as needed for nausea or vomiting. 20 tablet 1   triamcinolone cream (KENALOG) 0.5 % Apply topically 2 (two) times daily as needed. (Patient taking differently: Apply 1 application topically 2 (two) times daily as needed (rash).) 30 g 1   XARELTO 20 MG TABS tablet TAKE 1 TABLET BY MOUTH  DAILY WITH SUPPER 90 tablet 3   atenolol (TENORMIN) 50 MG tablet Take 1 tablet (50 mg total) by mouth 2 (two) times daily. 180 tablet 3   rosuvastatin (CRESTOR) 10 MG tablet Take 1 tablet (10 mg total) by mouth daily. 90 tablet 3   No current facility-administered medications  for this visit.     Objective:  BP 118/70 (BP Location: Left Arm, Patient Position: Sitting, Cuff Size: Normal)    Pulse 70    Temp 98.2 F (36.8 C) (Oral)    Wt 210 lb (95.3 kg)    SpO2 97%    BMI 30.13 kg/m  Gen: NAD, resting comfortably CV: RRR no murmurs rubs or gallops Lungs: CTAB no crackles, wheeze, rhonchi Abdomen: soft/nontender/nondistended/normal bowel sounds. No rebound or guarding.  Ext: no edema Skin: warm, dry    Assessment and Plan   # stage III squamous cell carcinoma of the lungs- follows with Dr. Earlie Server S:   diagnosed October 2020-status post course of concurrent chemoradiation with 5 cycles of chemotherapy. Did develop pancytopenia.  - with chemoradiation- Patient had partial response with initial treatment and underwent consolidation treatment with immunotherapy Imfinzi x 5 cycles but developed pneumonitis and diffuse itching and ultimately stopped - also dealing with pneumonitis possibly related to the imfinzi-patient on Symbicort - Dr. Julien Nordmann reviewed a chest CT patient had on 01/07/2021 which showed no concerning findings for disease- compression fracture on T11 was noted however. Recommended patient continue observation with repeat CT chest in another 6 months A/P: overall stable   # Skin irritation/pruritis- much improved with seeing allergist and being placed on 3 antihistamines- his only concern is when he eventually stops if symptoms will recur. Encouraged to schedule follow up. Mild irritation in neck but nothing like it was before  #Persistent atrial fibrillation #DOE- now improved with seeing pulm ant [facility] S: Rate controlled with atenolol 50 mg twice daily Anticoagulated with Xarelto 20 mg daily Patient is not followed by cardiology  - no palpitations or chest pain. No more SOB after working with [facility] pulmonology- with visit tomorrow-- consistent with inhaler A/P: appropriately anticoagulated and rate controlled- continue current meds.   #hypertension S: medication: atenolol 50mg  BID, amlodipine 5 mg- half tablet trial 09/05/20- he states has opted to do full tablet 3 days a week Home readings #s: similar home readings 110s over 70s  BP Readings from Last 3 Encounters:  03/13/21 118/70  01/09/21 115/81  10/21/20 98/60  A/P: reasonable control- typically I would not do every other day but seems to work for him- we will continue for now  #hyperlipidemia with Ct cardiac scoring 298 in 2021 S: Medication:Rosuvastatin 10 mg daily Lab Results  Component Value Date   CHOL 210 (H)  02/19/2020   HDL 67 02/19/2020   LDLCALC 125 (H) 02/19/2020   LDLDIRECT 62 09/09/2020   TRIG 101 02/19/2020   CHOLHDL 3.1 02/19/2020  A/P: hopefully stable- update full lipid panel today . Prefer LDL under 70 with CAD noted but no chest pain or shortness of breath  # Depression-his main desire is for companionship but does not believe this will happen S: Medication:None.  -did see psychiatry in the past but declines for now - Medications not beneficial in the past.  -Also has done counseling and did not find this particularly effective -Low testosterone previously treated and did not help with depression either --does enjoy bridge 4x a week online -No SI A/P: reasonably stable- continue current meds  #alcohol- 3-4 per day still. Not interested in quitting. Check LFts today  #prior nausea before nice dinner- has also noted occasionally in the morning as well. No emesis. Feels very slight cram pin lower abdomen- usually the zofran helps at least some- takes before nice dinners and that does help  Recommended follow up: Return in about  6 months (around 09/11/2021) for a phyical . Future Appointments  Date Time Provider [place]  07/08/2021  3:00 PM [facility] None  07/10/2021 10:45 AM Curt Bears, MD [facility] None   Lab/Order associations:   ICD-10-CM   1. Stage III squamous cell carcinoma of right lung (HCC)  C34.91     2. Essential hypertension  I10     3. Recurrent major depressive disorder, in full remission ([place])  F33.42     4. Hyperlipidemia, unspecified hyperlipidemia type  E78.5 CBC with Differential/Platelet    Comprehensive metabolic panel    Lipid panel    5. Longstanding persistent atrial fibrillation (HCC)  I48.11      Meds ordered this encounter  Medications   rosuvastatin (CRESTOR) 10 MG tablet    Sig: Take 1 tablet (10 mg total) by mouth daily.    Dispense:  90 tablet    Refill:  3   atenolol (TENORMIN) 50 MG tablet     Sig: Take 1 tablet (50 mg total) by mouth 2 (two) times daily.    Dispense:  180 tablet    Refill:  3    I,Harris Phan,acting as a scribe for Garret Reddish, MD.,have documented all relevant documentation on the behalf of Garret Reddish, MD,as directed by  Garret Reddish, MD while in the presence of Garret Reddish, MD.  I, Garret Reddish, MD, have reviewed all documentation for this visit. The documentation on 03/13/21 for the exam, diagnosis, procedures, and orders are all accurate and complete.   Return precautions advised.  Garret Reddish, MD

## 2021-03-13 NOTE — Patient Instructions (Addendum)
Health Maintenance Due  Topic Date Due   COVID-19 Vaccine (5 - Booster for Moderna series)  Team please log this 01/07/21 09/11/2020   Please stop by lab before you go If you have mychart- we will send your results within 3 business days of [place] receiving them.  If you do not have mychart- we will call you about results within 5 business days of [place] receiving them.  *please also note that you will see labs on mychart as soon as they post. I will later go in and write notes on them- will say "notes from [name]"  Please call and schedule a follow-up with [facility] - if you call now you should be able to get a  follow-up should be scheduled in [place] instead of [place].  No medication changes today.  Please monitor the spots on your wrist. If you notice any changes in growth or color, please let me know.  Recommended follow up: Return in about 6 months (around 09/11/2021) for a phyical .

## 2021-03-14 DIAGNOSIS — T50905A Adverse effect of unspecified drugs, medicaments and biological substances, initial encounter: Secondary | ICD-10-CM | POA: Diagnosis not present

## 2021-03-14 DIAGNOSIS — C3490 Malignant neoplasm of unspecified part of unspecified bronchus or lung: Secondary | ICD-10-CM | POA: Diagnosis not present

## 2021-03-14 DIAGNOSIS — Z87891 Personal history of nicotine dependence: Secondary | ICD-10-CM | POA: Diagnosis not present

## 2021-03-14 DIAGNOSIS — Z9221 Personal history of antineoplastic chemotherapy: Secondary | ICD-10-CM | POA: Diagnosis not present

## 2021-03-14 DIAGNOSIS — I10 Essential (primary) hypertension: Secondary | ICD-10-CM | POA: Diagnosis not present

## 2021-03-14 DIAGNOSIS — I482 Chronic atrial fibrillation, unspecified: Secondary | ICD-10-CM | POA: Diagnosis not present

## 2021-03-14 DIAGNOSIS — Z7901 Long term (current) use of anticoagulants: Secondary | ICD-10-CM | POA: Diagnosis not present

## 2021-03-14 DIAGNOSIS — J439 Emphysema, unspecified: Secondary | ICD-10-CM | POA: Diagnosis not present

## 2021-03-14 DIAGNOSIS — J849 Interstitial pulmonary disease, unspecified: Secondary | ICD-10-CM | POA: Diagnosis not present

## 2021-03-18 ENCOUNTER — Telehealth: Payer: Self-pay | Admitting: Pharmacist

## 2021-03-18 NOTE — Chronic Care Management (AMB) (Signed)
° ° °  Chronic Care Management Pharmacy Assistant   Name: Martin Bailey  MRN: [id number] DOB: [birth year]   Reason for Encounter: General Adherence Call    Recent office visits:  03/13/2021 OV (PCP) [name], MD; no medication changes indicated.  Recent consult visits:  03/14/2021 OV (Pulmonology) Bluford Kaufmann, MD; Please try decreasing your symbicort to 1 puff morning and 1 puff at night. If that goes well for 2 months, let me or your PCP know and either of [place] can reduce you to lower dose symbicort.   01/09/2021 OV (Oncology) Curt Bears, MD; no medication changes indicated.  Hospital visits:  None in previous 6 months  Medications: Outpatient Encounter Medications as of 03/18/2021  Medication Sig   amLODipine (NORVASC) 5 MG tablet TAKE 1 TABLET BY MOUTH  DAILY   atenolol (TENORMIN) 50 MG tablet Take 1 tablet (50 mg total) by mouth 2 (two) times daily.   budesonide-formoterol (SYMBICORT) 160-4.5 MCG/ACT inhaler Inhale into the lungs.   clobetasol (TEMOVATE) 0.05 % external solution Apply 1 application topically 2 (two) times daily. For seborrheic dermatitis- 7 days maximum (Patient taking differently: Apply 1 application topically 2 (two) times daily as needed (scalp itch).)   desloratadine (CLARINEX) 5 MG tablet Take 5 mg by mouth daily.   famotidine (PEPCID) 20 MG tablet Take 20 mg by mouth 2 (two) times daily.   levocetirizine (XYZAL) 5 MG tablet Take 5 mg by mouth every evening.   Multiple Vitamin (MULTIVITAMIN WITH MINERALS) TABS tablet Take 1 tablet by mouth daily.   ondansetron (ZOFRAN ODT) 4 MG disintegrating tablet Take 1 tablet (4 mg total) by mouth every 8 (eight) hours as needed for nausea or vomiting.   rosuvastatin (CRESTOR) 10 MG tablet Take 1 tablet (10 mg total) by mouth daily.   triamcinolone cream (KENALOG) 0.5 % Apply topically 2 (two) times daily as needed. (Patient taking differently: Apply 1 application topically 2 (two) times daily as  needed (rash).)   XARELTO 20 MG TABS tablet TAKE 1 TABLET BY MOUTH  DAILY WITH SUPPER   No facility-administered encounter medications on file as of 03/18/2021.   Patient Questions: Have you had any problems recently with your health? Patient states he recently had a visit with [name] and his pulmonologist. He states things are going well.  Have you had any problems with your pharmacy? Patient denies any issues or problems with his pharmacy.  What issues or side effects are you having with your medications? Patient denies having any issues with any of his medications.  What would you like me to pass along to Leata Mouse, CPP for him to help you with?  Patient does not have anything for me to pass along at this time.  What can we do to take care of you better? Patient did not have any suggestions.  Care Gaps: Medicare Annual Wellness: Due now - patient declines to schedule Hemoglobin A1C: 5.7% on 06/28/2014 Colonoscopy: Next due on 04/17/2021  Future Appointments  Date Time Provider [place]  07/08/2021  3:00 PM [facility] None  07/10/2021 10:45 AM Curt Bears, MD [facility] None  09/11/2021  9:40 AM [name], MD [facility]    Star Rating Drugs: Rosuvastatin last filled 11/28/2020 90 DS  April D Calhoun, [place] Pharmacist Assistant [phone]

## 2021-04-02 ENCOUNTER — Other Ambulatory Visit: Payer: Self-pay

## 2021-04-02 DIAGNOSIS — E785 Hyperlipidemia, unspecified: Secondary | ICD-10-CM

## 2021-04-04 ENCOUNTER — Other Ambulatory Visit (INDEPENDENT_AMBULATORY_CARE_PROVIDER_SITE_OTHER): Payer: Medicare Other

## 2021-04-04 ENCOUNTER — Other Ambulatory Visit: Payer: Self-pay

## 2021-04-04 DIAGNOSIS — E785 Hyperlipidemia, unspecified: Secondary | ICD-10-CM

## 2021-04-04 LAB — COMPREHENSIVE METABOLIC PANEL
ALT: 16 U/L (ref 0–53)
AST: 20 U/L (ref 0–37)
Albumin: 4.1 g/dL (ref 3.5–5.2)
Alkaline Phosphatase: 57 U/L (ref 39–117)
BUN: 13 mg/dL (ref 6–23)
CO2: 28 mEq/L (ref 19–32)
Calcium: 9.2 mg/dL (ref 8.4–10.5)
Chloride: 103 mEq/L (ref 96–112)
Creatinine, Ser: 1.07 mg/dL (ref 0.40–1.50)
GFR: 66.28 mL/min (ref >60.00)
Glucose, Bld: 110 mg/dL — ABNORMAL HIGH (ref 70–99)
Potassium: 5.2 mEq/L — ABNORMAL HIGH (ref 3.5–5.1)
Sodium: 136 mEq/L (ref 135–145)
Total Bilirubin: 0.6 mg/dL (ref 0.2–1.2)
Total Protein: 6.8 g/dL (ref 6.0–8.3)

## 2021-04-04 LAB — LIPID PANEL
Cholesterol: 137 mg/dL (ref 0–200)
HDL: 48.1 mg/dL (ref >39.00)
LDL Cholesterol: 67 mg/dL (ref 0–99)
NonHDL: 88.53
Total CHOL/HDL Ratio: 3
Triglycerides: 109 mg/dL (ref 0.0–149.0)
VLDL: 21.8 mg/dL (ref 0.0–40.0)

## 2021-04-04 LAB — CBC WITH DIFFERENTIAL/PLATELET
Basophils Absolute: 0.1 10*3/uL (ref 0.0–0.1)
Basophils Relative: 0.8 % (ref 0.0–3.0)
Eosinophils Absolute: 0.2 10*3/uL (ref 0.0–0.7)
Eosinophils Relative: 2.7 % (ref 0.0–5.0)
HCT: 41 % (ref 39.0–52.0)
Hemoglobin: 14 g/dL (ref 13.0–17.0)
Lymphocytes Relative: 17.4 % (ref 12.0–46.0)
Lymphs Abs: 1.3 10*3/uL (ref 0.7–4.0)
MCHC: 34.1 g/dL (ref 30.0–36.0)
MCV: 100.2 fl — ABNORMAL HIGH (ref 78.0–100.0)
Monocytes Absolute: 0.9 10*3/uL (ref 0.1–1.0)
Monocytes Relative: 12.1 % — ABNORMAL HIGH (ref 3.0–12.0)
Neutro Abs: 4.9 10*3/uL (ref 1.4–7.7)
Neutrophils Relative %: 67 % (ref 43.0–77.0)
Platelets: 165 10*3/uL (ref 150.0–400.0)
RBC: 4.09 Mil/uL — ABNORMAL LOW (ref 4.22–5.81)
RDW: 13.6 % (ref 11.5–15.5)
WBC: 7.2 10*3/uL (ref 4.0–10.5)

## 2021-05-20 ENCOUNTER — Other Ambulatory Visit: Payer: Self-pay | Admitting: Family Medicine

## 2021-05-27 ENCOUNTER — Other Ambulatory Visit: Payer: Self-pay | Admitting: Cardiology

## 2021-06-16 ENCOUNTER — Telehealth: Payer: Self-pay | Admitting: Family Medicine

## 2021-06-16 NOTE — Telephone Encounter (Signed)
Copied from [place] [phone]. Topic: Medicare AWV ?>> Jun 16, 2021 11:27 AM Harris-Coley, Hannah Beat wrote: ?Reason for CRM: Left message for patient to schedule Annual Wellness Visit.  Please schedule with Nurse Health Advisor Charlott Rakes, RN at [facility].  Please call [phone] ask for Juliann Pulse ?

## 2021-06-16 NOTE — Telephone Encounter (Signed)
Copied from [place] [phone]. Topic: Medicare AWV ?>> Jun 16, 2021  1:57 PM Harris-Coley, Hannah Beat wrote: ?Reason for CRM: Left message for patient to schedule Annual Wellness Visit.  Please schedule with Nurse Health Advisor Charlott Rakes, RN at [facility].  Please call [phone] ask for Juliann Pulse ?

## 2021-06-26 ENCOUNTER — Encounter: Payer: Self-pay | Admitting: Internal Medicine

## 2021-06-29 ENCOUNTER — Other Ambulatory Visit: Payer: Self-pay | Admitting: Cardiology

## 2021-07-08 ENCOUNTER — Ambulatory Visit (HOSPITAL_COMMUNITY)
Admission: RE | Admit: 2021-07-08 | Discharge: 2021-07-08 | Disposition: A | Discharge status: Home / Self Care | Payer: Medicare Other | Source: Ambulatory Visit | Attending: Internal Medicine | Admitting: Internal Medicine

## 2021-07-08 ENCOUNTER — Other Ambulatory Visit: Payer: Self-pay

## 2021-07-08 ENCOUNTER — Other Ambulatory Visit: Payer: Self-pay | Admitting: Cardiology

## 2021-07-08 ENCOUNTER — Inpatient Hospital Stay: Payer: Medicare Other | Attending: Internal Medicine

## 2021-07-08 DIAGNOSIS — I10 Essential (primary) hypertension: Secondary | ICD-10-CM | POA: Insufficient documentation

## 2021-07-08 DIAGNOSIS — C3490 Malignant neoplasm of unspecified part of unspecified bronchus or lung: Secondary | ICD-10-CM | POA: Diagnosis not present

## 2021-07-08 DIAGNOSIS — J90 Pleural effusion, not elsewhere classified: Secondary | ICD-10-CM | POA: Diagnosis not present

## 2021-07-08 DIAGNOSIS — D61818 Other pancytopenia: Secondary | ICD-10-CM | POA: Insufficient documentation

## 2021-07-08 DIAGNOSIS — C3431 Malignant neoplasm of lower lobe, right bronchus or lung: Secondary | ICD-10-CM | POA: Insufficient documentation

## 2021-07-08 LAB — CBC WITH DIFFERENTIAL (CANCER CENTER ONLY)
Abs Immature Granulocytes: 0.05 10*3/uL (ref 0.00–0.07)
Basophils Absolute: 0.1 10*3/uL (ref 0.0–0.1)
Basophils Relative: 1 %
Eosinophils Absolute: 0.3 10*3/uL (ref 0.0–0.5)
Eosinophils Relative: 4 %
HCT: 42 % (ref 39.0–52.0)
Hemoglobin: 14.5 g/dL (ref 13.0–17.0)
Immature Granulocytes: 1 %
Lymphocytes Relative: 23 %
Lymphs Abs: 1.7 10*3/uL (ref 0.7–4.0)
MCH: 33.8 pg (ref 26.0–34.0)
MCHC: 34.5 g/dL (ref 30.0–36.0)
MCV: 97.9 fL (ref 80.0–100.0)
Monocytes Absolute: 0.7 10*3/uL (ref 0.1–1.0)
Monocytes Relative: 9 %
Neutro Abs: 4.6 10*3/uL (ref 1.7–7.7)
Neutrophils Relative %: 62 %
Platelet Count: 137 10*3/uL — ABNORMAL LOW (ref 150–400)
RBC: 4.29 MIL/uL (ref 4.22–5.81)
RDW: 12.9 % (ref 11.5–15.5)
WBC Count: 7.3 10*3/uL (ref 4.0–10.5)
nRBC: 0 % (ref 0.0–0.2)

## 2021-07-08 LAB — CMP (CANCER CENTER ONLY)
ALT: 14 U/L (ref 0–44)
AST: 19 U/L (ref 15–41)
Albumin: 4.2 g/dL (ref 3.5–5.0)
Alkaline Phosphatase: 54 U/L (ref 38–126)
Anion gap: 6 (ref 5–15)
BUN: 13 mg/dL (ref 8–23)
CO2: 27 mmol/L (ref 22–32)
Calcium: 9.3 mg/dL (ref 8.9–10.3)
Chloride: 105 mmol/L (ref 98–111)
Creatinine: 1.09 mg/dL (ref 0.61–1.24)
GFR, Estimated: >60 mL/min (ref >60)
Glucose, Bld: 97 mg/dL (ref 70–99)
Potassium: 5.1 mmol/L (ref 3.5–5.1)
Sodium: 138 mmol/L (ref 135–145)
Total Bilirubin: 0.8 mg/dL (ref 0.3–1.2)
Total Protein: 7 g/dL (ref 6.5–8.1)

## 2021-07-08 MED ORDER — IOHEXOL 300 MG/ML  SOLN
75.0000 mL | Freq: Once | INTRAMUSCULAR | Status: AC | PRN
  Administered 2021-07-08: 75 mL via INTRAVENOUS

## 2021-07-08 MED ORDER — HEPARIN SOD (PORK) LOCK FLUSH 100 UNIT/ML IV SOLN
500.0000 [IU] | Freq: Once | INTRAVENOUS | Status: AC
  Administered 2021-07-08: 500 [IU] via INTRAVENOUS

## 2021-07-08 MED ORDER — HEPARIN SOD (PORK) LOCK FLUSH 100 UNIT/ML IV SOLN
INTRAVENOUS | Status: AC
  Filled 2021-07-08: qty 5

## 2021-07-10 ENCOUNTER — Other Ambulatory Visit: Payer: Self-pay

## 2021-07-10 ENCOUNTER — Inpatient Hospital Stay: Payer: Medicare Other | Admitting: Internal Medicine

## 2021-07-10 VITALS — BP 120/71 | HR 67 | Temp 97.2°F | Resp 18 | Ht 70.0 in | Wt 207.9 lb

## 2021-07-10 DIAGNOSIS — C3490 Malignant neoplasm of unspecified part of unspecified bronchus or lung: Secondary | ICD-10-CM | POA: Diagnosis not present

## 2021-07-10 DIAGNOSIS — I10 Essential (primary) hypertension: Secondary | ICD-10-CM | POA: Diagnosis not present

## 2021-07-10 DIAGNOSIS — D61818 Other pancytopenia: Secondary | ICD-10-CM | POA: Diagnosis not present

## 2021-07-10 DIAGNOSIS — C3431 Malignant neoplasm of lower lobe, right bronchus or lung: Secondary | ICD-10-CM | POA: Diagnosis not present

## 2021-07-10 NOTE — Progress Notes (Signed)
?    [place] ?Telephone:([phone]) [phone]   [phone] ? ?OFFICE PROGRESS NOTE ? ?[name], MD ?[place] [address] ? ?DIAGNOSIS: Stage IIIb (T3, N2, M0) non-small cell lung cancer, squamous cell carcinoma presented with right lower lobe lung mass in addition to subcarinal lymphadenopathy diagnosed in October 2020.  ?  ?PRIOR THERAPY:  ?1) Weekly concurrent chemoradiation with Carboplatin for an AUC of 2 Paclitaxel 45 mg/m2. First dose 01/16/2019. Status post 5 cycles.  Last dose was given February 13, 2019. ?2) Consolidation treatment with immunotherapy with Imfinzi 1500 mg IV every 4 weeks.  First dose April 05, 2019.  Status post 5 cycles.  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 ? ?CURRENT THERAPY:  Observation. ? ?INTERVAL HISTORY: ?Martin Bailey 81 y.o. male returns to the clinic today for 6 months follow-up visit.  The patient is feeling fine today with no concerning complaints.  He denied having any current chest pain, shortness of breath, cough or hemoptysis.  He denied having any nausea, vomiting, diarrhea or constipation.  He has no headache or visual changes.  The patient is here today for evaluation with repeat CT scan of the chest for restaging of his disease. ? ?MEDICAL HISTORY: ?Past Medical History:  ?Diagnosis Date  ? Cataract   ? left  ? COLONIC POLYPS, HX OF 04/05/2007  ? ONE ADENOMATOUS POLYP AND ONE HYPERPLASTIC POLYP  ? DEPRESSION 03/04/2007  ? DERMATITIS, SEBORRHEIC 07/19/2007  ? FIBRILLATION, ATRIAL 03/04/2007  ? Hearing aid worn   ? B/L  ? HYPERLIPIDEMIA 03/04/2007  ? HYPERTENSION 03/04/2007  ? Kidney stones   ? Lung cancer ([place]) dx'd 02/2019  ? non small cell lung ca  ? Lung mass   ? mediastinal adenopathy  ? ? ?ALLERGIES:  is allergic to hydrocodone. ? ?MEDICATIONS:  ?Current Outpatient Medications  ?Medication Sig Dispense Refill  ? amLODipine (NORVASC) 5 MG tablet TAKE 1 TABLET BY MOUTH   DAILY 90 tablet 3  ? atenolol (TENORMIN) 50 MG tablet TAKE 1 TABLET BY MOUTH  TWICE DAILY 180 tablet 3  ? budesonide-formoterol (SYMBICORT) 160-4.5 MCG/ACT inhaler Inhale into the lungs.    ? clobetasol (TEMOVATE) 0.05 % external solution Apply 1 application topically 2 (two) times daily. For seborrheic dermatitis- 7 days maximum (Patient taking differently: Apply 1 application topically 2 (two) times daily as needed (scalp itch).) 50 mL 0  ? desloratadine (CLARINEX) 5 MG tablet Take 5 mg by mouth daily.    ? famotidine (PEPCID) 20 MG tablet Take 20 mg by mouth 2 (two) times daily.    ? levocetirizine (XYZAL) 5 MG tablet Take 5 mg by mouth every evening.    ? Multiple Vitamin (MULTIVITAMIN WITH MINERALS) TABS tablet Take 1 tablet by mouth daily.    ? ondansetron (ZOFRAN ODT) 4 MG disintegrating tablet Take 1 tablet (4 mg total) by mouth every 8 (eight) hours as needed for nausea or vomiting. 20 tablet 1  ? rosuvastatin (CRESTOR) 10 MG tablet TAKE 1 TABLET BY MOUTH DAILY 30 tablet 1  ? triamcinolone cream (KENALOG) 0.5 % Apply topically 2 (two) times daily as needed. (Patient taking differently: Apply 1 application topically 2 (two) times daily as needed (rash).) 30 g 1  ? XARELTO 20 MG TABS tablet TAKE 1 TABLET BY MOUTH  DAILY WITH SUPPER 90 tablet 3  ? ?No current facility-administered medications for this visit.  ? ? ?SURGICAL HISTORY:  ?Past Surgical History:  ?Procedure Laterality  Date  ? BIOPSY  01/09/2020  ? Procedure: BIOPSY;  Surgeon: Laurin Coder, MD;  Location: [facility];  Service: Endoscopy;;  ? BRONCHIAL WASHINGS  01/09/2020  ? Procedure: BRONCHIAL WASHINGS;  Surgeon: Laurin Coder, MD;  Location: [facility];  Service: Endoscopy;;  ? CATARACT EXTRACTION W/ INTRAOCULAR LENS IMPLANT    ? right  ? COLONOSCOPY    ? IR IMAGING GUIDED PORT INSERTION  01/31/2019  ? RADIOFREQUENCY ABLATION    ? x2 for fib, on 2nd attempt switched form a flutter ot a fib.   ? ROTATOR CUFF REPAIR Right   ?  TONSILLECTOMY    ? VIDEO BRONCHOSCOPY N/A 01/09/2020  ? Procedure: VIDEO BRONCHOSCOPY WITH FLUORO;  Surgeon: Laurin Coder, MD;  Location: [facility];  Service: Endoscopy;  Laterality: N/A;  ? VIDEO BRONCHOSCOPY WITH ENDOBRONCHIAL ULTRASOUND Left 12/30/2018  ? Procedure: VIDEO BRONCHOSCOPY WITH ENDOBRONCHIAL ULTRASOUND WITH FLUOROSCOPY;  Surgeon: Laurin Coder, MD;  Location: [facility];  Service: Thoracic;  Laterality: Left;  ? WISDOM TOOTH EXTRACTION    ? ? ?REVIEW OF SYSTEMS:  A comprehensive review of systems was negative.  ? ?PHYSICAL EXAMINATION: General appearance: alert, cooperative, and no distress ?Head: Normocephalic, without obvious abnormality, atraumatic ?Neck: no adenopathy, no JVD, supple, symmetrical, trachea midline, and thyroid not enlarged, symmetric, no tenderness/mass/nodules ?Lymph nodes: Cervical, supraclavicular, and axillary nodes normal. ?Resp: clear to auscultation bilaterally ?Back: symmetric, no curvature. ROM normal. No CVA tenderness. ?Cardio: regular rate and rhythm, S1, S2 normal, no murmur, click, rub or gallop ?GI: soft, non-tender; bowel sounds normal; no masses,  no organomegaly ?Extremities: extremities normal, atraumatic, no cyanosis or edema ? ?ECOG PERFORMANCE STATUS: 1 - Symptomatic but completely ambulatory ? ?Blood pressure 120/71, pulse 67, temperature (!) 97.2 ?F (36.2 ?C), temperature source Tympanic, resp. rate 18, height 5\' 10"  (1.778 m), weight 207 lb 14.4 oz (94.3 kg), SpO2 100 %. ? ?LABORATORY DATA: ?Lab Results  ?Component Value Date  ? WBC 7.3 07/08/2021  ? HGB 14.5 07/08/2021  ? HCT 42.0 07/08/2021  ? MCV 97.9 07/08/2021  ? PLT 137 (L) 07/08/2021  ? ? ?  Chemistry   ?   ?Component Value Date/Time  ? NA 138 07/08/2021 1508  ? K 5.1 07/08/2021 1508  ? CL 105 07/08/2021 1508  ? CO2 27 07/08/2021 1508  ? BUN 13 07/08/2021 1508  ? CREATININE 1.09 07/08/2021 1508  ?    ?Component Value Date/Time  ? CALCIUM 9.3 07/08/2021 1508  ? ALKPHOS 54 07/08/2021 1508  ?  AST 19 07/08/2021 1508  ? ALT 14 07/08/2021 1508  ? BILITOT 0.8 07/08/2021 1508  ?  ? ? ? ?RADIOGRAPHIC STUDIES: ?CT Chest W Contrast ? ?Result Date: 07/09/2021 ?CLINICAL DATA:  Primary Cancer Type: Lung Imaging Indication: Routine surveillance Interval therapy since last imaging? No Initial Cancer Diagnosis Date: 01/07/2019; Established by: Biopsy-proven Detailed Pathology: Stage IIIb non-small cell lung cancer, squamous cell carcinoma. Primary Tumor location:  Right lower lobe. Surgeries: No thoracic. Chemotherapy: Yes; Ongoing? No; Most recent administration: 02/13/2019 Immunotherapy?  Yes; Type: Imfinzi; Ongoing? No Radiation therapy? Yes; Date Range: 01/01/2019-03/03/2019; Target: Right lower lobe. EXAM: CT CHEST WITH CONTRAST TECHNIQUE: Multidetector CT imaging of the chest was performed during intravenous contrast administration. RADIATION DOSE REDUCTION: This exam was performed according to the departmental dose-optimization program which includes automated exposure control, adjustment of the mA and/or kV according to patient size and/or use of iterative reconstruction technique. CONTRAST:  15mL OMNIPAQUE IOHEXOL 300 MG/ML  SOLN COMPARISON:  Most  recent CT chest 01/07/2021. 12/02/2018 PET-CT. FINDINGS: Cardiovascular: The heart is normal in size. Stable mild left atrial enlargement. No pericardial effusion. The aorta is normal in caliber. Scattered atherosclerotic calcifications. No dissection. Branch vessels are patent. Stable three-vessel coronary artery calcifications. Mediastinum/Nodes: Small scattered mediastinal and hilar lymph nodes. No mass or overt adenopathy. The esophagus is grossly. Lungs/Pleura: Stable post treatment/radiation changes involving the right lower lobe with a masslike area of consolidation with air bronchograms. Associated chronic right pleural effusion. No findings suspicious for recurrent tumor. Stable underlying emphysematous changes and pulmonary scarring. No new pulmonary  lesions. New 3.5 mm left lower lobe pulmonary nodule just above the left hemidiaphragm. Attention on future scans is suggested. Upper Abdomen: No significant upper abdominal findings. No hepatic or adrenal gland lesions

## 2021-07-13 IMAGING — CT NM PET TUM IMG INITIAL (PI) SKULL BASE T - THIGH
1 of 8 series · 3 of 25 positions shown · non-contrast
Comparison: CT chest 11/21/2018

CLINICAL DATA: Initial treatment strategy for right lower lobe lung
mass.

EXAM:
NUCLEAR MEDICINE PET SKULL BASE TO THIGH
TECHNIQUE: 10.8 mCi F-18 FDG was injected intravenously. Full-ring PET imaging
was performed from the skull base to thigh after the radiotracer. CT
data was obtained and used for attenuation correction and anatomic
localization.
Fasting blood glucose: 106 mg/dl

[Series 4: ct sk_thigh 5.0 b31f · axial · 5.0mm · 0.98mm/px · z∈[-1570,-582]mm · 3 of 248 slices shown]
[im 1/248  brain]
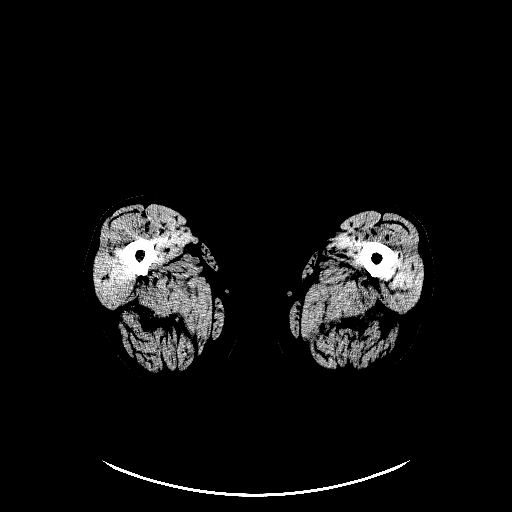
[im 62/248  brain]
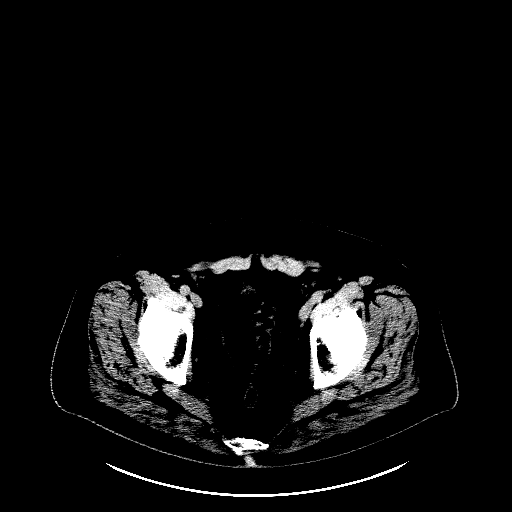
[im 248/248  brain]
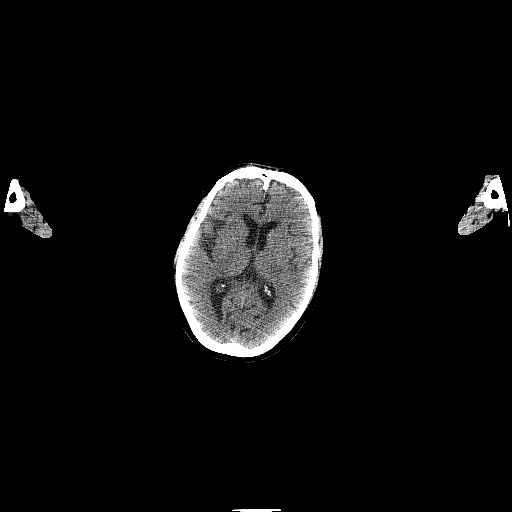

[3 of 25 positions shown; findings below may reference images not displayed]

FINDINGS: Mediastinal blood pool activity: SUV max

Liver activity: SUV max

NECK: No significant abnormal hypermetabolic activity in this
region.

Incidental CT findings: Right greater than left common carotid
atherosclerotic calcification.

CHEST: Indistinctly marginated 5.7 cm right lower lobe mass has
maximum SUV of 13.3. This has some associated postobstructive
atelectasis and pneumonitis.

A subcarinal lymph node measuring 1.6 cm in short axis on image 80/4
has maximum SUV of 4.3.

Incidental CT findings: Coronary, aortic arch, and branch vessel
atherosclerotic vascular disease. Mild cardiomegaly.

ABDOMEN/PELVIS: No significant abnormal hypermetabolic activity in
this region.

Incidental CT findings: Aortoiliac atherosclerotic vascular disease.

SKELETON: No significant abnormal hypermetabolic activity in this
region.

Incidental CT findings: Bridging spurring of both sacroiliac joints.
Mild dextroconvex lumbar scoliosis with rotary component. Grade 1
anterolisthesis of L5 on S1 believed to be associated with bilateral
chronic pars defects at L5.
IMPRESSION: 1. 5.7 cm right lower lobe mass has maximum SUV of 13.3, mild
associated postobstructive atelectasis and pneumonitis.
2. Subcarinal (level 7) lymph node measuring 1.6 cm in short axis
has low-grade metabolic activity with maximum SUV of 4.3, which is
mildly above background liver activity levels. This may well
represent early metastatic disease, but could also be reactive to
the postobstructive pneumonitis.
3. No distant metastatic disease outside of the chest is identified.
4. Other imaging findings of potential clinical significance: Aortic
Atherosclerosis ([id number]). Coronary atherosclerosis. Mild
cardiomegaly. Suspected bilateral pars defects at L5.

## 2021-07-22 ENCOUNTER — Telehealth: Payer: Medicare Other

## 2021-08-10 IMAGING — DX DG CHEST 1V PORT
1 series · 1 of 1 positions shown · non-contrast
Comparison: 11/16/2018

CLINICAL DATA: Post bronchoscopy

EXAM:
PORTABLE CHEST 1 VIEW

[chest ap]
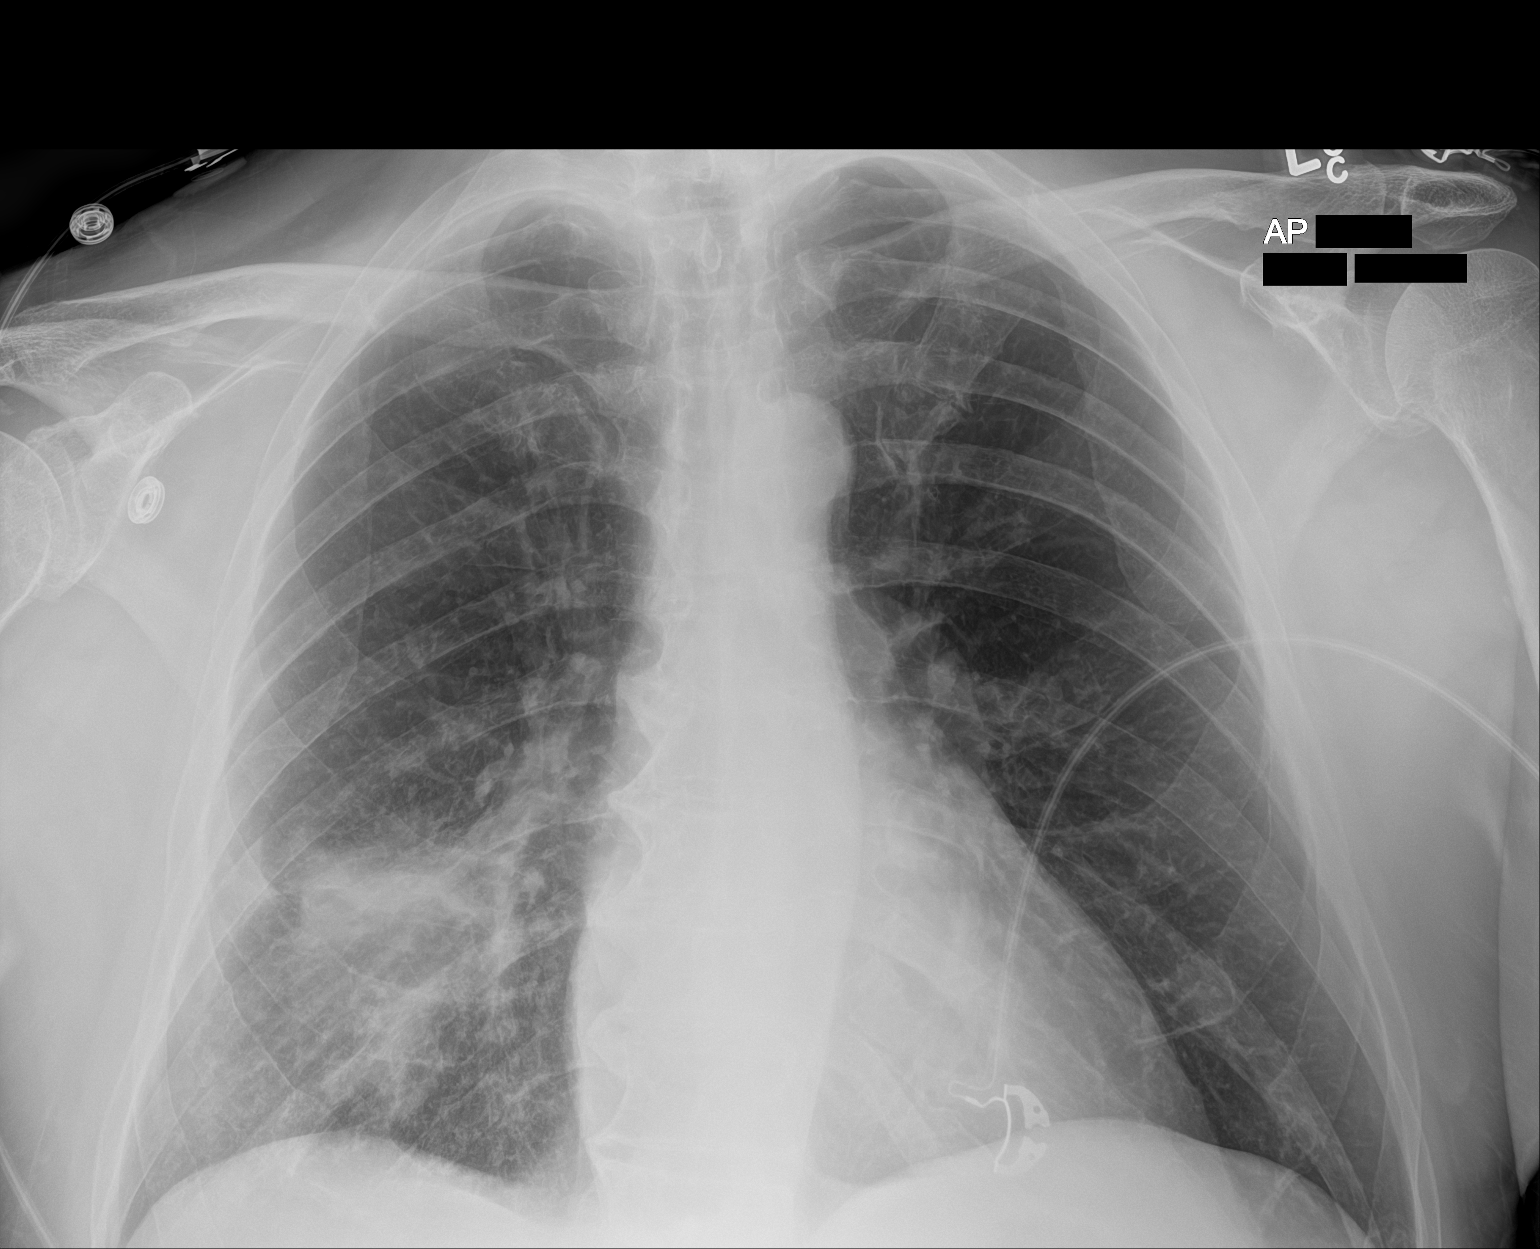

[1 of 1 positions shown; findings below may reference images not displayed]

FINDINGS: Right lower lobe opacity again noted. No pneumothorax following
bronchoscopy. Heart is normal size. No effusions.
IMPRESSION: No pneumothorax following bronchoscopy.

## 2021-09-11 ENCOUNTER — Ambulatory Visit (INDEPENDENT_AMBULATORY_CARE_PROVIDER_SITE_OTHER): Payer: Medicare Other | Admitting: Family Medicine

## 2021-09-11 ENCOUNTER — Encounter: Payer: Self-pay | Admitting: Family Medicine

## 2021-09-11 VITALS — BP 110/70 | HR 86 | Temp 97.3°F | Ht 70.0 in | Wt 207.2 lb

## 2021-09-11 DIAGNOSIS — F3342 Major depressive disorder, recurrent, in full remission: Secondary | ICD-10-CM | POA: Diagnosis not present

## 2021-09-11 DIAGNOSIS — I4811 Longstanding persistent atrial fibrillation: Secondary | ICD-10-CM | POA: Diagnosis not present

## 2021-09-11 DIAGNOSIS — Z0000 Encounter for general adult medical examination without abnormal findings: Secondary | ICD-10-CM

## 2021-09-11 DIAGNOSIS — R946 Abnormal results of thyroid function studies: Secondary | ICD-10-CM

## 2021-09-11 DIAGNOSIS — F1020 Alcohol dependence, uncomplicated: Secondary | ICD-10-CM

## 2021-09-11 DIAGNOSIS — Z23 Encounter for immunization: Secondary | ICD-10-CM | POA: Diagnosis not present

## 2021-09-11 DIAGNOSIS — S80212A Abrasion, left knee, initial encounter: Secondary | ICD-10-CM | POA: Diagnosis not present

## 2021-09-11 DIAGNOSIS — R7989 Other specified abnormal findings of blood chemistry: Secondary | ICD-10-CM

## 2021-09-11 DIAGNOSIS — I10 Essential (primary) hypertension: Secondary | ICD-10-CM | POA: Diagnosis not present

## 2021-09-11 DIAGNOSIS — C3491 Malignant neoplasm of unspecified part of right bronchus or lung: Secondary | ICD-10-CM | POA: Diagnosis not present

## 2021-09-11 DIAGNOSIS — E785 Hyperlipidemia, unspecified: Secondary | ICD-10-CM | POA: Diagnosis not present

## 2021-09-11 LAB — COMPREHENSIVE METABOLIC PANEL
ALT: 14 U/L (ref 0–53)
AST: 20 U/L (ref 0–37)
Albumin: 4.2 g/dL (ref 3.5–5.2)
Alkaline Phosphatase: 56 U/L (ref 39–117)
BUN: 13 mg/dL (ref 6–23)
CO2: 28 mEq/L (ref 19–32)
Calcium: 8.9 mg/dL (ref 8.4–10.5)
Chloride: 102 mEq/L (ref 96–112)
Creatinine, Ser: 1.05 mg/dL (ref 0.40–1.50)
GFR: 67.58 mL/min (ref >60.00)
Glucose, Bld: 100 mg/dL — ABNORMAL HIGH (ref 70–99)
Potassium: 5.1 mEq/L (ref 3.5–5.1)
Sodium: 138 mEq/L (ref 135–145)
Total Bilirubin: 0.7 mg/dL (ref 0.2–1.2)
Total Protein: 6.9 g/dL (ref 6.0–8.3)

## 2021-09-11 LAB — LIPID PANEL
Cholesterol: 181 mg/dL (ref 0–200)
HDL: 51.1 mg/dL (ref >39.00)
LDL Cholesterol: 107 mg/dL — ABNORMAL HIGH (ref 0–99)
NonHDL: 129.59
Total CHOL/HDL Ratio: 4
Triglycerides: 112 mg/dL (ref 0.0–149.0)
VLDL: 22.4 mg/dL (ref 0.0–40.0)

## 2021-09-11 LAB — CBC WITH DIFFERENTIAL/PLATELET
Basophils Absolute: 0.1 10*3/uL (ref 0.0–0.1)
Basophils Relative: 0.8 % (ref 0.0–3.0)
Eosinophils Absolute: 0.2 10*3/uL (ref 0.0–0.7)
Eosinophils Relative: 3 % (ref 0.0–5.0)
HCT: 42.7 % (ref 39.0–52.0)
Hemoglobin: 14.5 g/dL (ref 13.0–17.0)
Lymphocytes Relative: 14.1 % (ref 12.0–46.0)
Lymphs Abs: 1 10*3/uL (ref 0.7–4.0)
MCHC: 34 g/dL (ref 30.0–36.0)
MCV: 100.5 fl — ABNORMAL HIGH (ref 78.0–100.0)
Monocytes Absolute: 0.8 10*3/uL (ref 0.1–1.0)
Monocytes Relative: 10.3 % (ref 3.0–12.0)
Neutro Abs: 5.3 10*3/uL (ref 1.4–7.7)
Neutrophils Relative %: 71.8 % (ref 43.0–77.0)
Platelets: 180 10*3/uL (ref 150.0–400.0)
RBC: 4.25 Mil/uL (ref 4.22–5.81)
RDW: 13.8 % (ref 11.5–15.5)
WBC: 7.4 10*3/uL (ref 4.0–10.5)

## 2021-09-11 LAB — TSH: TSH: 3.6 u[IU]/mL (ref 0.35–5.50)

## 2021-09-11 MED ORDER — BUDESONIDE-FORMOTEROL FUMARATE 160-4.5 MCG/ACT IN AERO
2.0000 | INHALATION_SPRAY | Freq: Two times a day (BID) | RESPIRATORY_TRACT | 3 refills | Status: DC
Start: 2021-09-11 — End: 2022-09-15

## 2021-09-11 MED ORDER — ATORVASTATIN CALCIUM 10 MG PO TABS: 10.0000 mg | ORAL_TABLET | Freq: Every day | ORAL | 3 refills | Status: DC

## 2021-09-11 NOTE — Progress Notes (Signed)
Phone: [phone]   Subjective:  Patient presents today for their annual physical. Chief complaint-noted.   See problem oriented charting- ROS- full  review of systems was completed and negative  except for: nausea(can happen randomly- 3x with vomting total- once even even just with coffee and once before bed- antiacid will help if just a bit of nausea), vomiting, itching  The following were reviewed and entered/updated in epic: Past Medical History:  Diagnosis Date   Cataract    left   COLONIC POLYPS, HX OF 04/05/2007   ONE ADENOMATOUS POLYP AND ONE HYPERPLASTIC POLYP   DEPRESSION 03/04/2007   DERMATITIS, SEBORRHEIC 07/19/2007   FIBRILLATION, ATRIAL 03/04/2007   Hearing aid worn    B/L   HYPERLIPIDEMIA 03/04/2007   HYPERTENSION 03/04/2007   Kidney stones    Lung cancer ([place]) dx'd 02/2019   non small cell lung ca   Lung mass    mediastinal adenopathy   Patient Active Problem List   Diagnosis Date Noted   Elevated coronary artery calcium score 05/02/2020    Priority: High   Stage III squamous cell carcinoma of right lung ([place]) 01/06/2019    Priority: High   Alcoholism ([place]) 06/21/2014    Priority: High   Atrial fibrillation ([place]) 03/04/2007    Priority: High   Elevated TSH 09/21/2014    Priority: Medium    Seborrheic dermatitis 07/19/2007    Priority: Medium    Hyperlipidemia 03/04/2007    Priority: Medium    Major depression ([place]) 03/04/2007    Priority: Medium    Essential hypertension 03/04/2007    Priority: Medium    Former smoker 06/21/2014    Priority: Low   Allergic rhinitis 04/06/2014    Priority: Low   Hx of adenomatous colonic polyps 03/05/2014    Priority: Low   Chronic anticoagulation 03/05/2014    Priority: Low   Testosterone deficiency 09/02/2010    Priority: Low   Abnormal findings on diagnostic imaging of lung 12/15/2019   Shortness of breath 12/15/2019   Drug-induced pneumonitis 09/19/2019   Encounter for antineoplastic immunotherapy  03/29/2019   Goals of care, counseling/discussion 01/06/2019   Cough 09/12/2018   Overweight 08/09/2017   Past Surgical History:  Procedure Laterality Date   BIOPSY  01/09/2020   Procedure: BIOPSY;  Surgeon: Laurin Coder, MD;  Location: [facility];  Service: Endoscopy;;   BRONCHIAL WASHINGS  01/09/2020   Procedure: BRONCHIAL WASHINGS;  Surgeon: Laurin Coder, MD;  Location: [facility];  Service: Endoscopy;;   CATARACT EXTRACTION W/ INTRAOCULAR LENS IMPLANT     right   COLONOSCOPY     IR IMAGING GUIDED PORT INSERTION  01/31/2019   RADIOFREQUENCY ABLATION     x2 for fib, on 2nd attempt switched form a flutter ot a fib.    ROTATOR CUFF REPAIR Right    TONSILLECTOMY     VIDEO BRONCHOSCOPY N/A 01/09/2020   Procedure: VIDEO BRONCHOSCOPY WITH FLUORO;  Surgeon: Laurin Coder, MD;  Location: [facility];  Service: Endoscopy;  Laterality: N/A;   VIDEO BRONCHOSCOPY WITH ENDOBRONCHIAL ULTRASOUND Left 12/30/2018   Procedure: VIDEO BRONCHOSCOPY WITH ENDOBRONCHIAL ULTRASOUND WITH FLUOROSCOPY;  Surgeon: Laurin Coder, MD;  Location: [facility];  Service: Thoracic;  Laterality: Left;   WISDOM TOOTH EXTRACTION      Family History  Problem Relation Age of Onset   Osteoporosis Mother    Dementia Mother    Lung cancer Father        former smoker   Colon cancer Neg  Hx    Stomach cancer Neg Hx    Esophageal cancer Neg Hx    Rectal cancer Neg Hx     Medications- reviewed and updated Current Outpatient Medications  Medication Sig Dispense Refill   amLODipine (NORVASC) 5 MG tablet TAKE 1 TABLET BY MOUTH  DAILY 90 tablet 3   atenolol (TENORMIN) 50 MG tablet TAKE 1 TABLET BY MOUTH  TWICE DAILY 180 tablet 3   clobetasol (TEMOVATE) 0.05 % external solution Apply 1 application topically 2 (two) times daily. For seborrheic dermatitis- 7 days maximum (Patient taking differently: Apply 1 application  topically 2 (two) times daily as needed (scalp itch).) 50 mL 0   desloratadine  (CLARINEX) 5 MG tablet Take 5 mg by mouth daily.     famotidine (PEPCID) 20 MG tablet Take 20 mg by mouth 2 (two) times daily.     levocetirizine (XYZAL) 5 MG tablet Take 5 mg by mouth every evening.     Multiple Vitamin (MULTIVITAMIN WITH MINERALS) TABS tablet Take 1 tablet by mouth daily.     ondansetron (ZOFRAN ODT) 4 MG disintegrating tablet Take 1 tablet (4 mg total) by mouth every 8 (eight) hours as needed for nausea or vomiting. 20 tablet 1   triamcinolone cream (KENALOG) 0.5 % Apply topically 2 (two) times daily as needed. (Patient taking differently: Apply 1 application  topically 2 (two) times daily as needed (rash).) 30 g 1   XARELTO 20 MG TABS tablet TAKE 1 TABLET BY MOUTH  DAILY WITH SUPPER 90 tablet 3   budesonide-formoterol (SYMBICORT) 160-4.5 MCG/ACT inhaler Inhale into the lungs.     No current facility-administered medications for this visit.    Allergies-reviewed and updated Allergies  Allergen Reactions   Hydrocodone Other (See Comments)    Lost my appetite, loss of energy     Social History   Social History Narrative   Widower-wife died 22. 1 daughter. 2 grandkids. 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       Hobbies: golf, bridge, computer, sports, woodworking   Objective  Objective:  BP 110/70   Pulse 86   Temp (!) 97.3 F (36.3 C)   Ht 5\' 10"  (1.778 m)   Wt 207 lb 3.2 oz (94 kg)   SpO2 99%   BMI 29.73 kg/m  Gen: NAD, resting comfortably HEENT: Mucous membranes are moist. Oropharynx normal Neck: no thyromegaly CV: RRR no murmurs rubs or gallops Lungs: CTAB no crackles, wheeze, rhonchi Abdomen: soft/nontender/nondistended/normal bowel sounds. No rebound or guarding.  Ext: no edema, abrasion on left knee (recommended Td today-this occurred when he tripped and skinned the knee within the last week but appears to be healing well  though gradual) Skin: warm, dry Neuro: grossly normal, moves all extremities, PERRLA   Assessment and Plan  81 y.o. male presenting for annual physical.  Health Maintenance counseling: 1. Anticipatory guidance: Patient counseled regarding regular dental exams -q6 months, eye exams -yearly,  avoiding smoking and second hand smoke , limiting alcohol to 2 beverages per day - average 3-4 per day, no illicit drugs.   2. Risk factor reduction:  Advised patient of need for regular exercise and diet rich and fruits and vegetables to reduce risk of heart attack and stroke.  Exercise- gym 3 days a week.  Diet/weight management-reasonably stable- down a few lbs- he is working hard with healthy diet Wt  Readings from Last 3 Encounters:  09/11/21 207 lb 3.2 oz (94 kg)  07/10/21 207 lb 14.4 oz (94.3 kg)  03/13/21 210 lb (95.3 kg)  3. Immunizations/screenings/ancillary studies- scrape on left knee so needs updated Td today Immunization History  Administered Date(s) Administered   Fluad Quad(high Dose 65+) 01/16/2019   Influenza Nasal 01/07/2020   Influenza Split 12/22/2011   Influenza Whole 01/18/2008, 01/04/2009   Influenza, High Dose Seasonal PF 01/02/2016, 01/21/2017, 02/10/2018   Influenza,inj,Quad PF,6+ Mos 12/23/2012, 02/20/2014, 12/21/2014   Influenza-Unspecified 01/07/2021   Moderna Sars-Covid-2 Vaccination 04/29/2019, 05/27/2019, 01/31/2020, 07/17/2020, 01/07/2021   PFIZER(Purple Top)SARS-COV-2 Vaccination 08/02/2021   Pneumococcal Conjugate-13 06/21/2014   Pneumococcal Polysaccharide-23 01/18/2008   Td 11/21/2001   Tdap 12/22/2011   Zoster Recombinat (Shingrix) 02/03/2018, 04/07/2018   Zoster, Live 01/18/2008  4. Prostate cancer screening- past age based screening recommendations- denies urinary changes  Lab Results  Component Value Date   PSA 0.44 11/25/2011   PSA 0.38 04/11/2009   PSA 0.38 07/19/2008   5. Colon cancer screening - 04/17/20 with [facility]- we are not sure when or IF they  want to do repeat- we are having our team call - we were able to find letter stating no further colonoscopy per Dr. Ivor Messier.  6. Skin cancer screening- a few years- may call to schedule. advised regular sunscreen use. Denies worrisome, changing, or new skin lesions.  7. Smoking associated screening (lung cancer screening, AAA screen 65-75, UA)- former smoker- quit 1979 8. STD screening - not dating  Status of chronic or acute concerns   # stage III squamous cell carcinoma of the lungs S:  diagnosed October 2020-status post course of concurrent chemoradiation with 5 cycles of chemotherapy.  Did develop pancytopenia.  - with chemoradiation- Patient had partial response with initial treatment and underwent consolidation treatment with immunotherapy Imfinzi  x 5 cycles but developed pneumonitis and diffuse itching and ultimately stopped - also dealing with pneumonitis possibly related to the imfinzi-patient on Symbicort A/P: Most recent visit in April-has 1 area in his lung 0.5 cm being monitored and has 31-month repeat imaging and follow-up with oncology  #Persistent atrial fibrillation S: Rate controlled with atenolol 50mg  Anticoagulated with Xarelto 20mg  Patient is not followed by cardiology regularly- saw short term with CT cardiac scoring A/P: Appropriately anticoagulated and rate controlled-continue current medication  #hypertension S: medication:  atenolol 50mg , amlodipine 5 mg- half tablet  as needed- has not needed lately  BP Readings from Last 3 Encounters:  09/11/21 110/70  07/10/21 120/71  03/13/21 118/70   A/P:   Controlled. Continue current medications.    #CAD based on ct cardiac scoring 298 in 2021 #hyperlipidemia S: Medication:Rosuvastatin 10 mg daily, xarelto 20 mg -No chest pain reported.  No SOB  Lab Results  Component Value Date   CHOL 137 04/04/2021   HDL 48.10 04/04/2021   LDLCALC 67 04/04/2021   LDLDIRECT 62 09/09/2020   TRIG 109.0 04/04/2021   CHOLHDL 3  04/04/2021  A/P: Recently had full lipid panel in excellent control.  CAD appears largely asymptomatic-continue current medications - would not do aspirin as on xarelto   # Depression-his main desire is for companionship but does not believe this will happen S: Medication:None.  -did see psychiatry in the past and may return to see them  - Medications not beneficial in the past.   -Also has done counseling and did not find this particularly effective -Low testosterone previously treated and did not help with depression either  --  does enjoy bridge 4x a week    09/11/2021    9:37 AM 10/21/2020   11:21 AM 09/05/2020    8:01 AM  Depression screen PHQ 2/9  Decreased Interest 2 0 0  Down, Depressed, Hopeless 2 0 0  PHQ - 2 Score 4 0 0  Altered sleeping 0  0  Tired, decreased energy 2  0  Change in appetite 0  0  Feeling bad or failure about yourself  1  0  Trouble concentrating 0  0  Moving slowly or fidgety/restless 0  0  Suicidal thoughts 0  0  PHQ-9 Score 7  0  Difficult doing work/chores Not difficult at all Not difficult at all Not difficult at all  A/P: slightly worsened- he is open to psychiatry consult- referral placed today - if any thoughts of self harm encouraged to seek care/call [phone] or [phone] immediately  #Alcoholism-currently drinking 3-4 beverages per day but he is not interested in changing despite prior counseling- he has asked me not to proess on this  #Mildly elevated TSH-looks good on last check and has been followed by oncology in the past-we will check today Lab Results  Component Value Date   TSH 1.959 01/08/2020   #Skin irritation/pruritus-at last visit reported improvement with seeing allergist and being placed on 3 antihistamines but he was concerned about recurrence when stopping- he later stopped as worsened even on meds- could return to allergiest   #ongoing nausea issues with intermittent vomiting - first mentioned June 2022. Started statin in February 2022.  Before going through with endoscopy or GI referral for their opinion- we opted to trial off rosuvastatin for 6 weeks- but In retrospect he has bene out for about 6 weeks and has noted - no severe episodes and less frequent nausea.  - we are going to change to atorvastatin and reduce strength- if frequency increases or has severe episodes when restarting he will let me know  Recommended follow up: No follow-ups on file. Future Appointments  Date Time Provider [place]  01/08/2022 11:00 AM [facility] None  01/12/2022 11:00 AM Curt Bears, MD [facility] None   Lab/Order associations:NOT fasting   ICD-10-CM   1. Preventative health care  Z00.00     2. Stage III squamous cell carcinoma of right lung (HCC)  C34.91     3. Longstanding persistent atrial fibrillation (HCC)  I48.11     4. Recurrent major depressive disorder, in full remission ([place])  F33.42     5. Essential hypertension  I10     6. Hyperlipidemia, unspecified hyperlipidemia type  E78.5     7. Elevated TSH  R79.89     8. Alcoholism ([place])  F10.20     9. Abrasion of left knee, initial encounter  S80.212A       No orders of the defined types were placed in this encounter.   Return precautions advised.  Garret Reddish, MD

## 2021-09-11 NOTE — Patient Instructions (Addendum)
Td today under abrasion left knee  - we are going to change to atorvastatin and reduce strength- if frequency increases or has severe episodes when restarting he will let me know  [place] [facility] will call you within two weeks about your referral to behavioral health/psychiatry. If you do not hear within 2 weeks, give [place] [facility]  - if any thoughts of self harm encouraged to seek care/call [phone] or [phone] immediately  EMDR therapy is an option as well  Please stop by lab before you go If you have mychart- we will send your results within 3 business days of [place] receiving them.  If you do not have mychart- we will call you about results within 5 business days of [place] receiving them.  *please also note that you will see labs on mychart as soon as they post. I will later go in and write notes on them- will say "notes from [name]"   Recommended follow up: Return in about 6 months (around 03/13/2022) for followup or sooner if needed.Schedule b4 you leave.

## 2021-09-23 ENCOUNTER — Telehealth: Payer: Self-pay | Admitting: Pharmacist

## 2021-09-23 NOTE — Progress Notes (Signed)
Chronic Care Management Pharmacy Assistant   Name: Martin Bailey  MRN: [id number] DOB: 1940/08/01   Reason for Encounter: Hypertension Adherence Call    Recent office visits:  09/11/2021 [facility] (PCP) [name], MD; no medication changes indicated.  Recent consult visits:  07/10/2021 [facility] (Oncology) Curt Bears, MD; no medication changes indicated.  Hospital visits:  None in previous 6 months  Medications: Outpatient Encounter Medications as of 09/23/2021  Medication Sig   amLODipine (NORVASC) 5 MG tablet TAKE 1 TABLET BY MOUTH  DAILY   atenolol (TENORMIN) 50 MG tablet TAKE 1 TABLET BY MOUTH  TWICE DAILY   atorvastatin (LIPITOR) 10 MG tablet Take 1 tablet (10 mg total) by mouth daily.   budesonide-formoterol (SYMBICORT) 160-4.5 MCG/ACT inhaler Inhale 2 puffs into the lungs in the morning and at bedtime.   clobetasol (TEMOVATE) 0.05 % external solution Apply 1 application topically 2 (two) times daily. For seborrheic dermatitis- 7 days maximum (Patient taking differently: Apply 1 application  topically 2 (two) times daily as needed (scalp itch).)   desloratadine (CLARINEX) 5 MG tablet Take 5 mg by mouth daily.   famotidine (PEPCID) 20 MG tablet Take 20 mg by mouth 2 (two) times daily.   levocetirizine (XYZAL) 5 MG tablet Take 5 mg by mouth every evening.   Multiple Vitamin (MULTIVITAMIN WITH MINERALS) TABS tablet Take 1 tablet by mouth daily.   ondansetron (ZOFRAN ODT) 4 MG disintegrating tablet Take 1 tablet (4 mg total) by mouth every 8 (eight) hours as needed for nausea or vomiting.   triamcinolone cream (KENALOG) 0.5 % Apply topically 2 (two) times daily as needed. (Patient taking differently: Apply 1 application  topically 2 (two) times daily as needed (rash).)   XARELTO 20 MG TABS tablet TAKE 1 TABLET BY MOUTH  DAILY WITH SUPPER   No facility-administered encounter medications on file as of 09/23/2021.   Reviewed chart prior to disease state call. Spoke with  patient regarding BP  Recent Office Vitals: BP Readings from Last 3 Encounters:  09/11/21 110/70  07/10/21 120/71  03/13/21 118/70   Pulse Readings from Last 3 Encounters:  09/11/21 86  07/10/21 67  03/13/21 70    Wt Readings from Last 3 Encounters:  09/11/21 207 lb 3.2 oz (94 kg)  07/10/21 207 lb 14.4 oz (94.3 kg)  03/13/21 210 lb (95.3 kg)     Kidney Function Lab Results  Component Value Date/Time   CREATININE 1.05 09/11/2021 11:08 AM   CREATININE 1.09 07/08/2021 03:08 PM   CREATININE 1.07 04/04/2021 08:33 AM   CREATININE 1.08 01/07/2021 03:48 PM   GFR 67.58 09/11/2021 11:08 AM   GFRNONAA >60 07/08/2021 03:08 PM   GFRAA >60 12/20/2019 10:28 AM   GFRAA >60 10/05/2019 10:27 AM       Latest Ref Rng & Units 09/11/2021   11:08 AM 07/08/2021    3:08 PM 04/04/2021    8:33 AM  BMP  Glucose 70 - 99 mg/dL 100  97  110   BUN 6 - 23 mg/dL 13  13  13    Creatinine 0.40 - 1.50 mg/dL 1.05  1.09  1.07   Sodium 135 - 145 mEq/L 138  138  136   Potassium 3.5 - 5.1 mEq/L 5.1  5.1  5.2 No hemolysis seen   Chloride 96 - 112 mEq/L 102  105  103   CO2 19 - 32 mEq/L 28  27  28    Calcium 8.4 - 10.5 mg/dL 8.9  9.3  9.2     Current antihypertensive regimen:  Amlodipine 5 mg daily Atenolol 50 mg twice daily  How often are you checking your Blood Pressure? weekly  Current home BP readings: 122/78  What recent interventions/DTPs have been made by any provider to improve Blood Pressure control since last CPP Visit: No recent interventions or DTPs.  Any recent hospitalizations or ED visits since last visit with CPP? No  What diet changes have been made to improve Blood Pressure Control?  Patient states he eats "regular foods".  What exercise is being done to improve your Blood Pressure Control?  Patient states he exercises at the gym 3 times a week.  Adherence Review: Is the patient currently on ACE/ARB medication? No Does the patient have >5 day gap between last estimated fill dates?  No   Care Gaps: Medicare Annual Wellness: Due now - declines Hemoglobin A1C: 5.5% on 06/28/2014 Colonoscopy: Completed 04/17/2020  Future Appointments  Date Time Provider [place]  01/08/2022 11:00 AM [facility] None  01/12/2022 11:00 AM Curt Bears, MD [facility] None  03/10/2022  9:20 AM [name], MD [facility]   Star Rating Drugs: Atorvastatin 10 mg last filled 09/11/2021  April D Calhoun, [place] Pharmacist Assistant [phone]

## 2021-10-06 ENCOUNTER — Encounter: Payer: Self-pay | Admitting: Internal Medicine

## 2021-10-20 ENCOUNTER — Other Ambulatory Visit: Payer: Self-pay

## 2021-10-24 ENCOUNTER — Ambulatory Visit: Payer: Medicare Other | Admitting: Psychiatry

## 2021-10-24 ENCOUNTER — Encounter: Payer: Self-pay | Admitting: Internal Medicine

## 2021-10-24 DIAGNOSIS — R69 Illness, unspecified: Secondary | ICD-10-CM

## 2021-10-24 NOTE — Progress Notes (Signed)
[facility] MD/PA/NP Initial Note  10/24/2021 1:09 PM Martin Bailey  MRN:  [id number]  Met with patient and he reports that he was requesting to see a therapist and does not wish to start medications since he has taken medication in the past with limited benefit. Discussed that this provider's role is to provide psychiatric medication management. Agreed not to proceed with evaluation for medication management and will instead attempt to reschedule with a therapy provider.

## 2021-11-11 DIAGNOSIS — H353132 Nonexudative age-related macular degeneration, bilateral, intermediate dry stage: Secondary | ICD-10-CM | POA: Diagnosis not present

## 2021-11-11 DIAGNOSIS — H43813 Vitreous degeneration, bilateral: Secondary | ICD-10-CM | POA: Diagnosis not present

## 2021-11-11 DIAGNOSIS — Z961 Presence of intraocular lens: Secondary | ICD-10-CM | POA: Diagnosis not present

## 2021-11-12 ENCOUNTER — Telehealth: Payer: Self-pay | Admitting: Pharmacist

## 2021-11-12 NOTE — Progress Notes (Signed)
Chronic Care Management Pharmacy Assistant   Name: Martin Bailey  MRN: [id number] DOB: [birth year]   Reason for Encounter: Hypertension Adherence Call    Recent office visits:  None  Recent consult visits:  None  Hospital visits:  None in previous 6 months  Medications: Outpatient Encounter Medications as of 11/12/2021  Medication Sig   amLODipine (NORVASC) 5 MG tablet TAKE 1 TABLET BY MOUTH  DAILY   atenolol (TENORMIN) 50 MG tablet TAKE 1 TABLET BY MOUTH  TWICE DAILY   atorvastatin (LIPITOR) 10 MG tablet Take 1 tablet (10 mg total) by mouth daily.   budesonide-formoterol (SYMBICORT) 160-4.5 MCG/ACT inhaler Inhale 2 puffs into the lungs in the morning and at bedtime.   clobetasol (TEMOVATE) 0.05 % external solution Apply 1 application topically 2 (two) times daily. For seborrheic dermatitis- 7 days maximum (Patient taking differently: Apply 1 application  topically 2 (two) times daily as needed (scalp itch).)   desloratadine (CLARINEX) 5 MG tablet Take 5 mg by mouth daily.   famotidine (PEPCID) 20 MG tablet Take 20 mg by mouth 2 (two) times daily.   levocetirizine (XYZAL) 5 MG tablet Take 5 mg by mouth every evening.   Multiple Vitamin (MULTIVITAMIN WITH MINERALS) TABS tablet Take 1 tablet by mouth daily.   ondansetron (ZOFRAN ODT) 4 MG disintegrating tablet Take 1 tablet (4 mg total) by mouth every 8 (eight) hours as needed for nausea or vomiting.   triamcinolone cream (KENALOG) 0.5 % Apply topically 2 (two) times daily as needed. (Patient taking differently: Apply 1 application  topically 2 (two) times daily as needed (rash).)   XARELTO 20 MG TABS tablet TAKE 1 TABLET BY MOUTH  DAILY WITH SUPPER   No facility-administered encounter medications on file as of 11/12/2021.   Reviewed chart prior to disease state call. Spoke with patient regarding BP  Recent Office Vitals: BP Readings from Last 3 Encounters:  09/11/21 110/70  07/10/21 120/71  03/13/21 118/70   Pulse  Readings from Last 3 Encounters:  09/11/21 86  07/10/21 67  03/13/21 70    Wt Readings from Last 3 Encounters:  09/11/21 207 lb 3.2 oz (94 kg)  07/10/21 207 lb 14.4 oz (94.3 kg)  03/13/21 210 lb (95.3 kg)     Kidney Function Lab Results  Component Value Date/Time   CREATININE 1.05 09/11/2021 11:08 AM   CREATININE 1.09 07/08/2021 03:08 PM   CREATININE 1.07 04/04/2021 08:33 AM   CREATININE 1.08 01/07/2021 03:48 PM   GFR 67.58 09/11/2021 11:08 AM   GFRNONAA >60 07/08/2021 03:08 PM   GFRAA >60 12/20/2019 10:28 AM   GFRAA >60 10/05/2019 10:27 AM       Latest Ref Rng & Units 09/11/2021   11:08 AM 07/08/2021    3:08 PM 04/04/2021    8:33 AM  BMP  Glucose 70 - 99 mg/dL 100  97  110   BUN 6 - 23 mg/dL 13  13  13    Creatinine 0.40 - 1.50 mg/dL 1.05  1.09  1.07   Sodium 135 - 145 mEq/L 138  138  136   Potassium 3.5 - 5.1 mEq/L 5.1  5.1  5.2 No hemolysis seen   Chloride 96 - 112 mEq/L 102  105  103   CO2 19 - 32 mEq/L 28  27  28    Calcium 8.4 - 10.5 mg/dL 8.9  9.3  9.2     Current antihypertensive regimen:  Amlodipine 5 mg daily Atenolol 50 mg twice daily  How often are you checking your Blood Pressure? weekly  Current home BP readings: 110/78  What recent interventions/DTPs have been made by any provider to improve Blood Pressure control since last CPP Visit: No recent interventions or DTPs.  Any recent hospitalizations or ED visits since last visit with CPP? No  What diet changes have been made to improve Blood Pressure Control?  Patient states he eats a good variety of foods.  What exercise is being done to improve your Blood Pressure Control?  Patient goes to the gym 3 days a week for an hr.  Adherence Review: Is the patient currently on ACE/ARB medication? No Does the patient have >5 day gap between last estimated fill dates? No   Care Gaps: Medicare Annual Wellness: Due now - declines Hemoglobin A1C: 5.5% on 06/28/2014 Colonoscopy: Completed 04/17/2020  Future  Appointments  Date Time Provider [place]  01/08/2022 11:00 AM [facility] None  01/12/2022 11:00 AM Curt Bears, MD [facility] None  01/13/2022 11:00 AM Blanchie Serve, PhD [facility] None  03/10/2022  9:20 AM [name], MD [facility]   Star Rating Drugs: Atorvastatin 10 mg last filled 09/11/2021 100 DS  April D Calhoun, [place] Pharmacist Assistant [phone]

## 2021-11-21 ENCOUNTER — Other Ambulatory Visit: Payer: Self-pay | Admitting: *Deleted

## 2021-11-21 NOTE — Patient Outreach (Signed)
  Care Coordination   11/21/2021 Name: Martin Bailey MRN: [id number] DOB: April 23, 1940   Care Coordination Outreach Attempts:  An unsuccessful telephone outreach was attempted today to offer the patient information about available care coordination services as a benefit of their health plan.   Follow Up Plan:  Additional outreach attempts will be made to offer the patient care coordination information and services.   Encounter Outcome:  No Answer  Care Coordination Interventions Activated:  No   Care Coordination Interventions:  No, not indicated    Raina Mina, RN Care Management Coordinator [place] Office [phone]

## 2021-12-12 ENCOUNTER — Ambulatory Visit: Payer: Self-pay

## 2021-12-12 NOTE — Patient Outreach (Signed)
  Care Coordination   12/12/2021 Name: Consuelo Thayne MRN: [id number] DOB: 1940-07-10   Care Coordination Outreach Attempts:  A second unsuccessful outreach was attempted today to offer the patient with information about available care coordination services as a benefit of their health plan.     Follow Up Plan:  Additional outreach attempts will be made to offer the patient care coordination information and services.   Encounter Outcome:  No Answer  Care Coordination Interventions Activated:  No   Care Coordination Interventions:  No, not indicated    Daneen Schick, BSW, CDP Social Worker, Certified Dementia Practitioner [facility] Care Management  Care Coordination [phone]

## 2021-12-22 ENCOUNTER — Telehealth: Payer: Self-pay

## 2021-12-22 ENCOUNTER — Encounter: Payer: Self-pay | Admitting: *Deleted

## 2021-12-22 NOTE — Patient Instructions (Signed)
Visit Information  Thank you for taking time to visit with me today. Please don't hesitate to contact me if I can be of assistance to you.   Following are the goals we discussed today:   Goals Addressed             This Visit's Progress    COMPLETED: Care Coordination Activities - no follow up required       Care Coordination Interventions: Advised patient to consider having Annual Wellness Visit Provided education to patient re: Annual Wellness Visit, care coordination services Assessed social determinant of health barriers          If you are experiencing a Mental Health or [place] or need someone to talk to, please call the Suicide and Crisis Lifeline: 988 call the [place] National Suicide Prevention Lifeline: (332)510-0054 or TTY: 252-279-4141 TTY 224-714-9434) to talk to a trained counselor call 1-800-273-TALK (toll free, 24 hour hotline) go to [facility] [address] [place] [phone]) call 911   Patient verbalizes understanding of instructions and care plan provided today and agrees to view in [place]. Active MyChart status and patient understanding of how to access instructions and care plan via MyChart confirmed with patient.     No further follow up required:    Peter Garter RN, Jackquline Denmark, [place] Management [phone]

## 2021-12-22 NOTE — Patient Outreach (Signed)
  Care Coordination   Initial Visit Note   12/22/2021 Name: Martin Bailey MRN: [id number] DOB: [birth year]  Martin Bailey is a 81 y.o. year old male who sees Martin Bailey, Martin Mars, MD for primary care. I spoke with  Martin Bailey by phone today.  What matters to the patients health and wellness today?  No concerns today. States he is getting his flu and COVID shot tomorrow    Goals Addressed             This Visit's Progress    COMPLETED: Care Coordination Activities - no follow up required       Care Coordination Interventions: Advised patient to consider having Annual Wellness Visit Provided education to patient re: Annual Wellness Visit, care coordination services Assessed social determinant of health barriers          SDOH assessments and interventions completed:  Yes  SDOH Interventions Today    Flowsheet Row Most Recent Value  SDOH Interventions   Food Insecurity Interventions Intervention Not Indicated  Housing Interventions Intervention Not Indicated  Transportation Interventions Intervention Not Indicated  Utilities Interventions Intervention Not Indicated  Financial Strain Interventions Intervention Not Indicated  Physical Activity Interventions Intervention Not Indicated  Stress Interventions Intervention Not Indicated        Care Coordination Interventions Activated:  Yes  Care Coordination Interventions:  Yes, provided   Follow up plan: No further intervention required.   Encounter Outcome:  Pt. Visit Completed {THN Tip this will not be part of the note when signed-REQUIRED REPORT FIELD DO NOT DELETE (Optional):[id number] Martin Garter RN, BSN,CCM, [place] Management [phone]

## 2021-12-31 ENCOUNTER — Other Ambulatory Visit: Payer: Self-pay | Admitting: *Deleted

## 2022-01-06 NOTE — Progress Notes (Signed)
Chronic Care Management Pharmacy Note Summary: PharmD FU visit patient doing well overall with no complaints.  BP 118/78 at home yesterday which is about average for him.  Adherent to all meds and copays are affordable.  Recommendations: None - continue current lifestyle  FU 6 months  01/20/2022 Name:  Martin Bailey MRN:  [id number] DOB:  1940/06/26   Subjective: Martin Bailey is an 81 y.o. year old male who is a primary patient of Hunter, [name], MD.  The CCM team was consulted for assistance with disease management and care coordination needs.    Engaged with patient by telephone for follow up visit in response to provider referral for pharmacy case management and/or care coordination services.   Consent to Services:  The patient was given the following information about Chronic Care Management services today, agreed to services, and gave verbal consent: 1. CCM service includes personalized support from designated clinical staff supervised by the primary care provider, including individualized plan of care and coordination with other care providers 2. 24/7 contact phone numbers for assistance for urgent and routine care needs. 3. Service will only be billed when office clinical staff spend 20 minutes or more in a month to coordinate care. 4. Only one practitioner may furnish and bill the service in a calendar month. 5.The patient may stop CCM services at any time (effective at the end of the month) by phone call to the office staff. 6. The patient will be responsible for cost sharing (co-pay) of up to 20% of the service fee (after annual deductible is met). Patient agreed to services and consent obtained.  Patient Care Team: [name], MD as PCP - General (Family Medicine) Donato Heinz, MD as PCP - Cardiology (Cardiology) Jule Ser as Consulting Physician (Dentistry) Valrie Hart, RN as Oncology Nurse Navigator Rosana Hoes, Jerilynn Mages, [facility]  (Pharmacist)  Recent office visits:  None   Recent consult visits:  None   Hospital visits:  None in previous 6 months   Objective:  Lab Results  Component Value Date   CREATININE 1.06 01/08/2022   BUN 21 01/08/2022   GFR 67.58 09/11/2021   GFRNONAA >60 01/08/2022   GFRAA >60 12/20/2019   NA 136 01/08/2022   K 4.7 01/08/2022   CALCIUM 8.8 (L) 01/08/2022   CO2 25 01/08/2022   GLUCOSE 114 (H) 01/08/2022    Lab Results  Component Value Date/Time   HGBA1C 5.5 06/28/2014 08:44 AM   HGBA1C 5.7 04/12/2013 07:54 AM   GFR 67.58 09/11/2021 11:08 AM   GFR 66.28 04/04/2021 08:33 AM    Last diabetic Eye exam: No results found for: "HMDIABEYEEXA"  Last diabetic Foot exam: No results found for: "HMDIABFOOTEX"   Lab Results  Component Value Date   CHOL 181 09/11/2021   HDL 51.10 09/11/2021   LDLCALC 107 (H) 09/11/2021   LDLDIRECT 62 09/09/2020   TRIG 112.0 09/11/2021   CHOLHDL 4 09/11/2021       Latest Ref Rng & Units 01/08/2022   10:53 AM 09/11/2021   11:08 AM 07/08/2021    3:08 PM  Hepatic Function  Total Protein 6.5 - 8.1 g/dL 6.7  6.9  7.0   Albumin 3.5 - 5.0 g/dL 4.0  4.2  4.2   AST 15 - 41 U/L '23  20  19   ' ALT 0 - 44 U/L '22  14  14   ' Alk Phosphatase 38 - 126 U/L 54  56  54   Total Bilirubin  0.3 - 1.2 mg/dL 0.9  0.7  0.8     Lab Results  Component Value Date/Time   TSH 3.60 09/11/2021 11:08 AM   TSH 1.959 01/08/2020 12:33 PM   TSH 2.508 12/20/2019 10:28 AM   TSH 3.63 02/10/2018 09:04 AM   FREET4 0.85 12/21/2014 09:39 AM       Latest Ref Rng & Units 01/08/2022   10:53 AM 09/11/2021   11:08 AM 07/08/2021    3:08 PM  CBC  WBC 4.0 - 10.5 K/uL 7.7  7.4  7.3   Hemoglobin 13.0 - 17.0 g/dL 13.8  14.5  14.5   Hematocrit 39.0 - 52.0 % 39.9  42.7  42.0   Platelets 150 - 400 K/uL 169  180.0  137     No results found for: "VD25OH"  Clinical ASCVD: No  The 10-year ASCVD risk score (Arnett DK, et al., 2019) is: 27.6%   Values used to calculate the score:      Age: 35 years     Sex: Male     Is Non-Hispanic African American: No     Diabetic: No     Tobacco smoker: No     Systolic Blood Pressure: 878 mmHg     Is BP treated: Yes     HDL Cholesterol: 51.1 mg/dL     Total Cholesterol: 181 mg/dL       09/11/2021    9:37 AM 10/21/2020   11:21 AM 09/05/2020    8:01 AM  Depression screen PHQ 2/9  Decreased Interest 2 0 0  Down, Depressed, Hopeless 2 0 0  PHQ - 2 Score 4 0 0  Altered sleeping 0  0  Tired, decreased energy 2  0  Change in appetite 0  0  Feeling bad or failure about yourself  1  0  Trouble concentrating 0  0  Moving slowly or fidgety/restless 0  0  Suicidal thoughts 0  0  PHQ-9 Score 7  0  Difficult doing work/chores Not difficult at all Not difficult at all Not difficult at all     Social History   Tobacco Use  Smoking Status Former   Packs/day: 1.50   Years: 10.00   Total pack years: 15.00   Types: Cigarettes   Quit date: 07/08/1977   Years since quitting: 44.5  Smokeless Tobacco Never   BP Readings from Last 3 Encounters:  01/12/22 110/75  09/11/21 110/70  07/10/21 120/71   Pulse Readings from Last 3 Encounters:  01/12/22 76  09/11/21 86  07/10/21 67   Wt Readings from Last 3 Encounters:  01/12/22 206 lb 14.4 oz (93.8 kg)  09/11/21 207 lb 3.2 oz (94 kg)  07/10/21 207 lb 14.4 oz (94.3 kg)   BMI Readings from Last 3 Encounters:  01/12/22 29.69 kg/m  09/11/21 29.73 kg/m  07/10/21 29.83 kg/m    Assessment/Interventions: Review of patient past medical history, allergies, medications, health status, including review of consultants reports, laboratory and other test data, was performed as part of comprehensive evaluation and provision of chronic care management services.   SDOH:  (Social Determinants of Health) assessments and interventions performed: No, done within the year Financial Resource Strain: Low Risk  (12/22/2021)   Overall Financial Resource Strain (CARDIA)    Difficulty of Paying Living  Expenses: Not hard at all    SDOH Interventions    Flowsheet Row Telephone from 12/22/2021 in [place] Visit from 09/11/2021 in [place]  Visit from 12/07/2019 in [place] Visit from 09/08/2019 in [place] Visit from 06/20/2018 in [place] Visit from 02/10/2018 in [place] Interventions Intervention Not Indicated -- -- -- -- --  Housing Interventions Intervention Not Indicated -- -- -- -- --  Transportation Interventions Intervention Not Indicated -- -- -- -- --  Utilities Interventions Intervention Not Indicated -- -- -- -- --  Depression Interventions/Treatment  -- Patient refuses Treatment Counseling Counseling Patient refuses Treatment Medication  Financial Strain Interventions Intervention Not Indicated -- -- -- -- --  Physical Activity Interventions Intervention Not Indicated -- -- -- -- --  Stress Interventions Intervention Not Indicated -- -- -- -- --      Financial Resource Strain: Low Risk  (12/22/2021)   Overall Financial Resource Strain (CARDIA)    Difficulty of Paying Living Expenses: Not hard at all    [place]: No Food Insecurity (12/22/2021)  Housing: Low Risk  (12/22/2021)  Transportation Needs: No Transportation Needs (12/22/2021)  Utilities: Not At Risk (12/22/2021)  Depression (PHQ2-9): Medium Risk (09/11/2021)  Financial Resource Strain: Low Risk  (12/22/2021)  Physical Activity: Sufficiently Active (12/22/2021)  Stress: No Stress Concern Present (12/22/2021)  Tobacco Use: Medium Risk (01/08/2022)    [place]  Allergies  Allergen Reactions   Hydrocodone Other (See Comments)    Lost my appetite, loss of energy     Medications Reviewed Today     Reviewed by Edythe Clarity, [facility]  (Pharmacist) on 01/20/22 at 1110  Med List Status: <None>   Medication Order Taking? Sig Documenting Provider Last Dose Status Informant  amLODipine (NORVASC) 5 MG tablet [id number] Yes TAKE 1 TABLET BY MOUTH  DAILY [name], MD Taking Active   atenolol (TENORMIN) 50 MG tablet [id number] Yes TAKE 1 TABLET BY MOUTH  TWICE DAILY Vivi Barrack, MD Taking Active   atorvastatin (LIPITOR) 10 MG tablet [id number] Yes Take 1 tablet (10 mg total) by mouth daily. [name], MD Taking Active   budesonide-formoterol [facility]) 160-4.5 MCG/ACT inhaler [id number] Yes Inhale 2 puffs into the lungs in the morning and at bedtime. [name], MD Taking Active   clobetasol (TEMOVATE) 0.05 % external solution [id number] Yes Apply 1 application topically 2 (two) times daily. For seborrheic dermatitis- 7 days maximum  Patient taking differently: Apply 1 application  topically 2 (two) times daily as needed (scalp itch).   [name], MD Taking Active   desloratadine (CLARINEX) 5 MG tablet [id number]  Take 5 mg by mouth daily. [provider]  Active   famotidine (PEPCID) 20 MG tablet [id number]  Take 20 mg by mouth 2 (two) times daily. [provider]  Active   levocetirizine (XYZAL) 5 MG tablet [id number] Yes Take 5 mg by mouth every evening. [provider] Taking Active   Multiple Vitamin (MULTIVITAMIN WITH MINERALS) TABS tablet [id number] Yes Take 1 tablet by mouth daily. [provider] Taking Active Self  ondansetron (ZOFRAN ODT) 4 MG disintegrating tablet [id number] Yes Take 1 tablet (4 mg total) by mouth every 8 (eight) hours as needed for nausea or vomiting. [name], MD Taking Active   PFIZER COVID-19 [facility] BIVALENT injection [id number] Yes  [provider] Taking Active   PREVNAR 20 0.5 ML injection [id number] Yes  [provider] Taking Active   triamcinolone cream (KENALOG)  0.5 % [id number] Yes Apply topically 2 (two) times  daily as needed.  Patient taking differently: Apply 1 application  topically 2 (two) times daily as needed (rash).   [name], MD Taking Active   XARELTO 20 MG TABS tablet [id number] Yes TAKE 1 TABLET BY MOUTH  DAILY WITH SUPPER [name], MD Taking Active             Patient Active Problem List   Diagnosis Date Noted   Elevated coronary artery calcium score 05/02/2020   Abnormal findings on diagnostic imaging of lung 12/15/2019   Shortness of breath 12/15/2019   Drug-induced pneumonitis 09/19/2019   Encounter for antineoplastic immunotherapy 03/29/2019   Stage III squamous cell carcinoma of right lung ([place]) 01/06/2019   Goals of care, counseling/discussion 01/06/2019   Cough 09/12/2018   Overweight 08/09/2017   Elevated TSH 09/21/2014   Former smoker 06/21/2014   Alcoholism ([place]) 06/21/2014   Allergic rhinitis 04/06/2014   Hx of adenomatous colonic polyps 03/05/2014   Chronic anticoagulation 03/05/2014   Testosterone deficiency 09/02/2010   Seborrheic dermatitis 07/19/2007   Hyperlipidemia 03/04/2007   Major depression ([place]) 03/04/2007   Essential hypertension 03/04/2007   Atrial fibrillation ([place]) 03/04/2007    Immunization History  Administered Date(s) Administered   Fluad Quad(high Dose 65+) 01/16/2019, 12/23/2021   Influenza Nasal 01/07/2020   Influenza Split 12/22/2011   Influenza Whole 01/18/2008, 01/04/2009   Influenza, High Dose Seasonal PF 01/02/2016, 01/21/2017, 02/10/2018   Influenza,inj,Quad PF,6+ Mos 12/23/2012, 02/20/2014, 12/21/2014   Influenza-Unspecified 01/07/2021   Moderna Sars-Covid-2 Vaccination 04/29/2019, 05/27/2019, 01/31/2020, 07/17/2020, 01/07/2021   PFIZER Comirnaty(Gray Top)Covid-19 Tri-Sucrose Vaccine 12/23/2021   PFIZER(Purple Top)SARS-COV-2 Vaccination 08/02/2021   Pneumococcal Conjugate-13 06/21/2014   Pneumococcal Polysaccharide-23 01/18/2008   Td 11/21/2001, 09/11/2021   Tdap 12/22/2011   Zoster Recombinat  (Shingrix) 02/03/2018, 04/07/2018   Zoster, Live 01/18/2008    Conditions to be addressed/monitored:  HTN, Afib, HLD, Lung Cancer  Care Plan : General Pharmacy (Adult)  Updates made by Edythe Clarity, RPH since 01/20/2022 12:00 AM     Problem: HTN, HLD, Afib   Priority: High  Onset Date: 01/09/2021     Long-Range Goal: Patient-Specific Goal   Start Date: 01/09/2021  Expected End Date: 07/10/2021  Recent Progress: On track  Priority: High  Note:   Current Barriers:  None at this time  Pharmacist Clinical Goal(s):  Patient will maintain control of BP as evidenced by monitoring  through collaboration with PharmD and provider.   Interventions: 1:1 collaboration with [name], MD regarding development and update of comprehensive plan of care as evidenced by provider attestation and co-signature Inter-disciplinary care team collaboration (see longitudinal plan of care) Comprehensive medication review performed; medication list updated in electronic medical record  Hypertension (BP goal <130/80) 01/20/22 -Controlled -Current treatment: Amlodipine 60m daily Appropriate, Effective, Safe, Accessible Atenolol 552mBID Appropriate, Effective, Safe, Accessible -Medications previously tried: none noted  -Current home readings: 118/78 yesterday -Current exercise habits: same see previous -Denies hypotensive/hypertensive symptoms -Educated on BP goals and benefits of medications for prevention of heart attack, stroke and kidney damage; Daily salt intake goal < 2300 mg; Exercise goal of 150 minutes per week; -BP is well controlled, he is adherent to his medications. -No changes needed at this time, continue to monitor and record.  Atrial Fibrillation (Goal: prevent stroke and major bleeding) 01/20/22 -Controlled -Current treatment: Rate control: Atenolol 5070mID Appropriate, Effective, Safe, Accessible Anticoagulation: Xarelto 33m39mily Appropriate, Effective, Safe,  Accessible -Medications  previously tried: Pradaxa -Home BP and HR readings: "controlled"  -Counseled on increased risk of stroke due to Afib and benefits of anticoagulation for stroke prevention; bleeding risk associated with Xarelto and importance of self-monitoring for signs/symptoms of bleeding; -No concerns with HR or bleeding. Medication cost is affordable, no complaints with Xarelto copay. Can assist if needed.  Patient Goals/Self-Care Activities Patient will:  - take medications as prescribed focus on medication adherence by pill counts check blood pressure weekly, document, and provide at future appointments  Follow Up Plan: The care management team will reach out to the patient again over the next 180 days.            Medication Assistance: None required.  Patient affirms current coverage meets needs.  Compliance/Adherence/Medication fill history: Care Gaps: Flu vaccine  Star Meds: Atorvastatin 67mh 11/24/21 90ds  [facility]:  [facility] [place]Phone: [phone]: [phone] [address] [address]Phone: [phone]: [phone] [facility] ([address]ort[address]L[facility] [place]Phone: [phone]: [phone] [address] [address]Phone: [phone]: [phone]   We discussed: Benefits of medication synchronization, packaging and delivery as well as enhanced pharmacist oversight with [employer]. Patient decided to: Continue current medication management strategy  Care Plan and Follow Up Patient Decision:  Patient agrees to Care Plan  and Follow-up.  Plan: The care management team will reach out to the patient again over the next 120 days.  CBeverly Milch PharmD Clinical Pharmacist [place]([phone]

## 2022-01-08 ENCOUNTER — Encounter (HOSPITAL_COMMUNITY): Payer: Self-pay

## 2022-01-08 ENCOUNTER — Inpatient Hospital Stay: Payer: Medicare Other | Attending: Internal Medicine

## 2022-01-08 ENCOUNTER — Other Ambulatory Visit: Payer: Self-pay | Admitting: Physician Assistant

## 2022-01-08 ENCOUNTER — Ambulatory Visit (HOSPITAL_COMMUNITY)
Admission: RE | Admit: 2022-01-08 | Discharge: 2022-01-08 | Disposition: A | Discharge status: Home / Self Care | Payer: Medicare Other | Source: Ambulatory Visit | Attending: Internal Medicine | Admitting: Internal Medicine

## 2022-01-08 DIAGNOSIS — C3490 Malignant neoplasm of unspecified part of unspecified bronchus or lung: Secondary | ICD-10-CM | POA: Diagnosis not present

## 2022-01-08 DIAGNOSIS — J90 Pleural effusion, not elsewhere classified: Secondary | ICD-10-CM | POA: Diagnosis not present

## 2022-01-08 DIAGNOSIS — Z9221 Personal history of antineoplastic chemotherapy: Secondary | ICD-10-CM | POA: Insufficient documentation

## 2022-01-08 DIAGNOSIS — C3491 Malignant neoplasm of unspecified part of right bronchus or lung: Secondary | ICD-10-CM | POA: Diagnosis not present

## 2022-01-08 DIAGNOSIS — I10 Essential (primary) hypertension: Secondary | ICD-10-CM | POA: Insufficient documentation

## 2022-01-08 DIAGNOSIS — C3431 Malignant neoplasm of lower lobe, right bronchus or lung: Secondary | ICD-10-CM | POA: Insufficient documentation

## 2022-01-08 DIAGNOSIS — D61818 Other pancytopenia: Secondary | ICD-10-CM | POA: Insufficient documentation

## 2022-01-08 LAB — CMP (CANCER CENTER ONLY)
ALT: 22 U/L (ref 0–44)
AST: 23 U/L (ref 15–41)
Albumin: 4 g/dL (ref 3.5–5.0)
Alkaline Phosphatase: 54 U/L (ref 38–126)
Anion gap: 6 (ref 5–15)
BUN: 21 mg/dL (ref 8–23)
CO2: 25 mmol/L (ref 22–32)
Calcium: 8.8 mg/dL — ABNORMAL LOW (ref 8.9–10.3)
Chloride: 105 mmol/L (ref 98–111)
Creatinine: 1.06 mg/dL (ref 0.61–1.24)
GFR, Estimated: >60 mL/min (ref >60)
Glucose, Bld: 114 mg/dL — ABNORMAL HIGH (ref 70–99)
Potassium: 4.7 mmol/L (ref 3.5–5.1)
Sodium: 136 mmol/L (ref 135–145)
Total Bilirubin: 0.9 mg/dL (ref 0.3–1.2)
Total Protein: 6.7 g/dL (ref 6.5–8.1)

## 2022-01-08 LAB — CBC WITH DIFFERENTIAL (CANCER CENTER ONLY)
Abs Immature Granulocytes: 0.05 10*3/uL (ref 0.00–0.07)
Basophils Absolute: 0.1 10*3/uL (ref 0.0–0.1)
Basophils Relative: 1 %
Eosinophils Absolute: 0.1 10*3/uL (ref 0.0–0.5)
Eosinophils Relative: 2 %
HCT: 39.9 % (ref 39.0–52.0)
Hemoglobin: 13.8 g/dL (ref 13.0–17.0)
Immature Granulocytes: 1 %
Lymphocytes Relative: 19 %
Lymphs Abs: 1.5 10*3/uL (ref 0.7–4.0)
MCH: 34.1 pg — ABNORMAL HIGH (ref 26.0–34.0)
MCHC: 34.6 g/dL (ref 30.0–36.0)
MCV: 98.5 fL (ref 80.0–100.0)
Monocytes Absolute: 0.8 10*3/uL (ref 0.1–1.0)
Monocytes Relative: 11 %
Neutro Abs: 5.2 10*3/uL (ref 1.7–7.7)
Neutrophils Relative %: 66 %
Platelet Count: 169 10*3/uL (ref 150–400)
RBC: 4.05 MIL/uL — ABNORMAL LOW (ref 4.22–5.81)
RDW: 12.9 % (ref 11.5–15.5)
WBC Count: 7.7 10*3/uL (ref 4.0–10.5)
nRBC: 0 % (ref 0.0–0.2)

## 2022-01-08 MED ORDER — IOHEXOL 300 MG/ML  SOLN
75.0000 mL | Freq: Once | INTRAMUSCULAR | Status: AC | PRN
  Administered 2022-01-08: 75 mL via INTRAVENOUS

## 2022-01-08 MED ORDER — HEPARIN SOD (PORK) LOCK FLUSH 100 UNIT/ML IV SOLN
INTRAVENOUS | Status: AC
  Filled 2022-01-08: qty 5

## 2022-01-08 MED ORDER — HEPARIN SOD (PORK) LOCK FLUSH 100 UNIT/ML IV SOLN
500.0000 [IU] | Freq: Once | INTRAVENOUS | Status: AC
  Administered 2022-01-08: 500 [IU] via INTRAVENOUS

## 2022-01-08 MED ORDER — SODIUM CHLORIDE (PF) 0.9 % IJ SOLN
INTRAMUSCULAR | Status: AC
  Filled 2022-01-08: qty 50

## 2022-01-12 ENCOUNTER — Inpatient Hospital Stay: Payer: Medicare Other | Admitting: Internal Medicine

## 2022-01-12 ENCOUNTER — Other Ambulatory Visit: Payer: Self-pay

## 2022-01-12 VITALS — BP 110/75 | HR 76 | Temp 97.9°F | Resp 16 | Wt 206.9 lb

## 2022-01-12 DIAGNOSIS — D61818 Other pancytopenia: Secondary | ICD-10-CM | POA: Diagnosis not present

## 2022-01-12 DIAGNOSIS — C3490 Malignant neoplasm of unspecified part of unspecified bronchus or lung: Secondary | ICD-10-CM

## 2022-01-12 DIAGNOSIS — I10 Essential (primary) hypertension: Secondary | ICD-10-CM | POA: Diagnosis not present

## 2022-01-12 DIAGNOSIS — C3431 Malignant neoplasm of lower lobe, right bronchus or lung: Secondary | ICD-10-CM | POA: Diagnosis not present

## 2022-01-12 DIAGNOSIS — Z9221 Personal history of antineoplastic chemotherapy: Secondary | ICD-10-CM | POA: Diagnosis not present

## 2022-01-12 NOTE — Progress Notes (Signed)
[place] [phone]   [phone]  OFFICE PROGRESS NOTE  [name], MD [place] [address]  DIAGNOSIS: Stage IIIb (T3, N2, M0) non-small cell lung cancer, squamous cell carcinoma presented with right lower lobe lung mass in addition to subcarinal lymphadenopathy diagnosed in October 2020.    PRIOR THERAPY:  1) Weekly concurrent chemoradiation with Carboplatin for an AUC of 2 Paclitaxel 45 mg/m2. First dose 01/16/2019. Status post 5 cycles.  Last dose was given February 13, 2019. 2) Consolidation treatment with immunotherapy with Imfinzi 1500 mg IV every 4 weeks.  First dose April 05, 2019.  Status post 5 cycles.  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  CURRENT THERAPY:  Observation.  INTERVAL HISTORY: Martin Bailey 81 y.o. male returns to the clinic today for follow-up visit.  The patient is feeling fine today with no concerning complaints except for mild cough.  He denied having any chest pain, shortness of breath or hemoptysis.  He has no nausea, vomiting, diarrhea or constipation.  He has no headache or visual changes.  He denied having any recent weight loss or night sweats.  He continues to play golf at regular basis.  He is here today for evaluation with repeat CT scan of the chest for restaging of his disease.  MEDICAL HISTORY: Past Medical History:  Diagnosis Date   Cataract    left   COLONIC POLYPS, HX OF 04/05/2007   ONE ADENOMATOUS POLYP AND ONE HYPERPLASTIC POLYP   DEPRESSION 03/04/2007   DERMATITIS, SEBORRHEIC 07/19/2007   FIBRILLATION, ATRIAL 03/04/2007   Hearing aid worn    B/L   HYPERLIPIDEMIA 03/04/2007   HYPERTENSION 03/04/2007   Kidney stones    Lung cancer ([place]) dx'd 02/2019   non small cell lung ca   Lung mass    mediastinal adenopathy    ALLERGIES:  is allergic to hydrocodone.  MEDICATIONS:  Current Outpatient Medications  Medication  Sig Dispense Refill   amLODipine (NORVASC) 5 MG tablet TAKE 1 TABLET BY MOUTH  DAILY 90 tablet 3   atenolol (TENORMIN) 50 MG tablet TAKE 1 TABLET BY MOUTH  TWICE DAILY 180 tablet 3   atorvastatin (LIPITOR) 10 MG tablet Take 1 tablet (10 mg total) by mouth daily. 90 tablet 3   budesonide-formoterol (SYMBICORT) 160-4.5 MCG/ACT inhaler Inhale 2 puffs into the lungs in the morning and at bedtime. 3 each 3   clobetasol (TEMOVATE) 0.05 % external solution Apply 1 application topically 2 (two) times daily. For seborrheic dermatitis- 7 days maximum (Patient taking differently: Apply 1 application  topically 2 (two) times daily as needed (scalp itch).) 50 mL 0   desloratadine (CLARINEX) 5 MG tablet Take 5 mg by mouth daily.     famotidine (PEPCID) 20 MG tablet Take 20 mg by mouth 2 (two) times daily.     levocetirizine (XYZAL) 5 MG tablet Take 5 mg by mouth every evening.     Multiple Vitamin (MULTIVITAMIN WITH MINERALS) TABS tablet Take 1 tablet by mouth daily.     ondansetron (ZOFRAN ODT) 4 MG disintegrating tablet Take 1 tablet (4 mg total) by mouth every 8 (eight) hours as needed for nausea or vomiting. 20 tablet 1   PFIZER COVID-19 VAC BIVALENT injection      PREVNAR 20 0.5 ML injection      triamcinolone cream (KENALOG) 0.5 % Apply topically 2 (two) times daily as needed. (Patient taking differently: Apply 1 application  topically 2 (two) times daily as needed (rash).) 30 g 1   XARELTO 20 MG TABS tablet TAKE 1 TABLET BY MOUTH  DAILY WITH SUPPER 90 tablet 3   No current facility-administered medications for this visit.    SURGICAL HISTORY:  Past Surgical History:  Procedure Laterality Date   BIOPSY  01/09/2020   Procedure: BIOPSY;  Surgeon: Laurin Coder, MD;  Location: [facility];  Service: Endoscopy;;   BRONCHIAL WASHINGS  01/09/2020   Procedure: BRONCHIAL WASHINGS;  Surgeon: Laurin Coder, MD;  Location: [facility];  Service: Endoscopy;;   CATARACT EXTRACTION W/ INTRAOCULAR  LENS IMPLANT     right   COLONOSCOPY     IR IMAGING GUIDED PORT INSERTION  01/31/2019   RADIOFREQUENCY ABLATION     x2 for fib, on 2nd attempt switched form a flutter ot a fib.    ROTATOR CUFF REPAIR Right    TONSILLECTOMY     VIDEO BRONCHOSCOPY N/A 01/09/2020   Procedure: VIDEO BRONCHOSCOPY WITH FLUORO;  Surgeon: Laurin Coder, MD;  [facility];  Service: Endoscopy;  Laterality: N/A;   VIDEO BRONCHOSCOPY WITH ENDOBRONCHIAL ULTRASOUND Left 12/30/2018   Procedure: VIDEO BRONCHOSCOPY WITH ENDOBRONCHIAL ULTRASOUND WITH FLUOROSCOPY;  Surgeon: Laurin Coder, MD;  Location: [facility];  Service: Thoracic;  Laterality: Left;   WISDOM TOOTH EXTRACTION      REVIEW OF SYSTEMS:  A comprehensive review of systems was negative.   PHYSICAL EXAMINATION: General appearance: alert, cooperative, and no distress Head: Normocephalic, without obvious abnormality, atraumatic Neck: no adenopathy, no JVD, supple, symmetrical, trachea midline, and thyroid not enlarged, symmetric, no tenderness/mass/nodules Lymph nodes: Cervical, supraclavicular, and axillary nodes normal. Resp: clear to auscultation bilaterally Back: symmetric, no curvature. ROM normal. No CVA tenderness. Cardio: regular rate and rhythm, S1, S2 normal, no murmur, click, rub or gallop GI: soft, non-tender; bowel sounds normal; no masses,  no organomegaly Extremities: extremities normal, atraumatic, no cyanosis or edema  ECOG PERFORMANCE STATUS: 1 - Symptomatic but completely ambulatory  Blood pressure 110/75, pulse 76, temperature 97.9 F (36.6 C), temperature source Oral, resp. rate 16, weight 206 lb 14.4 oz (93.8 kg), SpO2 97 %.  LABORATORY DATA: Lab Results  Component Value Date   WBC 7.7 01/08/2022   HGB 13.8 01/08/2022   HCT 39.9 01/08/2022   MCV 98.5 01/08/2022   PLT 169 01/08/2022      Chemistry      Component Value Date/Time   NA 136 01/08/2022 1053   K 4.7 01/08/2022 1053   CL 105 01/08/2022 1053   CO2 25  01/08/2022 1053   BUN 21 01/08/2022 1053   CREATININE 1.06 01/08/2022 1053      Component Value Date/Time   CALCIUM 8.8 (L) 01/08/2022 1053   ALKPHOS 54 01/08/2022 1053   AST 23 01/08/2022 1053   ALT 22 01/08/2022 1053   BILITOT 0.9 01/08/2022 1053       RADIOGRAPHIC STUDIES: CT Chest W Contrast  Result Date: 01/08/2022 CLINICAL DATA:  Stage IIIB right lower lobe squamous cell lung cancer completed chemoradiation therapy 03/03/2019, completed immunotherapy. Restaging. * Tracking Code: BO * EXAM: CT CHEST WITH CONTRAST TECHNIQUE: Multidetector CT imaging of the chest was performed during intravenous contrast administration. RADIATION DOSE REDUCTION: This exam was performed according to the departmental dose-optimization program which includes automated exposure control, adjustment of the mA and/or kV according to patient size and/or use of iterative reconstruction technique. CONTRAST:  54mL OMNIPAQUE IOHEXOL 300 MG/ML  SOLN COMPARISON:  07/08/2021 chest CT.  FINDINGS: Cardiovascular: Normal heart size. No significant pericardial effusion/thickening. Left anterior descending and left circumflex coronary atherosclerosis. Right internal jugular Port-A-Cath terminates at the cavoatrial junction. Atherosclerotic nonaneurysmal thoracic aorta. Normal caliber pulmonary arteries. No acute central pulmonary embolism. Eccentric sharply marginated small low-attenuation filling defect in the central aspect of the lobar right lower lobe pulmonary artery branch (series 2/image 80), similar to the prior CT, favor chronic treatment related embolus. Mediastinum/Nodes: No significant thyroid nodules. Unremarkable esophagus. No pathologically enlarged axillary, mediastinal or hilar lymph nodes. Lungs/Pleura: No pneumothorax. Small dependent right pleural effusion. No left pleural effusion. Sharply marginated right infrahilar lung consolidation with associated volume loss, bronchiectasis and distortion, stable,  compatible with radiation fibrosis. No acute consolidative airspace disease or new significant pulmonary nodules. Upper abdomen: No acute abnormality. Musculoskeletal: No aggressive appearing focal osseous lesions. Moderate chronic T7 vertebral compression fracture. Moderate thoracic spondylosis. IMPRESSION: 1. Stable right infrahilar radiation fibrosis. No evidence of local tumor recurrence. 2. No evidence of metastatic disease in the chest. 3. Small dependent right pleural effusion. 4. Two-vessel coronary atherosclerosis. 5. Moderate chronic T7 vertebral compression fracture. 6.  Aortic Atherosclerosis (ICD10-I70.0). Electronically Signed   By: Ilona Sorrel M.D.   On: 01/08/2022 13:48    ASSESSMENT AND PLAN: This is a very pleasant 81 years old white male with a stage IIIb non-small cell lung cancer, squamous cell carcinoma diagnosed in October 2020 status post course of concurrent chemoradiation with 5 cycles of chemotherapy with carboplatin and paclitaxel. The patient tolerated his treatment well except for pancytopenia. The patient had partial response after his treatment. He is currently undergoing consolidation treatment with immunotherapy with Imfinzi 1500 mg IV every 4 weeks status post 5  cycles.   His treatment was discontinued because of the persistent and recurrent immunotherapy mediated pneumonitis.  This was treated with a tapered dose of prednisone and improved. The patient has been on observation since that time and he is feeling fine with no concerning complaints. He had repeat CT scan of the chest performed recently.  I personally and independently reviewed the scan and discussed the result with the patient today. His scan showed no concerning findings for disease progression. I recommended for him to continue on observation with repeat CT scan of the chest in 6 months. The patient was advised to call immediately if he has any other concerning symptoms in the interval.  The patient  voices understanding of current disease status and treatment options and is in agreement with the current care plan.  All questions were answered. The patient knows to call the clinic with any problems, questions or concerns. We can certainly see the patient much sooner if necessary.  Disclaimer: This note was dictated with voice recognition software. Similar sounding words can inadvertently be transcribed and may not be corrected upon review.

## 2022-01-13 ENCOUNTER — Ambulatory Visit (INDEPENDENT_AMBULATORY_CARE_PROVIDER_SITE_OTHER): Payer: Medicare Other | Admitting: Psychiatry

## 2022-01-13 DIAGNOSIS — F4321 Adjustment disorder with depressed mood: Secondary | ICD-10-CM | POA: Diagnosis not present

## 2022-01-13 NOTE — Progress Notes (Signed)
PROBLEM-FOCUSED INITIAL PSYCHOTHERAPY EVALUATION Martin Moore, PhD LP [facility]  Name: Lavarious Bastone [facility])          Date: 01/13/2022 Time spent: 50 min MRN: [id number] DOB: 1940-09-15 Guardian/Payee: self  PCP: [name], MD Documentation requested on this visit: No  PROBLEM HISTORY Reason for Visit /Presenting Problem:  Chief Complaint  Patient presents with   Establish Care   Depression    Narrative/History of Present Illness Was initially scheduled with Thayer Headings, NP but stated he is not seeking medication service, appt rescinded and rescheduled for [place].  EHR notes lung cancer with combination chemo, radiation, and immunotherapy.    For himself, Louie Casa states he is 81yo, retired, widowed many years, since daughter was 85 1/2.  Only daughter, lives in [place].  Maybe has too much time to think.  Previous therapy was seeking to improve quality of life, but at this point he really more wants to make peace with the past.  Notes he has a pile of memories of "dumb things" he's done, either just stupid or harmed his own career somehow.  Been terminated a couple times from jobs.  One notable example was when a male put up a sign re-labelling the men's room and a second women's room and Louie Casa posted an article on discrimination on the same door.  Says it was "dumb" because the signmaker was the boss's daughter, and it soured relations with his boss.  Another near 81 when he saw an article on depression, recognized symptoms, went to his PCP, who prescribed Serzone; had a "personal epiphany" realizing he'd been "a jerk" in a number of ways, and came to change his attitude toward frustrating people.  His own father liked children the same way he liked dogs -- as long as they deliver a pleasant experience, don't annoy, and only need something when ready.  Tried to raise his daughter more respectfully, and feels he has succeeded.    More depressing is finding  out things he can't do any more.  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  Misses programming, too, lost opportunity to reorganization and consolidation in [place].  Used to be avid golfer 30 yrs, mid-80s, but this past weekend could hardly hit the ball visiting family.  Woodworking has been another love, with a well-equipped shop.  Still reads, but mostly has lost creative and teaching roles.  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  Some chance daughter and her husband may want him to build a table top for them, if he can still handle the work.  Still faults himself for being "clueless".  Father's voice told him he's lazy, won't amount to nothing.  Siblings got a kinder father.  Louie Casa was the eldest, bookish, first one to go through college in his family.  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  Nutritional therapist, identifies atheist, does not believe in afterlife or anything supernatural.  Has fantasized some about what if his wife could come back, and he could say he loved her more than he did.    Prior Psychiatric Assessment/Treatment:   Outpatient treatment: Two psychologists previously Psychiatric hospitalization: none stated Psychological assessment/testing: none stated   Abuse/neglect screening: Victim of abuse: No.   Victim of neglect: Yes emotional.   Perpetrator of  abuse/neglect: Not assessed at this time / none suspected.   Witness / Exposure to Domestic Violence: Not assessed at this time / none suspected.   Witness to Community Violence:  Not assessed at this time / none suspected.   Protective Services Involvement: No.   Report needed: No.    Substance abuse screening: Current substance abuse: Not assessed at this time / none suspected.   History of impactful substance  use/abuse: Not assessed at this time / none suspected.     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  . Past Medical History:  Diagnosis Date   Cataract    left   COLONIC POLYPS, HX OF 04/05/2007   ONE ADENOMATOUS POLYP AND ONE HYPERPLASTIC POLYP   DEPRESSION 03/04/2007   DERMATITIS, SEBORRHEIC 07/19/2007   FIBRILLATION, ATRIAL 03/04/2007   Hearing aid worn    B/L   HYPERLIPIDEMIA 03/04/2007   HYPERTENSION 03/04/2007   Kidney stones    Lung cancer ([place]) dx'd 02/2019   non small cell lung ca   Lung mass    mediastinal adenopathy     Past Surgical History:  Procedure Laterality Date   BIOPSY  01/09/2020   Procedure: BIOPSY;  Surgeon: Laurin Coder, MD;  [facility];  Service: Endoscopy;;   BRONCHIAL WASHINGS  01/09/2020   Procedure: BRONCHIAL WASHINGS;  Surgeon: Laurin Coder, MD;  [facility];  Service: Endoscopy;;   CATARACT EXTRACTION W/ INTRAOCULAR LENS IMPLANT     right   COLONOSCOPY     IR IMAGING GUIDED PORT INSERTION  01/31/2019   RADIOFREQUENCY ABLATION     x2 for fib, on 2nd attempt switched form a flutter ot a fib.    ROTATOR CUFF REPAIR Right    TONSILLECTOMY     VIDEO BRONCHOSCOPY N/A 01/09/2020   Procedure: VIDEO BRONCHOSCOPY WITH FLUORO;  Surgeon: Laurin Coder, MD;  [facility];  Service: Endoscopy;   Laterality: N/A;   VIDEO BRONCHOSCOPY WITH ENDOBRONCHIAL ULTRASOUND Left 12/30/2018   Procedure: VIDEO BRONCHOSCOPY WITH ENDOBRONCHIAL ULTRASOUND WITH FLUOROSCOPY;  Surgeon: Laurin Coder, MD;  Location: [facility];  Service: Thoracic;  Laterality: Left;   WISDOM TOOTH EXTRACTION      Allergies  Allergen Reactions   Hydrocodone Other (See Comments)    Lost my appetite, loss of energy     Medications (as listed in Epic): Current Outpatient Medications  Medication Sig Dispense Refill   amLODipine (NORVASC) 5 MG tablet TAKE 1 TABLET BY MOUTH  DAILY 100 tablet 2   atenolol (TENORMIN) 50 MG tablet TAKE 1 TABLET BY MOUTH TWICE  DAILY 200 tablet 2   atorvastatin (LIPITOR) 10 MG tablet TAKE 1 TABLET BY MOUTH DAILY 100 tablet 2   budesonide-formoterol (SYMBICORT) 160-4.5 MCG/ACT inhaler Inhale 2 puffs into the lungs in the morning and at bedtime. 3 each 3   clobetasol (TEMOVATE) 0.05 % external solution Apply 1 application topically 2 (two) times daily. For seborrheic dermatitis- 7 days maximum (Patient taking differently: Apply 1 application  topically 2 (two) times daily as needed (scalp itch).) 50 mL 0   levocetirizine (XYZAL) 5 MG tablet Take 5 mg by mouth as needed.     Multiple Vitamin (MULTIVITAMIN WITH MINERALS) TABS tablet Take 1 tablet by mouth daily.     ondansetron (ZOFRAN ODT) 4 MG disintegrating tablet Take 1 tablet (4 mg total) by mouth every 8 (eight) hours as needed for nausea or vomiting. 20 tablet 1   triamcinolone cream (KENALOG) 0.5 % Apply topically 2 (two) times daily as needed. (Patient taking differently: Apply 1 application  topically 2 (two) times daily as needed (rash).) 30 g 1   XARELTO 20 MG TABS tablet TAKE 1 TABLET BY MOUTH DAILY  WITH SUPPER 100 tablet 2   No current facility-administered medications for this visit.    MENTAL STATUS AND OBSERVATIONS Appearance:   Casual     Behavior:  Appropriate  Motor:  Normal  Speech/Language:   Clear and Coherent  Affect:   Appropriate  Mood:  dysthymic  Thought process:  normal  Thought content:    WNL  Sensory/Perceptual disturbances:    WNL  Orientation:  Fully oriented  Attention:  Good  Concentration:  Good  Memory:  WNL  Fund of knowledge:   Good  Insight:    Good  Judgment:   Good  Impulse Control:  Good   Initial Risk Assessment: Danger to self: No Self-injurious behavior: No Danger to others: No Physical aggression / violence: No Duty to warn: No Access to firearms a concern: No Gang involvement: No Patient / guardian was educated about steps to take if suicide or homicide risk level increases between visits: yes While future psychiatric events cannot be accurately predicted, the patient does not currently require acute inpatient psychiatric care and does not currently meet [facility] involuntary commitment criteria.   DIAGNOSIS:    ICD-10-CM   1. Adjustment disorder with depressed mood  F43.21    r/o MDD, early onset Dysthymia      INITIAL TREATMENT: Support/validation provided for distressing symptoms and confirmed rapport Ethical orientation and informed consent confirmed re: privacy rights -- including but not limited to HIPAA, EMR and use of e-PHI patient responsibilities -- scheduling, fair notice of changes, in-person vs. telehealth and regulatory and financial conditions affecting choice expectations for working relationship in psychotherapy needs and consents for working partnerships and exchange of information with other health care providers, especially any medication and other behavioral health providers Initial orientation to cognitive-behavioral and solution-focused therapy approach Psychoeducation and initial recommendations: Validated having critical feelings and wanting to settle accounts at this stage, challenged whether he's being too harsh with himself just as father was with him originally, playing out the very thing that bothered him. Introduced the idea of  gestalt conversations, imagining the deceased (F, W especially) and having completing, hopefully blessing conversations with them  Outlook for therapy -- scheduling constraints, availability of crisis service, inclusion of family member(s) as appropriate  Plan: Consider questions, for self-forgiveness and making peace with the past: How old do you have to be before you have the right to evaluate yourself, on your own terms, being as evenhanded as you wanted your own parents to be? What if F could be brought back -- benefit of full afterlife perspective, would he see it differently? What if W returned for a followup conversation -- what would she see, say, offer by way of review? Emphasis on tending to priority time, for not knowing how much life and ability  he has left.  Encouragement to further the cause of his music [place], explore what handiwork he can do, find opportunities to get back in front of kids with music again if desired Maintain medication as prescribed and work faithfully with relevant prescriber(s) if any changes are desired or seem indicated Call the clinic on-call service, present to ER, or call 911 if any life-threatening psychiatric crisis Return for time at discretion.  Blanchie Serve, PhD  Martin Moore, PhD LP Clinical Psychologist, [facility] [address] [place], [place] [address] [phone]

## 2022-01-20 ENCOUNTER — Ambulatory Visit: Payer: Medicare Other

## 2022-01-20 ENCOUNTER — Telehealth: Payer: Self-pay | Admitting: Internal Medicine

## 2022-01-20 DIAGNOSIS — I10 Essential (primary) hypertension: Secondary | ICD-10-CM

## 2022-01-20 DIAGNOSIS — I4811 Longstanding persistent atrial fibrillation: Secondary | ICD-10-CM

## 2022-01-20 NOTE — Telephone Encounter (Signed)
Scheduled per 10/16 los, patient has been called and voicemail was left.

## 2022-01-20 NOTE — Patient Instructions (Addendum)
Visit Information   Goals Addressed             This Visit's Progress    Track and Manage My Blood Pressure-Hypertension   On track    Timeframe:  Long-Range Goal Priority:  High Start Date:  01/09/21                           Expected End Date:  07/10/21                     Follow Up Date 04/11/21    - check blood pressure weekly - choose a place to take my blood pressure (home, clinic or office, retail store) - write blood pressure results in a log or diary    Why is this important?   You won't feel high blood pressure, but it can still hurt your blood vessels.  High blood pressure can cause heart or kidney problems. It can also cause a stroke.  Making lifestyle changes like losing a little weight or eating less salt will help.  Checking your blood pressure at home and at different times of the day can help to control blood pressure.  If the doctor prescribes medicine remember to take it the way the doctor ordered.  Call the office if you cannot afford the medicine or if there are questions about it.     Notes:        Patient Care Plan: General Pharmacy (Adult)     Problem Identified: HTN, HLD, Afib   Priority: High  Onset Date: 01/09/2021     Long-Range Goal: Patient-Specific Goal   Start Date: 01/09/2021  Expected End Date: 07/10/2021  Recent Progress: On track  Priority: High  Note:   Current Barriers:  None at this time  Pharmacist Clinical Goal(s):  Patient will maintain control of BP as evidenced by monitoring  through collaboration with PharmD and provider.   Interventions: 1:1 collaboration with [name], MD regarding development and update of comprehensive plan of care as evidenced by provider attestation and co-signature Inter-disciplinary care team collaboration (see longitudinal plan of care) Comprehensive medication review performed; medication list updated in electronic medical record  Hypertension (BP goal <130/80)  01/20/22 -Controlled -Current treatment: Amlodipine 5mg  daily Appropriate, Effective, Safe, Accessible Atenolol 50mg  BID Appropriate, Effective, Safe, Accessible -Medications previously tried: none noted  -Current home readings: 118/78 yesterday -Current exercise habits: same see previous -Denies hypotensive/hypertensive symptoms -Educated on BP goals and benefits of medications for prevention of heart attack, stroke and kidney damage; Daily salt intake goal < 2300 mg; Exercise goal of 150 minutes per week; -BP is well controlled, he is adherent to his medications. -No changes needed at this time, continue to monitor and record.  Atrial Fibrillation (Goal: prevent stroke and major bleeding) 01/20/22 -Controlled -Current treatment: Rate control: Atenolol 50mg  BID Appropriate, Effective, Safe, Accessible Anticoagulation: Xarelto 20mg  daily Appropriate, Effective, Safe, Accessible -Medications previously tried: Pradaxa -Home BP and HR readings: "controlled"  -Counseled on increased risk of stroke due to Afib and benefits of anticoagulation for stroke prevention; bleeding risk associated with Xarelto and importance of self-monitoring for signs/symptoms of bleeding; -No concerns with HR or bleeding. Medication cost is affordable, no complaints with Xarelto copay. Can assist if needed.  Patient Goals/Self-Care Activities Patient will:  - take medications as prescribed focus on medication adherence by pill counts check blood pressure weekly, document, and provide at future appointments  Follow Up Plan:  The care management team will reach out to the patient again over the next 180 days.           The patient verbalized understanding of instructions, educational materials, and care plan provided today and DECLINED offer to receive copy of patient instructions, educational materials, and care plan.  Telephone follow up appointment with pharmacy team member scheduled for: 6  months  Edythe Clarity, [place], PharmD Clinical Pharmacist  [facility] [phone]

## 2022-01-26 ENCOUNTER — Other Ambulatory Visit: Payer: Self-pay | Admitting: Family Medicine

## 2022-03-10 ENCOUNTER — Ambulatory Visit (INDEPENDENT_AMBULATORY_CARE_PROVIDER_SITE_OTHER): Payer: Medicare Other | Admitting: Family Medicine

## 2022-03-10 ENCOUNTER — Encounter: Payer: Self-pay | Admitting: Family Medicine

## 2022-03-10 VITALS — BP 108/76 | HR 69 | Temp 97.0°F | Ht 70.0 in | Wt 209.6 lb

## 2022-03-10 DIAGNOSIS — E785 Hyperlipidemia, unspecified: Secondary | ICD-10-CM

## 2022-03-10 DIAGNOSIS — I4811 Longstanding persistent atrial fibrillation: Secondary | ICD-10-CM

## 2022-03-10 DIAGNOSIS — C3491 Malignant neoplasm of unspecified part of right bronchus or lung: Secondary | ICD-10-CM

## 2022-03-10 DIAGNOSIS — L989 Disorder of the skin and subcutaneous tissue, unspecified: Secondary | ICD-10-CM

## 2022-03-10 DIAGNOSIS — I10 Essential (primary) hypertension: Secondary | ICD-10-CM | POA: Diagnosis not present

## 2022-03-10 DIAGNOSIS — R931 Abnormal findings on diagnostic imaging of heart and coronary circulation: Secondary | ICD-10-CM | POA: Diagnosis not present

## 2022-03-10 NOTE — Progress Notes (Signed)
Phone [phone] In person visit   Subjective:   Martin Bailey is a 81 y.o. year old very pleasant male patient who presents for/with See problem oriented charting Chief Complaint  Patient presents with   Follow-up   Hyperlipidemia   Hypertension    Past Medical History-  Patient Active Problem List   Diagnosis Date Noted   Elevated coronary artery calcium score 05/02/2020    Priority: High   Stage III squamous cell carcinoma of right lung ([place]) 01/06/2019    Priority: High   Alcoholism ([place]) 06/21/2014    Priority: High   Atrial fibrillation ([place]) 03/04/2007    Priority: High   Elevated TSH 09/21/2014    Priority: Medium    Seborrheic dermatitis 07/19/2007    Priority: Medium    Hyperlipidemia 03/04/2007    Priority: Medium    Major depression ([place]) 03/04/2007    Priority: Medium    Essential hypertension 03/04/2007    Priority: Medium    Former smoker 06/21/2014    Priority: Low   Allergic rhinitis 04/06/2014    Priority: Low   Hx of adenomatous colonic polyps 03/05/2014    Priority: Low   Chronic anticoagulation 03/05/2014    Priority: Low   Testosterone deficiency 09/02/2010    Priority: Low   Abnormal findings on diagnostic imaging of lung 12/15/2019   Shortness of breath 12/15/2019   Drug-induced pneumonitis 09/19/2019   Encounter for antineoplastic immunotherapy 03/29/2019   Goals of care, counseling/discussion 01/06/2019   Cough 09/12/2018   Overweight 08/09/2017    Medications- reviewed and updated Current Outpatient Medications  Medication Sig Dispense Refill   amLODipine (NORVASC) 5 MG tablet TAKE 1 TABLET BY MOUTH  DAILY 100 tablet 2   atenolol (TENORMIN) 50 MG tablet TAKE 1 TABLET BY MOUTH TWICE  DAILY 180 tablet 1   atorvastatin (LIPITOR) 10 MG tablet Take 1 tablet (10 mg total) by mouth daily. 90 tablet 3   budesonide-formoterol (SYMBICORT) 160-4.5 MCG/ACT inhaler Inhale 2 puffs into the lungs in the morning and at bedtime. 3 each 3    clobetasol (TEMOVATE) 0.05 % external solution Apply 1 application topically 2 (two) times daily. For seborrheic dermatitis- 7 days maximum (Patient taking differently: Apply 1 application  topically 2 (two) times daily as needed (scalp itch).) 50 mL 0   levocetirizine (XYZAL) 5 MG tablet Take 5 mg by mouth every evening.     Multiple Vitamin (MULTIVITAMIN WITH MINERALS) TABS tablet Take 1 tablet by mouth daily.     ondansetron (ZOFRAN ODT) 4 MG disintegrating tablet Take 1 tablet (4 mg total) by mouth every 8 (eight) hours as needed for nausea or vomiting. 20 tablet 1   triamcinolone cream (KENALOG) 0.5 % Apply topically 2 (two) times daily as needed. (Patient taking differently: Apply 1 application  topically 2 (two) times daily as needed (rash).) 30 g 1   XARELTO 20 MG TABS tablet TAKE 1 TABLET BY MOUTH  DAILY WITH SUPPER 90 tablet 3   desloratadine (CLARINEX) 5 MG tablet Take 5 mg by mouth daily.     famotidine (PEPCID) 20 MG tablet Take 20 mg by mouth 2 (two) times daily.     No current facility-administered medications for this visit.     Objective:  BP 108/76   Pulse 69   Temp (!) 97 F (36.1 C)   Ht 5\' 10"  (1.778 m)   Wt 209 lb 9.6 oz (95.1 kg)   SpO2 99%   BMI 30.07 kg/m  Gen:  NAD, resting comfortably CV: RRR no murmurs rubs or gallops Lungs: CTAB no crackles, wheeze, rhonchi Ext: no edema Skin: warm, dry     Assessment and Plan   # Skin lesion S:noted on left ear- flesh colored raised- sometimes scabs over then comes off but never fully heals  A/P: want to rule out skin cancer- refer to dermatology   # stage III squamous cell carcinoma of the lungs S:  diagnosed October 2020-status post course of concurrent chemoradiation with 5 cycles of chemotherapy.  Did develop pancytopenia.  - with chemoradiation- Patient had partial response with initial treatment and underwent consolidation treatment with immunotherapy Imfinzi  x 5 cycles but developed pneumonitis and diffuse  itching and ultimately stopped - also dealing with pneumonitis possibly related to the imfinzi-patient on Symbicort- 1 puff twice a day A/P: good report recently on CT- next visit in march- currently off therapy -still on symbicort for prior pneumonitis but reports [facility] told him could use as needed/cut down  #Persistent atrial fibrillation S: Rate controlled with atenolol 50mg  Anticoagulated with Xarelto 20mg  Patient is not followed by cardiology  -no chest pain or shortness of breath reported. Still working out regularly.  A/P: appropriately anticoagulated and rate controlled- continue current medicine   #hypertension S: medication:  atenolol 50mg , amlodipine 5 mg- actually taking full tablet 3x a week- drops dose if too low BP Readings from Last 3 Encounters:  03/10/22 108/76  01/12/22 110/75  09/11/21 110/70  A/P:  stable- continue current medicines   #CAD based on ct cardiac scoring 298 in 2021 #hyperlipidemia S: Medication:atorvastatin 10 mg daily- we think he may have been off statin last check - rosuvastatin caused itching Lab Results  Component Value Date   CHOL 181 09/11/2021   HDL 51.10 09/11/2021   LDLCALC 107 (H) 09/11/2021   LDLDIRECT 62 09/09/2020   TRIG 112.0 09/11/2021   CHOLHDL 4 09/11/2021   A/P: we discussed checking LDL but wants to hold until physical- last LDL slightly high but now on atorvastatin 10 mg and hoping improve -not on asa as on xarelto for a fib- together bleeding risk would be too high   # Depression -saw psychiatrist but he is not interested in meds- they did not charge him and referred him to psychology within office but was not a good match - he wants to hold off on further visits - still his main desire is for companionship but does not believe this will happen -still playing bridge -does have an old roommate he spends some time with- but depth is more superficial as another friend is being brought along now - still drinking 3-4 beverages  per day but he is not interested in changing.  Lab Results  Component Value Date   ALT 22 01/08/2022   AST 23 01/08/2022   ALKPHOS 54 01/08/2022   BILITOT 0.9 01/08/2022   #Mildly elevated TSH-looks good on last check and has been followed by oncology - or [place]- repeat in 6 months Lab Results  Component Value Date   TSH 3.60 09/11/2021   #CCM- wants to discontinue this with pharmacist  Recommended follow up: Return in about 6 months (around 09/09/2022) for physical or sooner if needed.Schedule b4 you leave. Future Appointments  Date Time Provider [place]  07/10/2022 11:00 AM [facility] None  07/13/2022  3:15 PM Curt Bears, MD [facility] None   Lab/Order associations:   ICD-10-CM   1. Skin lesion  L98.9 Ambulatory referral to Dermatology  CANCELED: Ambulatory referral to Dermatology    2. Longstanding persistent atrial fibrillation (HCC)  I48.11     3. Stage III squamous cell carcinoma of right lung (HCC)  C34.91     4. Elevated coronary artery calcium score  R93.1     5. Hyperlipidemia, unspecified hyperlipidemia type  E78.5     6. Essential hypertension  I10       No orders of the defined types were placed in this encounter.   Return precautions advised.  Garret Reddish, MD

## 2022-03-10 NOTE — Patient Instructions (Addendum)
Health Maintenance Due  Topic Date Due   Medicare Annual Wellness (AWV)  10/06/2017  You are eligible to schedule your annual wellness visit with our nurse specialist Otila Kluver.  Please consider scheduling this before you leave today  We will call you within two weeks about your referral to Dermatology in [place]. If you do not hear within 2 weeks, give [place] a call.   Recommended follow up: Return in about 6 months (around 09/09/2022) for physical or sooner if needed.Schedule b4 you leave.

## 2022-03-12 DIAGNOSIS — C44229 Squamous cell carcinoma of skin of left ear and external auricular canal: Secondary | ICD-10-CM | POA: Diagnosis not present

## 2022-03-12 DIAGNOSIS — L821 Other seborrheic keratosis: Secondary | ICD-10-CM | POA: Diagnosis not present

## 2022-03-12 DIAGNOSIS — L570 Actinic keratosis: Secondary | ICD-10-CM | POA: Diagnosis not present

## 2022-03-12 DIAGNOSIS — C44629 Squamous cell carcinoma of skin of left upper limb, including shoulder: Secondary | ICD-10-CM | POA: Diagnosis not present

## 2022-03-16 ENCOUNTER — Other Ambulatory Visit: Payer: Self-pay | Admitting: Family Medicine

## 2022-03-17 ENCOUNTER — Ambulatory Visit (INDEPENDENT_AMBULATORY_CARE_PROVIDER_SITE_OTHER): Payer: Medicare Other

## 2022-03-17 VITALS — Wt 209.0 lb

## 2022-03-17 DIAGNOSIS — Z0000 Encounter for general adult medical examination without abnormal findings: Secondary | ICD-10-CM | POA: Diagnosis not present

## 2022-03-17 NOTE — Patient Instructions (Signed)
Martin Bailey , Thank you for taking time to come for your Medicare Wellness Visit. I appreciate your ongoing commitment to your health goals. Please review the following plan we discussed and let me know if I can assist you in the future.   These are the goals we discussed:  Goals      Maintain current health      Patient Stated     Stay active and healthy      [place] (see longitudinal plan of care for additional care plan information)  Current Barriers:  Chronic Disease Management support, education, and care coordination needs related to Hypertension, Hyperlipidemia, and Atrial Fibrillation   Hypertension BP Readings from Last 3 Encounters:  02/29/20 102/70  02/07/20 122/78  02/01/20 136/70  Pharmacist Clinical Goal(s): Over the next 365 days, patient will work with PharmD and providers to maintain BP goal <130/80 Current regimen:  Atenolol 50 mg twice daily (heart rate control in atrial fibrillation, blood pressure) Interventions: We reviewed diet and exercise - Maintain a healthy weight and exercise regularly, as directed by your health care provider. Eat healthy foods, such as: Lean proteins, complex carbohydrates, fresh fruits and vegetables, low-fat dairy products, healthy fats. Patient self care activities - Over the next 365 days, patient will: Check BP at least once every 1-2 weeks, document, and provide at future appointments Ensure daily salt intake < 2300 mg/day  Hyperlipidemia Lab Results  Component Value Date/Time   LDLCALC 125 (H) 02/19/2020 08:45 AM   LDLDIRECT 91.0 02/09/2017 08:55 AM  Pharmacist Clinical Goal(s): Over the next 90 days, patient will work with PharmD and providers to achieve LDL goal < 70 Current regimen:  No medications at present - awaiting coronary artery calcification results  Interventions: Reviewed side effects of statin medications. Patient self care activities - Over the next 365 days, patient  will: Reach out with any questions  Stroke prevention in atrial fibrillation Pharmacist Clinical Goal(s) Over the next 365 days, patient will work with PharmD and providers to ensure medication affordability and accessibility Current regimen:  Xarelto 20 mg once every night with supper Interventions: Review of side effects including bleed risk - no problems noted. Review of patient assistance - declined current need Patient self care activities - Over the next 365 days, patient will: Continue current management  Medication management Pharmacist Clinical Goal(s): Over the next 365 days, patient will work with PharmD and providers to maintain optimal medication adherence Current pharmacy: [facility] Interventions Comprehensive medication review performed. Continue current medication management strategy Patient self care activities - Over the next 365 days, patient will: Take medications as prescribed Report any questions or concerns to PharmD and/or provider(s) Initial goal documentation.     Track and Manage My Blood Pressure-Hypertension     Timeframe:  Long-Range Goal Priority:  High Start Date:  01/09/21                           Expected End Date:  07/10/21                     Follow Up Date 04/11/21    - check blood pressure weekly - choose a place to take my blood pressure (home, clinic or office, retail store) - write blood pressure results in a log or diary    Why is this important?   You won't feel high blood pressure, but it can  still hurt your blood vessels.  High blood pressure can cause heart or kidney problems. It can also cause a stroke.  Making lifestyle changes like losing a little weight or eating less salt will help.  Checking your blood pressure at home and at different times of the day can help to control blood pressure.  If the doctor prescribes medicine remember to take it the way the doctor ordered.  Call the office if you cannot afford the medicine or  if there are questions about it.     Notes:         This is a list of the screening recommended for you and due dates:  Health Maintenance  Topic Date Due   COVID-19 Vaccine (8 - 2023-24 season) 03/26/2022*   Medicare Annual Wellness Visit  03/18/2023   DTaP/Tdap/Td vaccine (4 - Td or Tdap) 09/12/2031   Pneumonia Vaccine  Completed   Flu Shot  Completed   Hepatitis C Screening: USPSTF Recommendation to screen - Ages 18-79 yo.  Completed   Zoster (Shingles) Vaccine  Completed   HPV Vaccine  Aged Out   Colon Cancer Screening  Discontinued  *Topic was postponed. The date shown is not the original due date.    Advanced directives: Please bring a copy of your health care power of attorney and living will to the office at your convenience.  Conditions/risks identified: stay active   Next appointment: Follow up in one year for your annual wellness visit.   Preventive Care 24 Years and Older, Male  Preventive care refers to lifestyle choices and visits with your health care provider that can promote health and wellness. What does preventive care include? A yearly physical exam. This is also called an annual well check. Dental exams once or twice a year. Routine eye exams. Ask your health care provider how often you should have your eyes checked. Personal lifestyle choices, including: Daily care of your teeth and gums. Regular physical activity. Eating a healthy diet. Avoiding tobacco and drug use. Limiting alcohol use. Practicing safe sex. Taking low doses of aspirin every day. Taking vitamin and mineral supplements as recommended by your health care provider. What happens during an annual well check? The services and screenings done by your health care provider during your annual well check will depend on your age, overall health, lifestyle risk factors, and family history of disease. Counseling  Your health care provider may ask you questions about your: Alcohol  use. Tobacco use. Drug use. Emotional well-being. Home and relationship well-being. Sexual activity. Eating habits. History of falls. Memory and ability to understand (cognition). Work and work Statistician. Screening  You may have the following tests or measurements: Height, weight, and BMI. Blood pressure. Lipid and cholesterol levels. These may be checked every 5 years, or more frequently if you are over 71 years old. Skin check. Lung cancer screening. You may have this screening every year starting at age 80 if you have a 30-pack-year history of smoking and currently smoke or have quit within the past 15 years. Fecal occult blood test (FOBT) of the stool. You may have this test every year starting at age 77. Flexible sigmoidoscopy or colonoscopy. You may have a sigmoidoscopy every 5 years or a colonoscopy every 10 years starting at age 24. Prostate cancer screening. Recommendations will vary depending on your family history and other risks. Hepatitis C blood test. Hepatitis B blood test. Sexually transmitted disease (STD) testing. Diabetes screening. This is done by checking your blood sugar (  glucose) after you have not eaten for a while (fasting). You may have this done every 1-3 years. Abdominal aortic aneurysm (AAA) screening. You may need this if you are a current or former smoker. Osteoporosis. You may be screened starting at age 73 if you are at high risk. Talk with your health care provider about your test results, treatment options, and if necessary, the need for more tests. Vaccines  Your health care provider may recommend certain vaccines, such as: Influenza vaccine. This is recommended every year. Tetanus, diphtheria, and acellular pertussis (Tdap, Td) vaccine. You may need a Td booster every 10 years. Zoster vaccine. You may need this after age 37. Pneumococcal 13-valent conjugate (PCV13) vaccine. One dose is recommended after age 44. Pneumococcal polysaccharide  (PPSV23) vaccine. One dose is recommended after age 39. Talk to your health care provider about which screenings and vaccines you need and how often you need them. This information is not intended to replace advice given to you by your health care provider. Make sure you discuss any questions you have with your health care provider. Document Released: 04/12/2015 Document Revised: 12/04/2015 Document Reviewed: 01/15/2015 Elsevier Interactive Patient Education  2017 [employer] Prevention in the Home Falls can cause injuries. They can happen to people of all ages. There are many things you can do to make your home safe and to help prevent falls. What can I do on the outside of my home? Regularly fix the edges of walkways and driveways and fix any cracks. Remove anything that might make you trip as you walk through a door, such as a raised step or threshold. Trim any bushes or trees on the path to your home. Use bright outdoor lighting. Clear any walking paths of anything that might make someone trip, such as rocks or tools. Regularly check to see if handrails are loose or broken. Make sure that both sides of any steps have handrails. Any raised decks and porches should have guardrails on the edges. Have any leaves, snow, or ice cleared regularly. Use sand or salt on walking paths during winter. Clean up any spills in your garage right away. This includes oil or grease spills. What can I do in the bathroom? Use night lights. Install grab bars by the toilet and in the tub and shower. Do not use towel bars as grab bars. Use non-skid mats or decals in the tub or shower. If you need to sit down in the shower, use a plastic, non-slip stool. Keep the floor dry. Clean up any water that spills on the floor as soon as it happens. Remove soap buildup in the tub or shower regularly. Attach bath mats securely with double-sided non-slip rug tape. Do not have throw rugs and other things on the  floor that can make you trip. What can I do in the bedroom? Use night lights. Make sure that you have a light by your bed that is easy to reach. Do not use any sheets or blankets that are too big for your bed. They should not hang down onto the floor. Have a firm chair that has side arms. You can use this for support while you get dressed. Do not have throw rugs and other things on the floor that can make you trip. What can I do in the kitchen? Clean up any spills right away. Avoid walking on wet floors. Keep items that you use a lot in easy-to-reach places. If you need to reach something above you, use  a strong step stool that has a grab bar. Keep electrical cords out of the way. Do not use floor polish or wax that makes floors slippery. If you must use wax, use non-skid floor wax. Do not have throw rugs and other things on the floor that can make you trip. What can I do with my stairs? Do not leave any items on the stairs. Make sure that there are handrails on both sides of the stairs and use them. Fix handrails that are broken or loose. Make sure that handrails are as long as the stairways. Check any carpeting to make sure that it is firmly attached to the stairs. Fix any carpet that is loose or worn. Avoid having throw rugs at the top or bottom of the stairs. If you do have throw rugs, attach them to the floor with carpet tape. Make sure that you have a light switch at the top of the stairs and the bottom of the stairs. If you do not have them, ask someone to add them for you. What else can I do to help prevent falls? Wear shoes that: Do not have high heels. Have rubber bottoms. Are comfortable and fit you well. Are closed at the toe. Do not wear sandals. If you use a stepladder: Make sure that it is fully opened. Do not climb a closed stepladder. Make sure that both sides of the stepladder are locked into place. Ask someone to hold it for you, if possible. Clearly mark and make  sure that you can see: Any grab bars or handrails. First and last steps. Where the edge of each step is. Use tools that help you move around (mobility aids) if they are needed. These include: Canes. Walkers. Scooters. Crutches. Turn on the lights when you go into a dark area. Replace any light bulbs as soon as they burn out. Set up your furniture so you have a clear path. Avoid moving your furniture around. If any of your floors are uneven, fix them. If there are any pets around you, be aware of where they are. Review your medicines with your doctor. Some medicines can make you feel dizzy. This can increase your chance of falling. Ask your doctor what other things that you can do to help prevent falls. This information is not intended to replace advice given to you by your health care provider. Make sure you discuss any questions you have with your health care provider. Document Released: 01/10/2009 Document Revised: 08/22/2015 Document Reviewed: 04/20/2014 Elsevier Interactive Patient Education  2017 [employer].

## 2022-03-17 NOTE — Progress Notes (Signed)
I connected with  [name] on 03/17/22 by a audio enabled telemedicine application and verified that I am speaking with the correct person using two identifiers.  Patient Location: Home  Provider Location: Office/Clinic  I discussed the limitations of evaluation and management by telemedicine. The patient expressed understanding and agreed to proceed.   Subjective:   Salar Molden is a 81 y.o. male who presents for Medicare Annual/Subsequent preventive examination.  Review of Systems     Cardiac Risk Factors include: advanced age (>82men, >20 women);male gender;dyslipidemia;hypertension     Objective:    Today's Vitals   03/17/22 0929  Weight: 209 lb (94.8 kg)   Body mass index is 29.99 kg/m.     03/17/2022    9:34 AM 09/10/2020   10:22 AM 01/09/2020    6:48 AM 01/08/2020   11:53 AM 05/31/2019    9:28 AM 04/24/2019    1:49 PM 04/21/2019    2:14 PM  Advanced Directives  Does Patient Have a Medical Advance Directive? Yes Yes Yes Yes Yes Yes Yes  Type of Paramedic of [place];Living will [place];Living will [place];Living will Healthcare Power of [place];Living will [place];Living will [place];Living will  Does patient want to make changes to medical advance directive?  No - Patient declined  No - Patient declined  No - Patient declined No - Patient declined  Copy of [place] in Chart? No - copy requested No - copy requested No - copy requested No - copy requested  No - copy requested No - copy requested    Current Medications (verified) Outpatient Encounter Medications as of 03/17/2022  Medication Sig   amLODipine (NORVASC) 5 MG tablet TAKE 1 TABLET BY MOUTH  DAILY   atenolol (TENORMIN) 50 MG tablet TAKE 1 TABLET BY MOUTH TWICE  DAILY   atorvastatin (LIPITOR) 10 MG tablet Take 1 tablet (10 mg total) by  mouth daily.   budesonide-formoterol (SYMBICORT) 160-4.5 MCG/ACT inhaler Inhale 2 puffs into the lungs in the morning and at bedtime.   clobetasol (TEMOVATE) 0.05 % external solution Apply 1 application topically 2 (two) times daily. For seborrheic dermatitis- 7 days maximum (Patient taking differently: Apply 1 application  topically 2 (two) times daily as needed (scalp itch).)   levocetirizine (XYZAL) 5 MG tablet Take 5 mg by mouth as needed.   Multiple Vitamin (MULTIVITAMIN WITH MINERALS) TABS tablet Take 1 tablet by mouth daily.   ondansetron (ZOFRAN ODT) 4 MG disintegrating tablet Take 1 tablet (4 mg total) by mouth every 8 (eight) hours as needed for nausea or vomiting.   triamcinolone cream (KENALOG) 0.5 % Apply topically 2 (two) times daily as needed. (Patient taking differently: Apply 1 application  topically 2 (two) times daily as needed (rash).)   XARELTO 20 MG TABS tablet TAKE 1 TABLET BY MOUTH DAILY  WITH SUPPER   No facility-administered encounter medications on file as of 03/17/2022.    Allergies (verified) Hydrocodone   History: Past Medical History:  Diagnosis Date   Cataract    left   COLONIC POLYPS, HX OF 04/05/2007   ONE ADENOMATOUS POLYP AND ONE HYPERPLASTIC POLYP   DEPRESSION 03/04/2007   DERMATITIS, SEBORRHEIC 07/19/2007   FIBRILLATION, ATRIAL 03/04/2007   Hearing aid worn    B/L   HYPERLIPIDEMIA 03/04/2007   HYPERTENSION 03/04/2007   Kidney stones    Lung cancer ([place]) dx'd 02/2019  non small cell lung ca   Lung mass    mediastinal adenopathy   Past Surgical History:  Procedure Laterality Date   BIOPSY  01/09/2020   Procedure: BIOPSY;  Surgeon: Laurin Coder, MD;  Location: [facility];  Service: Endoscopy;;   BRONCHIAL WASHINGS  01/09/2020   Procedure: BRONCHIAL WASHINGS;  Surgeon: Laurin Coder, MD;  Location: [facility];  Service: Endoscopy;;   CATARACT EXTRACTION W/ INTRAOCULAR LENS IMPLANT     right   COLONOSCOPY     IR IMAGING GUIDED  PORT INSERTION  01/31/2019   RADIOFREQUENCY ABLATION     x2 for fib, on 2nd attempt switched form a flutter ot a fib.    ROTATOR CUFF REPAIR Right    TONSILLECTOMY     VIDEO BRONCHOSCOPY N/A 01/09/2020   Procedure: VIDEO BRONCHOSCOPY WITH FLUORO;  Surgeon: Laurin Coder, MD;  Location: [facility];  Service: Endoscopy;  Laterality: N/A;   VIDEO BRONCHOSCOPY WITH ENDOBRONCHIAL ULTRASOUND Left 12/30/2018   Procedure: VIDEO BRONCHOSCOPY WITH ENDOBRONCHIAL ULTRASOUND WITH FLUOROSCOPY;  Surgeon: Laurin Coder, MD;  Location: [facility];  Service: Thoracic;  Laterality: Left;   WISDOM TOOTH EXTRACTION     Family History  Problem Relation Age of Onset   Osteoporosis Mother    Dementia Mother    Lung cancer Father        former smoker   Colon cancer Neg Hx    Stomach cancer Neg Hx    Esophageal cancer Neg Hx    Rectal cancer Neg Hx    Social History   Socioeconomic History   Marital status: Widowed    Spouse name: Not on file   Number of children: 1   Years of education: Not on file   Highest education level: Not on file  Occupational History   Occupation: retired  Tobacco Use   Smoking status: Former    Packs/day: 1.50    Years: 10.00    Total pack years: 15.00    Types: Cigarettes    Quit date: 07/08/1977    Years since quitting: 44.7   Smokeless tobacco: Never  Vaping Use   Vaping Use: Never used  Substance and Sexual Activity   Alcohol use: Yes    Alcohol/week: 42.0 standard drinks of alcohol    Types: 42 Standard drinks or equivalent per week    Comment: 4 scotch's a day   Drug use: No   Sexual activity: Not on file  Other Topics Concern   Not on file  Social History Narrative   Widower-wife died 44. 1 daughter. 2 grandkids. 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       Hobbies: golf, bridge, computer, sports, woodworking   Social  Determinants of Health   Financial Resource Strain: Low Risk  (03/17/2022)   Overall Financial Resource Strain (CARDIA)    Difficulty of Paying Living Expenses: Not hard at all  Food Insecurity: No Food Insecurity (03/17/2022)   Hunger Vital Sign    Worried About Running Out of Food in the Last Year: Never true    Ran Out of Food in the Last Year: Never true  Transportation Needs: No Transportation Needs (03/17/2022)   PRAPARE - Hydrologist (Medical): No    Lack of Transportation (Non-Medical): No  Physical Activity: Sufficiently Active (03/17/2022)   Exercise Vital Sign  Days of Exercise per Week: 3 days    Minutes of Exercise per Session: 60 min  Stress: No Stress Concern Present (03/17/2022)   [place]    Feeling of Stress : Not at all  Social Connections: Moderately Isolated (03/17/2022)   Social Connection and Isolation Panel [NHANES]    Frequency of Communication with Friends and Family: More than three times a week    Frequency of Social Gatherings with Friends and Family: More than three times a week    Attends Religious Services: Never    Marine scientist or Organizations: Yes    Attends Archivist Meetings: 1 to 4 times per year    Marital Status: Widowed    Tobacco Counseling Counseling given: Not Answered   Clinical Intake:  Pre-visit preparation completed: Yes  Pain : No/denies pain     BMI - recorded: 29.99 Nutritional Status: BMI 25 -29 Overweight Nutritional Risks: None Diabetes: No  How often do you need to have someone help you when you read instructions, pamphlets, or other written materials from your doctor or pharmacy?: 1 - Never  Diabetic?no  Interpreter Needed?: No      Activities of Daily Living    03/17/2022    9:36 AM 03/10/2022    3:57 PM  In your present state of health, do you have any difficulty performing the  following activities:  Hearing? 1 0  Comment wears hearing aids   Vision? 0 0  Difficulty concentrating or making decisions? 0 0  Walking or climbing stairs? 0 0  Dressing or bathing? 0 0  Doing errands, shopping? 0 0  Preparing Food and eating ? N N  Using the Toilet? N N  In the past six months, have you accidently leaked urine? N N  Do you have problems with loss of bowel control? N N  Managing your Medications? N N  Managing your Finances? N N  Housekeeping or managing your Housekeeping? N N    Patient Care Team: [name], MD as PCP - General (Family Medicine) Donato Heinz, MD as PCP - Cardiology (Cardiology) Jule Ser as Consulting Physician (Dentistry) Valrie Hart, RN as Oncology Nurse Navigator [place], Jerilynn Mages, [employer] (Pharmacist)  Indicate any recent Medical Services you may have received from other than [facility] providers in the past year (date may be approximate).     Assessment:   This is a routine wellness examination for Saifan.  Hearing/Vision screen Hearing Screening - Comments:: Has hearing aids  Vision Screening - Comments:: Pt follows up with [facility] for annual eye exams  Dietary issues and exercise activities discussed: Current Exercise Habits: Home exercise routine, Type of exercise: Other - see comments, Time (Minutes): 60, Frequency (Times/Week): 4, Weekly Exercise (Minutes/Week): 240   Goals Addressed             This Visit's Progress    Patient Stated       Stay active and healthy        Depression Screen    03/17/2022    9:32 AM 03/10/2022    9:16 AM 09/11/2021    9:37 AM 10/21/2020   11:21 AM 09/05/2020    8:01 AM 05/02/2020    8:05 AM 02/29/2020    2:40 PM  PHQ 2/9 Scores  PHQ - 2 Score 1 2 4  0 0 2 0  PHQ- 9 Score 1 3 7   0 7  Fall Risk    03/17/2022    9:36 AM 03/10/2022    3:57 PM 03/10/2022    9:16 AM 10/21/2020   11:21 AM 09/05/2020    8:00 AM  Fall Risk   Falls in the past year? 0  0 0 0 0  Number falls in past yr: 0 0 0  0  Injury with Fall? 0 0 0  0  Risk for fall due to : Impaired vision  History of fall(s)    Follow up Falls prevention discussed  Education provided Falls evaluation completed     [place]:  Any stairs in or around the home? Yes  If so, are there any without handrails? No  Home free of loose throw rugs in walkways, pet beds, electrical cords, etc? Yes  Adequate lighting in your home to reduce risk of falls? Yes   ASSISTIVE DEVICES UTILIZED TO PREVENT FALLS:  Life alert? No  Use of a cane, walker or w/c? No  Grab bars in the bathroom? Yes  Shower chair or bench in shower? No  Elevated toilet seat or a handicapped toilet? No   TIMED UP AND GO:  Was the test performed? No .   Cognitive Function:        03/17/2022    9:37 AM  6CIT Screen  What Year? 0 points  What month? 0 points  What time? 0 points  Count back from 20 0 points  Months in reverse 0 points  Repeat phrase 0 points  Total Score 0 points    Immunizations Immunization History  Administered Date(s) Administered   Fluad Quad(high Dose 65+) 01/16/2019, 12/23/2021   Influenza Nasal 01/07/2020   Influenza Split 12/22/2011   Influenza Whole 01/18/2008, 01/04/2009   Influenza, High Dose Seasonal PF 01/02/2016, 01/21/2017, 02/10/2018   Influenza,inj,Quad PF,6+ Mos 12/23/2012, 02/20/2014, 12/21/2014   Influenza-Unspecified 01/07/2021   Moderna Sars-Covid-2 Vaccination 04/29/2019, 05/27/2019, 01/31/2020, 07/17/2020, 01/07/2021   PFIZER Comirnaty(Gray Top)Covid-19 Tri-Sucrose Vaccine 12/23/2021   PFIZER(Purple Top)SARS-COV-2 Vaccination 08/02/2021   PNEUMOCOCCAL CONJUGATE-20 08/02/2021   Pneumococcal Conjugate-13 06/21/2014   Pneumococcal Polysaccharide-23 01/18/2008   RSV,unspecified 03/16/2022   Td 11/21/2001, 09/11/2021   Tdap 12/22/2011   Zoster Recombinat (Shingrix) 02/03/2018, 04/07/2018   Zoster, Live 01/18/2008    TDAP  status: Up to date  Flu Vaccine status: Up to date  Pneumococcal vaccine status: Up to date  Covid-19 vaccine status: Completed vaccines  Qualifies for Shingles Vaccine? Yes   Zostavax completed Yes   Shingrix Completed?: Yes  Screening Tests Health Maintenance  Topic Date Due   COVID-19 Vaccine (8 - 2023-24 season) 03/26/2022 (Originally 02/17/2022)   Medicare Annual Wellness (AWV)  03/18/2023   DTaP/Tdap/Td (4 - Td or Tdap) 09/12/2031   Pneumonia Vaccine 21+ Years old  Completed   INFLUENZA VACCINE  Completed   Hepatitis C Screening  Completed   Zoster Vaccines- Shingrix  Completed   HPV VACCINES  Aged Out   COLONOSCOPY (Pts 45-77yrs Insurance coverage will need to be confirmed)  Discontinued    Health Maintenance  There are no preventive care reminders to display for this patient.   Colorectal cancer screening: No longer required.    Additional Screening:  Hepatitis C Screening: Completed 12/20/19  Vision Screening: Recommended annual ophthalmology exams for early detection of glaucoma and other disorders of the eye. Is the patient up to date with their annual eye exam?  Yes  Who is the provider or what is the name of the  office in which the patient attends annual eye exams? [facility]  If pt is not established with a provider, would they like to be referred to a provider to establish care? No .   Dental Screening: Recommended annual dental exams for proper oral hygiene  Community Resource Referral / Chronic Care Management: CRR required this visit?  No   CCM required this visit?  No      Plan:     I have personally reviewed and noted the following in the patient's chart:   Medical and social history Use of alcohol, tobacco or illicit drugs  Current medications and supplements including opioid prescriptions. Patient is not currently taking opioid prescriptions. Functional ability and status Nutritional status Physical activity Advanced  directives List of other physicians Hospitalizations, surgeries, and ER visits in previous 12 months Vitals Screenings to include cognitive, depression, and falls Referrals and appointments  In addition, I have reviewed and discussed with patient certain preventive protocols, quality metrics, and best practice recommendations. A written personalized care plan for preventive services as well as general preventive health recommendations were provided to patient.     Willette Brace, LPN   [id number]   Nurse Notes: none

## 2022-04-06 ENCOUNTER — Other Ambulatory Visit: Payer: Self-pay | Admitting: Family Medicine

## 2022-04-10 DIAGNOSIS — J984 Other disorders of lung: Secondary | ICD-10-CM | POA: Diagnosis not present

## 2022-04-10 DIAGNOSIS — T50905A Adverse effect of unspecified drugs, medicaments and biological substances, initial encounter: Secondary | ICD-10-CM | POA: Diagnosis not present

## 2022-04-10 DIAGNOSIS — J439 Emphysema, unspecified: Secondary | ICD-10-CM | POA: Diagnosis not present

## 2022-05-24 ENCOUNTER — Other Ambulatory Visit: Payer: Self-pay | Admitting: Family Medicine

## 2022-07-10 ENCOUNTER — Ambulatory Visit (HOSPITAL_COMMUNITY)
Admission: RE | Admit: 2022-07-10 | Discharge: 2022-07-10 | Disposition: A | Discharge status: Home / Self Care | Payer: Medicare Other | Source: Ambulatory Visit | Attending: Internal Medicine | Admitting: Internal Medicine

## 2022-07-10 ENCOUNTER — Inpatient Hospital Stay: Payer: Medicare Other | Attending: Internal Medicine

## 2022-07-10 ENCOUNTER — Encounter (HOSPITAL_COMMUNITY): Payer: Self-pay

## 2022-07-10 DIAGNOSIS — C3490 Malignant neoplasm of unspecified part of unspecified bronchus or lung: Secondary | ICD-10-CM | POA: Diagnosis not present

## 2022-07-10 DIAGNOSIS — C3431 Malignant neoplasm of lower lobe, right bronchus or lung: Secondary | ICD-10-CM | POA: Insufficient documentation

## 2022-07-10 DIAGNOSIS — R918 Other nonspecific abnormal finding of lung field: Secondary | ICD-10-CM | POA: Diagnosis not present

## 2022-07-10 DIAGNOSIS — D61818 Other pancytopenia: Secondary | ICD-10-CM | POA: Insufficient documentation

## 2022-07-10 DIAGNOSIS — J439 Emphysema, unspecified: Secondary | ICD-10-CM | POA: Diagnosis not present

## 2022-07-10 LAB — CBC WITH DIFFERENTIAL (CANCER CENTER ONLY)
Abs Immature Granulocytes: 0.02 10*3/uL (ref 0.00–0.07)
Basophils Absolute: 0.1 10*3/uL (ref 0.0–0.1)
Basophils Relative: 1 %
Eosinophils Absolute: 0.2 10*3/uL (ref 0.0–0.5)
Eosinophils Relative: 3 %
HCT: 41.1 % (ref 39.0–52.0)
Hemoglobin: 14.3 g/dL (ref 13.0–17.0)
Immature Granulocytes: 0 %
Lymphocytes Relative: 21 %
Lymphs Abs: 1.6 10*3/uL (ref 0.7–4.0)
MCH: 34 pg (ref 26.0–34.0)
MCHC: 34.8 g/dL (ref 30.0–36.0)
MCV: 97.6 fL (ref 80.0–100.0)
Monocytes Absolute: 0.7 10*3/uL (ref 0.1–1.0)
Monocytes Relative: 10 %
Neutro Abs: 4.8 10*3/uL (ref 1.7–7.7)
Neutrophils Relative %: 65 %
Platelet Count: 141 10*3/uL — ABNORMAL LOW (ref 150–400)
RBC: 4.21 MIL/uL — ABNORMAL LOW (ref 4.22–5.81)
RDW: 12.3 % (ref 11.5–15.5)
WBC Count: 7.3 10*3/uL (ref 4.0–10.5)
nRBC: 0 % (ref 0.0–0.2)

## 2022-07-10 LAB — CMP (CANCER CENTER ONLY)
ALT: 19 U/L (ref 0–44)
AST: 18 U/L (ref 15–41)
Albumin: 4 g/dL (ref 3.5–5.0)
Alkaline Phosphatase: 57 U/L (ref 38–126)
Anion gap: 6 (ref 5–15)
BUN: 13 mg/dL (ref 8–23)
CO2: 24 mmol/L (ref 22–32)
Calcium: 9 mg/dL (ref 8.9–10.3)
Chloride: 107 mmol/L (ref 98–111)
Creatinine: 0.99 mg/dL (ref 0.61–1.24)
GFR, Estimated: >60 mL/min (ref >60)
Glucose, Bld: 108 mg/dL — ABNORMAL HIGH (ref 70–99)
Potassium: 4.5 mmol/L (ref 3.5–5.1)
Sodium: 137 mmol/L (ref 135–145)
Total Bilirubin: 0.7 mg/dL (ref 0.3–1.2)
Total Protein: 6.8 g/dL (ref 6.5–8.1)

## 2022-07-10 MED ORDER — SODIUM CHLORIDE (PF) 0.9 % IJ SOLN
INTRAMUSCULAR | Status: AC
  Filled 2022-07-10: qty 50

## 2022-07-10 MED ORDER — IOHEXOL 300 MG/ML  SOLN
75.0000 mL | Freq: Once | INTRAMUSCULAR | Status: AC | PRN
  Administered 2022-07-10: 75 mL via INTRAVENOUS

## 2022-07-10 MED ORDER — HEPARIN SOD (PORK) LOCK FLUSH 100 UNIT/ML IV SOLN
INTRAVENOUS | Status: AC
  Filled 2022-07-10: qty 5

## 2022-07-10 MED ORDER — HEPARIN SOD (PORK) LOCK FLUSH 100 UNIT/ML IV SOLN
500.0000 [IU] | Freq: Once | INTRAVENOUS | Status: AC
  Administered 2022-07-10: 500 [IU] via INTRAVENOUS

## 2022-07-13 ENCOUNTER — Inpatient Hospital Stay (HOSPITAL_BASED_OUTPATIENT_CLINIC_OR_DEPARTMENT_OTHER): Payer: Medicare Other | Admitting: Internal Medicine

## 2022-07-13 VITALS — BP 106/65 | HR 88 | Temp 98.1°F | Resp 18 | Wt 204.2 lb

## 2022-07-13 DIAGNOSIS — D61818 Other pancytopenia: Secondary | ICD-10-CM | POA: Diagnosis not present

## 2022-07-13 DIAGNOSIS — C3490 Malignant neoplasm of unspecified part of unspecified bronchus or lung: Secondary | ICD-10-CM

## 2022-07-13 DIAGNOSIS — C3431 Malignant neoplasm of lower lobe, right bronchus or lung: Secondary | ICD-10-CM | POA: Diagnosis not present

## 2022-07-13 NOTE — Progress Notes (Signed)
[facility] [phone]   Fax:([phone]  OFFICE PROGRESS NOTE  [name], MD [address] [place] [address]  DIAGNOSIS: Stage IIIb (T3, N2, M0) non-small cell lung cancer, squamous cell carcinoma presented with right lower lobe lung mass in addition to subcarinal lymphadenopathy diagnosed in October 2020.    PRIOR THERAPY:  1) Weekly concurrent chemoradiation with Carboplatin for an AUC of 2 Paclitaxel 45 mg/m2. First dose 01/16/2019. Status post 5 cycles.  Last dose was given February 13, 2019. 2) Consolidation treatment with immunotherapy with Imfinzi 1500 mg IV every 4 weeks.  First dose April 05, 2019.  Status post 5 cycles.  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  CURRENT THERAPY:  Observation.  INTERVAL HISTORY: Martin Bailey 82 y.o. male returns to the clinic today for follow-up visit.  The patient is feeling fine today with no concerning complaints except for occasional tickling in his throat.  He denied having any chest pain, cough or hemoptysis.  He has no nausea, vomiting, diarrhea or constipation.  He has no headache or visual changes.  He denied having any significant weight loss or night sweats.  He is here today for evaluation with repeat CT scan of the chest for restaging of his disease.  MEDICAL HISTORY: Past Medical History:  Diagnosis Date   Cataract    left   COLONIC POLYPS, HX OF 04/05/2007   ONE ADENOMATOUS POLYP AND ONE HYPERPLASTIC POLYP   DEPRESSION 03/04/2007   DERMATITIS, SEBORRHEIC 07/19/2007   FIBRILLATION, ATRIAL 03/04/2007   Hearing aid worn    B/L   HYPERLIPIDEMIA 03/04/2007   HYPERTENSION 03/04/2007   Kidney stones    Lung cancer dx'd 02/2019   non small cell lung ca   Lung mass    mediastinal adenopathy    ALLERGIES:  is allergic to hydrocodone.  MEDICATIONS:  Current Outpatient Medications  Medication Sig Dispense Refill   amLODipine  (NORVASC) 5 MG tablet TAKE 1 TABLET BY MOUTH  DAILY 100 tablet 2   atenolol (TENORMIN) 50 MG tablet TAKE 1 TABLET BY MOUTH TWICE  DAILY 200 tablet 2   atorvastatin (LIPITOR) 10 MG tablet TAKE 1 TABLET BY MOUTH DAILY 100 tablet 2   budesonide-formoterol (SYMBICORT) 160-4.5 MCG/ACT inhaler Inhale 2 puffs into the lungs in the morning and at bedtime. 3 each 3   clobetasol (TEMOVATE) 0.05 % external solution Apply 1 application topically 2 (two) times daily. For seborrheic dermatitis- 7 days maximum (Patient taking differently: Apply 1 application  topically 2 (two) times daily as needed (scalp itch).) 50 mL 0   levocetirizine (XYZAL) 5 MG tablet Take 5 mg by mouth as needed.     Multiple Vitamin (MULTIVITAMIN WITH MINERALS) TABS tablet Take 1 tablet by mouth daily.     ondansetron (ZOFRAN ODT) 4 MG disintegrating tablet Take 1 tablet (4 mg total) by mouth every 8 (eight) hours as needed for nausea or vomiting. 20 tablet 1   triamcinolone cream (KENALOG) 0.5 % Apply topically 2 (two) times daily as needed. (Patient taking differently: Apply 1 application  topically 2 (two) times daily as needed (rash).) 30 g 1   XARELTO 20 MG TABS tablet TAKE 1 TABLET BY MOUTH DAILY  WITH SUPPER 100 tablet 2   No current facility-administered medications for this visit.    SURGICAL HISTORY:  Past Surgical History:  Procedure Laterality Date   BIOPSY  01/09/2020   Procedure: BIOPSY;  Surgeon: Wynona Neat,  Adewale A, MD;  [facility]: [facility];  Service: Endoscopy;;   BRONCHIAL WASHINGS  01/09/2020   Procedure: BRONCHIAL WASHINGS;  Surgeon: Tomma Lightning, MD;  [facility];  Service: Endoscopy;;   CATARACT EXTRACTION W/ INTRAOCULAR LENS IMPLANT     right   COLONOSCOPY     IR IMAGING GUIDED PORT INSERTION  01/31/2019   RADIOFREQUENCY ABLATION     x2 for fib, on 2nd attempt switched form a flutter ot a fib.    ROTATOR CUFF REPAIR Right    TONSILLECTOMY     VIDEO BRONCHOSCOPY N/A 01/09/2020    Procedure: VIDEO BRONCHOSCOPY WITH FLUORO;  Surgeon: Tomma Lightning, MD;  [facility];  Service: Endoscopy;  Laterality: N/A;   VIDEO BRONCHOSCOPY WITH ENDOBRONCHIAL ULTRASOUND Left 12/30/2018   Procedure: VIDEO BRONCHOSCOPY WITH ENDOBRONCHIAL ULTRASOUND WITH FLUOROSCOPY;  Surgeon: Tomma Lightning, MD;  Location[facility];  Service: Thoracic;  Laterality: Left;   WISDOM TOOTH EXTRACTION      REVIEW OF SYSTEMS:  A comprehensive review of systems was negative.   PHYSICAL EXAMINATION: General appearance: alert, cooperative, and no distress Head: Normocephalic, without obvious abnormality, atraumatic Neck: no adenopathy, no JVD, supple, symmetrical, trachea midline, and thyroid not enlarged, symmetric, no tenderness/mass/nodules Lymph nodes: Cervical, supraclavicular, and axillary nodes normal. Resp: clear to auscultation bilaterally Back: symmetric, no curvature. ROM normal. No CVA tenderness. Cardio: regular rate and rhythm, S1, S2 normal, no murmur, click, rub or gallop GI: soft, non-tender; bowel sounds normal; no masses,  no organomegaly Extremities: extremities normal, atraumatic, no cyanosis or edema  ECOG PERFORMANCE STATUS: 1 - Symptomatic but completely ambulatory  Blood pressure 106/65, pulse 88, temperature 98.1 F (36.7 C), temperature source Oral, resp. rate 18, weight 204 lb 3.2 oz (92.6 kg), SpO2 98 %.  LABORATORY DATA: Lab Results  Component Value Date   WBC 7.3 07/10/2022   HGB 14.3 07/10/2022   HCT 41.1 07/10/2022   MCV 97.6 07/10/2022   PLT 141 (L) 07/10/2022      Chemistry      Component Value Date/Time   NA 137 07/10/2022 1046   K 4.5 07/10/2022 1046   CL 107 07/10/2022 1046   CO2 24 07/10/2022 1046   BUN 13 07/10/2022 1046   CREATININE 0.99 07/10/2022 1046      Component Value Date/Time   CALCIUM 9.0 07/10/2022 1046   ALKPHOS 57 07/10/2022 1046   AST 18 07/10/2022 1046   ALT 19 07/10/2022 1046   BILITOT 0.7 07/10/2022 1046        RADIOGRAPHIC STUDIES: No results found.  ASSESSMENT AND PLAN: This is a very pleasant 82 years old white male with a stage IIIb non-small cell lung cancer, squamous cell carcinoma diagnosed in October 2020 status post course of concurrent chemoradiation with 5 cycles of chemotherapy with carboplatin and paclitaxel. The patient tolerated his treatment well except for pancytopenia. The patient had partial response after his treatment. He is currently undergoing consolidation treatment with immunotherapy with Imfinzi 1500 mg IV every 4 weeks status post 5  cycles.   His treatment was discontinued because of the persistent and recurrent immunotherapy mediated pneumonitis.  This was treated with a tapered dose of prednisone and improved. The patient has been in observation since that time and he is feeling fine with no concerning complaints. He had repeat CT scan of the chest performed recently.  The final report is still pending but I personally and independently reviewed the scan images and I do not see any  concerning findings compared to the previous scan but I will wait for the final report for confirmation. I recommended for the patient to continue on observation with repeat CT scan of the chest in 6 months. The patient was advised to call immediately if he has any concerning symptoms in the interval.  The patient voices understanding of current disease status and treatment options and is in agreement with the current care plan.  All questions were answered. The patient knows to call the clinic with any problems, questions or concerns. We can certainly see the patient much sooner if necessary.  Disclaimer: This note was dictated with voice recognition software. Similar sounding words can inadvertently be transcribed and may not be corrected upon review.

## 2022-09-15 ENCOUNTER — Ambulatory Visit (INDEPENDENT_AMBULATORY_CARE_PROVIDER_SITE_OTHER): Payer: Medicare Other | Admitting: Family Medicine

## 2022-09-15 ENCOUNTER — Encounter: Payer: Self-pay | Admitting: Family Medicine

## 2022-09-15 VITALS — BP 116/75 | HR 83 | Temp 97.7°F | Ht 70.0 in | Wt 206.8 lb

## 2022-09-15 DIAGNOSIS — F3342 Major depressive disorder, recurrent, in full remission: Secondary | ICD-10-CM | POA: Diagnosis not present

## 2022-09-15 DIAGNOSIS — F1020 Alcohol dependence, uncomplicated: Secondary | ICD-10-CM | POA: Diagnosis not present

## 2022-09-15 DIAGNOSIS — E785 Hyperlipidemia, unspecified: Secondary | ICD-10-CM

## 2022-09-15 DIAGNOSIS — I4811 Longstanding persistent atrial fibrillation: Secondary | ICD-10-CM

## 2022-09-15 DIAGNOSIS — Z0000 Encounter for general adult medical examination without abnormal findings: Secondary | ICD-10-CM | POA: Diagnosis not present

## 2022-09-15 LAB — LIPID PANEL
Cholesterol: 127 mg/dL (ref 0–200)
HDL: 45.5 mg/dL (ref >39.00)
LDL Cholesterol: 66 mg/dL (ref 0–99)
NonHDL: 81.83
Total CHOL/HDL Ratio: 3
Triglycerides: 81 mg/dL (ref 0.0–149.0)
VLDL: 16.2 mg/dL (ref 0.0–40.0)

## 2022-09-15 LAB — CBC WITH DIFFERENTIAL/PLATELET
Basophils Absolute: 0.1 10*3/uL (ref 0.0–0.1)
Basophils Relative: 0.7 % (ref 0.0–3.0)
Eosinophils Absolute: 0.2 10*3/uL (ref 0.0–0.7)
Eosinophils Relative: 2.1 % (ref 0.0–5.0)
HCT: 42.3 % (ref 39.0–52.0)
Hemoglobin: 14.2 g/dL (ref 13.0–17.0)
Lymphocytes Relative: 17.9 % (ref 12.0–46.0)
Lymphs Abs: 1.3 10*3/uL (ref 0.7–4.0)
MCHC: 33.6 g/dL (ref 30.0–36.0)
MCV: 99.4 fl (ref 78.0–100.0)
Monocytes Absolute: 0.7 10*3/uL (ref 0.1–1.0)
Monocytes Relative: 10.1 % (ref 3.0–12.0)
Neutro Abs: 5 10*3/uL (ref 1.4–7.7)
Neutrophils Relative %: 69.2 % (ref 43.0–77.0)
Platelets: 186 10*3/uL (ref 150.0–400.0)
RBC: 4.26 Mil/uL (ref 4.22–5.81)
RDW: 13.6 % (ref 11.5–15.5)
WBC: 7.2 10*3/uL (ref 4.0–10.5)

## 2022-09-15 LAB — COMPREHENSIVE METABOLIC PANEL
ALT: 19 U/L (ref 0–53)
AST: 19 U/L (ref 0–37)
Albumin: 4 g/dL (ref 3.5–5.2)
Alkaline Phosphatase: 63 U/L (ref 39–117)
BUN: 17 mg/dL (ref 6–23)
CO2: 25 mEq/L (ref 19–32)
Calcium: 8.8 mg/dL (ref 8.4–10.5)
Chloride: 102 mEq/L (ref 96–112)
Creatinine, Ser: 0.98 mg/dL (ref 0.40–1.50)
GFR: 72.9 mL/min (ref >60.00)
Glucose, Bld: 103 mg/dL — ABNORMAL HIGH (ref 70–99)
Potassium: 4.8 mEq/L (ref 3.5–5.1)
Sodium: 137 mEq/L (ref 135–145)
Total Bilirubin: 0.6 mg/dL (ref 0.2–1.2)
Total Protein: 6.8 g/dL (ref 6.0–8.3)

## 2022-09-15 LAB — TSH: TSH: 5.61 u[IU]/mL — ABNORMAL HIGH (ref 0.35–5.50)

## 2022-09-15 MED ORDER — TRIAMCINOLONE ACETONIDE 0.5 % EX CREA: TOPICAL_CREAM | Freq: Two times a day (BID) | CUTANEOUS | 1 refills | Status: DC | PRN

## 2022-09-15 NOTE — Progress Notes (Signed)
Phone: [phone]   Subjective:  Patient presents today for their annual physical. Chief complaint-noted.   See problem oriented charting- ROS- full  review of systems was completed and negative  except for: some fatigue and shortness of breath   The following were reviewed and entered/updated in epic: Past Medical History:  Diagnosis Date   Cataract    left   COLONIC POLYPS, HX OF 04/05/2007   ONE ADENOMATOUS POLYP AND ONE HYPERPLASTIC POLYP   DEPRESSION 03/04/2007   DERMATITIS, SEBORRHEIC 07/19/2007   FIBRILLATION, ATRIAL 03/04/2007   Hearing aid worn    B/L   HYPERLIPIDEMIA 03/04/2007   HYPERTENSION 03/04/2007   Kidney stones    Lung cancer (HCC) dx'd 02/2019   non small cell lung ca   Lung mass    mediastinal adenopathy   Patient Active Problem List   Diagnosis Date Noted   Elevated coronary artery calcium score 05/02/2020    Priority: High   Stage III squamous cell carcinoma of right lung (HCC) 01/06/2019    Priority: High   Alcoholism (HCC) 06/21/2014    Priority: High   Atrial fibrillation (HCC) 03/04/2007    Priority: High   Elevated TSH 09/21/2014    Priority: Medium    Seborrheic dermatitis 07/19/2007    Priority: Medium    Hyperlipidemia 03/04/2007    Priority: Medium    Major depression (HCC) 03/04/2007    Priority: Medium    Essential hypertension 03/04/2007    Priority: Medium    Former smoker 06/21/2014    Priority: Low   Allergic rhinitis 04/06/2014    Priority: Low   Hx of adenomatous colonic polyps 03/05/2014    Priority: Low   Chronic anticoagulation 03/05/2014    Priority: Low   Testosterone deficiency 09/02/2010    Priority: Low   Recurrent major depressive disorder, in full remission (HCC) 09/15/2022   Abnormal findings on diagnostic imaging of lung 12/15/2019   Shortness of breath 12/15/2019   Drug-induced pneumonitis 09/19/2019   Encounter for antineoplastic immunotherapy 03/29/2019   Goals of care, counseling/discussion  01/06/2019   Cough 09/12/2018   Overweight 08/09/2017   Past Surgical History:  Procedure Laterality Date   BIOPSY  01/09/2020   Procedure: BIOPSY;  Surgeon: Tomma Lightning, MD;  Location: [facility];  Service: Endoscopy;;   BRONCHIAL WASHINGS  01/09/2020   Procedure: BRONCHIAL WASHINGS;  Surgeon: Tomma Lightning, MD;  Location: [facility];  Service: Endoscopy;;   CATARACT EXTRACTION W/ INTRAOCULAR LENS IMPLANT     right   COLONOSCOPY     IR IMAGING GUIDED PORT INSERTION  01/31/2019   RADIOFREQUENCY ABLATION     x2 for fib, on 2nd attempt switched form a flutter ot a fib.    ROTATOR CUFF REPAIR Right    TONSILLECTOMY     VIDEO BRONCHOSCOPY N/A 01/09/2020   Procedure: VIDEO BRONCHOSCOPY WITH FLUORO;  Surgeon: Tomma Lightning, MD;  Location: [facility];  Service: Endoscopy;  Laterality: N/A;   VIDEO BRONCHOSCOPY WITH ENDOBRONCHIAL ULTRASOUND Left 12/30/2018   Procedure: VIDEO BRONCHOSCOPY WITH ENDOBRONCHIAL ULTRASOUND WITH FLUOROSCOPY;  Surgeon: Tomma Lightning, MD;  Location: [facility];  Service: Thoracic;  Laterality: Left;   WISDOM TOOTH EXTRACTION      Family History  Problem Relation Age of Onset   Osteoporosis Mother    Dementia Mother    Lung cancer Father        former smoker   Cancer Father    Colon cancer Neg Hx    Stomach  cancer Neg Hx    Esophageal cancer Neg Hx    Rectal cancer Neg Hx     Medications- reviewed and updated Current Outpatient Medications  Medication Sig Dispense Refill   amLODipine (NORVASC) 5 MG tablet TAKE 1 TABLET BY MOUTH  DAILY 100 tablet 2   atenolol (TENORMIN) 50 MG tablet TAKE 1 TABLET BY MOUTH TWICE  DAILY 200 tablet 2   atorvastatin (LIPITOR) 10 MG tablet TAKE 1 TABLET BY MOUTH DAILY 100 tablet 2   clobetasol (TEMOVATE) 0.05 % external solution Apply 1 application topically 2 (two) times daily. For seborrheic dermatitis- 7 days maximum (Patient taking differently: Apply 1 application  topically 2 (two) times daily as needed  (scalp itch).) 50 mL 0   levocetirizine (XYZAL) 5 MG tablet Take 5 mg by mouth as needed.     Multiple Vitamin (MULTIVITAMIN WITH MINERALS) TABS tablet Take 1 tablet by mouth daily.     triamcinolone cream (KENALOG) 0.5 % Apply topically 2 (two) times daily as needed. (Patient taking differently: Apply 1 application  topically 2 (two) times daily as needed (rash).) 30 g 1   XARELTO 20 MG TABS tablet TAKE 1 TABLET BY MOUTH DAILY  WITH SUPPER 100 tablet 2   No current facility-administered medications for this visit.    Allergies-reviewed and updated Allergies  Allergen Reactions   Hydrocodone Other (See Comments)    Lost my appetite, loss of energy     Social History   Social History Narrative   Widower-wife died 66. 1 daughter. 2 grandkids. 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       Hobbies: golf, bridge, computer, sports, woodworking   Objective  Objective:  BP 116/75 Comment: home reading this morning  Pulse 83   Temp 97.7 F (36.5 C)   Ht 5\' 10"  (1.778 m)   Wt 206 lb 12.8 oz (93.8 kg)   SpO2 97%   BMI 29.67 kg/m  Gen: NAD, resting comfortably HEENT: Mucous membranes are moist. Oropharynx normal Neck: no thyromegaly CV: RRR no murmurs rubs or gallops Lungs: CTAB no crackles, wheeze, rhonchi Abdomen: soft/nontender/nondistended/normal bowel sounds. No rebound or guarding.  Ext: no edema Skin: warm, dry Neuro: grossly normal, moves all extremities, PERRLA   Assessment and Plan  82 y.o. male presenting for annual physical.  Health Maintenance counseling: 1. Anticipatory guidance: Patient counseled regarding regular dental exams -q6 months, eye exams - plans to [place],  avoiding smoking and second hand smoke , limiting alcohol to 2 beverages per day - see below, no illicit drugs .   2. Risk factor reduction:  Advised patient of need for  regular exercise and diet rich and fruits and vegetables to reduce risk of heart attack and stroke.  Exercise- 3 days a week for an hour.  Diet/weight management-weight roughly stable from last year.  Wt Readings from Last 3 Encounters:  09/15/22 206 lb 12.8 oz (93.8 kg)  07/13/22 204 lb 3.2 oz (92.6 kg)  03/17/22 209 lb (94.8 kg)  3. Immunizations/screenings/ancillary studies- up to date, COVID shot in fall if desired  Immunization History  Administered Date(s) Administered   Fluad Quad(high Dose 65+) 01/16/2019, 12/23/2021   Influenza Nasal 01/07/2020   Influenza Split 12/22/2011   Influenza Whole 01/18/2008, 01/04/2009   Influenza, High Dose Seasonal PF 01/02/2016, 01/21/2017, 02/10/2018   Influenza,inj,Quad PF,6+ Mos 12/23/2012, 02/20/2014, 12/21/2014  Influenza-Unspecified 01/07/2021   Moderna Sars-Covid-2 Vaccination 04/29/2019, 05/27/2019, 01/31/2020, 07/17/2020, 01/07/2021   PFIZER Comirnaty(Gray Top)Covid-19 Tri-Sucrose Vaccine 12/23/2021   PFIZER(Purple Top)SARS-COV-2 Vaccination 08/02/2021   PNEUMOCOCCAL CONJUGATE-20 08/02/2021   Pneumococcal Conjugate-13 06/21/2014   Pneumococcal Polysaccharide-23 01/18/2008   RSV,unspecified 03/16/2022   Td 11/21/2001, 09/11/2021   Tdap 12/22/2011   Zoster Recombinat (Shingrix) 02/03/2018, 04/07/2018   Zoster, Live 01/18/2008  4. Prostate cancer screening-  past age based screening recommendations and denies urinary changes Lab Results  Component Value Date   PSA 0.44 11/25/2011   PSA 0.38 04/11/2009   PSA 0.38 07/19/2008   5. Colon cancer screening - 04/17/20 with no repeat from [facility] per DrMarland Kitchen Georges Mouse 6. Skin cancer screening- had one visit with derm last year- does not plan on regular follow up. advised regular sunscreen use. Denies worrisome, changing, or new skin lesions.  7. Smoking associated screening (lung cancer screening, AAA screen 65-75, UA)- former smoker- quit 2979- no regular screening 8. STD screening -  not  dating  Status of chronic or acute concerns   #naproxen in the past- advised against refill in light of xarelto in particular due to gastroenterology bleeding risk - he still prefers to use OTC (available over the counter without a prescription) but recommended against  # stage III squamous cell carcinoma of the lungs S:  diagnosed October 2020-status post course of concurrent chemoradiation with 5 cycles of chemotherapy.  Did develop pancytopenia. Just had visit with Dr. Arbutus Ped on 07/13/22 - with chemoradiation- Patient had partial response with initial treatment and underwent consolidation treatment with immunotherapy Imfinzi  x 5 cycles but developed pneumonitis and diffuse itching and ultimately stopped- observation since that tiem - also dealing with pneumonitis possibly related to the imfinzi-patient on Symbicort in past- has weaned off A/P:  status post appropriate treatment for squamous cell carcinoma of lung- scans reassuring and will be continued q6 months- he states still on q6 months schedule- last can mentioned posisble 3 months - pneumonitis stable on Symbicort previously and plan was for wean- last visit at [facility] on 04/10/22- actually released for as needed follow up   #Persistent atrial fibrillation S: Rate controlled with atenolol 50 mg twice daily  Anticoagulated with Xarelto 20mg  A/P: appropriately anticoagulated and rate controlled- continue current medicine  #hypertension S: medication:  atenolol 50 mg twice daily , amlodipine 5 mg- actually taking full tablet 3x a week- drops dose if too low Home readings #s:  116/75 this morning BP Readings from Last 3 Encounters:  09/15/22 110/80  07/13/22 106/65  03/10/22 108/76  A/P:  stable- continue current medicines   #CAD based on ct cardiac scoring 298 in 2021 #hyperlipidemia S: Medication: atorvastatin 10 mg daily- was off medicine short term last year -itching on crestor -no chest pain. Stable shortness of breath - no  major barrier Lab Results  Component Value Date   CHOL 181 09/11/2021   HDL 51.10 09/11/2021   LDLCALC 107 (H) 09/11/2021   LDLDIRECT 62 09/09/2020   TRIG 112.0 09/11/2021   CHOLHDL 4 09/11/2021   A/P: CAD asymptomatic - continue current medications  Lipids above goal but hopefully improved- continue current medications    # Depression-his main desire is for companionship but does not believe this will happen S: Medication:None.  3 prior therapist not good fit Does not want to do medication    09/15/2022    9:21 AM 03/17/2022    9:32 AM 03/10/2022    9:16 AM  Depression screen PHQ  2/9  Decreased Interest 0 0 1  Down, Depressed, Hopeless 1 1 1   PHQ - 2 Score 1 1 2   Altered sleeping 0 0 0  Tired, decreased energy 0 0 1  Change in appetite 0 0 0  Feeling bad or failure about yourself  2 0 0  Trouble concentrating 0 0 0  Moving slowly or fidgety/restless 0 0 0  Suicidal thoughts 0 0 0  PHQ-9 Score 3 1 3   Difficult doing work/chores Not difficult at all  Not difficult at all  A/P: reasonable control- has reached point of acceptance. Still doing bridge.   #Mildly elevated TSH-looks good on last check and has been followed by oncology  at times- we will recheck Lab Results  Component Value Date   TSH 3.60 09/11/2021   Recommended follow up: Return in about 6 months (around 03/17/2023) for followup or sooner if needed.Schedule b4 you leave. Future Appointments  Date Time Provider Department Center  01/08/2023 10:00 AM [facility] None  01/12/2023  3:30 PM Si Gaul, MD [facility] None   Lab/Order associations:NOT fasting   ICD-10-CM   1. Preventative health care  Z00.00     2. Recurrent major depressive disorder, in full remission (HCC)  F33.42     3. Longstanding persistent atrial fibrillation (HCC) Chronic I48.11     4. Alcoholism (HCC) Chronic F10.20     5. Hyperlipidemia, unspecified hyperlipidemia type  E78.5       No orders of the  defined types were placed in this encounter.   Return precautions advised.  Tana Conch, MD

## 2022-09-15 NOTE — Assessment & Plan Note (Signed)
Still doing 3-4 beverages per day- no interest in changing and asks me not to ask about this

## 2022-09-15 NOTE — Patient Instructions (Addendum)
Let us know if you get any COVID vaccines this fall.  Consider one of the options I listed for Eye Movement Desensitization and Reprocessing (EMDR) therapy  Please stop by lab before you go If you have mychart- we will send your results within 3 business days of [place] receiving them.  If you do not have mychart- we will call you about results within 5 business days of [place] receiving them.  *please also note that you will see labs on mychart as soon as they post. I will later go in and write notes on them- will say "notes from Dr. Durene Cal"   Recommended follow up: Return in about 6 months (around 03/17/2023) for followup or sooner if needed.Schedule b4 you leave.

## 2022-11-17 ENCOUNTER — Ambulatory Visit: Payer: Medicare Other | Admitting: Family

## 2022-11-17 ENCOUNTER — Encounter: Payer: Self-pay | Admitting: Family

## 2022-11-17 VITALS — BP 117/59 | HR 63 | Temp 97.8°F | Ht 70.0 in | Wt 207.6 lb

## 2022-11-17 DIAGNOSIS — H6122 Impacted cerumen, left ear: Secondary | ICD-10-CM

## 2022-11-17 NOTE — Progress Notes (Signed)
Patient ID: Martin Bailey, male    DOB: [birth year], 82 y.o.   MRN: [id number]  Chief Complaint  Patient presents with   Cerumen Impaction    Pt c/o wax in bilateral ears, hearing loss in right ear but worse in left. Has tried syringe out wax which did not help.     HPI:      Ear congestion:  Pt c/o wax in bilateral ears, hearing loss in right ear but worse in left. Has tried to use a syringe with mix of vinegar and alcohol in both ears which did not help.  He reports a hx of always having to clear his ears to hear. He has seen ENT in past, reports having a tympanostomy tube placed in one ear, but did not help.    Assessment & Plan:  1. Impacted cerumen of left ear- Right ear canal & TM wnl; Verbal consent received to perform left ear lavage via Hydrogen peroxide/water mix solution. Curette used to remove visible cerumen. Pt tolerated well, complete evacuation of all cerumen obtained. Mild erythema but no bleeding noted in ear canal after procedure.  Subjective:    Outpatient Medications Prior to Visit  Medication Sig Dispense Refill   amLODipine (NORVASC) 5 MG tablet TAKE 1 TABLET BY MOUTH  DAILY 100 tablet 2   atenolol (TENORMIN) 50 MG tablet TAKE 1 TABLET BY MOUTH TWICE  DAILY 200 tablet 2   atorvastatin (LIPITOR) 10 MG tablet TAKE 1 TABLET BY MOUTH DAILY 100 tablet 2   clobetasol (TEMOVATE) 0.05 % external solution Apply 1 application topically 2 (two) times daily. For seborrheic dermatitis- 7 days maximum (Patient taking differently: Apply 1 application  topically 2 (two) times daily as needed (scalp itch).) 50 mL 0   levocetirizine (XYZAL) 5 MG tablet Take 5 mg by mouth as needed.     Multiple Vitamin (MULTIVITAMIN WITH MINERALS) TABS tablet Take 1 tablet by mouth daily.     triamcinolone cream (KENALOG) 0.5 % Apply topically 2 (two) times daily as needed. For rash 30 g 1   XARELTO 20 MG TABS tablet TAKE 1 TABLET BY MOUTH DAILY  WITH SUPPER 100 tablet 2   No  facility-administered medications prior to visit.   Past Medical History:  Diagnosis Date   Cataract    left   COLONIC POLYPS, HX OF 04/05/2007   ONE ADENOMATOUS POLYP AND ONE HYPERPLASTIC POLYP   DEPRESSION 03/04/2007   DERMATITIS, SEBORRHEIC 07/19/2007   FIBRILLATION, ATRIAL 03/04/2007   Hearing aid worn    B/L   HYPERLIPIDEMIA 03/04/2007   HYPERTENSION 03/04/2007   Kidney stones    Lung cancer (HCC) dx'd 02/2019   non small cell lung ca   Lung mass    mediastinal adenopathy   Past Surgical History:  Procedure Laterality Date   BIOPSY  01/09/2020   Procedure: BIOPSY;  Surgeon: Tomma Lightning, MD;  Location: [facility];  Service: Endoscopy;;   BRONCHIAL WASHINGS  01/09/2020   Procedure: BRONCHIAL WASHINGS;  Surgeon: Tomma Lightning, MD;  Location: [facility];  Service: Endoscopy;;   CATARACT EXTRACTION W/ INTRAOCULAR LENS IMPLANT     right   COLONOSCOPY     IR IMAGING GUIDED PORT INSERTION  01/31/2019   RADIOFREQUENCY ABLATION     x2 for fib, on 2nd attempt switched form a flutter ot a fib.    ROTATOR CUFF REPAIR Right    TONSILLECTOMY     VIDEO BRONCHOSCOPY N/A 01/09/2020   Procedure: VIDEO BRONCHOSCOPY WITH  FLUORO;  Surgeon: Tomma Lightning, MD;  Location: [facility];  Service: Endoscopy;  Laterality: N/A;   VIDEO BRONCHOSCOPY WITH ENDOBRONCHIAL ULTRASOUND Left 12/30/2018   Procedure: VIDEO BRONCHOSCOPY WITH ENDOBRONCHIAL ULTRASOUND WITH FLUOROSCOPY;  Surgeon: Tomma Lightning, MD;  Location: [facility];  Service: Thoracic;  Laterality: Left;   WISDOM TOOTH EXTRACTION     Allergies  Allergen Reactions   Hydrocodone Other (See Comments)    Lost my appetite, loss of energy       Objective:    Physical Exam Vitals and nursing note reviewed.  Constitutional:      General: He is not in acute distress.    Appearance: Normal appearance.  HENT:     Head: Normocephalic.     Right Ear: Tympanic membrane and ear canal normal.     Left Ear: Tympanic membrane  normal. There is impacted cerumen (able to clear after lavage, ear canal wnl).  Cardiovascular:     Rate and Rhythm: Normal rate and regular rhythm.  Pulmonary:     Effort: Pulmonary effort is normal.     Breath sounds: Normal breath sounds.  Musculoskeletal:        General: Normal range of motion.     Cervical back: Normal range of motion.  Skin:    General: Skin is warm and dry.  Neurological:     Mental Status: He is alert and oriented to person, place, and time.  Psychiatric:        Mood and Affect: Mood normal.    BP (!) 117/59 (BP Location: Left Arm, Patient Position: Sitting, Cuff Size: Large)   Pulse 63   Temp 97.8 F (36.6 C) (Temporal)   Ht 5\' 10"  (1.778 m)   Wt 207 lb 9.6 oz (94.2 kg)   SpO2 100%   BMI 29.79 kg/m  Wt Readings from Last 3 Encounters:  11/17/22 207 lb 9.6 oz (94.2 kg)  09/15/22 206 lb 12.8 oz (93.8 kg)  07/13/22 204 lb 3.2 oz (92.6 kg)       Dulce Sellar, NP

## 2022-11-26 ENCOUNTER — Ambulatory Visit (INDEPENDENT_AMBULATORY_CARE_PROVIDER_SITE_OTHER): Payer: Medicare Other | Admitting: Family Medicine

## 2022-11-26 ENCOUNTER — Encounter: Payer: Self-pay | Admitting: Family Medicine

## 2022-11-26 VITALS — BP 110/74 | HR 60 | Temp 97.9°F | Resp 18 | Ht 70.0 in | Wt 208.4 lb

## 2022-11-26 DIAGNOSIS — H60502 Unspecified acute noninfective otitis externa, left ear: Secondary | ICD-10-CM

## 2022-11-26 MED ORDER — NEOMYCIN-POLYMYXIN-HC 3.5-10000-1 OT SUSP: 4.0000 [drp] | Freq: Three times a day (TID) | OTIC | 0 refills | Status: DC

## 2022-11-26 NOTE — Progress Notes (Signed)
   Subjective:    Patient ID: Martin Bailey, male    DOB: 1940-06-16, 82 y.o.   MRN: [id number]  HPI L ear pain- pt reports chronic problems w/ excessive wax.  Pt had ear irrigated last week.  Now feels like he has swimmers ear.  No drainage.  Had a hearing evaluation this morning and was told to see MD regarding L ear.  No fevers.   Review of Systems For ROS see HPI     Objective:   Physical Exam Vitals reviewed.  Constitutional:      Appearance: Normal appearance.  HENT:     Head: Normocephalic and atraumatic.     Right Ear: Tympanic membrane, ear canal and external ear normal.     Left Ear: Drainage, swelling and tenderness present.  Neurological:     Mental Status: He is alert.           Assessment & Plan:  L otitis externa- new.  Unclear if this is due to recent irrigation or his long standing issue w/ keeping canals clear.  Will start Cortisporin Otic, he is to keep ear clean and dry, and will f/u as needed.  Pt expressed understanding and is in agreement w/ plan.

## 2022-11-26 NOTE — Patient Instructions (Signed)
Follow up as needed or as scheduled START the ear drops as directed Keep ear clean and dry Wear ear plugs if showering or swimming Call with any questions or concerns Hang in there!

## 2022-12-08 DIAGNOSIS — M25561 Pain in right knee: Secondary | ICD-10-CM | POA: Diagnosis not present

## 2022-12-09 DIAGNOSIS — H353132 Nonexudative age-related macular degeneration, bilateral, intermediate dry stage: Secondary | ICD-10-CM | POA: Diagnosis not present

## 2022-12-09 DIAGNOSIS — Z961 Presence of intraocular lens: Secondary | ICD-10-CM | POA: Diagnosis not present

## 2022-12-09 DIAGNOSIS — H43813 Vitreous degeneration, bilateral: Secondary | ICD-10-CM | POA: Diagnosis not present

## 2023-01-08 ENCOUNTER — Ambulatory Visit (HOSPITAL_COMMUNITY)
Admission: RE | Admit: 2023-01-08 | Discharge: 2023-01-08 | Disposition: A | Discharge status: Home / Self Care | Payer: Medicare Other | Source: Ambulatory Visit | Attending: Internal Medicine | Admitting: Internal Medicine

## 2023-01-08 ENCOUNTER — Inpatient Hospital Stay: Payer: Medicare Other | Attending: Internal Medicine

## 2023-01-08 DIAGNOSIS — C3490 Malignant neoplasm of unspecified part of unspecified bronchus or lung: Secondary | ICD-10-CM | POA: Diagnosis not present

## 2023-01-08 DIAGNOSIS — D61818 Other pancytopenia: Secondary | ICD-10-CM | POA: Insufficient documentation

## 2023-01-08 DIAGNOSIS — Z9221 Personal history of antineoplastic chemotherapy: Secondary | ICD-10-CM | POA: Insufficient documentation

## 2023-01-08 DIAGNOSIS — J90 Pleural effusion, not elsewhere classified: Secondary | ICD-10-CM | POA: Diagnosis not present

## 2023-01-08 DIAGNOSIS — C3431 Malignant neoplasm of lower lobe, right bronchus or lung: Secondary | ICD-10-CM | POA: Insufficient documentation

## 2023-01-08 DIAGNOSIS — Z923 Personal history of irradiation: Secondary | ICD-10-CM | POA: Insufficient documentation

## 2023-01-08 LAB — CBC WITH DIFFERENTIAL (CANCER CENTER ONLY)
Abs Immature Granulocytes: 0.03 10*3/uL (ref 0.00–0.07)
Basophils Absolute: 0.1 10*3/uL (ref 0.0–0.1)
Basophils Relative: 1 %
Eosinophils Absolute: 0.2 10*3/uL (ref 0.0–0.5)
Eosinophils Relative: 3 %
HCT: 39.9 % (ref 39.0–52.0)
Hemoglobin: 13.6 g/dL (ref 13.0–17.0)
Immature Granulocytes: 0 %
Lymphocytes Relative: 22 %
Lymphs Abs: 1.5 10*3/uL (ref 0.7–4.0)
MCH: 33.3 pg (ref 26.0–34.0)
MCHC: 34.1 g/dL (ref 30.0–36.0)
MCV: 97.6 fL (ref 80.0–100.0)
Monocytes Absolute: 0.7 10*3/uL (ref 0.1–1.0)
Monocytes Relative: 10 %
Neutro Abs: 4.3 10*3/uL (ref 1.7–7.7)
Neutrophils Relative %: 64 %
Platelet Count: 151 10*3/uL (ref 150–400)
RBC: 4.09 MIL/uL — ABNORMAL LOW (ref 4.22–5.81)
RDW: 12.7 % (ref 11.5–15.5)
WBC Count: 6.8 10*3/uL (ref 4.0–10.5)
nRBC: 0 % (ref 0.0–0.2)

## 2023-01-08 LAB — CMP (CANCER CENTER ONLY)
ALT: 18 U/L (ref 0–44)
AST: 20 U/L (ref 15–41)
Albumin: 4 g/dL (ref 3.5–5.0)
Alkaline Phosphatase: 60 U/L (ref 38–126)
Anion gap: 6 (ref 5–15)
BUN: 14 mg/dL (ref 8–23)
CO2: 25 mmol/L (ref 22–32)
Calcium: 8.8 mg/dL — ABNORMAL LOW (ref 8.9–10.3)
Chloride: 105 mmol/L (ref 98–111)
Creatinine: 0.96 mg/dL (ref 0.61–1.24)
GFR, Estimated: >60 mL/min (ref >60)
Glucose, Bld: 120 mg/dL — ABNORMAL HIGH (ref 70–99)
Potassium: 4.4 mmol/L (ref 3.5–5.1)
Sodium: 136 mmol/L (ref 135–145)
Total Bilirubin: 0.7 mg/dL (ref 0.3–1.2)
Total Protein: 6.7 g/dL (ref 6.5–8.1)

## 2023-01-08 MED ORDER — IOHEXOL 300 MG/ML  SOLN
100.0000 mL | Freq: Once | INTRAMUSCULAR | Status: AC | PRN
  Administered 2023-01-08: 100 mL via INTRAVENOUS

## 2023-01-08 MED ORDER — SODIUM CHLORIDE (PF) 0.9 % IJ SOLN
INTRAMUSCULAR | Status: AC
  Filled 2023-01-08: qty 50

## 2023-01-08 MED ORDER — HEPARIN SOD (PORK) LOCK FLUSH 100 UNIT/ML IV SOLN
500.0000 [IU] | Freq: Once | INTRAVENOUS | Status: AC
  Administered 2023-01-08: 500 [IU] via INTRAVENOUS

## 2023-01-12 ENCOUNTER — Inpatient Hospital Stay: Payer: Medicare Other | Admitting: Internal Medicine

## 2023-01-12 VITALS — BP 136/83 | HR 77 | Temp 97.7°F | Resp 17 | Ht 70.0 in | Wt 210.2 lb

## 2023-01-12 DIAGNOSIS — C3431 Malignant neoplasm of lower lobe, right bronchus or lung: Secondary | ICD-10-CM | POA: Diagnosis not present

## 2023-01-12 DIAGNOSIS — C3490 Malignant neoplasm of unspecified part of unspecified bronchus or lung: Secondary | ICD-10-CM | POA: Diagnosis not present

## 2023-01-12 DIAGNOSIS — Z923 Personal history of irradiation: Secondary | ICD-10-CM | POA: Diagnosis not present

## 2023-01-12 DIAGNOSIS — M25561 Pain in right knee: Secondary | ICD-10-CM | POA: Diagnosis not present

## 2023-01-12 DIAGNOSIS — D61818 Other pancytopenia: Secondary | ICD-10-CM | POA: Diagnosis not present

## 2023-01-12 DIAGNOSIS — Z9221 Personal history of antineoplastic chemotherapy: Secondary | ICD-10-CM | POA: Diagnosis not present

## 2023-01-12 NOTE — Progress Notes (Signed)
[facility] [phone]   [phone]  OFFICE PROGRESS NOTE  [name], MD [address] [place] [address]  DIAGNOSIS: Stage IIIb (T3, N2, M0) non-small cell lung cancer, squamous cell carcinoma presented with right lower lobe lung mass in addition to subcarinal lymphadenopathy diagnosed in October 2020.    PRIOR THERAPY:  1) Weekly concurrent chemoradiation with Carboplatin for an AUC of 2 Paclitaxel 45 mg/m2. First dose 01/16/2019. Status post 5 cycles.  Last dose was given February 13, 2019. 2) Consolidation treatment with immunotherapy with Imfinzi 1500 mg IV every 4 weeks.  First dose April 05, 2019.  Status post 5 cycles.  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  CURRENT THERAPY:  Observation.  INTERVAL HISTORY: Martin Bailey 82 y.o. male returns to the clinic today for 65-month follow-up visit.Discussed the use of AI scribe software for clinical note transcription with the patient, who gave verbal consent to proceed.  History of Present Illness   Martin Bailey, an 82 year old patient, was diagnosed with stage 3B non-small cell lung cancer, specifically squamous cell carcinoma, in October 2020. The patient underwent chemotherapy and radiation treatment, followed by consolidation treatment with Imfinzi immunotherapy. The patient completed five cycles of this therapy, which was discontinued in May 2021 due to immunotherapy mediated pneumonitis. Since then, the patient has been under observation.  In the six months since the last consultation, the patient reports no new health issues or complaints. Specifically, there have been no instances of chest pain, shortness of breath, cough, hemoptysis, nausea, vomiting, diarrhea, or weight loss. The patient also denies any headaches or changes in vision.  The final report is pending.  The patient's physical examination was unremarkable, with no  reported abdominal pain or leg swelling.       MEDICAL HISTORY: Past Medical History:  Diagnosis Date   Cataract    left   COLONIC POLYPS, HX OF 04/05/2007   ONE ADENOMATOUS POLYP AND ONE HYPERPLASTIC POLYP   DEPRESSION 03/04/2007   DERMATITIS, SEBORRHEIC 07/19/2007   FIBRILLATION, ATRIAL 03/04/2007   Hearing aid worn    B/L   HYPERLIPIDEMIA 03/04/2007   HYPERTENSION 03/04/2007   Kidney stones    Lung cancer (HCC) dx'd 02/2019   non small cell lung ca   Lung mass    mediastinal adenopathy    ALLERGIES:  is allergic to hydrocodone.  MEDICATIONS:  Current Outpatient Medications  Medication Sig Dispense Refill   amLODipine (NORVASC) 5 MG tablet TAKE 1 TABLET BY MOUTH  DAILY 100 tablet 2   atenolol (TENORMIN) 50 MG tablet TAKE 1 TABLET BY MOUTH TWICE  DAILY 200 tablet 2   atorvastatin (LIPITOR) 10 MG tablet TAKE 1 TABLET BY MOUTH DAILY 100 tablet 2   clobetasol (TEMOVATE) 0.05 % external solution Apply 1 application topically 2 (two) times daily. For seborrheic dermatitis- 7 days maximum (Patient taking differently: Apply 1 application  topically 2 (two) times daily as needed (scalp itch).) 50 mL 0   levocetirizine (XYZAL) 5 MG tablet Take 5 mg by mouth as needed.     Multiple Vitamin (MULTIVITAMIN WITH MINERALS) TABS tablet Take 1 tablet by mouth daily.     neomycin-polymyxin-hydrocortisone (CORTISPORIN) 3.5-10000-1 OTIC suspension Place 4 drops into the left ear 3 (three) times daily. For up to 10 days 10 mL 0   triamcinolone cream (KENALOG) 0.5 % Apply topically 2 (two) times daily as needed. For rash 30 g 1   XARELTO  20 MG TABS tablet TAKE 1 TABLET BY MOUTH DAILY  WITH SUPPER 100 tablet 2   No current facility-administered medications for this visit.    SURGICAL HISTORY:  Past Surgical History:  Procedure Laterality Date   BIOPSY  01/09/2020   Procedure: BIOPSY;  Surgeon: Tomma Lightning, MD;  Location: [facility];  Service: Endoscopy;;   BRONCHIAL WASHINGS  01/09/2020    Procedure: BRONCHIAL WASHINGS;  Surgeon: Tomma Lightning, MD;  Location: [facility];  Service: Endoscopy;;   CATARACT EXTRACTION W/ INTRAOCULAR LENS IMPLANT     right   COLONOSCOPY     IR IMAGING GUIDED PORT INSERTION  01/31/2019   RADIOFREQUENCY ABLATION     x2 for fib, on 2nd attempt switched form a flutter ot a fib.    ROTATOR CUFF REPAIR Right    TONSILLECTOMY     VIDEO BRONCHOSCOPY N/A 01/09/2020   Procedure: VIDEO BRONCHOSCOPY WITH FLUORO;  Surgeon: Tomma Lightning, MD;  Location: [facility];  Service: Endoscopy;  Laterality: N/A;   VIDEO BRONCHOSCOPY WITH ENDOBRONCHIAL ULTRASOUND Left 12/30/2018   Procedure: VIDEO BRONCHOSCOPY WITH ENDOBRONCHIAL ULTRASOUND WITH FLUOROSCOPY;  Surgeon: Tomma Lightning, MD;  Location: [facility];  Service: Thoracic;  Laterality: Left;   WISDOM TOOTH EXTRACTION      REVIEW OF SYSTEMS:  A comprehensive review of systems was negative.   PHYSICAL EXAMINATION: General appearance: alert, cooperative, and no distress Head: Normocephalic, without obvious abnormality, atraumatic Neck: no adenopathy, no JVD, supple, symmetrical, trachea midline, and thyroid not enlarged, symmetric, no tenderness/mass/nodules Lymph nodes: Cervical, supraclavicular, and axillary nodes normal. Resp: clear to auscultation bilaterally Back: symmetric, no curvature. ROM normal. No CVA tenderness. Cardio: regular rate and rhythm, S1, S2 normal, no murmur, click, rub or gallop GI: soft, non-tender; bowel sounds normal; no masses,  no organomegaly Extremities: extremities normal, atraumatic, no cyanosis or edema  ECOG PERFORMANCE STATUS: 1 - Symptomatic but completely ambulatory  Blood pressure 136/83, pulse 77, temperature 97.7 F (36.5 C), temperature source Oral, resp. rate 17, height 5\' 10"  (1.778 m), weight 210 lb 3.2 oz (95.3 kg), SpO2 99%.  LABORATORY DATA: Lab Results  Component Value Date   WBC 6.8 01/08/2023   HGB 13.6 01/08/2023   HCT 39.9 01/08/2023   MCV  97.6 01/08/2023   PLT 151 01/08/2023      Chemistry      Component Value Date/Time   NA 136 01/08/2023 1054   K 4.4 01/08/2023 1054   CL 105 01/08/2023 1054   CO2 25 01/08/2023 1054   BUN 14 01/08/2023 1054   CREATININE 0.96 01/08/2023 1054      Component Value Date/Time   CALCIUM 8.8 (L) 01/08/2023 1054   ALKPHOS 60 01/08/2023 1054   AST 20 01/08/2023 1054   ALT 18 01/08/2023 1054   BILITOT 0.7 01/08/2023 1054       RADIOGRAPHIC STUDIES: No results found.  ASSESSMENT AND PLAN: This is a very pleasant 82 years old white male with a stage IIIb non-small cell lung cancer, squamous cell carcinoma diagnosed in October 2020 status post course of concurrent chemoradiation with 5 cycles of chemotherapy with carboplatin and paclitaxel. The patient tolerated his treatment well except for pancytopenia. The patient had partial response after his treatment. He underwent consolidation treatment with immunotherapy with Imfinzi 1500 mg IV every 4 weeks status post 5  cycles.   His treatment was discontinued because of the persistent and recurrent immunotherapy mediated pneumonitis.  This was treated with a tapered dose of prednisone  and improved.  He had repeat CT scan of the chest performed recently.  The final report is still pending but I personally and independently reviewed the scan images and I do not see any evidence for disease progression but I will wait for the final report for confirmation.    Stage 3B Non-Small Cell Lung Cancer (Squamous Cell Carcinoma) Diagnosed in October 2020, treated with chemo, radiation, and consolidation treatment with Imfinzi immune therapy (5 cycles). Treatment discontinued in May 2021 due to immunotherapy mediated pneumonitis. No new symptoms reported. Recent scan shows no significant changes, but awaiting final report. -Continue observation. -If pending scan shows any concerns, patient will be contacted. -Schedule follow-up in 6 months with a chest scan  10 days prior to the appointment.   The patient was advised to call immediately if he has any other concerning symptoms in the interval. The patient voices understanding of current disease status and treatment options and is in agreement with the current care plan.  All questions were answered. The patient knows to call the clinic with any problems, questions or concerns. We can certainly see the patient much sooner if necessary.  Disclaimer: This note was dictated with voice recognition software. Similar sounding words can inadvertently be transcribed and may not be corrected upon review.

## 2023-02-10 ENCOUNTER — Encounter: Payer: Self-pay | Admitting: Psychiatry

## 2023-02-17 ENCOUNTER — Encounter: Payer: Self-pay | Admitting: Internal Medicine

## 2023-02-17 NOTE — Telephone Encounter (Signed)
Telephone call  

## 2023-02-18 ENCOUNTER — Ambulatory Visit: Payer: Medicare Other | Admitting: Family

## 2023-02-18 ENCOUNTER — Encounter: Payer: Self-pay | Admitting: Family

## 2023-02-18 ENCOUNTER — Ambulatory Visit
Admission: RE | Admit: 2023-02-18 | Discharge: 2023-02-18 | Disposition: A | Discharge status: Home / Self Care | Payer: Medicare Other | Source: Ambulatory Visit | Attending: Family | Admitting: Family

## 2023-02-18 VITALS — BP 103/68 | HR 73 | Temp 98.0°F | Ht 70.0 in | Wt 208.0 lb

## 2023-02-18 DIAGNOSIS — M898X1 Other specified disorders of bone, shoulder: Secondary | ICD-10-CM | POA: Diagnosis not present

## 2023-02-18 DIAGNOSIS — M19012 Primary osteoarthritis, left shoulder: Secondary | ICD-10-CM | POA: Diagnosis not present

## 2023-02-18 DIAGNOSIS — S2242XA Multiple fractures of ribs, left side, initial encounter for closed fracture: Secondary | ICD-10-CM | POA: Diagnosis not present

## 2023-02-18 MED ORDER — CYCLOBENZAPRINE HCL 5 MG PO TABS: 5.0000 mg | ORAL_TABLET | Freq: Three times a day (TID) | ORAL | 0 refills | Status: DC | PRN

## 2023-02-18 NOTE — Progress Notes (Signed)
Patient ID: Martin Bailey, male    DOB: [birth year], 82 y.o.   MRN: [id number]  Chief Complaint  Patient presents with   Back Pain    Pt c/o left upper back pain after a fall on 11/19. Has tried ibuprofen and aleve which did not help sx.    Discussed the use of AI scribe software for clinical note transcription with the patient, who gave verbal consent to proceed.  History of Present Illness   The patient, with a history of falls, presents with left shoulder and back pain following a recent fall. The patient was attempting a task alone, lost balance, and fell hard on the left side. The patient reports severe pain in the left shoulder, describing it as if all the muscles are bruised or strained. The patient denies noticing any bruising and does not believe anything is broken, but suspects possible rib bruising due to pain experienced when coughing. The patient has been unable to sleep in his bed due to the pain and has been sleeping in a recliner for the past two nights. The patient has tried over-the-counter pain relievers, but reports he has not been effective in managing the pain.    Assessment & Plan:     Fall with Left Shoulder Pain - Patient fell two days ago, landing on left shoulder. Pain is localized to the left shoulder blade area, with some difficulty in arm movement. Mild posterior rib pain w/palpation. No visible bruising or deformity. No difficulty in breathing, but pain on coughing. No history of similar injuries. -Order X-ray of left shoulder to rule out fracture. -Advise patient to apply ice to the area three times a day for 20-31min.  for next 24 hours to reduce inflammation. -Prescribed Cyclobenzaprine (Flexeril) 10mg  at bedtime and 5mg  during the day as needed for pain. -Advised patient to continue taking over-the-counter Tylenol as needed for pain.  Follow-up Await X-ray results and adjust treatment plan accordingly.     Subjective:    Outpatient Medications Prior  to Visit  Medication Sig Dispense Refill   amLODipine (NORVASC) 5 MG tablet TAKE 1 TABLET BY MOUTH  DAILY 100 tablet 2   atenolol (TENORMIN) 50 MG tablet TAKE 1 TABLET BY MOUTH TWICE  DAILY 200 tablet 2   atorvastatin (LIPITOR) 10 MG tablet TAKE 1 TABLET BY MOUTH DAILY 100 tablet 2   clobetasol (TEMOVATE) 0.05 % external solution Apply 1 application topically 2 (two) times daily. For seborrheic dermatitis- 7 days maximum (Patient taking differently: Apply 1 application  topically 2 (two) times daily as needed (scalp itch).) 50 mL 0   Multiple Vitamin (MULTIVITAMIN WITH MINERALS) TABS tablet Take 1 tablet by mouth daily.     triamcinolone cream (KENALOG) 0.5 % Apply topically 2 (two) times daily as needed. For rash 30 g 1   XARELTO 20 MG TABS tablet TAKE 1 TABLET BY MOUTH DAILY  WITH SUPPER 100 tablet 2   levocetirizine (XYZAL) 5 MG tablet Take 5 mg by mouth as needed.     neomycin-polymyxin-hydrocortisone (CORTISPORIN) 3.5-10000-1 OTIC suspension Place 4 drops into the left ear 3 (three) times daily. For up to 10 days 10 mL 0   No facility-administered medications prior to visit.   Past Medical History:  Diagnosis Date   Cataract    left   COLONIC POLYPS, HX OF 04/05/2007   ONE ADENOMATOUS POLYP AND ONE HYPERPLASTIC POLYP   DEPRESSION 03/04/2007   DERMATITIS, SEBORRHEIC 07/19/2007   FIBRILLATION, ATRIAL 03/04/2007   Hearing  aid worn    B/L   HYPERLIPIDEMIA 03/04/2007   HYPERTENSION 03/04/2007   Kidney stones    Lung cancer (HCC) dx'd 02/2019   non small cell lung ca   Lung mass    mediastinal adenopathy   Past Surgical History:  Procedure Laterality Date   BIOPSY  01/09/2020   Procedure: BIOPSY;  Surgeon: Tomma Lightning, MD;  [facility]: [facility];  Service: Endoscopy;;   BRONCHIAL WASHINGS  01/09/2020   Procedure: BRONCHIAL WASHINGS;  Surgeon: Tomma Lightning, MD;  [facility]: [facility];  Service: Endoscopy;;   CATARACT EXTRACTION W/ INTRAOCULAR LENS IMPLANT     right    COLONOSCOPY     IR IMAGING GUIDED PORT INSERTION  01/31/2019   RADIOFREQUENCY ABLATION     x2 for fib, on 2nd attempt switched form a flutter ot a fib.    ROTATOR CUFF REPAIR Right    TONSILLECTOMY     VIDEO BRONCHOSCOPY N/A 01/09/2020   Procedure: VIDEO BRONCHOSCOPY WITH FLUORO;  Surgeon: Tomma Lightning, MD;  Location: [facility];  Service: Endoscopy;  Laterality: N/A;   VIDEO BRONCHOSCOPY WITH ENDOBRONCHIAL ULTRASOUND Left 12/30/2018   Procedure: VIDEO BRONCHOSCOPY WITH ENDOBRONCHIAL ULTRASOUND WITH FLUOROSCOPY;  Surgeon: Tomma Lightning, MD;  Location: [facility];  Service: Thoracic;  Laterality: Left;   WISDOM TOOTH EXTRACTION     Allergies  Allergen Reactions   Hydrocodone Other (See Comments)    Lost my appetite, loss of energy       Objective:    Physical Exam Vitals and nursing note reviewed.  Constitutional:      General: He is not in acute distress.    Appearance: Normal appearance.  HENT:     Head: Normocephalic.  Cardiovascular:     Rate and Rhythm: Normal rate and regular rhythm.  Pulmonary:     Effort: Pulmonary effort is normal.     Breath sounds: Normal breath sounds.  Musculoskeletal:        General: Normal range of motion.     Cervical back: Normal range of motion.     Thoracic back: Swelling (distal shoulder blade), tenderness and bony tenderness (at distal shoulder blade; mild tenderness w/upper posterior ribs) present.  Skin:    General: Skin is warm and dry.  Neurological:     Mental Status: He is alert and oriented to person, place, and time.  Psychiatric:        Mood and Affect: Mood normal.    BP 103/68 (BP Location: Left Arm, Patient Position: Sitting, Cuff Size: Large)   Pulse 73   Temp 98 F (36.7 C) (Temporal)   Ht 5\' 10"  (1.778 m)   Wt 208 lb (94.3 kg)   SpO2 99%   BMI 29.84 kg/m  Wt Readings from Last 3 Encounters:  02/18/23 208 lb (94.3 kg)  01/12/23 210 lb 3.2 oz (95.3 kg)  11/26/22 208 lb 6 oz (94.5 kg)      Dulce Sellar, NP

## 2023-02-19 DIAGNOSIS — M25561 Pain in right knee: Secondary | ICD-10-CM | POA: Diagnosis not present

## 2023-02-22 ENCOUNTER — Other Ambulatory Visit: Payer: Self-pay | Admitting: Family Medicine

## 2023-02-22 DIAGNOSIS — M25561 Pain in right knee: Secondary | ICD-10-CM | POA: Diagnosis not present

## 2023-03-04 ENCOUNTER — Other Ambulatory Visit: Payer: Self-pay | Admitting: Family Medicine

## 2023-03-09 DIAGNOSIS — M25561 Pain in right knee: Secondary | ICD-10-CM | POA: Diagnosis not present

## 2023-03-16 ENCOUNTER — Ambulatory Visit: Payer: Medicare Other | Admitting: Family Medicine

## 2023-03-16 DIAGNOSIS — D225 Melanocytic nevi of trunk: Secondary | ICD-10-CM | POA: Diagnosis not present

## 2023-03-16 DIAGNOSIS — L570 Actinic keratosis: Secondary | ICD-10-CM | POA: Diagnosis not present

## 2023-03-16 DIAGNOSIS — L821 Other seborrheic keratosis: Secondary | ICD-10-CM | POA: Diagnosis not present

## 2023-03-16 DIAGNOSIS — L578 Other skin changes due to chronic exposure to nonionizing radiation: Secondary | ICD-10-CM | POA: Diagnosis not present

## 2023-03-18 ENCOUNTER — Encounter: Payer: Self-pay | Admitting: Family Medicine

## 2023-03-18 ENCOUNTER — Ambulatory Visit: Payer: Medicare Other | Admitting: Family Medicine

## 2023-03-18 VITALS — BP 108/60 | HR 82 | Temp 97.0°F | Ht 70.0 in | Wt 208.4 lb

## 2023-03-18 DIAGNOSIS — E785 Hyperlipidemia, unspecified: Secondary | ICD-10-CM | POA: Diagnosis not present

## 2023-03-18 DIAGNOSIS — F3341 Major depressive disorder, recurrent, in partial remission: Secondary | ICD-10-CM

## 2023-03-18 DIAGNOSIS — I4811 Longstanding persistent atrial fibrillation: Secondary | ICD-10-CM

## 2023-03-18 DIAGNOSIS — I10 Essential (primary) hypertension: Secondary | ICD-10-CM

## 2023-03-18 NOTE — Patient Instructions (Addendum)
  Please call  [facility] if you have not heard within 2 weeks [address] [place], [place] [address]  [phone]  Labs next visit, no changes today other than hoping finding a good fit on therapist  Recommended follow up: Return in about 6 months (around 09/16/2023) for physical or sooner if needed.Schedule b4 you leave.

## 2023-03-18 NOTE — Progress Notes (Signed)
Phone [phone] In person visit   Subjective:   Martin Bailey is a 82 y.o. year old very pleasant male patient who presents for/with See problem oriented charting Chief Complaint  Patient presents with   Medical Management of Chronic Issues   Hypertension   Hyperlipidemia   Past Medical History-  Patient Active Problem List   Diagnosis Date Noted   Elevated coronary artery calcium score 05/02/2020    Priority: High   Stage III squamous cell carcinoma of right lung (HCC) 01/06/2019    Priority: High   Alcoholism (HCC) 06/21/2014    Priority: High   Atrial fibrillation (HCC) 03/04/2007    Priority: High   Elevated TSH 09/21/2014    Priority: Medium    Seborrheic dermatitis 07/19/2007    Priority: Medium    Hyperlipidemia 03/04/2007    Priority: Medium    Major depression (HCC) 03/04/2007    Priority: Medium    Essential hypertension 03/04/2007    Priority: Medium    Former smoker 06/21/2014    Priority: Low   Allergic rhinitis 04/06/2014    Priority: Low   Hx of adenomatous colonic polyps 03/05/2014    Priority: Low   Chronic anticoagulation 03/05/2014    Priority: Low   Testosterone deficiency 09/02/2010    Priority: Low   Recurrent major depressive disorder, in full remission (HCC) 09/15/2022   Abnormal findings on diagnostic imaging of lung 12/15/2019   Shortness of breath 12/15/2019   Drug-induced pneumonitis 09/19/2019   Encounter for antineoplastic immunotherapy 03/29/2019   Goals of care, counseling/discussion 01/06/2019   Cough 09/12/2018   Overweight 08/09/2017    Medications- reviewed and updated Current Outpatient Medications  Medication Sig Dispense Refill   amLODipine (NORVASC) 5 MG tablet TAKE 1 TABLET BY MOUTH DAILY 100 tablet 2   atenolol (TENORMIN) 50 MG tablet TAKE 1 TABLET BY MOUTH TWICE  DAILY 200 tablet 2   atorvastatin (LIPITOR) 10 MG tablet TAKE 1 TABLET BY MOUTH DAILY 100 tablet 2   clobetasol (TEMOVATE) 0.05 % external  solution Apply 1 application topically 2 (two) times daily. For seborrheic dermatitis- 7 days maximum (Patient taking differently: Apply 1 application  topically 2 (two) times daily as needed (scalp itch).) 50 mL 0   Multiple Vitamin (MULTIVITAMIN WITH MINERALS) TABS tablet Take 1 tablet by mouth daily.     XARELTO 20 MG TABS tablet TAKE 1 TABLET BY MOUTH DAILY  WITH SUPPER 100 tablet 2   triamcinolone cream (KENALOG) 0.5 % Apply topically 2 (two) times daily as needed. For rash (Patient not taking: Reported on 03/18/2023) 30 g 1   No current facility-administered medications for this visit.     Objective:  BP 108/60   Pulse 82   Temp (!) 97 F (36.1 C)   Ht 5\' 10"  (1.778 m)   Wt 208 lb 6.4 oz (94.5 kg)   SpO2 99%   BMI 29.90 kg/m  Gen: NAD, resting comfortably CV: irregularly irregular  Lungs: CTAB no crackles, wheeze, rhonchi Ext: no edema Skin: warm, dry     Assessment and Plan   # Rib fractures (after fall with heavy box and had to move differentlythan he would like due to meniscal issues) saw Acie Fredrickson 02/18/2023-she had had a fall 2 days prior landing on the left shoulder and was having shoulder pain but also rib pain with palpation -shoulder is doing better, ribs are slowly improving but still painful - at least not as painful with coughing  #Meniscal injury- Dr.  Everardo Pacific working on this- likely in new year.  - some shortness of breath with stairs- would need to see Dr. Bjorn Pippin again - last seen 2021 if needs surgery for cardiac clearance  # stage III squamous cell carcinoma of the lungs S:  diagnosed October 2020-status post course of concurrent chemoradiation with 5 cycles of chemotherapy.  Did develop pancytopenia.  - with chemoradiation- Patient had partial response with initial treatment and underwent consolidation treatment with immunotherapy Imfinzi  x 5 cycles A/P: doing well- scans q6 months   #Persistent atrial fibrillation S: Rate controlled with  atenolol 50 mg twice daily  Anticoagulated with Xarelto 20mg  A/P: appropriately anticoagulated and rate controlled- continue current medicine  #hypertension S: medication:  atenolol 50 mg twice daily , amlodipine 5 mg daily lately- drops dose if too low A/P:  stable- continue current medicines   #CAD based on ct cardiac scoring 298 in 2021 #hyperlipidemia S: Medication:atorvastatin 10 mg daily -itching on crestor -no chest pain but stable shortness of breath - thinks some related to deconditioning  Lab Results  Component Value Date   CHOL 127 09/15/2022   HDL 45.50 09/15/2022   LDLCALC 66 09/15/2022   LDLDIRECT 62 09/09/2020   TRIG 81.0 09/15/2022   CHOLHDL 3 09/15/2022   A/P: coronary artery disease largely asymptomatic- some shortness of breath but thinks deconditioning or prior lung cancer related- continue current medications for now - lipids at goal   # Depression-his main desire is for companionship but does not believe this will happen S: Medication:None.  - Medications not beneficial in the past.  Has seen psychiatry.  -Also has done counseling and did not find this particularly effective in past but open to trying again -Low testosterone previously treated and did not help with depression either -Activities   -(declines senior center- no good fits, discussed possible [place] [facility])  --does enjoy bridge 4x a week    11/26/2022    2:39 PM 09/15/2022    9:21 AM 03/17/2022    9:32 AM  Depression screen PHQ 2/9  Decreased Interest 0 0 0  Down, Depressed, Hopeless 1 1 1   PHQ - 2 Score 1 1 1   Altered sleeping 0 0 0  Tired, decreased energy 1 0 0  Change in appetite 0 0 0  Feeling bad or failure about yourself  1 2 0  Trouble concentrating 0 0 0  Moving slowly or fidgety/restless 0 0 0  Suicidal thoughts 0 0 0  PHQ-9 Score 3 3 1   Difficult doing work/chores Not difficult at all Not difficult at all   A/P: PHQ9 scores up to 8 from 3 last visit- still rates  not difficult at all but wed like for him to feel better- wants to hold off on repeat trial psychiatry, psychology, medication trial for now but we did discuss possible Eye Movement Desensitization and Reprocessing (EMDR) or brain spotting- wonder If may be helpful- we placed referral to  [facility] who lists Eye Movement Desensitization and Reprocessing (EMDR) and his insurance [address] [place], [place] [address]  [phone]  #Mildly elevated TSH-looks good on last check and has been followed by oncology  or [place]- check TSH today - he prefers to do next visit Lab Results  Component Value Date   TSH 5.61 (H) 09/15/2022   Recommended follow up: Return in about 6 months (around 09/16/2023) for physical or sooner if needed.Schedule b4 you leave. Future Appointments  Date Time Provider Department  Center  07/13/2023 10:00 AM [facility] None  07/20/2023  2:00 PM Si Gaul, MD [facility] None    Lab/Order associations:   ICD-10-CM   1. Longstanding persistent atrial fibrillation (HCC)  I48.11     2. Essential hypertension  I10     3. Hyperlipidemia, unspecified hyperlipidemia type  E78.5     4. Recurrent major depressive disorder, in partial remission (HCC)  F33.41 Ambulatory referral to Psychology    CANCELED: Ambulatory referral to Psychology      No orders of the defined types were placed in this encounter.   Return precautions advised.  Tana Conch, MD

## 2023-03-19 ENCOUNTER — Other Ambulatory Visit: Payer: Self-pay | Admitting: Family Medicine

## 2023-04-13 ENCOUNTER — Telehealth: Payer: Self-pay | Admitting: Family Medicine

## 2023-04-13 DIAGNOSIS — M25562 Pain in left knee: Secondary | ICD-10-CM | POA: Diagnosis not present

## 2023-04-13 DIAGNOSIS — M25561 Pain in right knee: Secondary | ICD-10-CM | POA: Diagnosis not present

## 2023-04-13 NOTE — Telephone Encounter (Signed)
 Dr Katrinka the patient wanted me to give you a heads up that [facility] will be contacting you to get clearance for his upcoming partial knee replacement.    I told him I would pass this on to you.  Thank you,  Darice FORBES [facility] AW[facility] TEAM Direct Dial [phone]

## 2023-04-13 NOTE — Telephone Encounter (Signed)
 Thank you-I will be on the look out

## 2023-04-20 ENCOUNTER — Other Ambulatory Visit: Payer: Self-pay

## 2023-04-20 ENCOUNTER — Telehealth: Payer: Self-pay | Admitting: Family Medicine

## 2023-04-20 DIAGNOSIS — R0602 Shortness of breath: Secondary | ICD-10-CM

## 2023-04-20 NOTE — Telephone Encounter (Signed)
Called and spoke with [name] and made aware clearance from has been faxed to her and referral to Cardiology has been placed.

## 2023-04-20 NOTE — Telephone Encounter (Unsigned)
Copied from CRM [id number]. Topic: Referral - Status >> Apr 20, 2023  9:31 AM Kathryne Eriksson wrote: Reason for CRM: Referral Status >> Apr 20, 2023  9:35 AM Kathryne Eriksson wrote: [name]" is calling on behalf of the patient's referral status. Wanting to know has the referral been sent over and if so, she needs to know who patient will be seeing so that she can send over a clearance. Call back number [phone] "secured line" if no answer, it is okay to leave detailed voicemail with requested information.

## 2023-05-11 ENCOUNTER — Encounter: Payer: Self-pay | Admitting: Cardiology

## 2023-05-11 ENCOUNTER — Ambulatory Visit: Payer: Medicare Other | Attending: Cardiology | Admitting: Cardiology

## 2023-05-11 VITALS — BP 126/84 | HR 60 | Ht 70.0 in | Wt 212.0 lb

## 2023-05-11 DIAGNOSIS — Z7901 Long term (current) use of anticoagulants: Secondary | ICD-10-CM

## 2023-05-11 DIAGNOSIS — I4821 Permanent atrial fibrillation: Secondary | ICD-10-CM | POA: Diagnosis not present

## 2023-05-11 DIAGNOSIS — R0602 Shortness of breath: Secondary | ICD-10-CM

## 2023-05-11 DIAGNOSIS — R931 Abnormal findings on diagnostic imaging of heart and coronary circulation: Secondary | ICD-10-CM

## 2023-05-11 DIAGNOSIS — Z87891 Personal history of nicotine dependence: Secondary | ICD-10-CM

## 2023-05-11 DIAGNOSIS — I10 Essential (primary) hypertension: Secondary | ICD-10-CM | POA: Diagnosis not present

## 2023-05-11 NOTE — Progress Notes (Unsigned)
Cardiology Consultation:    Date:  05/11/2023   ID:  Paula Libra [place], [place] 1941/01/26, MRN [id number]  PCP:  [name], MD  Cardiologist:  Gypsy Balsam, MD   Referring MD: [name], MD   Chief Complaint  Patient presents with   Clearance TBD    [facility]-    History of Present Illness:    Martin Bailey is a 83 y.o. male who is being seen today for the evaluation of cardiovascular preop evaluation at the request of [name], MD. past medical history significant for permanent atrial fibrillation, history of paroxysmal atrial flutter, status post atrial flutter ablation done twice, he is anticoagulant Xarelto, chronic lung disease followed by pulmonary, dyslipidemia on statin, history of ATH abuse, he did have calcium score done in 2021 which was 298 which is 50th percentile.  Comes today to my office because he need knee replacement surgery he need to be evaluated before the surgery.  He is doing quite well until September or October he was able to walk on the regular basis until last April or May he was going to gym few times a week and exercising lifting weight walking on the treadmill and doing a lot of things with no difficulties then he started having problem the knee tried to rest some but then return to gym doing exercises but knee got worse eventually seen orthopedic and surgery is contemplated.  Denies have any chest pain tightness squeezing pressure burning chest no cardiac symptoms.  Past Medical History:  Diagnosis Date   Cataract    left   COLONIC POLYPS, HX OF 04/05/2007   ONE ADENOMATOUS POLYP AND ONE HYPERPLASTIC POLYP   DEPRESSION 03/04/2007   DERMATITIS, SEBORRHEIC 07/19/2007   FIBRILLATION, ATRIAL 03/04/2007   Hearing aid worn    B/L   HYPERLIPIDEMIA 03/04/2007   HYPERTENSION 03/04/2007   Kidney stones    Lung cancer (HCC) dx'd 02/2019   non small cell lung ca   Lung mass    mediastinal adenopathy    Past Surgical  History:  Procedure Laterality Date   BIOPSY  01/09/2020   Procedure: BIOPSY;  Surgeon: Tomma Lightning, MD;  Location: [facility];  Service: Endoscopy;;   BRONCHIAL WASHINGS  01/09/2020   Procedure: BRONCHIAL WASHINGS;  Surgeon: Tomma Lightning, MD;  Location: [facility];  Service: Endoscopy;;   CATARACT EXTRACTION W/ INTRAOCULAR LENS IMPLANT     right   COLONOSCOPY     IR IMAGING GUIDED PORT INSERTION  01/31/2019   RADIOFREQUENCY ABLATION     x2 for fib, on 2nd attempt switched form a flutter ot a fib.    ROTATOR CUFF REPAIR Right    TONSILLECTOMY     VIDEO BRONCHOSCOPY N/A 01/09/2020   Procedure: VIDEO BRONCHOSCOPY WITH FLUORO;  Surgeon: Tomma Lightning, MD;  Location: [facility];  Service: Endoscopy;  Laterality: N/A;   VIDEO BRONCHOSCOPY WITH ENDOBRONCHIAL ULTRASOUND Left 12/30/2018   Procedure: VIDEO BRONCHOSCOPY WITH ENDOBRONCHIAL ULTRASOUND WITH FLUOROSCOPY;  Surgeon: Tomma Lightning, MD;  Location: [facility];  Service: Thoracic;  Laterality: Left;   WISDOM TOOTH EXTRACTION      Current Medications: Current Meds  Medication Sig   amLODipine (NORVASC) 5 MG tablet TAKE 1 TABLET BY MOUTH DAILY (Patient taking differently: Take 5 mg by mouth daily.)   atenolol (TENORMIN) 50 MG tablet TAKE 1 TABLET BY MOUTH TWICE  DAILY   atorvastatin (LIPITOR) 10 MG tablet TAKE 1 TABLET BY MOUTH DAILY  clobetasol (TEMOVATE) 0.05 % external solution Apply 1 application topically 2 (two) times daily. For seborrheic dermatitis- 7 days maximum (Patient taking differently: Apply 1 application  topically 2 (two) times daily as needed (scalp itch).)   Multiple Vitamin (MULTIVITAMIN WITH MINERALS) TABS tablet Take 1 tablet by mouth daily.   triamcinolone cream (KENALOG) 0.5 % Apply topically 2 (two) times daily as needed. For rash (Patient taking differently: Apply 1 Application topically 2 (two) times daily as needed. For rash)   XARELTO 20 MG TABS tablet TAKE 1 TABLET BY MOUTH DAILY  WITH  SUPPER (Patient taking differently: Take 20 mg by mouth daily with supper.)     Allergies:   Hydrocodone   Social History   Socioeconomic History   Marital status: Widowed    Spouse name: Not on file   Number of children: 1   Years of education: Not on file   Highest education level: Doctorate  Occupational History   Occupation: retired  Tobacco Use   Smoking status: Former    Current packs/day: 0.00    Average packs/day: 1.5 packs/day for 10.0 years (15.0 ttl pk-yrs)    Types: Cigarettes    Start date: 07/09/1967    Quit date: 07/08/1977    Years since quitting: 45.8   Smokeless tobacco: Never  Vaping Use   Vaping status: Never Used  Substance and Sexual Activity   Alcohol use: Yes    Alcohol/week: 42.0 standard drinks of alcohol    Types: 42 Standard drinks or equivalent per week    Comment: 4 scotch's a day   Drug use: No   Sexual activity: Not on file  Other Topics Concern   Not on file  Social History Narrative   Widower-wife died 7. 1 daughter. 2 grandkids. 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       Hobbies: golf, bridge, computer, sports, woodworking   Social Drivers of Health   Financial Resource Strain: Low Risk  (03/14/2023)   Overall Financial Resource Strain (CARDIA)    Difficulty of Paying Living Expenses: Not hard at all  Food Insecurity: No Food Insecurity (03/14/2023)   Hunger Vital Sign    Worried About Running Out of Food in the Last Year: Never true    Ran Out of Food in the Last Year: Never true  Transportation Needs: No Transportation Needs (03/14/2023)   PRAPARE - Administrator, Civil Service (Medical): No    Lack of Transportation (Non-Medical): No  Physical Activity: Sufficiently Active (03/14/2023)   Exercise Vital Sign    Days of Exercise per Week: 3 days    Minutes of Exercise per Session: 60 min   Stress: No Stress Concern Present (03/14/2023)   [employer] of Occupational Health - Occupational Stress Questionnaire    Feeling of Stress : Not at all  Social Connections: Unknown (03/14/2023)   Social Connection and Isolation Panel [NHANES]    Frequency of Communication with Friends and Family: Once a week    Frequency of Social Gatherings with Friends and Family: Patient declined    Attends Religious Services: Never    Database administrator or Organizations: No    Attends Engineer, structural: Not on file    Marital Status: Widowed     Family History: The patient's family history includes Cancer in his father; Dementia in his mother; Lung  cancer in his father; Osteoporosis in his mother. There is no history of Colon cancer, Stomach cancer, Esophageal cancer, or Rectal cancer. ROS:   Please see the history of present illness.    All 14 point review of systems negative except as described per history of present illness.  EKGs/Labs/Other Studies Reviewed:    The following studies were reviewed today:   EKG:       Recent Labs: 09/15/2022: TSH 5.61 01/08/2023: ALT 18; BUN 14; Creatinine 0.96; Hemoglobin 13.6; Platelet Count 151; Potassium 4.4; Sodium 136  Recent Lipid Panel    Component Value Date/Time   CHOL 127 09/15/2022 1024   CHOL 210 (H) 02/19/2020 0845   TRIG 81.0 09/15/2022 1024   HDL 45.50 09/15/2022 1024   HDL 67 02/19/2020 0845   CHOLHDL 3 09/15/2022 1024   VLDL 16.2 09/15/2022 1024   LDLCALC 66 09/15/2022 1024   LDLCALC 125 (H) 02/19/2020 0845   LDLDIRECT 62 09/09/2020 0000   LDLDIRECT 91.0 02/09/2017 0855    Physical Exam:    VS:  BP 126/84 (BP Location: Right Arm, Patient Position: Sitting)   Pulse 60   Ht 5\' 10"  (1.778 m)   Wt 212 lb (96.2 kg)   SpO2 100%   BMI 30.42 kg/m     Wt Readings from Last 3 Encounters:  05/11/23 212 lb (96.2 kg)  03/18/23 208 lb 6.4 oz (94.5 kg)  02/18/23 208 lb (94.3 kg)     GEN:  Well nourished,  well developed in no acute distress HEENT: Normal NECK: No JVD; No carotid bruits LYMPHATICS: No lymphadenopathy CARDIAC: RRR, no murmurs, no rubs, no gallops RESPIRATORY:  Clear to auscultation without rales, wheezing or rhonchi  ABDOMEN: Soft, non-tender, non-distended MUSCULOSKELETAL:  No edema; No deformity  SKIN: Warm and dry NEUROLOGIC:  Alert and oriented x 3 PSYCHIATRIC:  Normal affect   ASSESSMENT:    1. SOB (shortness of breath)   2. Elevated coronary artery calcium score   3. Permanent atrial fibrillation (HCC)   4. Essential hypertension   5. Chronic anticoagulation   6. Former smoker    PLAN:    In order of problems listed above:  Elevated calcium score.  He is completely asymptomatic from that point review he exercised on the regular basis aggressively until April and was easily able to do 4 METS until October November and December even he was able to walk quite well and getting 4 METS easily now because of knee problem he cannot do that.  I do not think we need to do ischemia evaluation since he is being well-managed right now with perfect control cholesterol and calcium score being relatively low with percentile being only 50. Dyspnea exertion evaluated for it before apparently got some pneumonitis.  Treated with bronchodilators shortness of breath better I will schedule him to have another echocardiogram done to assess left ventricle ejection fraction. Permanent atrial fibrillation.  I think we can actually drop the issue of potential conversion, he has been in atrial fibrillation for a long time does have biatrial enlargement.  He is anticoag with Xarelto. Cardiovascular preop evaluation of his echocardiogram showed preserved ejection fraction which I anticipate to do he will be acceptable candidate for surgery from cardiac standpoint review.  We need to hold his Xarelto 48 hours before procedure and restart as quickly as feasible from orthopedic point of  view. Dyslipidemia I did review K PN which show me LDL of 66 HDL 45 this is from summer of last year  good control   Medication Adjustments/Labs and Tests Ordered: Current medicines are reviewed at length with the patient today.  Concerns regarding medicines are outlined above.  Orders Placed This Encounter  Procedures   EKG 12-Lead   No orders of the defined types were placed in this encounter.   Signed, Georgeanna Lea, MD, [facility]. 05/11/2023 2:05 PM    [facility]

## 2023-05-11 NOTE — Patient Instructions (Signed)
Medication Instructions:  Your physician recommends that you continue on your current medications as directed. Please refer to the Current Medication list given to you today.  *If you need a refill on your cardiac medications before your next appointment, please call your pharmacy*   Lab Work: None Ordered If you have labs (blood work) drawn today and your tests are completely normal, you will receive your results only by: MyChart Message (if you have MyChart) OR A paper copy in the mail If you have any lab test that is abnormal or we need to change your treatment, we will call you to review the results.   Testing/Procedures: Your physician has requested that you have an echocardiogram. Echocardiography is a painless test that uses sound waves to create images of your heart. It provides your doctor with information about the size and shape of your heart and how well your heart's chambers and valves are working. This procedure takes approximately one hour. There are no restrictions for this procedure. Please do NOT wear cologne, perfume, aftershave, or lotions (deodorant is allowed). Please arrive 15 minutes prior to your appointment time.  Please note: We ask at that you not bring children with you during ultrasound (echo/ vascular) testing. Due to room size and safety concerns, children are not allowed in the ultrasound rooms during exams. Our front office staff cannot provide observation of children in our lobby area while testing is being conducted. An adult accompanying a patient to their appointment will only be allowed in the ultrasound room at the discretion of the ultrasound technician under special circumstances. We apologize for any inconvenience.    Follow-Up: At [facility], you and your health needs are our priority.  As part of our continuing mission to provide you with exceptional heart care, we have created designated Provider Care Teams.  These Care Teams include your  primary Cardiologist (physician) and Advanced Practice Providers (APPs -  Physician Assistants and Nurse Practitioners) who all work together to provide you with the care you need, when you need it.  We recommend signing up for the patient portal called "MyChart".  Sign up information is provided on this After Visit Summary.  MyChart is used to connect with patients for Virtual Visits (Telemedicine).  Patients are able to view lab/test results, encounter notes, upcoming appointments, etc.  Non-urgent messages can be sent to your provider as well.   To learn more about what you can do with MyChart, go to [email or web address].    Your next appointment:   12 month(s)  The format for your next appointment:   In Person  Provider:   Gypsy Balsam, MD    Other Instructions NA

## 2023-05-12 ENCOUNTER — Ambulatory Visit: Payer: Medicare Other | Attending: Cardiology

## 2023-05-12 DIAGNOSIS — R0602 Shortness of breath: Secondary | ICD-10-CM

## 2023-05-12 LAB — ECHOCARDIOGRAM COMPLETE
AR max vel: 2.36 cm2
AV Area VTI: 2.41 cm2
AV Area mean vel: 2.42 cm2
AV Mean grad: 3.7 mm[Hg]
AV Peak grad: 6.7 mm[Hg]
Ao pk vel: 1.29 m/s
Area-P 1/2: 4.39 cm2
S' Lateral: 3.4 cm

## 2023-05-14 ENCOUNTER — Telehealth: Payer: Self-pay

## 2023-05-14 NOTE — Telephone Encounter (Signed)
   Patient Name: Martin Bailey  DOB: January 16, 1941 MRN: [id number]  Primary Cardiologist: Little Ishikawa, MD  Chart reviewed as part of pre-operative protocol coverage. Given past medical history and time since last visit, based on ACC/AHA guidelines, Martin Bailey is at acceptable risk for the planned procedure without further cardiovascular testing.   The patient was advised that if he develops new symptoms prior to surgery to contact our office to arrange for a follow-up visit, and he verbalized understanding.  Per Dr. Bing Matter patient can hold Xarelto 48 hours prior to procedure and should restart as quickly and feasible from orthopedic point of view.  I will route this recommendation to the requesting party via Epic fax function and remove from pre-op pool.  Please call with questions.  Napoleon Form, Leodis Rains, NP 05/14/2023, 4:05 PM

## 2023-05-14 NOTE — Telephone Encounter (Signed)
   [facility] Pre-operative Risk Assessment    Request for surgical clearance:  What type of surgery is being performed? Right partial knee arthroplasty     When is this surgery scheduled? TBD   What type of clearance is required (medical clearance vs. Pharmacy clearance to hold med vs. Both)? Both  Are there any medications that need to be held prior to surgery and how long? No   Practice name and name of physician performing surgery? Dr. Weber Cooks with  [facility]   What is your office phone number [phone] ext [phone]    7.   What is your office fax number [phone]  8.   Anesthesia type (None, local, MAC, general) ? Spinal    [place] C Taim Wurm 05/14/2023, 3:54 PM  _________________________________________________________________   (provider comments below)

## 2023-05-25 ENCOUNTER — Telehealth: Payer: Self-pay

## 2023-05-25 NOTE — Telephone Encounter (Signed)
 Left message on My Chart with normal results per Dr. Vanetta Shawl note. Routed to PCP.

## 2023-05-26 ENCOUNTER — Telehealth: Payer: Self-pay

## 2023-05-26 NOTE — Telephone Encounter (Signed)
 Pt viewed Echo results on My Chart per Dr. Vanetta Shawl note. Routed to PCP.

## 2023-05-27 ENCOUNTER — Ambulatory Visit: Payer: Medicare Other | Admitting: Cardiology

## 2023-06-02 ENCOUNTER — Ambulatory Visit: Payer: Self-pay | Admitting: Emergency Medicine

## 2023-06-02 DIAGNOSIS — G8929 Other chronic pain: Secondary | ICD-10-CM

## 2023-06-02 DIAGNOSIS — M25561 Pain in right knee: Secondary | ICD-10-CM | POA: Diagnosis not present

## 2023-06-02 NOTE — H&P (Signed)
 PARTIAL VS TOTAL KNEE ADMISSION H&P  Patient is being admitted for right partial knee arthroplasty.  Subjective:  Chief Complaint:right knee pain.  HPI: Kyan Yurkovich, 83 y.o. male, has a history of pain and functional disability in the right knee due to arthritis and has failed non-surgical conservative treatments for greater than 12 weeks to includeNSAID's and/or analgesics, corticosteriod injections, use of assistive devices, and activity modification.  Onset of symptoms was gradual, starting >10 years ago with gradually worsening course since that time. The patient noted no past surgery on the right knee(s).  Patient currently rates pain in the right knee(s) at a persistent 4-5 out of 10 with activity. Patient has night pain, worsening of pain with activity and weight bearing, pain that interferes with activities of daily living, and pain with passive range of motion.  Patient has evidence of periarticular osteophytes and joint space narrowing mainly in the medial compartment by imaging studies. There is no active infection.  Patient Active Problem List   Diagnosis Date Noted   Recurrent major depressive disorder, in full remission (HCC) 09/15/2022   Elevated coronary artery calcium score 05/02/2020   Abnormal findings on diagnostic imaging of lung 12/15/2019   Drug-induced pneumonitis 09/19/2019   Encounter for antineoplastic immunotherapy 03/29/2019   Stage III squamous cell carcinoma of right lung (HCC) 01/06/2019   Goals of care, counseling/discussion 01/06/2019   Overweight 08/09/2017   Elevated TSH 09/21/2014   Former smoker 06/21/2014   Alcoholism (HCC) 06/21/2014   Allergic rhinitis 04/06/2014   Hx of adenomatous colonic polyps 03/05/2014   Chronic anticoagulation 03/05/2014   Testosterone deficiency 09/02/2010   Seborrheic dermatitis 07/19/2007   Hyperlipidemia 03/04/2007   Major depression (HCC) 03/04/2007   Essential hypertension 03/04/2007   Atrial fibrillation  (HCC) 03/04/2007   Past Medical History:  Diagnosis Date   Cataract    left   COLONIC POLYPS, HX OF 04/05/2007   ONE ADENOMATOUS POLYP AND ONE HYPERPLASTIC POLYP   DEPRESSION 03/04/2007   DERMATITIS, SEBORRHEIC 07/19/2007   FIBRILLATION, ATRIAL 03/04/2007   Hearing aid worn    B/L   HYPERLIPIDEMIA 03/04/2007   HYPERTENSION 03/04/2007   Kidney stones    Lung cancer (HCC) dx'd 02/2019   non small cell lung ca   Lung mass    mediastinal adenopathy    Past Surgical History:  Procedure Laterality Date   BIOPSY  01/09/2020   Procedure: BIOPSY;  Surgeon: Tomma Lightning, MD;  Location: [facility];  Service: Endoscopy;;   BRONCHIAL WASHINGS  01/09/2020   Procedure: BRONCHIAL WASHINGS;  Surgeon: Tomma Lightning, MD;  Location: [facility];  Service: Endoscopy;;   CATARACT EXTRACTION W/ INTRAOCULAR LENS IMPLANT     right   COLONOSCOPY     IR IMAGING GUIDED PORT INSERTION  01/31/2019   RADIOFREQUENCY ABLATION     x2 for fib, on 2nd attempt switched form a flutter ot a fib.    ROTATOR CUFF REPAIR Right    TONSILLECTOMY     VIDEO BRONCHOSCOPY N/A 01/09/2020   Procedure: VIDEO BRONCHOSCOPY WITH FLUORO;  Surgeon: Tomma Lightning, MD;  Location: [facility];  Service: Endoscopy;  Laterality: N/A;   VIDEO BRONCHOSCOPY WITH ENDOBRONCHIAL ULTRASOUND Left 12/30/2018   Procedure: VIDEO BRONCHOSCOPY WITH ENDOBRONCHIAL ULTRASOUND WITH FLUOROSCOPY;  Surgeon: Tomma Lightning, MD;  Location: [facility];  Service: Thoracic;  Laterality: Left;   WISDOM TOOTH EXTRACTION      Current Outpatient Medications  Medication Sig Dispense Refill Last Dose/Taking   amLODipine (NORVASC)  5 MG tablet TAKE 1 TABLET BY MOUTH DAILY (Patient taking differently: Take 5 mg by mouth daily.) 100 tablet 2    atenolol (TENORMIN) 50 MG tablet TAKE 1 TABLET BY MOUTH TWICE  DAILY 200 tablet 2    atorvastatin (LIPITOR) 10 MG tablet TAKE 1 TABLET BY MOUTH DAILY 100 tablet 2    clobetasol (TEMOVATE) 0.05 % external solution  Apply 1 application topically 2 (two) times daily. For seborrheic dermatitis- 7 days maximum (Patient taking differently: Apply 1 application  topically 2 (two) times daily as needed (scalp itch).) 50 mL 0    Multiple Vitamin (MULTIVITAMIN WITH MINERALS) TABS tablet Take 1 tablet by mouth daily.      triamcinolone cream (KENALOG) 0.5 % Apply topically 2 (two) times daily as needed. For rash (Patient taking differently: Apply 1 Application topically 2 (two) times daily as needed. For rash) 30 g 1    XARELTO 20 MG TABS tablet TAKE 1 TABLET BY MOUTH DAILY  WITH SUPPER (Patient taking differently: Take 20 mg by mouth daily with supper.) 100 tablet 2    No current facility-administered medications for this visit.   Allergies  Allergen Reactions   Hydrocodone Other (See Comments)    Lost my appetite, loss of energy     Social History   Tobacco Use   Smoking status: Former    Current packs/day: 0.00    Average packs/day: 1.5 packs/day for 10.0 years (15.0 ttl pk-yrs)    Types: Cigarettes    Start date: 07/09/1967    Quit date: 07/08/1977    Years since quitting: 45.9   Smokeless tobacco: Never  Substance Use Topics   Alcohol use: Yes    Alcohol/week: 42.0 standard drinks of alcohol    Types: 42 Standard drinks or equivalent per week    Comment: 4 scotch's a day    Family History  Problem Relation Age of Onset   Osteoporosis Mother    Dementia Mother    Lung cancer Father        former smoker   Cancer Father    Colon cancer Neg Hx    Stomach cancer Neg Hx    Esophageal cancer Neg Hx    Rectal cancer Neg Hx      Review of Systems  Musculoskeletal:  Positive for arthralgias.  All other systems reviewed and are negative.   Objective:  Physical Exam Constitutional:      General: He is not in acute distress.    Appearance: Normal appearance. He is normal weight.  HENT:     Head: Normocephalic and atraumatic.  Eyes:     Extraocular Movements: Extraocular movements intact.      Conjunctiva/sclera: Conjunctivae normal.     Pupils: Pupils are equal, round, and reactive to light.  Cardiovascular:     Rate and Rhythm: Normal rate and regular rhythm.     Pulses: Normal pulses.     Heart sounds: Normal heart sounds.  Pulmonary:     Effort: Pulmonary effort is normal. No respiratory distress.     Breath sounds: Normal breath sounds.  Abdominal:     General: Bowel sounds are normal. There is no distension.     Palpations: Abdomen is soft.     Tenderness: There is no abdominal tenderness.  Musculoskeletal:        General: Tenderness present.     Cervical back: Normal range of motion and neck supple.     Comments: TTP over medial joint line.  No calf  tenderness, swelling, or erythema.  No overlying lesions of area of chief complaint.  Decreased strength and ROM due to elicited pain.  Pre-operative ROM 0-120.  Dorsiflexion and plantarflexion intact.  Stable to varus and valgus stress.  BLE appear grossly neurovascularly intact.  Gait mildly antalgic.   Lymphadenopathy:     Cervical: No cervical adenopathy.  Skin:    General: Skin is warm and dry.     Capillary Refill: Capillary refill takes less than 2 seconds.  Neurological:     General: No focal deficit present.     Mental Status: He is alert and oriented to person, place, and time.  Psychiatric:        Mood and Affect: Mood normal.        Behavior: Behavior normal.     Vital signs in last 24 hours: @VSRANGES @  Labs:   Estimated body mass index is 30.42 kg/m as calculated from the following:   Height as of 05/11/23: 5\' 10"  (1.778 m).   Weight as of 05/11/23: 96.2 kg.   Imaging Review Plain radiographs demonstrate severe degenerative joint disease of the right knee(s), appearing isolated to the medial compartment. The overall alignment issignificant varus. The bone quality appears to be fair for age and reported activity level.      Assessment/Plan:  End stage arthritis, right knee, medial  compartment  The patient history, physical examination, clinical judgment of the provider and imaging studies are consistent with end stage degenerative joint disease of the right knee(s) and partial knee arthroplasty is deemed medically necessary. The treatment options including medical management, injection therapy arthroscopy and arthroplasty were discussed at length. The risks and benefits of total knee arthroplasty were presented and reviewed. The risks due to aseptic loosening, infection, stiffness, patella tracking problems, thromboembolic complications and other imponderables were discussed. Also discussed the possibility of transitioning from partial to total knee replacement depending on surgical findings.  The patient acknowledged the explanation, agreed to proceed with the plan and consent was signed. Patient is being admitted for inpatient treatment for surgery, pain control, PT, OT, prophylactic antibiotics, VTE prophylaxis, progressive ambulation and ADL's and discharge planning. The patient is planning to be discharged home with outpatient PT.   Anticipated LOS equal to or greater than 2 midnights due to - Age 40 and older with one or more of the following:  - Obesity  - Expected need for hospital services (PT, OT, Nursing) required for safe  discharge  - Anticipated need for postoperative skilled nursing care or inpatient rehab  - Active co-morbidities: chronic atrial fibrillation on long-term anticoagulation, prior lung malignancy, HTN, HLD, depression OR   - Unanticipated findings during/Post Surgery: None  - Patient is a high risk of re-admission due to: None

## 2023-06-02 NOTE — H&P (View-Only) (Signed)
 PARTIAL VS TOTAL KNEE ADMISSION H&P  Patient is being admitted for right partial knee arthroplasty.  Subjective:  Chief Complaint:right knee pain.  HPI: Kyan Yurkovich, 83 y.o. male, has a history of pain and functional disability in the right knee due to arthritis and has failed non-surgical conservative treatments for greater than 12 weeks to includeNSAID's and/or analgesics, corticosteriod injections, use of assistive devices, and activity modification.  Onset of symptoms was gradual, starting >10 years ago with gradually worsening course since that time. The patient noted no past surgery on the right knee(s).  Patient currently rates pain in the right knee(s) at a persistent 4-5 out of 10 with activity. Patient has night pain, worsening of pain with activity and weight bearing, pain that interferes with activities of daily living, and pain with passive range of motion.  Patient has evidence of periarticular osteophytes and joint space narrowing mainly in the medial compartment by imaging studies. There is no active infection.  Patient Active Problem List   Diagnosis Date Noted   Recurrent major depressive disorder, in full remission (HCC) 09/15/2022   Elevated coronary artery calcium score 05/02/2020   Abnormal findings on diagnostic imaging of lung 12/15/2019   Drug-induced pneumonitis 09/19/2019   Encounter for antineoplastic immunotherapy 03/29/2019   Stage III squamous cell carcinoma of right lung (HCC) 01/06/2019   Goals of care, counseling/discussion 01/06/2019   Overweight 08/09/2017   Elevated TSH 09/21/2014   Former smoker 06/21/2014   Alcoholism (HCC) 06/21/2014   Allergic rhinitis 04/06/2014   Hx of adenomatous colonic polyps 03/05/2014   Chronic anticoagulation 03/05/2014   Testosterone deficiency 09/02/2010   Seborrheic dermatitis 07/19/2007   Hyperlipidemia 03/04/2007   Major depression (HCC) 03/04/2007   Essential hypertension 03/04/2007   Atrial fibrillation  (HCC) 03/04/2007   Past Medical History:  Diagnosis Date   Cataract    left   COLONIC POLYPS, HX OF 04/05/2007   ONE ADENOMATOUS POLYP AND ONE HYPERPLASTIC POLYP   DEPRESSION 03/04/2007   DERMATITIS, SEBORRHEIC 07/19/2007   FIBRILLATION, ATRIAL 03/04/2007   Hearing aid worn    B/L   HYPERLIPIDEMIA 03/04/2007   HYPERTENSION 03/04/2007   Kidney stones    Lung cancer (HCC) dx'd 02/2019   non small cell lung ca   Lung mass    mediastinal adenopathy    Past Surgical History:  Procedure Laterality Date   BIOPSY  01/09/2020   Procedure: BIOPSY;  Surgeon: Tomma Lightning, MD;  Location: [facility];  Service: Endoscopy;;   BRONCHIAL WASHINGS  01/09/2020   Procedure: BRONCHIAL WASHINGS;  Surgeon: Tomma Lightning, MD;  Location: [facility];  Service: Endoscopy;;   CATARACT EXTRACTION W/ INTRAOCULAR LENS IMPLANT     right   COLONOSCOPY     IR IMAGING GUIDED PORT INSERTION  01/31/2019   RADIOFREQUENCY ABLATION     x2 for fib, on 2nd attempt switched form a flutter ot a fib.    ROTATOR CUFF REPAIR Right    TONSILLECTOMY     VIDEO BRONCHOSCOPY N/A 01/09/2020   Procedure: VIDEO BRONCHOSCOPY WITH FLUORO;  Surgeon: Tomma Lightning, MD;  Location: [facility];  Service: Endoscopy;  Laterality: N/A;   VIDEO BRONCHOSCOPY WITH ENDOBRONCHIAL ULTRASOUND Left 12/30/2018   Procedure: VIDEO BRONCHOSCOPY WITH ENDOBRONCHIAL ULTRASOUND WITH FLUOROSCOPY;  Surgeon: Tomma Lightning, MD;  Location: [facility];  Service: Thoracic;  Laterality: Left;   WISDOM TOOTH EXTRACTION      Current Outpatient Medications  Medication Sig Dispense Refill Last Dose/Taking   amLODipine (NORVASC)  5 MG tablet TAKE 1 TABLET BY MOUTH DAILY (Patient taking differently: Take 5 mg by mouth daily.) 100 tablet 2    atenolol (TENORMIN) 50 MG tablet TAKE 1 TABLET BY MOUTH TWICE  DAILY 200 tablet 2    atorvastatin (LIPITOR) 10 MG tablet TAKE 1 TABLET BY MOUTH DAILY 100 tablet 2    clobetasol (TEMOVATE) 0.05 % external solution  Apply 1 application topically 2 (two) times daily. For seborrheic dermatitis- 7 days maximum (Patient taking differently: Apply 1 application  topically 2 (two) times daily as needed (scalp itch).) 50 mL 0    Multiple Vitamin (MULTIVITAMIN WITH MINERALS) TABS tablet Take 1 tablet by mouth daily.      triamcinolone cream (KENALOG) 0.5 % Apply topically 2 (two) times daily as needed. For rash (Patient taking differently: Apply 1 Application topically 2 (two) times daily as needed. For rash) 30 g 1    XARELTO 20 MG TABS tablet TAKE 1 TABLET BY MOUTH DAILY  WITH SUPPER (Patient taking differently: Take 20 mg by mouth daily with supper.) 100 tablet 2    No current facility-administered medications for this visit.   Allergies  Allergen Reactions   Hydrocodone Other (See Comments)    Lost my appetite, loss of energy     Social History   Tobacco Use   Smoking status: Former    Current packs/day: 0.00    Average packs/day: 1.5 packs/day for 10.0 years (15.0 ttl pk-yrs)    Types: Cigarettes    Start date: 07/09/1967    Quit date: 07/08/1977    Years since quitting: 45.9   Smokeless tobacco: Never  Substance Use Topics   Alcohol use: Yes    Alcohol/week: 42.0 standard drinks of alcohol    Types: 42 Standard drinks or equivalent per week    Comment: 4 scotch's a day    Family History  Problem Relation Age of Onset   Osteoporosis Mother    Dementia Mother    Lung cancer Father        former smoker   Cancer Father    Colon cancer Neg Hx    Stomach cancer Neg Hx    Esophageal cancer Neg Hx    Rectal cancer Neg Hx      Review of Systems  Musculoskeletal:  Positive for arthralgias.  All other systems reviewed and are negative.   Objective:  Physical Exam Constitutional:      General: He is not in acute distress.    Appearance: Normal appearance. He is normal weight.  HENT:     Head: Normocephalic and atraumatic.  Eyes:     Extraocular Movements: Extraocular movements intact.      Conjunctiva/sclera: Conjunctivae normal.     Pupils: Pupils are equal, round, and reactive to light.  Cardiovascular:     Rate and Rhythm: Normal rate and regular rhythm.     Pulses: Normal pulses.     Heart sounds: Normal heart sounds.  Pulmonary:     Effort: Pulmonary effort is normal. No respiratory distress.     Breath sounds: Normal breath sounds.  Abdominal:     General: Bowel sounds are normal. There is no distension.     Palpations: Abdomen is soft.     Tenderness: There is no abdominal tenderness.  Musculoskeletal:        General: Tenderness present.     Cervical back: Normal range of motion and neck supple.     Comments: TTP over medial joint line.  No calf  tenderness, swelling, or erythema.  No overlying lesions of area of chief complaint.  Decreased strength and ROM due to elicited pain.  Pre-operative ROM 0-120.  Dorsiflexion and plantarflexion intact.  Stable to varus and valgus stress.  BLE appear grossly neurovascularly intact.  Gait mildly antalgic.   Lymphadenopathy:     Cervical: No cervical adenopathy.  Skin:    General: Skin is warm and dry.     Capillary Refill: Capillary refill takes less than 2 seconds.  Neurological:     General: No focal deficit present.     Mental Status: He is alert and oriented to person, place, and time.  Psychiatric:        Mood and Affect: Mood normal.        Behavior: Behavior normal.     Vital signs in last 24 hours: @VSRANGES @  Labs:   Estimated body mass index is 30.42 kg/m as calculated from the following:   Height as of 05/11/23: 5\' 10"  (1.778 m).   Weight as of 05/11/23: 96.2 kg.   Imaging Review Plain radiographs demonstrate severe degenerative joint disease of the right knee(s), appearing isolated to the medial compartment. The overall alignment issignificant varus. The bone quality appears to be fair for age and reported activity level.      Assessment/Plan:  End stage arthritis, right knee, medial  compartment  The patient history, physical examination, clinical judgment of the provider and imaging studies are consistent with end stage degenerative joint disease of the right knee(s) and partial knee arthroplasty is deemed medically necessary. The treatment options including medical management, injection therapy arthroscopy and arthroplasty were discussed at length. The risks and benefits of total knee arthroplasty were presented and reviewed. The risks due to aseptic loosening, infection, stiffness, patella tracking problems, thromboembolic complications and other imponderables were discussed. Also discussed the possibility of transitioning from partial to total knee replacement depending on surgical findings.  The patient acknowledged the explanation, agreed to proceed with the plan and consent was signed. Patient is being admitted for inpatient treatment for surgery, pain control, PT, OT, prophylactic antibiotics, VTE prophylaxis, progressive ambulation and ADL's and discharge planning. The patient is planning to be discharged home with outpatient PT.   Anticipated LOS equal to or greater than 2 midnights due to - Age 40 and older with one or more of the following:  - Obesity  - Expected need for hospital services (PT, OT, Nursing) required for safe  discharge  - Anticipated need for postoperative skilled nursing care or inpatient rehab  - Active co-morbidities: chronic atrial fibrillation on long-term anticoagulation, prior lung malignancy, HTN, HLD, depression OR   - Unanticipated findings during/Post Surgery: None  - Patient is a high risk of re-admission due to: None

## 2023-06-09 NOTE — Care Plan (Signed)
 Ortho Bundle Case Management Note  Patient Details  Name: Martin Bailey MRN: [id number] Date of Birth: 1940-09-20   met with patient in the office for H&P. will discharge to home with family to assist. rolling walker ordered for home. OPPT set up with [facility]. discharge instructions discussed and questions answered. Patient and MD in agreement with plan. Choice offered                     DME Arranged:  Walker rolling DME Agency:  [employer]  HH Arranged:    HH Agency:     Additional Comments: Please contact me with any questions of if this plan should need to change.  Shauna Hugh,  RN,BSN,MHA,CCM  [facility] Orthopaedic Specialist  [phone] 06/09/2023, 3:52 PM

## 2023-06-14 NOTE — Progress Notes (Addendum)
 Anesthesia Review:  PCP: Tana Conch- LVO 03/18/23  Cardiologist : Bing Matter LOV 05/11/23  Clearance Alden Server Dick,NP 05/14/23- telephone visit  PPM/ ICD: Device Orders: Rep Notified:  Chest x-ray :  CT Chest- 01/20/23  EKG : 05/11/23  Echo : 05/12/23  Stress test: 2021  CT Card- 2021  Cardiac Cath :   Activity level: can do a flight of stairs without difficutly  Sleep Study/ CPAP : none  Fasting Blood Sugar :      / Checks Blood Sugar -- times a day:    Blood Thinner/ Instructions /Last Dose: ASA / Instructions/ Last Dose :  Xarelto  Stop 3 days prior to surgery    PT called in on 06/22/23 to ask what was considered a small overnite bag.  PT concerned about staying overnite and does not want to.  Informed pt that the OR posting is not definite .  Informed pt that he could call and speak with Surgery Scheduler or see surgeon in am .  PT voiced understanding.

## 2023-06-14 NOTE — Patient Instructions (Addendum)
 SURGICAL WAITING ROOM VISITATION  Patients having surgery or a procedure may have no more than 2 support people in the waiting area - these visitors may rotate.    Children under the age of 62 must have an adult with them who is not the patient.  Due to an increase in RSV and influenza rates and associated hospitalizations, children ages 63 and under may not visit patients in [facility] hospitals.  Visitors with respiratory illnesses are discouraged from visiting and should remain at home.  If the patient needs to stay at the hospital during part of their recovery, the visitor guidelines for inpatient rooms apply. Pre-op nurse will coordinate an appropriate time for 1 support person to accompany patient in pre-op.  This support person may not rotate.    Please refer to the [facility] website for the visitor guidelines for Inpatients (after your surgery is over and you are in a regular room).       Your procedure is scheduled on:  06/23/23    Report to [facility] Main Entrance    Report to admitting at   0800AM   Call this number if you have problems the morning of surgery [phone]   Do not eat food :After Midnight.   After Midnight you may have the following liquids until _ 0730_____ AM  DAY OF SURGERY  Water Non-Citrus Juices (without pulp, NO RED-Apple, White grape, White cranberry) Black Coffee (NO MILK/CREAM OR CREAMERS, sugar ok)  Clear Tea (NO MILK/CREAM OR CREAMERS, sugar ok) regular and decaf                             Plain Jell-O (NO RED)                                           Fruit ices (not with fruit pulp, NO RED)                                     Popsicles (NO RED)                                                               Sports drinks like Gatorade (NO RED)                    The day of surgery:  Drink ONE (1) Pre-Surgery Clear Ensure or G2 at  0730AM ( have completed by )  the morning of surgery. Drink in one sitting. Do not sip.   This drink was given to you during your hospital  pre-op appointment visit. Nothing else to drink after completing the  Pre-Surgery Clear Ensure or G2.          If you have questions, please contact your surgeon's office.       Oral Hygiene is also important to reduce your risk of infection.  Remember - BRUSH YOUR TEETH THE MORNING OF SURGERY WITH YOUR REGULAR TOOTHPASTE  DENTURES WILL BE REMOVED PRIOR TO SURGERY PLEASE DO NOT APPLY "Poly grip" OR ADHESIVES!!!   Do NOT smoke after Midnight   Stop all vitamins and herbal supplements 7 days before surgery.   Take these medicines the morning of surgery with A SIP OF WATER:    amlodipine, atenolol   DO NOT TAKE ANY ORAL DIABETIC MEDICATIONS DAY OF YOUR SURGERY  Bring CPAP mask and tubing day of surgery.                              You may not have any metal on your body including hair pins, jewelry, and body piercing             Do not wear make-up, lotions, powders, perfumes/cologne, or deodorant  Do not wear nail polish including gel and S&S, artificial/acrylic nails, or any other type of covering on natural nails including finger and toenails. If you have artificial nails, gel coating, etc. that needs to be removed by a nail salon please have this removed prior to surgery or surgery may need to be canceled/ delayed if the surgeon/ anesthesia feels like they are unable to be safely monitored.   Do not shave  48 hours prior to surgery.               Men may shave face and neck.   Do not bring valuables to the hospital. [facility] IS NOT             RESPONSIBLE   FOR VALUABLES.   Contacts, glasses, dentures or bridgework may not be worn into surgery.   Bring small overnight bag day of surgery.   DO NOT BRING YOUR HOME MEDICATIONS TO THE HOSPITAL. PHARMACY WILL DISPENSE MEDICATIONS LISTED ON YOUR MEDICATION LIST TO YOU DURING YOUR ADMISSION IN THE HOSPITAL!    Patients discharged on the  day of surgery will not be allowed to drive home.  Someone NEEDS to stay with you for the first 24 hours after anesthesia.   Special Instructions: Bring a copy of your healthcare power of attorney and living will documents the day of surgery if you haven't scanned them before.              Please read over the following fact sheets you were given: IF YOU HAVE QUESTIONS ABOUT YOUR PRE-OP INSTRUCTIONS PLEASE CALL [phone]   If you received a COVID test during your pre-op visit  it is requested that you wear a mask when out in public, stay away from anyone that may not be feeling well and notify your surgeon if you develop symptoms. If you test positive for Covid or have been in contact with anyone that has tested positive in the last 10 days please notify you surgeon.      Pre-operative 5 CHG Bath Instructions   You can play a key role in reducing the risk of infection after surgery. Your skin needs to be as free of germs as possible. You can reduce the number of germs on your skin by washing with CHG (chlorhexidine gluconate) soap before surgery. CHG is an antiseptic soap that kills germs and continues to kill germs even after washing.   DO NOT use if you have an allergy to chlorhexidine/CHG or antibacterial soaps. If your skin becomes reddened or irritated, stop using the CHG and notify one of  our RNs at [phone].   Please shower with the CHG soap starting 4 days before surgery using the following schedule:     Please keep in mind the following:  DO NOT shave, including legs and underarms, starting the day of your first shower.   You may shave your face at any point before/day of surgery.  Place clean sheets on your bed the day you start using CHG soap. Use a clean washcloth (not used since being washed) for each shower. DO NOT sleep with pets once you start using the CHG.   CHG Shower Instructions:  If you choose to wash your hair and private area, wash first with your normal  shampoo/soap.  After you use shampoo/soap, rinse your hair and body thoroughly to remove shampoo/soap residue.  Turn the water OFF and apply about 3 tablespoons (45 ml) of CHG soap to a CLEAN washcloth.  Apply CHG soap ONLY FROM YOUR NECK DOWN TO YOUR TOES (washing for 3-5 minutes)  DO NOT use CHG soap on face, private areas, open wounds, or sores.  Pay special attention to the area where your surgery is being performed.  If you are having back surgery, having someone wash your back for you may be helpful. Wait 2 minutes after CHG soap is applied, then you may rinse off the CHG soap.  Pat dry with a clean towel  Put on clean clothes/pajamas   If you choose to wear lotion, please use ONLY the CHG-compatible lotions on the back of this paper.     Additional instructions for the day of surgery: DO NOT APPLY any lotions, deodorants, cologne, or perfumes.   Put on clean/comfortable clothes.  Brush your teeth.  Ask your nurse before applying any prescription medications to the skin.      CHG Compatible Lotions   Aveeno Moisturizing lotion  Cetaphil Moisturizing Cream  Cetaphil Moisturizing Lotion  Clairol Herbal Essence Moisturizing Lotion, Dry Skin  Clairol Herbal Essence Moisturizing Lotion, Extra Dry Skin  Clairol Herbal Essence Moisturizing Lotion, Normal Skin  Curel Age Defying Therapeutic Moisturizing Lotion with Alpha Hydroxy  Curel Extreme Care Body Lotion  Curel Soothing Hands Moisturizing Hand Lotion  Curel Therapeutic Moisturizing Cream, Fragrance-Free  Curel Therapeutic Moisturizing Lotion, Fragrance-Free  Curel Therapeutic Moisturizing Lotion, Original Formula  Eucerin Daily Replenishing Lotion  Eucerin Dry Skin Therapy Plus Alpha Hydroxy Crme  Eucerin Dry Skin Therapy Plus Alpha Hydroxy Lotion  Eucerin Original Crme  Eucerin Original Lotion  Eucerin Plus Crme Eucerin Plus Lotion  Eucerin TriLipid Replenishing Lotion  Keri Anti-Bacterial Hand Lotion  Keri Deep  Conditioning Original Lotion Dry Skin Formula Softly Scented  Keri Deep Conditioning Original Lotion, Fragrance Free Sensitive Skin Formula  Keri Lotion Fast Absorbing Fragrance Free Sensitive Skin Formula  Keri Lotion Fast Absorbing Softly Scented Dry Skin Formula  Keri Original Lotion  Keri Skin Renewal Lotion Keri Silky Smooth Lotion  Keri Silky Smooth Sensitive Skin Lotion  Nivea Body Creamy Conditioning Oil  Nivea Body Extra Enriched Teacher, adult education Moisturizing Lotion Nivea Crme  Nivea Skin Firming Lotion  NutraDerm 30 Skin Lotion  NutraDerm Skin Lotion  NutraDerm Therapeutic Skin Cream  NutraDerm Therapeutic Skin Lotion  ProShield Protective Hand Cream  Provon moisturizing lotion

## 2023-06-16 ENCOUNTER — Encounter (HOSPITAL_COMMUNITY)
Admission: RE | Admit: 2023-06-16 | Discharge: 2023-06-16 | Disposition: A | Discharge status: Home / Self Care | Payer: Medicare Other | Source: Ambulatory Visit | Attending: Orthopedic Surgery | Admitting: Orthopedic Surgery

## 2023-06-16 ENCOUNTER — Other Ambulatory Visit: Payer: Self-pay

## 2023-06-16 ENCOUNTER — Encounter (HOSPITAL_COMMUNITY): Payer: Self-pay

## 2023-06-16 VITALS — BP 119/93 | HR 57 | Temp 98.5°F | Resp 16 | Ht 70.0 in | Wt 211.6 lb

## 2023-06-16 DIAGNOSIS — Z87891 Personal history of nicotine dependence: Secondary | ICD-10-CM | POA: Insufficient documentation

## 2023-06-16 DIAGNOSIS — Z01818 Encounter for other preprocedural examination: Secondary | ICD-10-CM

## 2023-06-16 DIAGNOSIS — G8929 Other chronic pain: Secondary | ICD-10-CM | POA: Insufficient documentation

## 2023-06-16 DIAGNOSIS — Z01812 Encounter for preprocedural laboratory examination: Secondary | ICD-10-CM | POA: Diagnosis not present

## 2023-06-16 DIAGNOSIS — M1711 Unilateral primary osteoarthritis, right knee: Secondary | ICD-10-CM | POA: Diagnosis not present

## 2023-06-16 DIAGNOSIS — Z7901 Long term (current) use of anticoagulants: Secondary | ICD-10-CM | POA: Diagnosis not present

## 2023-06-16 DIAGNOSIS — I4891 Unspecified atrial fibrillation: Secondary | ICD-10-CM | POA: Insufficient documentation

## 2023-06-16 DIAGNOSIS — I10 Essential (primary) hypertension: Secondary | ICD-10-CM | POA: Insufficient documentation

## 2023-06-16 HISTORY — DX: Cardiac arrhythmia, unspecified: I49.9

## 2023-06-16 HISTORY — DX: Personal history of urinary calculi: Z87.442

## 2023-06-16 HISTORY — DX: Unspecified osteoarthritis, unspecified site: M19.90

## 2023-06-16 LAB — COMPREHENSIVE METABOLIC PANEL
ALT: 22 U/L (ref 0–44)
AST: 22 U/L (ref 15–41)
Albumin: 3.9 g/dL (ref 3.5–5.0)
Alkaline Phosphatase: 65 U/L (ref 38–126)
Anion gap: 8 (ref 5–15)
BUN: 13 mg/dL (ref 8–23)
CO2: 23 mmol/L (ref 22–32)
Calcium: 8.5 mg/dL — ABNORMAL LOW (ref 8.9–10.3)
Chloride: 101 mmol/L (ref 98–111)
Creatinine, Ser: 0.86 mg/dL (ref 0.61–1.24)
GFR, Estimated: >60 mL/min (ref >60)
Glucose, Bld: 107 mg/dL — ABNORMAL HIGH (ref 70–99)
Potassium: 4.6 mmol/L (ref 3.5–5.1)
Sodium: 132 mmol/L — ABNORMAL LOW (ref 135–145)
Total Bilirubin: 0.7 mg/dL (ref 0.0–1.2)
Total Protein: 7 g/dL (ref 6.5–8.1)

## 2023-06-16 LAB — CBC WITH DIFFERENTIAL/PLATELET
Abs Immature Granulocytes: 0.04 10*3/uL (ref 0.00–0.07)
Basophils Absolute: 0 10*3/uL (ref 0.0–0.1)
Basophils Relative: 1 %
Eosinophils Absolute: 0.2 10*3/uL (ref 0.0–0.5)
Eosinophils Relative: 2 %
HCT: 42.5 % (ref 39.0–52.0)
Hemoglobin: 13.7 g/dL (ref 13.0–17.0)
Immature Granulocytes: 1 %
Lymphocytes Relative: 19 %
Lymphs Abs: 1.5 10*3/uL (ref 0.7–4.0)
MCH: 32.5 pg (ref 26.0–34.0)
MCHC: 32.2 g/dL (ref 30.0–36.0)
MCV: 101 fL — ABNORMAL HIGH (ref 80.0–100.0)
Monocytes Absolute: 0.8 10*3/uL (ref 0.1–1.0)
Monocytes Relative: 10 %
Neutro Abs: 5.1 10*3/uL (ref 1.7–7.7)
Neutrophils Relative %: 67 %
Platelets: 170 10*3/uL (ref 150–400)
RBC: 4.21 MIL/uL — ABNORMAL LOW (ref 4.22–5.81)
RDW: 12.2 % (ref 11.5–15.5)
WBC: 7.6 10*3/uL (ref 4.0–10.5)
nRBC: 0 % (ref 0.0–0.2)

## 2023-06-16 LAB — SURGICAL PCR SCREEN
MRSA, PCR: NEGATIVE
Staphylococcus aureus: POSITIVE — AB

## 2023-06-17 NOTE — Progress Notes (Signed)
 Anesthesia Chart Review   Case: [id number] Date/Time: 06/23/23 1015   Procedure: UNICOMPARTMENTAL KNEE-MEDIAL (Right: Knee)   Anesthesia type: Spinal   Pre-op diagnosis: OA RIGHT KNEE   Location: [facility] RO[facility] 06 / [facility]   Surgeons: Joen Laura, MD       DISCUSSION:83 y.o. former smoker with h/o HTN, atrial fibrillation, right knee OA scheduled for above procedure 06/23/2023 with Dr. Weber Cooks.   Per cardiology note 05/14/2023. Per OV note, "Chart reviewed as part of pre-operative protocol coverage. Given past medical history and time since last visit, based on ACC/AHA guidelines, Martin Bailey is at acceptable risk for the planned procedure without further cardiovascular testing.    The patient was advised that if he develops new symptoms prior to surgery to contact our office to arrange for a follow-up visit, and he verbalized understanding.   Per Dr. Bing Matter patient can hold Xarelto 48 hours prior to procedure and should restart as quickly and feasible from orthopedic point of view."  Discussed with Dr. Henrine Screws, ok to hold 72 hours.  Discussed with patient.  Past dose will be 06/19/23 PM.  VS: BP (!) 119/93   Pulse (!) 57   Temp 36.9 C (Oral)   Resp 16   Ht 5\' 10"  (1.778 m)   Wt 96 kg   SpO2 100%   BMI 30.37 kg/m   PROVIDERS: [name], MD is PCP   Primary Cardiologist: Little Ishikawa, MD  LABS: Labs reviewed: Acceptable for surgery. (all labs ordered are listed, but only abnormal results are displayed)  Labs Reviewed  SURGICAL PCR SCREEN - Abnormal; Notable for the following components:      Result Value   Staphylococcus aureus POSITIVE (*)    All other components within normal limits  CBC WITH DIFFERENTIAL/PLATELET - Abnormal; Notable for the following components:   RBC 4.21 (*)    MCV 101.0 (*)    All other components within normal limits  COMPREHENSIVE METABOLIC PANEL - Abnormal; Notable for the following components:   Sodium  132 (*)    Glucose, Bld 107 (*)    Calcium 8.5 (*)    All other components within normal limits  TYPE AND SCREEN     IMAGES:   EKG:   CV: Echo 05/12/23 1. Left ventricular ejection fraction, by estimation, is 60 to 65%. The  left ventricle has normal function. The left ventricle has no regional  wall motion abnormalities. There is mild left ventricular hypertrophy.  Left ventricular diastolic parameters  are indeterminate. The average left ventricular global longitudinal strain  is -13.3 %. The global longitudinal strain is abnormal.   2. Right ventricular systolic function is normal. The right ventricular  size is normal.   3. Left atrial size was severely dilated.   4. The mitral valve is normal in structure. No evidence of mitral valve  regurgitation. No evidence of mitral stenosis.   5. The aortic valve is normal in structure. Aortic valve regurgitation is  not visualized. No aortic stenosis is present.   6. The inferior vena cava is normal in size with greater than 50%  respiratory variability, suggesting right atrial pressure of 3 mmHg.   Myocardial Perfusion 02/21/2020 Nuclear stress EF: 64%. The left ventricular ejection fraction is normal (55-65%). No T wave inversion was noted during stress. There was no ST segment deviation noted during stress. Defect 1: There is a small defect of mild severity. This is a low risk study.   Small size,  mild intensity fixed apical perfusion defect, likely artifact. No significant reversible ischemia. LVEF 64% with normal wall motion. This is a low risk study. Past Medical History:  Diagnosis Date   Arthritis    Cataract    left   COLONIC POLYPS, HX OF 04/05/2007   ONE ADENOMATOUS POLYP AND ONE HYPERPLASTIC POLYP   DEPRESSION 03/04/2007   DERMATITIS, SEBORRHEIC 07/19/2007   Dysrhythmia    FIBRILLATION, ATRIAL 03/04/2007   Hearing aid worn    B/L   History of kidney stones    HYPERLIPIDEMIA 03/04/2007   HYPERTENSION  03/04/2007   Lung cancer (HCC) dx'd 02/2019   non small cell lung ca   Lung mass    mediastinal adenopathy    Past Surgical History:  Procedure Laterality Date   BIOPSY  01/09/2020   Procedure: BIOPSY;  Surgeon: Tomma Lightning, MD;  Location: [facility];  Service: Endoscopy;;   BRONCHIAL WASHINGS  01/09/2020   Procedure: BRONCHIAL WASHINGS;  Surgeon: Tomma Lightning, MD;  Location: [facility];  Service: Endoscopy;;   CATARACT EXTRACTION W/ INTRAOCULAR LENS IMPLANT     right   COLONOSCOPY     IR IMAGING GUIDED PORT INSERTION  01/31/2019   RADIOFREQUENCY ABLATION     x2 for fib, on 2nd attempt switched form a flutter ot a fib.    ROTATOR CUFF REPAIR Right    TONSILLECTOMY     VIDEO BRONCHOSCOPY N/A 01/09/2020   Procedure: VIDEO BRONCHOSCOPY WITH FLUORO;  Surgeon: Tomma Lightning, MD;  Location: [facility];  Service: Endoscopy;  Laterality: N/A;   VIDEO BRONCHOSCOPY WITH ENDOBRONCHIAL ULTRASOUND Left 12/30/2018   Procedure: VIDEO BRONCHOSCOPY WITH ENDOBRONCHIAL ULTRASOUND WITH FLUOROSCOPY;  Surgeon: Tomma Lightning, MD;  Location: [facility] OR;  Service: Thoracic;  Laterality: Left;   WISDOM TOOTH EXTRACTION      MEDICATIONS:  amLODipine (NORVASC) 5 MG tablet   atenolol (TENORMIN) 50 MG tablet   atorvastatin (LIPITOR) 10 MG tablet   Multiple Vitamin (MULTIVITAMIN WITH MINERALS) TABS tablet   Multiple Vitamins-Minerals (PRESERVISION AREDS 2) CAPS   XARELTO 20 MG TABS tablet   No current facility-administered medications for this encounter.

## 2023-06-18 NOTE — Anesthesia Preprocedure Evaluation (Addendum)
 Anesthesia Evaluation  Patient identified by MRN, date of birth, ID band Patient awake    Reviewed: Allergy & Precautions, H&P , NPO status , Patient's Chart, lab work & pertinent test results  Airway Mallampati: II  TM Distance: >3 FB Neck ROM: Full    Dental no notable dental hx.    Pulmonary former smoker   Pulmonary exam normal breath sounds clear to auscultation       Cardiovascular hypertension, + CAD  Normal cardiovascular exam+ dysrhythmias Atrial Fibrillation  Rhythm:Regular Rate:Normal     Neuro/Psych  PSYCHIATRIC DISORDERS  Depression    negative neurological ROS     GI/Hepatic negative GI ROS, Neg liver ROS,,,  Endo/Other  negative endocrine ROS    Renal/GU negative Renal ROS  negative genitourinary   Musculoskeletal  (+) Arthritis ,    Abdominal   Peds negative pediatric ROS (+)  Hematology negative hematology ROS (+)   Anesthesia Other Findings   Reproductive/Obstetrics negative OB ROS                             Anesthesia Physical Anesthesia Plan  ASA: 3  Anesthesia Plan: Spinal   Post-op Pain Management: Regional block*   Induction:   PONV Risk Score and Plan: 1 and Treatment may vary due to age or medical condition  Airway Management Planned: Natural Airway  Additional Equipment:   Intra-op Plan:   Post-operative Plan:   Informed Consent: I have reviewed the patients History and Physical, chart, labs and discussed the procedure including the risks, benefits and alternatives for the proposed anesthesia with the patient or authorized representative who has indicated his/her understanding and acceptance.     Dental advisory given  Plan Discussed with: CRNA  Anesthesia Plan Comments: (See PAT note 06/16/2023)       Anesthesia Quick Evaluation

## 2023-06-23 ENCOUNTER — Other Ambulatory Visit: Payer: Self-pay

## 2023-06-23 ENCOUNTER — Ambulatory Visit (HOSPITAL_COMMUNITY)

## 2023-06-23 ENCOUNTER — Ambulatory Visit (HOSPITAL_COMMUNITY)
Admission: RE | Admit: 2023-06-23 | Discharge: 2023-06-23 | Disposition: A | Discharge status: Home / Self Care | Payer: Medicare Other | Attending: Orthopedic Surgery | Admitting: Orthopedic Surgery

## 2023-06-23 ENCOUNTER — Ambulatory Visit (HOSPITAL_COMMUNITY): Payer: Self-pay | Admitting: Physician Assistant

## 2023-06-23 ENCOUNTER — Ambulatory Visit (HOSPITAL_BASED_OUTPATIENT_CLINIC_OR_DEPARTMENT_OTHER): Admitting: Certified Registered"

## 2023-06-23 ENCOUNTER — Encounter (HOSPITAL_COMMUNITY)
Admission: RE | Disposition: A | Discharge status: Home / Self Care | Payer: Self-pay | Source: Home / Self Care | Attending: Orthopedic Surgery

## 2023-06-23 ENCOUNTER — Encounter (HOSPITAL_COMMUNITY): Payer: Self-pay | Admitting: Orthopedic Surgery

## 2023-06-23 DIAGNOSIS — I2510 Atherosclerotic heart disease of native coronary artery without angina pectoris: Secondary | ICD-10-CM | POA: Insufficient documentation

## 2023-06-23 DIAGNOSIS — Z87891 Personal history of nicotine dependence: Secondary | ICD-10-CM | POA: Diagnosis not present

## 2023-06-23 DIAGNOSIS — Z7901 Long term (current) use of anticoagulants: Secondary | ICD-10-CM | POA: Insufficient documentation

## 2023-06-23 DIAGNOSIS — I10 Essential (primary) hypertension: Secondary | ICD-10-CM | POA: Insufficient documentation

## 2023-06-23 DIAGNOSIS — G8918 Other acute postprocedural pain: Secondary | ICD-10-CM | POA: Diagnosis not present

## 2023-06-23 DIAGNOSIS — Z9889 Other specified postprocedural states: Secondary | ICD-10-CM | POA: Diagnosis not present

## 2023-06-23 DIAGNOSIS — I482 Chronic atrial fibrillation, unspecified: Secondary | ICD-10-CM | POA: Insufficient documentation

## 2023-06-23 DIAGNOSIS — M1711 Unilateral primary osteoarthritis, right knee: Secondary | ICD-10-CM | POA: Insufficient documentation

## 2023-06-23 DIAGNOSIS — R609 Edema, unspecified: Secondary | ICD-10-CM | POA: Diagnosis not present

## 2023-06-23 HISTORY — PX: PARTIAL KNEE ARTHROPLASTY: SHX2174

## 2023-06-23 LAB — TYPE AND SCREEN
ABO/RH(D): O POS
Antibody Screen: NEGATIVE

## 2023-06-23 SURGERY — ARTHROPLASTY, KNEE, UNICOMPARTMENTAL
Anesthesia: Spinal | Site: Knee | Laterality: Right

## 2023-06-23 MED ORDER — MIDAZOLAM HCL 2 MG/2ML IJ SOLN: 2.0000 mg | Freq: Once | INTRAMUSCULAR | Status: DC

## 2023-06-23 MED ORDER — PROPOFOL 1000 MG/100ML IV EMUL
INTRAVENOUS | Status: AC
  Filled 2023-06-23: qty 100

## 2023-06-23 MED ORDER — MUPIROCIN 2 % EX OINT
1.0000 | TOPICAL_OINTMENT | Freq: Two times a day (BID) | CUTANEOUS | 0 refills | Status: AC
Start: 2023-06-23 — End: 2023-07-23

## 2023-06-23 MED ORDER — METHOCARBAMOL 1000 MG/10ML IJ SOLN: 500.0000 mg | Freq: Four times a day (QID) | INTRAMUSCULAR | Status: DC | PRN

## 2023-06-23 MED ORDER — LACTATED RINGERS IV BOLUS
500.0000 mL | Freq: Once | INTRAVENOUS | Status: AC
  Administered 2023-06-23: 500 mL via INTRAVENOUS

## 2023-06-23 MED ORDER — ACETAMINOPHEN 500 MG PO TABS: 1000.0000 mg | ORAL_TABLET | Freq: Three times a day (TID) | ORAL | Status: AC | PRN

## 2023-06-23 MED ORDER — DEXAMETHASONE SODIUM PHOSPHATE 10 MG/ML IJ SOLN
8.0000 mg | Freq: Once | INTRAMUSCULAR | Status: AC
Start: 2023-06-23 — End: 2023-06-23
  Administered 2023-06-23: 4 mg via INTRAVENOUS

## 2023-06-23 MED ORDER — POVIDONE-IODINE 10 % EX SWAB: 2.0000 | Freq: Once | CUTANEOUS | Status: DC

## 2023-06-23 MED ORDER — LACTATED RINGERS IV SOLN: INTRAVENOUS | Status: DC

## 2023-06-23 MED ORDER — DROPERIDOL 2.5 MG/ML IJ SOLN: 0.6250 mg | Freq: Once | INTRAMUSCULAR | Status: DC | PRN

## 2023-06-23 MED ORDER — PROPOFOL 500 MG/50ML IV EMUL
INTRAVENOUS | Status: DC | PRN
  Administered 2023-06-23: 75 ug/kg/min via INTRAVENOUS

## 2023-06-23 MED ORDER — FENTANYL CITRATE PF 50 MCG/ML IJ SOSY
100.0000 ug | PREFILLED_SYRINGE | Freq: Once | INTRAMUSCULAR | Status: AC
  Administered 2023-06-23: 50 ug via INTRAVENOUS
  Filled 2023-06-23: qty 2

## 2023-06-23 MED ORDER — LACTATED RINGERS IV BOLUS: 250.0000 mL | Freq: Once | INTRAVENOUS | Status: DC

## 2023-06-23 MED ORDER — SODIUM CHLORIDE (PF) 0.9 % IJ SOLN
INTRAMUSCULAR | Status: DC | PRN
  Administered 2023-06-23: 80 mL

## 2023-06-23 MED ORDER — ACETAMINOPHEN 500 MG PO TABS
1000.0000 mg | ORAL_TABLET | Freq: Once | ORAL | Status: AC
  Administered 2023-06-23: 1000 mg via ORAL
  Filled 2023-06-23: qty 2

## 2023-06-23 MED ORDER — OXYCODONE HCL 5 MG PO TABS: 5.0000 mg | ORAL_TABLET | Freq: Once | ORAL | Status: DC | PRN

## 2023-06-23 MED ORDER — ONDANSETRON HCL 4 MG PO TABS: 4.0000 mg | ORAL_TABLET | Freq: Three times a day (TID) | ORAL | 0 refills | Status: AC | PRN

## 2023-06-23 MED ORDER — SODIUM CHLORIDE 0.9 % IR SOLN
Status: DC | PRN
  Administered 2023-06-23: 1000 mL

## 2023-06-23 MED ORDER — EPHEDRINE SULFATE-NACL 50-0.9 MG/10ML-% IV SOSY
PREFILLED_SYRINGE | INTRAVENOUS | Status: DC | PRN
  Administered 2023-06-23 (×2): 5 mg via INTRAVENOUS
  Administered 2023-06-23: 10 mg via INTRAVENOUS

## 2023-06-23 MED ORDER — LIDOCAINE HCL (PF) 2 % IJ SOLN
INTRAMUSCULAR | Status: DC | PRN
  Administered 2023-06-23: 40 mg via INTRADERMAL

## 2023-06-23 MED ORDER — DEXAMETHASONE SODIUM PHOSPHATE 10 MG/ML IJ SOLN
INTRAMUSCULAR | Status: AC
  Filled 2023-06-23: qty 1

## 2023-06-23 MED ORDER — ONDANSETRON HCL 4 MG/2ML IJ SOLN
INTRAMUSCULAR | Status: AC
  Filled 2023-06-23: qty 2

## 2023-06-23 MED ORDER — OXYCODONE HCL 5 MG/5ML PO SOLN: 5.0000 mg | Freq: Once | ORAL | Status: DC | PRN

## 2023-06-23 MED ORDER — RIVAROXABAN 20 MG PO TABS: 20.0000 mg | ORAL_TABLET | Freq: Every day | ORAL | Status: DC

## 2023-06-23 MED ORDER — OXYCODONE HCL 5 MG PO TABS: 5.0000 mg | ORAL_TABLET | ORAL | 0 refills | Status: AC | PRN

## 2023-06-23 MED ORDER — ROPIVACAINE HCL 5 MG/ML IJ SOLN
INTRAMUSCULAR | Status: DC | PRN
  Administered 2023-06-23: 20 mL via PERINEURAL

## 2023-06-23 MED ORDER — 0.9 % SODIUM CHLORIDE (POUR BTL) OPTIME
TOPICAL | Status: DC | PRN
  Administered 2023-06-23: 1000 mL

## 2023-06-23 MED ORDER — SODIUM CHLORIDE (PF) 0.9 % IJ SOLN
INTRAMUSCULAR | Status: AC
  Filled 2023-06-23: qty 30

## 2023-06-23 MED ORDER — PROPOFOL 10 MG/ML IV BOLUS
INTRAVENOUS | Status: DC | PRN
  Administered 2023-06-23 (×2): 30 mg via INTRAVENOUS

## 2023-06-23 MED ORDER — CELECOXIB 100 MG PO CAPS: 100.0000 mg | ORAL_CAPSULE | Freq: Two times a day (BID) | ORAL | 0 refills | Status: AC

## 2023-06-23 MED ORDER — BUPIVACAINE LIPOSOME 1.3 % IJ SUSP: 20.0000 mL | Freq: Once | INTRAMUSCULAR | Status: DC

## 2023-06-23 MED ORDER — PROPOFOL 10 MG/ML IV BOLUS
INTRAVENOUS | Status: AC
  Filled 2023-06-23: qty 20

## 2023-06-23 MED ORDER — ONDANSETRON HCL 4 MG/2ML IJ SOLN
INTRAMUSCULAR | Status: DC | PRN
  Administered 2023-06-23: 4 mg via INTRAVENOUS

## 2023-06-23 MED ORDER — ATENOLOL 50 MG PO TABS
50.0000 mg | ORAL_TABLET | Freq: Once | ORAL | Status: AC
  Administered 2023-06-23: 50 mg via ORAL
  Filled 2023-06-23: qty 1

## 2023-06-23 MED ORDER — FENTANYL CITRATE PF 50 MCG/ML IJ SOSY: 25.0000 ug | PREFILLED_SYRINGE | INTRAMUSCULAR | Status: DC | PRN

## 2023-06-23 MED ORDER — BUPIVACAINE IN DEXTROSE 0.75-8.25 % IT SOLN
INTRATHECAL | Status: DC | PRN
  Administered 2023-06-23: 1.8 mL via INTRATHECAL

## 2023-06-23 MED ORDER — LACTATED RINGERS IV BOLUS
250.0000 mL | Freq: Once | INTRAVENOUS | Status: AC
  Administered 2023-06-23: 250 mL via INTRAVENOUS

## 2023-06-23 MED ORDER — LIDOCAINE HCL (PF) 2 % IJ SOLN
INTRAMUSCULAR | Status: AC
  Filled 2023-06-23: qty 5

## 2023-06-23 MED ORDER — METHOCARBAMOL 500 MG PO TABS: 500.0000 mg | ORAL_TABLET | Freq: Four times a day (QID) | ORAL | Status: DC | PRN

## 2023-06-23 MED ORDER — BUPIVACAINE-EPINEPHRINE (PF) 0.25% -1:200000 IJ SOLN
INTRAMUSCULAR | Status: AC
  Filled 2023-06-23: qty 30

## 2023-06-23 MED ORDER — EPHEDRINE 5 MG/ML INJ
INTRAVENOUS | Status: AC
  Filled 2023-06-23: qty 5

## 2023-06-23 MED ORDER — TRANEXAMIC ACID-NACL 1000-0.7 MG/100ML-% IV SOLN
1000.0000 mg | INTRAVENOUS | Status: AC
  Administered 2023-06-23: 1000 mg via INTRAVENOUS
  Filled 2023-06-23: qty 100

## 2023-06-23 MED ORDER — BUPIVACAINE LIPOSOME 1.3 % IJ SUSP
INTRAMUSCULAR | Status: AC
  Filled 2023-06-23: qty 20

## 2023-06-23 MED ORDER — CHLORHEXIDINE GLUCONATE 0.12 % MT SOLN
15.0000 mL | Freq: Once | OROMUCOSAL | Status: AC
  Administered 2023-06-23: 15 mL via OROMUCOSAL

## 2023-06-23 MED ORDER — CEFAZOLIN SODIUM-DEXTROSE 2-4 GM/100ML-% IV SOLN: 2.0000 g | Freq: Four times a day (QID) | INTRAVENOUS | Status: DC

## 2023-06-23 MED ORDER — CEFAZOLIN SODIUM-DEXTROSE 2-4 GM/100ML-% IV SOLN
2.0000 g | INTRAVENOUS | Status: AC
  Administered 2023-06-23: 2 g via INTRAVENOUS
  Filled 2023-06-23: qty 100

## 2023-06-23 MED ORDER — METHOCARBAMOL 500 MG PO TABS
500.0000 mg | ORAL_TABLET | Freq: Three times a day (TID) | ORAL | 0 refills | Status: AC | PRN
Start: 2023-06-23 — End: 2023-07-03

## 2023-06-23 MED ORDER — ACETAMINOPHEN 10 MG/ML IV SOLN: 1000.0000 mg | Freq: Once | INTRAVENOUS | Status: DC | PRN

## 2023-06-23 MED ORDER — CHLORHEXIDINE GLUCONATE 4 % EX SOLN
1.0000 | CUTANEOUS | 1 refills | Status: DC
Start: 2023-06-23 — End: 2024-01-07

## 2023-06-23 MED ORDER — PHENYLEPHRINE HCL-NACL 20-0.9 MG/250ML-% IV SOLN
INTRAVENOUS | Status: DC | PRN
  Administered 2023-06-23: 20 ug/min via INTRAVENOUS

## 2023-06-23 MED ORDER — SODIUM CHLORIDE 0.9 % IV SOLN: INTRAVENOUS | Status: DC

## 2023-06-23 MED ORDER — STERILE WATER FOR IRRIGATION IR SOLN
Status: DC | PRN
Start: 2023-06-23 — End: 2023-06-23
  Administered 2023-06-23: 1000 mL

## 2023-06-23 SURGICAL SUPPLY — 53 items
BAG COUNTER SPONGE SURGICOUNT (BAG) ×1 IMPLANT
BLADE SAW RECIPROCATING 77.5 (BLADE) ×1 IMPLANT
BLADE SAW SGTL 13.0X1.19X90.0M (BLADE) ×1 IMPLANT
BNDG COHESIVE 3X5 TAN ST LF (GAUZE/BANDAGES/DRESSINGS) ×1 IMPLANT
BNDG ELASTIC 6X10 VLCR STRL LF (GAUZE/BANDAGES/DRESSINGS) ×1 IMPLANT
BOWL SMART MIX CTS (DISPOSABLE) ×1 IMPLANT
CEMENT BONE R 1X40 (Cement) IMPLANT
CEMENT BONE REFOBACIN R1X40 US (Cement) IMPLANT
CHLORAPREP W/TINT 26 (MISCELLANEOUS) ×1 IMPLANT
COMP FEM CMT PS KNEE RM (Joint) ×1 IMPLANT
COMP PARTIAL KNEE PS E RM (Joint) ×1 IMPLANT
COMPONENT FEM CMT PS KNEE RM (Joint) IMPLANT
COMPONENT PARTIAL KNEE PS E RM (Joint) IMPLANT
COVER SURGICAL LIGHT HANDLE (MISCELLANEOUS) ×1 IMPLANT
CUFF TRNQT CYL 34X4.125X (TOURNIQUET CUFF) ×1 IMPLANT
DERMABOND ADVANCED .7 DNX12 (GAUZE/BANDAGES/DRESSINGS) ×1 IMPLANT
DRAPE INCISE IOBAN 85X60 (DRAPES) ×1 IMPLANT
DRAPE SHEET LG 3/4 BI-LAMINATE (DRAPES) ×1 IMPLANT
DRAPE U-SHAPE 47X51 STRL (DRAPES) ×1 IMPLANT
DRSG AQUACEL AG ADV 3.5X 6 (GAUZE/BANDAGES/DRESSINGS) IMPLANT
ELECT REM PT RETURN 15FT ADLT (MISCELLANEOUS) ×1 IMPLANT
GAUZE SPONGE 4X4 12PLY STRL (GAUZE/BANDAGES/DRESSINGS) ×1 IMPLANT
GLOVE BIO SURGEON STRL SZ 6.5 (GLOVE) ×1 IMPLANT
GLOVE BIOGEL PI IND STRL 6.5 (GLOVE) ×2 IMPLANT
GLOVE BIOGEL PI IND STRL 8 (GLOVE) ×1 IMPLANT
GLOVE SURG ORTHO 8.0 STRL STRW (GLOVE) ×2 IMPLANT
GOWN STRL REUS W/ TWL XL LVL3 (GOWN DISPOSABLE) ×2 IMPLANT
HOOD PEEL AWAY T7 (MISCELLANEOUS) ×3 IMPLANT
INSERT TIB FB KNEE E 8 RT (Insert) IMPLANT
KIT TURNOVER KIT A (KITS) IMPLANT
MANIFOLD NEPTUNE II (INSTRUMENTS) ×1 IMPLANT
MARKER SKIN DUAL TIP RULER LAB (MISCELLANEOUS) ×1 IMPLANT
NDL SPNL 18GX3.5 QUINCKE PK (NEEDLE) IMPLANT
NEEDLE SPNL 18GX3.5 QUINCKE PK (NEEDLE) ×1 IMPLANT
NS IRRIG 1000ML POUR BTL (IV SOLUTION) ×1 IMPLANT
PACK TOTAL KNEE CUSTOM (KITS) ×1 IMPLANT
PIN DRILL HDLS TROCAR 75 4PK (PIN) IMPLANT
SCREW HEADED 33MM KNEE (MISCELLANEOUS) IMPLANT
SCREW HEADED 48MM KNEE (MISCELLANEOUS) IMPLANT
SET HNDPC FAN SPRY TIP SCT (DISPOSABLE) ×1 IMPLANT
SOLUTION IRRIG SURGIPHOR (IV SOLUTION) IMPLANT
STRIP CLOSURE SKIN 1/2X4 (GAUZE/BANDAGES/DRESSINGS) ×1 IMPLANT
SUT MNCRL AB 3-0 PS2 18 (SUTURE) ×1 IMPLANT
SUT MNCRL AB 3-0 PS2 27 (SUTURE) ×1 IMPLANT
SUT STRATAFIX 0 PDS 27 VIOLET (SUTURE) ×1 IMPLANT
SUT STRATAFIX 14 PDO 48 VLT (SUTURE) ×1 IMPLANT
SUT STRATAFIX PDO 1 14 VIOLET (SUTURE) ×1 IMPLANT
SUT VIC AB 2-0 CT2 27 (SUTURE) ×1 IMPLANT
SUTURE STRATFX 0 PDS 27 VIOLET (SUTURE) ×1 IMPLANT
TRAY FOLEY MTR SLVR 16FR STAT (SET/KITS/TRAYS/PACK) IMPLANT
TUBE SUCTION HIGH CAP CLEAR NV (SUCTIONS) ×1 IMPLANT
UNDERPAD 30X36 HEAVY ABSORB (UNDERPADS AND DIAPERS) ×1 IMPLANT
WRAP KNEE MAXI GEL POST OP (GAUZE/BANDAGES/DRESSINGS) ×1 IMPLANT

## 2023-06-23 NOTE — Anesthesia Procedure Notes (Signed)
 Anesthesia Regional Block: Adductor canal block   Pre-Anesthetic Checklist: , timeout performed,  Correct Patient, Correct Site, Correct Laterality,  Correct Procedure, Correct Position, site marked,  Risks and benefits discussed,  Surgical consent,  Pre-op evaluation,  At surgeon's request and post-op pain management  Laterality: Right  Prep: chloraprep       Needles:  Injection technique: Single-shot  Needle Type: Echogenic Stimulator Needle     Needle Length: 9cm  Needle Gauge: 21     Additional Needles:   Procedures:,,,, ultrasound used (permanent image in chart),,    Narrative:  Start time: 06/23/2023 9:22 AM End time: 06/23/2023 9:28 AM Injection made incrementally with aspirations every 5 mL.  Performed by: Personally  Anesthesiologist: Mize Nation, MD  Additional Notes: Discussed risks and benefits of the nerve block in detail, including but not limited vascular injury, permanent nerve damage and infection.   Patient tolerated the procedure well. Local anesthetic introduced in an incremental fashion under minimal resistance after negative aspirations. No paresthesias were elicited. After completion of the procedure, no acute issues were identified and patient continued to be monitored by RN.

## 2023-06-23 NOTE — Evaluation (Signed)
 Physical Therapy Treatment Patient Details Name: Martin Bailey MRN: [id number] DOB: Aug 06, 1940 Today's Date: 06/23/2023   History of Present Illness 83 yo male s/p R UKA on 06/23/23. PMH: MDD, HTN, afib    PT Comments  Patient evaluated by Physical Therapy with no further acute PT needs identified. All education has been completed and the patient has no further questions.  Pt doing well,pain controlled, regional block likely still in effect; dtr present for session, supportive; activity progression, HEP and mobility reviewed as below; pt is ready to d/c from PT standpoint with family assist as needed.  See below for any follow-up Physical Therapy or equipment needs. PT is signing off. Thank you for this referral.     If plan is discharge home, recommend the following: A little help with walking and/or transfers;A little help with bathing/dressing/bathroom;Help with stairs or ramp for entrance;Assistance with cooking/housework;Assist for transportation   Can travel by private vehicle        Equipment Recommendations  Rolling walker (2 wheels)    Recommendations for Other Services       Precautions / Restrictions Precautions Precautions: Knee;Fall Restrictions Weight Bearing Restrictions Per Provider Order: No Other Position/Activity Restrictions: WBAT     Mobility  Bed Mobility Overal bed mobility: Needs Assistance Bed Mobility: Supine to Sit     Supine to sit: Supervision     General bed mobility comments: for safety    Transfers Overall transfer level: Needs assistance Equipment used: Rolling walker (2 wheels) Transfers: Sit to/from Stand Sit to Stand: Contact guard assist           General transfer comment: cues for hand placement and LE position    Ambulation/Gait Ambulation/Gait assistance: Contact guard assist Gait Distance (Feet): 120 Feet Assistive device: Rolling walker (2 wheels) Gait Pattern/deviations: Step-to pattern, Step-through  pattern       General Gait Details: cues for sequence and RW position; no LOB, no knee instability noted   Stairs Stairs: Yes Stairs assistance: Contact guard assist Stair Management: One rail Right, One rail Left, Forwards, With cane Number of Stairs: 3 General stair comments: cues for sequence and techinque, good stability, no LOB or knee buckling. dtr present and abvle to assist as needed   Wheelchair Mobility     Tilt Bed    Modified Rankin (Stroke Patients Only)       Balance Overall balance assessment: No apparent balance deficits (not formally assessed)                                          Communication Communication Communication: No apparent difficulties  Cognition Arousal: Alert Behavior During Therapy: WFL for tasks assessed/performed   PT - Cognitive impairments: No apparent impairments                         Following commands: Intact      Cueing Cueing Techniques: Verbal cues  Exercises Total Joint Exercises Ankle Circles/Pumps: AROM, Both, 5 reps Quad Sets: 5 reps, Both, AROM Heel Slides: AROM, Right, 10 reps Straight Leg Raises: AROM, Right, 5 reps    General Comments        Pertinent Vitals/Pain Pain Assessment Pain Assessment: 0-10 Pain Score: 2  Pain Location: R knee Pain Descriptors / Indicators: Discomfort Pain Intervention(s): Limited activity within patient's tolerance, Monitored during session, Premedicated before session, Repositioned  Home Living Family/patient expects to be discharged to:: Private residence Living Arrangements: Alone Available Help at Discharge: Family   Home Access: Stairs to enter Entrance Stairs-Rails: Right Entrance Stairs-Number of Steps: 7 Alternate Level Stairs-Number of Steps: 7 Home Layout: Multi-level Home Equipment: Cane - single point Additional Comments: dtr will be assisting after surgery    Prior Function            PT Goals (current goals can  now be found in the care plan section) Acute Rehab PT Goals PT Goal Formulation: All assessment and education complete, DC therapy    Frequency           PT Plan      Co-evaluation              AM-PAC PT "6 Clicks" Mobility   Outcome Measure  Help needed turning from your back to your side while in a flat bed without using bedrails?: A Little Help needed moving from lying on your back to sitting on the side of a flat bed without using bedrails?: A Little Help needed moving to and from a bed to a chair (including a wheelchair)?: A Little Help needed standing up from a chair using your arms (e.g., wheelchair or bedside chair)?: A Little Help needed to walk in hospital room?: A Little Help needed climbing 3-5 steps with a railing? : A Little 6 Click Score: 18    End of Session Equipment Utilized During Treatment: Gait belt Activity Tolerance: Patient tolerated treatment well Patient left: with call bell/phone within reach;in bed;with family/visitor present Nurse Communication: Mobility status PT Visit Diagnosis: Other abnormalities of gait and mobility (R26.89)     Time: 1610-9604 PT Time Calculation (min) (ACUTE ONLY): 33 min  Charges:    $Gait Training: 8-22 mins PT General Charges $$ ACUTE PT VISIT: 1 Visit                     Louis Ivery, PT  Acute Rehab Dept [facility]) [phone]  06/23/2023    [facility] 06/23/2023, 5:17 PM

## 2023-06-23 NOTE — Progress Notes (Signed)
 Orthopedic Tech Progress Note Patient Details:  Martin Bailey 12/07/1940 [id number]  Ortho Devices Type of Ortho Device: Bone foam zero knee Ortho Device/Splint Location: right Ortho Device/Splint Interventions: Ordered, Application, Adjustment   Post Interventions Patient Tolerated: Well Instructions Provided: Adjustment of device, Care of device  Kizzie Fantasia 06/23/2023, 12:56 PM

## 2023-06-23 NOTE — Anesthesia Procedure Notes (Addendum)
 Spinal  Patient location during procedure: OR Start time: 06/23/2023 10:31 AM Reason for block: surgical anesthesia Staffing Performed: resident/CRNA  Anesthesiologist: [place] Nation, MD Resident/CRNA: Sindy Guadeloupe, CRNA Performed by: Sindy Guadeloupe, CRNA Authorized by: Clarkson Nation, MD   Preanesthetic Checklist Completed: patient identified, IV checked, site marked, risks and benefits discussed, surgical consent, monitors and equipment checked, pre-op evaluation and timeout performed Spinal Block Patient position: sitting Prep: DuraPrep and site prepped and draped Patient monitoring: continuous pulse ox, blood pressure, cardiac monitor and heart rate Approach: midline Location: L3-4 Injection technique: single-shot Needle Needle type: Pencan  Needle gauge: 24 G Needle length: 10 cm Assessment Events: CSF return Additional Notes Pt placed in sitting position, spinal kit expiration date checked and verified, timeout with Dr Charlynn Grimes, + CSF, - heme, pt tolerated well. Dr Charlynn Grimes present and supervising throughout SAB placement. Adequate sensory level. Lot [id number]

## 2023-06-23 NOTE — Op Note (Signed)
 06/23/2023  4:14 PM  PATIENT:  Martin Bailey    PRE-OPERATIVE DIAGNOSIS: End-stage right medial compartment osteoarthritis  POST-OPERATIVE DIAGNOSIS:  Same  PROCEDURE: Right cemented medial unicompartmental Knee Arthroplasty  SURGEON:  Joen Laura, MD  PHYSICIAN ASSISTANT: Kathie Dike, PA-C, present and scrubbed throughout the case, critical for completion in a timely fashion, and for retraction, instrumentation, and closure.  ANESTHESIA:   Spinal  ESTIMATED BLOOD LOSS: 50cc  PREOPERATIVE INDICATIONS:  Martin Bailey is a  83 y.o. male with a diagnosis of OA RIGHT KNEE who failed conservative measures and elected for surgical management.    The risks benefits and alternatives were discussed with the patient preoperatively including but not limited to the risks of infection, bleeding, nerve injury, cardiopulmonary complications, blood clots, the need for revision surgery, among others, and the patient was willing to proceed.  OPERATIVE IMPLANTS: [employer] PPK fixed bearing medial compartment arthroplasty femur size 4, tibia size E, bearing size 8mm.  OPERATIVE FINDINGS: Endstage grade 4 anteromedial compartment osteoarthritis. No significant changes in the lateral or patellofemoral joint.  The ACL was intact.  OPERATIVE PROCEDURE:   Once adequate anesthesia, preoperative antibiotics, 2 gm of ancef,1 gm of Tranexamic Acid, and 8 mg of Decadron administered, the patient was positioned supine with a right thigh tourniquet placed.  The right lower extremity was prepped and draped in sterile fashion.  A time-  out was performed identifying the patient, planned procedure, and the appropriate extremity.   The leg was elevated and exsanguinated and the tourniquet was inflated. Anterior midline incision was performed. Medial Parapatellar incision was carried out, and the osteophytes were excised, along with the medial plateau and femoral condyle. Resected anterior  horn of medial meniscus and a small portion of the fat pad. Medial release was performed.  Remainder of the knee was evaluated.  ACL was intact lateral compartment with no significant wear.  Patellofemoral compartment with mild wear.  The extra medullary tibial cutting jig was applied, and resection was performed measuring 4 mm from the anterior medial defect.  Cut was made perpendicular to the axis of the tibia, matching the patient's native slope, and sagittal cut was made with appropriate rotation and just medial to the ACL insertion.    The proximal tibial bony cut was removed in one piece, and I turned my attention to the femur.  Rasp was used to clear debris from the corner of the cut.  We next turned our attention to the femur.  Distal femoral cutting block was put in place and pinned and the distal femur was cut.  The cutting guide was removed and the cut was completed with the knee in flexion.  We then sized the femur to be a 4 and then pinned the femur cutting guide into place taking care to appropriately lateralized and not overhang the component, medially or anteriorly.  The femur was drilled and the posterior and chamfer cuts were made.  With the knee in flexion medial meniscectomy was performed.  We next size of the tibia and found it to be a size E.  The tibial trial was then impacted into place in the right position pinned and the lugs were drilled.  The trial femoral component was then impacted, and a size 8mm liner trial was inserted.  Notably the knee was tight in both flexion extension unable to get the Ambre stick into the knee.  Elected to resect additional 2 mm off the proximal tibia.  Trial implants were  then reinserted and at this point much more appropriate balance. With the trial liner in place we used the Amber sticks and had approximately 2 mm of laxity in extension and 2 to 3 mm of laxity in flexion.  The femur also tracked appropriately on the center of the tibial  component through flexion.   I then cemented the components into place, cementing the tibia first, removing all excess cement, and then cementing the femur.  All loose cement was removed.  The trial liner was again inserted until cement had completely set.  The wounds were thoroughly irrigated with normal saline pulse lavage and injected with 20cc Exparel diluted with 30cc 0.25% marcaine with epi.  Once the cement was set again we assessed stability and balance which we felt was appropriate with a size 8 mm liner.  The real polyethylene liner was opened and inserted.   The tourniquet was let down  No significant   hemostasis was required.  The medial parapatellar arthrotomy was then reapproximated using #1 Stratafix sutures with the knee  in flexion.  The   remaining wound was closed with 0 stratafix, 2-0 Vicryl, and running 3-0 Monocryl.   The knee was cleaned, dried, dressed sterilely using Dermabond and   Aquacel dressing.  The patient was then  brought to recovery room in stable condition, tolerating the procedure  well. There were no complications.   Post op recs: WB: WBAT Abx: ancef periop Imaging: PACU xrays DVT prophylaxis: Resume Xarelto 10 mg postop day 1 and 2, 20 mg postop day 3 Follow up: 2 weeks after surgery for a wound check with Dr. Blanchie Dessert at [facility].  Address: [address], [place], [place] [address]  Office Phone: [phone]  Weber Cooks, MD Orthopaedic Surgery

## 2023-06-23 NOTE — Anesthesia Postprocedure Evaluation (Signed)
 Anesthesia Post Note  Patient: Martin Bailey  Procedure(s) Performed: UNICOMPARTMENTAL KNEE-MEDIAL (Right: Knee)     Patient location during evaluation: PACU Anesthesia Type: Spinal Level of consciousness: oriented and awake and alert Pain management: pain level controlled Vital Signs Assessment: post-procedure vital signs reviewed and stable Respiratory status: spontaneous breathing, respiratory function stable and patient connected to nasal cannula oxygen Cardiovascular status: blood pressure returned to baseline and stable Postop Assessment: no headache, no backache and no apparent nausea or vomiting Anesthetic complications: no   No notable events documented.  Last Vitals:  Vitals:   06/23/23 1330 06/23/23 1345  BP: 123/83 120/81  Pulse: 69 (!) 52  Resp: 15 12  Temp:  (!) 36.3 C  SpO2: 97% 99%    Last Pain:  Vitals:   06/23/23 1345  TempSrc:   PainSc: 0-No pain                 Poynor Nation

## 2023-06-23 NOTE — Transfer of Care (Signed)
 Immediate Anesthesia Transfer of Care Note  Patient: Martin Bailey  Procedure(s) Performed: UNICOMPARTMENTAL KNEE-MEDIAL (Right: Knee)  Patient Location: PACU  Anesthesia Type:Spinal  Level of Consciousness: awake, alert , and patient cooperative  Airway & Oxygen Therapy: Patient Spontanous Breathing and Patient connected to face mask oxygen  Post-op Assessment: Report given to RN and Post -op Vital signs reviewed and stable  Post vital signs: Reviewed and stable  Last Vitals:  Vitals Value Taken Time  BP 112/67 06/23/23 1247  Temp    Pulse 108 06/23/23 1249  Resp 13 06/23/23 1249  SpO2 100 % 06/23/23 1249  Vitals shown include unfiled device data.  Last Pain:  Vitals:   06/23/23 0818  TempSrc:   PainSc: 0-No pain         Complications: No notable events documented.

## 2023-06-23 NOTE — Discharge Instructions (Addendum)
 INSTRUCTIONS AFTER JOINT REPLACEMENT   Remove items at home which could result in a fall. This includes throw rugs or furniture in walking pathways ICE to the affected joint every three hours while awake for 30 minutes at a time, for at least the first 3-5 days, and then as needed for pain and swelling.  Continue to use ice for pain and swelling. You may notice swelling that will progress down to the foot and ankle.  This is normal after surgery.  Elevate your leg when you are not up walking on it.   Continue to use the breathing machine you got in the hospital (incentive spirometer) which will help keep your temperature down.  It is common for your temperature to cycle up and down following surgery, especially at night when you are not up moving around and exerting yourself.  The breathing machine keeps your lungs expanded and your temperature down.  DIET:  As you were doing prior to hospitalization, we recommend a well-balanced diet.  DRESSING / WOUND CARE / SHOWERING:  Keep the surgical dressing until follow up.  The dressing is water proof, so you can shower without any extra covering.  IF THE DRESSING FALLS OFF or the wound gets wet inside, change the dressing with sterile gauze.  Please use good hand washing techniques before changing the dressing.  Do not use any lotions or creams on the incision until instructed by your surgeon.    ACTIVITY  Increase activity slowly as tolerated, but follow the weight bearing instructions below.   No driving for 6 weeks or until further direction given by your physician.  You cannot drive while taking narcotics.  No lifting or carrying greater than 10 lbs. until further directed by your surgeon. Avoid periods of inactivity such as sitting longer than an hour when not asleep. This helps prevent blood clots.  You may return to work once you are authorized by your doctor.   WEIGHT BEARING: Weight bearing as tolerated with assist device (walker, cane, etc) as  directed, use it as long as suggested by your surgeon or therapist, typically at least 4-6 weeks.  EXERCISES  Results after joint replacement surgery are often greatly improved when you follow the exercise, range of motion and muscle strengthening exercises prescribed by your doctor. Safety measures are also important to protect the joint from further injury. Any time any of these exercises cause you to have increased pain or swelling, decrease what you are doing until you are comfortable again and then slowly increase them. If you have problems or questions, call your caregiver or physical therapist for advice.   Rehabilitation is important following a joint replacement. After just a few days of immobilization, the muscles of the leg can become weakened and shrink (atrophy).  These exercises are designed to build up the tone and strength of the thigh and leg muscles and to improve motion. Often times heat used for twenty to thirty minutes before working out will loosen up your tissues and help with improving the range of motion but do not use heat for the first two weeks following surgery (sometimes heat can increase post-operative swelling).   These exercises can be done on a training (exercise) mat, on the floor, on a table or on a bed. Use whatever works the best and is most comfortable for you.    Use music or television while you are exercising so that the exercises are a pleasant break in your day. This will make your life  better with the exercises acting as a break in your routine that you can look forward to.   Perform all exercises about fifteen times, three times per day or as directed.  You should exercise both the operative leg and the other leg as well.  Exercises include:   Quad Sets - Tighten up the muscle on the front of the thigh (Quad) and hold for 5-10 seconds.   Straight Leg Raises - With your knee straight (if you were given a brace, keep it on), lift the leg to 60 degrees, hold  for 3 seconds, and slowly lower the leg.  Perform this exercise against resistance later as your leg gets stronger.  Leg Slides: Lying on your back, slowly slide your foot toward your buttocks, bending your knee up off the floor (only go as far as is comfortable). Then slowly slide your foot back down until your leg is flat on the floor again.  Angel Wings: Lying on your back spread your legs to the side as far apart as you can without causing discomfort.  Hamstring Strength:  Lying on your back, push your heel against the floor with your leg straight by tightening up the muscles of your buttocks.  Repeat, but this time bend your knee to a comfortable angle, and push your heel against the floor.  You may put a pillow under the heel to make it more comfortable if necessary.   A rehabilitation program following joint replacement surgery can speed recovery and prevent re-injury in the future due to weakened muscles. Contact your doctor or a physical therapist for more information on knee rehabilitation.   CONSTIPATION:  Constipation is defined medically as fewer than three stools per week and severe constipation as less than one stool per week.  Even if you have a regular bowel pattern at home, your normal regimen is likely to be disrupted due to multiple reasons following surgery.  Combination of anesthesia, postoperative narcotics, change in appetite and fluid intake all can affect your bowels.   YOU MUST use at least one of the following options; they are listed in order of increasing strength to get the job done.  They are all available over the counter, and you may need to use some, POSSIBLY even all of these options:    Drink plenty of fluids (prune juice may be helpful) and high fiber foods Colace 100 mg by mouth twice a day  Senokot for constipation as directed and as needed Dulcolax (bisacodyl), take with full glass of water  Miralax (polyethylene glycol) once or twice a day as needed.  If you  have tried all these things and are unable to have a bowel movement in the first 3-4 days after surgery call either your surgeon or your primary doctor.    If you experience loose stools or diarrhea, hold the medications until you stool forms back up.  If your symptoms do not get better within 1 week or if they get worse, check with your doctor.  If you experience "the worst abdominal pain ever" or develop nausea or vomiting, please contact the office immediately for further recommendations for treatment.  ITCHING:  If you experience itching with your medications, try taking only a single pain pill, or even half a pain pill at a time.  You can also use Benadryl over the counter for itching or also to help with sleep.   TED HOSE STOCKINGS:  Use stockings on both legs until for at least 2 weeks or  as directed by physician office. They may be removed at night for sleeping.  MEDICATIONS:  See your medication summary on the "After Visit Summary" that nursing will review with you.  You may have some home medications which will be placed on hold until you complete the course of blood thinner medication.  It is important for you to complete the blood thinner medication as prescribed.  Blood clot prevention (DVT Prophylaxis): After surgery you are at an increased risk for a blood clot. You are to resume your Xarelto after surgery to help reduce your risk of getting a blood clot.  For the first two days after surgery, take a half a dose (10 mg Xarelto) once a day.  Then on the third day, resume your full dose (20 mg Xarelto) once a day.  Take your Xarelto for a minimum of 4 weeks from a post-operative standpoint.  Then follow the direction of your regular prescribing provider.    Signs of a pulmonary embolus (blood clot in the lungs) include sudden short of breath, feeling lightheaded or dizzy, chest pain with a deep breath, rapid pulse rapid breathing.  Signs of a blood clot in your arms or legs include new  unexplained swelling and cramping, warm, red or darkened skin around the painful area.  Please call the office or 911 right away if these signs or symptoms develop.  PRECAUTIONS:   If you experience chest pain or shortness of breath - call 911 immediately for transfer to the hospital emergency department.   If you develop a fever greater that 101 F, purulent drainage from wound, increased redness or drainage from wound, foul odor from the wound/dressing, or calf pain - CONTACT YOUR SURGEON.                                                   FOLLOW-UP APPOINTMENTS:  If you do not already have a post-op appointment, please call the office for an appointment to be seen by your surgeon.  Guidelines for how soon to be seen are listed in your "After Visit Summary", but are typically between 2-3 weeks after surgery.  If you have a specialized bandage, you may be told to follow up 1 week after surgery.  OTHER INSTRUCTIONS:  Knee Replacement:  Do not place pillow under knee, focus on keeping the knee straight while resting.  Place foam block, curve side up under heel at all times except when walking.  DO NOT modify, tear, cut, or change the foam block in any way.  POST-OPERATIVE OPIOID TAPER INSTRUCTIONS: It is important to wean off of your opioid medication as soon as possible. If you do not need pain medication after your surgery it is ok to stop day one. Opioids include: Codeine, Hydrocodone(Norco, Vicodin), Oxycodone(Percocet, oxycontin) and hydromorphone amongst others.  Long term and even short term use of opiods can cause: Increased pain response Dependence Constipation Depression Respiratory depression And more.  Withdrawal symptoms can include Flu like symptoms Nausea, vomiting And more Techniques to manage these symptoms Hydrate well Eat regular healthy meals Stay active Use relaxation techniques(deep breathing, meditating, yoga) Do Not substitute Alcohol to help with tapering If you  have been on opioids for less than two weeks and do not have pain than it is ok to stop all together.  Plan to wean off of opioids This  plan should start within one week post op of your joint replacement. Maintain the same interval or time between taking each dose and first decrease the dose.  Cut the total daily intake of opioids by one tablet each day Next start to increase the time between doses. The last dose that should be eliminated is the evening dose.   MAKE SURE YOU:  Understand these instructions.  Get help right away if you are not doing well or get worse.    Thank you for letting us be a part of your medical care team.  It is a privilege we respect greatly.  We hope these instructions will help you stay on track for a fast and full recovery!

## 2023-06-23 NOTE — Interval H&P Note (Signed)
 The patient has been re-examined, and the chart reviewed, and there have been no interval changes to the documented history and physical.    Plan for R medial UKA vs possible TKA for medial compartment OA  The operative side was examined and the patient was confirmed to have sensation to DPN, SPN, TN intact, Motor EHL, ext, flex 5/5, and DP 2+, PT 2+, No significant edema.   The risks, benefits, and alternatives have been discussed at length with patient, and the patient is willing to proceed.  Right knee marked. Consent has been signed.

## 2023-06-23 NOTE — Anesthesia Procedure Notes (Signed)
 Procedure Name: MAC Date/Time: 06/23/2023 10:28 AM  Performed by: Sindy Guadeloupe, CRNAPre-anesthesia Checklist: Patient identified, Emergency Drugs available, Suction available, Patient being monitored and Timeout performed Oxygen Delivery Method: Simple face mask Placement Confirmation: positive ETCO2

## 2023-06-24 ENCOUNTER — Encounter (HOSPITAL_COMMUNITY): Payer: Self-pay | Admitting: Orthopedic Surgery

## 2023-06-28 DIAGNOSIS — M6281 Muscle weakness (generalized): Secondary | ICD-10-CM | POA: Diagnosis not present

## 2023-06-28 DIAGNOSIS — M1711 Unilateral primary osteoarthritis, right knee: Secondary | ICD-10-CM | POA: Diagnosis not present

## 2023-06-28 DIAGNOSIS — M25661 Stiffness of right knee, not elsewhere classified: Secondary | ICD-10-CM | POA: Diagnosis not present

## 2023-06-28 DIAGNOSIS — R262 Difficulty in walking, not elsewhere classified: Secondary | ICD-10-CM | POA: Diagnosis not present

## 2023-06-30 ENCOUNTER — Telehealth: Payer: Self-pay | Admitting: Physician Assistant

## 2023-06-30 NOTE — Telephone Encounter (Signed)
 Rescheduled appointment per provider on PAL. The patient is aware of the appointment changes and will be mailed appointment reminders.

## 2023-07-02 DIAGNOSIS — M25661 Stiffness of right knee, not elsewhere classified: Secondary | ICD-10-CM | POA: Diagnosis not present

## 2023-07-02 DIAGNOSIS — M6281 Muscle weakness (generalized): Secondary | ICD-10-CM | POA: Diagnosis not present

## 2023-07-02 DIAGNOSIS — R262 Difficulty in walking, not elsewhere classified: Secondary | ICD-10-CM | POA: Diagnosis not present

## 2023-07-02 DIAGNOSIS — M1711 Unilateral primary osteoarthritis, right knee: Secondary | ICD-10-CM | POA: Diagnosis not present

## 2023-07-05 DIAGNOSIS — M25661 Stiffness of right knee, not elsewhere classified: Secondary | ICD-10-CM | POA: Diagnosis not present

## 2023-07-05 DIAGNOSIS — R262 Difficulty in walking, not elsewhere classified: Secondary | ICD-10-CM | POA: Diagnosis not present

## 2023-07-05 DIAGNOSIS — M1711 Unilateral primary osteoarthritis, right knee: Secondary | ICD-10-CM | POA: Diagnosis not present

## 2023-07-05 DIAGNOSIS — M6281 Muscle weakness (generalized): Secondary | ICD-10-CM | POA: Diagnosis not present

## 2023-07-08 DIAGNOSIS — M6281 Muscle weakness (generalized): Secondary | ICD-10-CM | POA: Diagnosis not present

## 2023-07-08 DIAGNOSIS — M25661 Stiffness of right knee, not elsewhere classified: Secondary | ICD-10-CM | POA: Diagnosis not present

## 2023-07-08 DIAGNOSIS — R262 Difficulty in walking, not elsewhere classified: Secondary | ICD-10-CM | POA: Diagnosis not present

## 2023-07-08 DIAGNOSIS — M1711 Unilateral primary osteoarthritis, right knee: Secondary | ICD-10-CM | POA: Diagnosis not present

## 2023-07-13 ENCOUNTER — Ambulatory Visit (HOSPITAL_COMMUNITY)
Admission: RE | Admit: 2023-07-13 | Discharge: 2023-07-13 | Disposition: A | Discharge status: Home / Self Care | Source: Ambulatory Visit | Attending: Internal Medicine | Admitting: Internal Medicine

## 2023-07-13 ENCOUNTER — Inpatient Hospital Stay: Payer: Medicare Other | Attending: Physician Assistant

## 2023-07-13 DIAGNOSIS — J90 Pleural effusion, not elsewhere classified: Secondary | ICD-10-CM | POA: Diagnosis not present

## 2023-07-13 DIAGNOSIS — M1711 Unilateral primary osteoarthritis, right knee: Secondary | ICD-10-CM | POA: Diagnosis not present

## 2023-07-13 DIAGNOSIS — J432 Centrilobular emphysema: Secondary | ICD-10-CM | POA: Diagnosis not present

## 2023-07-13 DIAGNOSIS — C3490 Malignant neoplasm of unspecified part of unspecified bronchus or lung: Secondary | ICD-10-CM | POA: Diagnosis not present

## 2023-07-13 DIAGNOSIS — R918 Other nonspecific abnormal finding of lung field: Secondary | ICD-10-CM | POA: Insufficient documentation

## 2023-07-13 DIAGNOSIS — Z9221 Personal history of antineoplastic chemotherapy: Secondary | ICD-10-CM | POA: Insufficient documentation

## 2023-07-13 DIAGNOSIS — C3431 Malignant neoplasm of lower lobe, right bronchus or lung: Secondary | ICD-10-CM | POA: Insufficient documentation

## 2023-07-13 DIAGNOSIS — I700 Atherosclerosis of aorta: Secondary | ICD-10-CM | POA: Diagnosis not present

## 2023-07-13 DIAGNOSIS — M6281 Muscle weakness (generalized): Secondary | ICD-10-CM | POA: Diagnosis not present

## 2023-07-13 DIAGNOSIS — R262 Difficulty in walking, not elsewhere classified: Secondary | ICD-10-CM | POA: Diagnosis not present

## 2023-07-13 DIAGNOSIS — Z923 Personal history of irradiation: Secondary | ICD-10-CM | POA: Insufficient documentation

## 2023-07-13 DIAGNOSIS — M25661 Stiffness of right knee, not elsewhere classified: Secondary | ICD-10-CM | POA: Diagnosis not present

## 2023-07-13 LAB — CBC WITH DIFFERENTIAL (CANCER CENTER ONLY)
Abs Immature Granulocytes: 0.05 10*3/uL (ref 0.00–0.07)
Basophils Absolute: 0.1 10*3/uL (ref 0.0–0.1)
Basophils Relative: 1 %
Eosinophils Absolute: 0.2 10*3/uL (ref 0.0–0.5)
Eosinophils Relative: 2 %
HCT: 38.9 % — ABNORMAL LOW (ref 39.0–52.0)
Hemoglobin: 12.7 g/dL — ABNORMAL LOW (ref 13.0–17.0)
Immature Granulocytes: 1 %
Lymphocytes Relative: 22 %
Lymphs Abs: 1.5 10*3/uL (ref 0.7–4.0)
MCH: 32 pg (ref 26.0–34.0)
MCHC: 32.6 g/dL (ref 30.0–36.0)
MCV: 98 fL (ref 80.0–100.0)
Monocytes Absolute: 0.7 10*3/uL (ref 0.1–1.0)
Monocytes Relative: 10 %
Neutro Abs: 4.4 10*3/uL (ref 1.7–7.7)
Neutrophils Relative %: 64 %
Platelet Count: 200 10*3/uL (ref 150–400)
RBC: 3.97 MIL/uL — ABNORMAL LOW (ref 4.22–5.81)
RDW: 13.9 % (ref 11.5–15.5)
WBC Count: 6.9 10*3/uL (ref 4.0–10.5)
nRBC: 0 % (ref 0.0–0.2)

## 2023-07-13 LAB — CMP (CANCER CENTER ONLY)
ALT: 18 U/L (ref 0–44)
AST: 21 U/L (ref 15–41)
Albumin: 4.2 g/dL (ref 3.5–5.0)
Alkaline Phosphatase: 77 U/L (ref 38–126)
Anion gap: 6 (ref 5–15)
BUN: 15 mg/dL (ref 8–23)
CO2: 27 mmol/L (ref 22–32)
Calcium: 9.5 mg/dL (ref 8.9–10.3)
Chloride: 101 mmol/L (ref 98–111)
Creatinine: 1.03 mg/dL (ref 0.61–1.24)
GFR, Estimated: >60 mL/min (ref >60)
Glucose, Bld: 88 mg/dL (ref 70–99)
Potassium: 4.7 mmol/L (ref 3.5–5.1)
Sodium: 134 mmol/L — ABNORMAL LOW (ref 135–145)
Total Bilirubin: 0.8 mg/dL (ref 0.0–1.2)
Total Protein: 7 g/dL (ref 6.5–8.1)

## 2023-07-13 MED ORDER — IOHEXOL 300 MG/ML  SOLN
75.0000 mL | Freq: Once | INTRAMUSCULAR | Status: AC | PRN
  Administered 2023-07-13: 75 mL via INTRAVENOUS

## 2023-07-13 MED ORDER — SODIUM CHLORIDE (PF) 0.9 % IJ SOLN
INTRAMUSCULAR | Status: AC
  Filled 2023-07-13: qty 50

## 2023-07-13 MED ORDER — HEPARIN SOD (PORK) LOCK FLUSH 100 UNIT/ML IV SOLN
INTRAVENOUS | Status: AC
  Filled 2023-07-13: qty 5

## 2023-07-13 MED ORDER — HEPARIN SOD (PORK) LOCK FLUSH 100 UNIT/ML IV SOLN
500.0000 [IU] | Freq: Once | INTRAVENOUS | Status: AC
Start: 2023-07-13 — End: 2023-07-13
  Administered 2023-07-13: 500 [IU] via INTRAVENOUS

## 2023-07-13 NOTE — Progress Notes (Signed)
 [facility] OFFICE PROGRESS NOTE  Almira Jaeger, MD [address] [place] [address]  DIAGNOSIS: Stage IIIb (T3, N2, M0) non-small cell lung cancer, squamous cell carcinoma presented with right lower lobe lung mass in addition to subcarinal lymphadenopathy diagnosed in October 2020.    PRIOR THERAPY: 1) Weekly concurrent chemoradiation with Carboplatin  for an AUC of 2 Paclitaxel  45 mg/m2. First dose 01/16/2019. Status post 5 cycles.  Last dose was given February 13, 2019. 2) Consolidation treatment with immunotherapy with Imfinzi  1500 mg IV every 4 weeks.  First dose April 05, 2019.  Status post 5 cycles.  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  CURRENT THERAPY: Observation   INTERVAL HISTORY: Martin Bailey 83 y.o. male returns to the clinic today for a follow-up visit. The patient was last seen October 2024 by Dr. Marguerita Shih.  The patient was diagnosed with stage III lung cancer in 2020.  He underwent concurrent chemoradiation.  He then underwent 5 cycles of consolidation immunotherapy with Imfinzi  but this was discontinued secondary to pneumonitis. He has been on observation since May 2021 and feeling fine. The patient denies any major changes in his health except he recently had a knee replacement 4 weeks ago. He has tolerated this extremely well. He is very motivated to get back to the gym and golf in the next few months. He states that his physical therapist told him he is ahead of schedule.    He denies any fever, chills, night sweats, or unexplained weight loss.  He denies any chest pain, cough, or hemoptysis. He states his breathing is "good". Since he has not been as active in the gym as he had been previously, he has not been exerting himself very much and has not noticed any appreciable shortness of breath unless he is climbing stairs. He denies any recent URI. He denies any nausea, vomiting, diarrhea, or  constipation.  Denies any headache or visual changes.  He recently had a restaging CT scan performed.  He is here today for evaluation and to review his scan results.  MEDICAL HISTORY: Past Medical History:  Diagnosis Date   Arthritis    Cataract    left   COLONIC POLYPS, HX OF 04/05/2007   ONE ADENOMATOUS POLYP AND ONE HYPERPLASTIC POLYP   DEPRESSION 03/04/2007   DERMATITIS, SEBORRHEIC 07/19/2007   Dysrhythmia    FIBRILLATION, ATRIAL 03/04/2007   Hearing aid worn    B/L   History of kidney stones    HYPERLIPIDEMIA 03/04/2007   HYPERTENSION 03/04/2007   Lung cancer (HCC) dx'd 02/2019   non small cell lung ca   Lung mass    mediastinal adenopathy    ALLERGIES:  is allergic to hydrocodone .  MEDICATIONS:  Current Outpatient Medications  Medication Sig Dispense Refill   acetaminophen  (TYLENOL ) 500 MG tablet Take 2 tablets (1,000 mg total) by mouth every 8 (eight) hours as needed.     amLODipine  (NORVASC ) 5 MG tablet TAKE 1 TABLET BY MOUTH DAILY 100 tablet 2   atenolol  (TENORMIN ) 50 MG tablet TAKE 1 TABLET BY MOUTH TWICE  DAILY 200 tablet 2   atorvastatin  (LIPITOR) 10 MG tablet TAKE 1 TABLET BY MOUTH DAILY 100 tablet 2   chlorhexidine  (HIBICLENS ) 4 % external liquid Apply 15 mLs (1 Application total) topically as directed for 30 doses. Use as directed daily for 5 days every other week for 6 weeks. 946 mL 1   Multiple Vitamin (MULTIVITAMIN WITH MINERALS) TABS tablet Take  1 tablet by mouth daily.     Multiple Vitamins-Minerals (PRESERVISION AREDS 2) CAPS Take 1 capsule by mouth 2 (two) times daily.     mupirocin  ointment (BACTROBAN ) 2 % Place 1 Application into the nose 2 (two) times daily for 60 doses. Use as directed 2 times daily for 5 days every other week for 6 weeks. 60 g 0   rivaroxaban  (XARELTO ) 20 MG TABS tablet Take 1 tablet (20 mg total) by mouth daily with supper. You are to resume your Xarelto  after surgery to help reduce your risk of getting a blood clot.  For the first  two days after surgery, take a half a dose (10 mg Xarelto ) once a day.  Then on the third day, resume your full dose (20 mg Xarelto ) once a day.     No current facility-administered medications for this visit.    SURGICAL HISTORY:  Past Surgical History:  Procedure Laterality Date   BIOPSY  01/09/2020   Procedure: BIOPSY;  Surgeon: Margaretann Sharper, MD;  Location: [facility];  Service: Endoscopy;;   BRONCHIAL WASHINGS  01/09/2020   Procedure: BRONCHIAL WASHINGS;  Surgeon: Margaretann Sharper, MD;  Location: [facility];  Service: Endoscopy;;   CATARACT EXTRACTION W/ INTRAOCULAR LENS IMPLANT     right   COLONOSCOPY     IR IMAGING GUIDED PORT INSERTION  01/31/2019   PARTIAL KNEE ARTHROPLASTY Right 06/23/2023   Procedure: UNICOMPARTMENTAL KNEE-MEDIAL;  Surgeon: Murleen Arms, MD;  Location: [facility];  Service: Orthopedics;  Laterality: Right;   RADIOFREQUENCY ABLATION     x2 for fib, on 2nd attempt switched form a flutter ot a fib.    ROTATOR CUFF REPAIR Right    TONSILLECTOMY     VIDEO BRONCHOSCOPY N/A 01/09/2020   Procedure: VIDEO BRONCHOSCOPY WITH FLUORO;  Surgeon: Margaretann Sharper, MD;  Location: [facility];  Service: Endoscopy;  Laterality: N/A;   VIDEO BRONCHOSCOPY WITH ENDOBRONCHIAL ULTRASOUND Left 12/30/2018   Procedure: VIDEO BRONCHOSCOPY WITH ENDOBRONCHIAL ULTRASOUND WITH FLUOROSCOPY;  Surgeon: Margaretann Sharper, MD;  Location: [facility];  Service: Thoracic;  Laterality: Left;   WISDOM TOOTH EXTRACTION      REVIEW OF SYSTEMS:   Review of Systems  Constitutional: Negative for appetite change, chills, fatigue, fever and unexpected weight change.  HENT:   Negative for mouth sores, nosebleeds, sore throat and trouble swallowing.   Eyes: Negative for eye problems and icterus.  Respiratory: Negative for cough, hemoptysis, shortness of breath and wheezing.   Cardiovascular: Negative for chest pain and leg swelling.  Gastrointestinal: Negative for abdominal pain,  constipation, diarrhea, nausea and vomiting.  Genitourinary: Negative for bladder incontinence, difficulty urinating, dysuria, frequency and hematuria.   Musculoskeletal: Negative for back pain, gait problem, neck pain and neck stiffness.  Skin: Negative for itching and rash.  Neurological: Negative for dizziness, extremity weakness, gait problem, headaches, light-headedness and seizures.  Hematological: Negative for adenopathy. Does not bruise/bleed easily.  Psychiatric/Behavioral: Negative for confusion, depression and sleep disturbance. The patient is not nervous/anxious.     PHYSICAL EXAMINATION:  Blood pressure 132/74, pulse (!) 58, temperature (!) 97.4 F (36.3 C), temperature source Temporal, resp. rate 14, weight 214 lb 4.8 oz (97.2 kg), SpO2 99%.  ECOG PERFORMANCE STATUS: 0  Physical Exam  Constitutional: Oriented to person, place, and time and well-developed, well-nourished, and in no distress.  HENT:  Head: Normocephalic and atraumatic.  Mouth/Throat: Oropharynx is clear and moist. No oropharyngeal exudate.  Eyes: Conjunctivae are normal. Right eye exhibits no discharge.  Left eye exhibits no discharge. No scleral icterus.  Neck: Normal range of motion. Neck supple.  Cardiovascular: Normal rate, regular rhythm, normal heart sounds and intact distal pulses.   Pulmonary/Chest: Effort normal and breath sounds normal. No respiratory distress. No wheezes. No rales.  Abdominal: Soft. Bowel sounds are normal. Exhibits no distension and no mass. There is no tenderness.  Musculoskeletal: Normal range of motion. Exhibits no edema.  Lymphadenopathy:    No cervical adenopathy.  Neurological: Alert and oriented to person, place, and time. Exhibits normal muscle tone. Gait normal. Coordination normal. Ambulates with a cane.  Skin: His incision appears to be healing well. Skin is warm and dry. No rash noted. Not diaphoretic. No erythema. No pallor.  Psychiatric: Mood, memory and judgment  normal.  Vitals reviewed.  LABORATORY DATA: Lab Results  Component Value Date   WBC 6.9 07/13/2023   HGB 12.7 (L) 07/13/2023   HCT 38.9 (L) 07/13/2023   MCV 98.0 07/13/2023   PLT 200 07/13/2023      Chemistry      Component Value Date/Time   NA 134 (L) 07/13/2023 1023   K 4.7 07/13/2023 1023   CL 101 07/13/2023 1023   CO2 27 07/13/2023 1023   BUN 15 07/13/2023 1023   CREATININE 1.03 07/13/2023 1023      Component Value Date/Time   CALCIUM  9.5 07/13/2023 1023   ALKPHOS 77 07/13/2023 1023   AST 21 07/13/2023 1023   ALT 18 07/13/2023 1023   BILITOT 0.8 07/13/2023 1023       RADIOGRAPHIC STUDIES:  CT Chest W Contrast Result Date: 07/17/2023 CLINICAL DATA:  Non-small-cell lung cancer. Restaging. * Tracking Code: BO * EXAM: CT CHEST WITH CONTRAST TECHNIQUE: Multidetector CT imaging of the chest was performed during intravenous contrast administration. RADIATION DOSE REDUCTION: This exam was performed according to the departmental dose-optimization program which includes automated exposure control, adjustment of the mA and/or kV according to patient size and/or use of iterative reconstruction technique. CONTRAST:  75mL OMNIPAQUE  IOHEXOL  300 MG/ML  SOLN COMPARISON:  01/08/2023 FINDINGS: Cardiovascular: The heart size is normal. No substantial pericardial effusion. Coronary artery calcification is evident. Mild atherosclerotic calcification is noted in the wall of the thoracic aorta. Right-sided Port-A-Cath tip is positioned in the distal SVC. Mediastinum/Nodes: No mediastinal lymphadenopathy. There is no hilar lymphadenopathy. The esophagus has normal imaging features. There is no axillary lymphadenopathy. Lungs/Pleura: Centrilobular and paraseptal emphysema evident. Post treatment scarring in the right infrahilar and retro hilar lung is associated with volume loss. Imaging features generally stable in the interval. There is a new 2.0 x 1.1 cm ground-glass opacity in the posterior right  lung on image 74/8. No new suspicious pulmonary nodule or mass in the left lung. Tiny right pleural effusion is similar to prior. Upper Abdomen: Visualized portion of the upper abdomen shows no acute findings. Musculoskeletal: Multiple nonacute left-sided rib fractures evident and while nonacute fractures in the posterior left third through fifth ribs are new since prior study. Stable superior endplate compression deformity at T7. Mild compression deformity at T11 is unchanged. IMPRESSION: 1. Stable post treatment scarring in the right infrahilar and retro hilar lung with associated volume loss. 2. New 2.0 x 1.1 cm ground-glass opacity in the posterior right lung. Imaging features are nonspecific and may be infectious/inflammatory. Close follow-up recommended to ensure resolution. 3. Tiny right pleural effusion, similar to prior. 4. Nonacute fractures in the posterior left third through fifth ribs are new since the prior study. 5.  Emphysema (ICD10-J43.9)  and Aortic Atherosclerosis (ICD10-170.0) Electronically Signed   By: Donnal Fusi M.D.   On: 07/17/2023 06:57   DG Knee Right Port Result Date: 06/23/2023 CLINICAL DATA:  Postoperative state.  Unicompartmental knee surgery. EXAM: PORTABLE RIGHT KNEE - 1-2 VIEW COMPARISON:  None Available. FINDINGS: Right medial compartment hemiarthroplasty in expected alignment. No periprosthetic lucency or fracture. There has been patellar resurfacing. Recent postsurgical change includes air and edema in the soft tissues and joint space. IMPRESSION: Right medial compartment hemiarthroplasty without immediate postoperative complication. Electronically Signed   By: Chadwick Colonel M.D.   On: 06/23/2023 15:29     ASSESSMENT/PLAN:  This is a very pleasant 83 year old Caucasian male with a history of stage IIIb non-small cell lung cancer, squamous cell carcinoma.  He presented with a right lower lobe lung mass in addition to subcarinal lymphadenopathy.  He was diagnosed in  October 2020. He is negative for any actionable mutations.   Patient underwent concurrent chemoradiation with carboplatin  for an AUC of 2 and paclitaxel  45 mg/m.  He is status post 5 cycles.  Chemotherapy was held with the last week secondary to thrombocytopenia.  He then underwent 5 cycles of consolidation immunotherapy with Imfinzi  but this was discontinued in May 2021 due to him pneumonitis.  He has been on observation since that time and feeling well.  The patient was seen with Dr. Marguerita Shih today.  Dr. Marguerita Shih personally and independently reviewed the scan and discussed results with the patient today.  The scan showed no evidence of diease progression except there is a new 2.0 x 1.1 cm ground glass opacity, which could be infectious/inflammatory. Dr. Marguerita Shih recommends close monitoring of this.  Dr. Marguerita Shih recommends a restaging CT scan in 6 months.   I will arrange for restaging CT scan to be performed in 6 months.  We will see him back 7 to 10 days later to review the results in the office.  The patient has a port-a-cath. This was last used on 07/13/23 for his scan. I let him know we should be flushing this every 6-8 weeks. Therefore, I will request flush appointments every 6-8 weeks on my checkout instructions.   The patient was advised to call immediately if he has any concerning symptoms in the interval. The patient voices understanding of current disease status and treatment options and is in agreement with the current care plan. All questions were answered. The patient knows to call the clinic with any problems, questions or concerns. We can certainly see the patient much sooner if necessary    Orders Placed This Encounter  Procedures   CT Chest W Contrast    Standing Status:   Future    Expected Date:   01/19/2024    Expiration Date:   07/19/2024    If indicated for the ordered procedure, I authorize the administration of contrast media per Radiology protocol:   Yes    Does the  patient have a contrast media/X-ray dye allergy?:   No    Preferred imaging location?:   [facility]   CBC (Cancer Center Only)    Standing Status:   Future    Expected Date:   01/19/2024    Expiration Date:   07/19/2024   CMP (Cancer Center only)    Standing Status:   Future    Expected Date:   01/19/2024    Expiration Date:   07/19/2024     Leita Purdue Nattie Lazenby, PA-C 07/20/23  ADDENDUM: Hematology/Oncology Attending: I had a face-to-face encounter  with the patient today.  I reviewed his record, lab, scan and recommended his care plan.  This is a very pleasant 83 years old white male with history of stage IIIb non-small cell lung cancer, squamous cell carcinoma diagnosed in October 2020.  He is status post a course of concurrent chemoradiation with weekly carboplatin  and paclitaxel  followed by 5 cycles of consolidation treatment with immunotherapy with Imfinzi  that was discontinued secondary to suspicious immunotherapy mediated pneumonitis and the patient has been on observation now for several years.  The patient is doing fine and recovering well from recent right knee surgery.  He had repeat CT scan of the chest performed recently.  I personally and independently reviewed the scan and discussed the result with the patient today.  His scan showed no concerning findings for disease progression but there was new 2.0 x 1.1 cm groundglass opacity in the posterior right lung that is nonspecific and likely infectious/inflammatory in origin. I recommended for the patient to continue on observation with repeat CT scan of the chest in 6 months. The patient was advised to call immediately if she has any other concerning symptoms in the interval. The total time spent in the appointment was 20 minutes. Disclaimer: This note was dictated with voice recognition software. Similar sounding words can inadvertently be transcribed and may be missed upon review. Aurelio Blower, MD

## 2023-07-15 DIAGNOSIS — M25661 Stiffness of right knee, not elsewhere classified: Secondary | ICD-10-CM | POA: Diagnosis not present

## 2023-07-15 DIAGNOSIS — R262 Difficulty in walking, not elsewhere classified: Secondary | ICD-10-CM | POA: Diagnosis not present

## 2023-07-15 DIAGNOSIS — M1711 Unilateral primary osteoarthritis, right knee: Secondary | ICD-10-CM | POA: Diagnosis not present

## 2023-07-15 DIAGNOSIS — M6281 Muscle weakness (generalized): Secondary | ICD-10-CM | POA: Diagnosis not present

## 2023-07-19 DIAGNOSIS — M6281 Muscle weakness (generalized): Secondary | ICD-10-CM | POA: Diagnosis not present

## 2023-07-19 DIAGNOSIS — R262 Difficulty in walking, not elsewhere classified: Secondary | ICD-10-CM | POA: Diagnosis not present

## 2023-07-19 DIAGNOSIS — M25661 Stiffness of right knee, not elsewhere classified: Secondary | ICD-10-CM | POA: Diagnosis not present

## 2023-07-19 DIAGNOSIS — M1711 Unilateral primary osteoarthritis, right knee: Secondary | ICD-10-CM | POA: Diagnosis not present

## 2023-07-20 ENCOUNTER — Encounter: Payer: Self-pay | Admitting: Internal Medicine

## 2023-07-20 ENCOUNTER — Inpatient Hospital Stay: Admitting: Physician Assistant

## 2023-07-20 ENCOUNTER — Ambulatory Visit: Payer: Medicare Other | Admitting: Internal Medicine

## 2023-07-20 VITALS — BP 132/74 | HR 58 | Temp 97.4°F | Resp 14 | Wt 214.3 lb

## 2023-07-20 DIAGNOSIS — C3491 Malignant neoplasm of unspecified part of right bronchus or lung: Secondary | ICD-10-CM | POA: Diagnosis not present

## 2023-07-20 DIAGNOSIS — Z9221 Personal history of antineoplastic chemotherapy: Secondary | ICD-10-CM | POA: Diagnosis not present

## 2023-07-20 DIAGNOSIS — C3431 Malignant neoplasm of lower lobe, right bronchus or lung: Secondary | ICD-10-CM | POA: Diagnosis not present

## 2023-07-20 DIAGNOSIS — Z923 Personal history of irradiation: Secondary | ICD-10-CM | POA: Diagnosis not present

## 2023-07-20 DIAGNOSIS — R918 Other nonspecific abnormal finding of lung field: Secondary | ICD-10-CM | POA: Diagnosis not present

## 2023-07-21 ENCOUNTER — Encounter: Payer: Self-pay | Admitting: Internal Medicine

## 2023-07-22 DIAGNOSIS — M6281 Muscle weakness (generalized): Secondary | ICD-10-CM | POA: Diagnosis not present

## 2023-07-22 DIAGNOSIS — M1711 Unilateral primary osteoarthritis, right knee: Secondary | ICD-10-CM | POA: Diagnosis not present

## 2023-07-22 DIAGNOSIS — R262 Difficulty in walking, not elsewhere classified: Secondary | ICD-10-CM | POA: Diagnosis not present

## 2023-07-22 DIAGNOSIS — M25661 Stiffness of right knee, not elsewhere classified: Secondary | ICD-10-CM | POA: Diagnosis not present

## 2023-07-26 DIAGNOSIS — M25661 Stiffness of right knee, not elsewhere classified: Secondary | ICD-10-CM | POA: Diagnosis not present

## 2023-07-26 DIAGNOSIS — M6281 Muscle weakness (generalized): Secondary | ICD-10-CM | POA: Diagnosis not present

## 2023-07-26 DIAGNOSIS — R262 Difficulty in walking, not elsewhere classified: Secondary | ICD-10-CM | POA: Diagnosis not present

## 2023-07-26 DIAGNOSIS — M1711 Unilateral primary osteoarthritis, right knee: Secondary | ICD-10-CM | POA: Diagnosis not present

## 2023-07-28 ENCOUNTER — Other Ambulatory Visit: Payer: Self-pay | Admitting: Family Medicine

## 2023-07-29 DIAGNOSIS — M25661 Stiffness of right knee, not elsewhere classified: Secondary | ICD-10-CM | POA: Diagnosis not present

## 2023-07-29 DIAGNOSIS — M6281 Muscle weakness (generalized): Secondary | ICD-10-CM | POA: Diagnosis not present

## 2023-07-29 DIAGNOSIS — R262 Difficulty in walking, not elsewhere classified: Secondary | ICD-10-CM | POA: Diagnosis not present

## 2023-07-29 DIAGNOSIS — M1711 Unilateral primary osteoarthritis, right knee: Secondary | ICD-10-CM | POA: Diagnosis not present

## 2023-08-03 DIAGNOSIS — M25661 Stiffness of right knee, not elsewhere classified: Secondary | ICD-10-CM | POA: Diagnosis not present

## 2023-08-03 DIAGNOSIS — M6281 Muscle weakness (generalized): Secondary | ICD-10-CM | POA: Diagnosis not present

## 2023-08-03 DIAGNOSIS — R262 Difficulty in walking, not elsewhere classified: Secondary | ICD-10-CM | POA: Diagnosis not present

## 2023-08-03 DIAGNOSIS — M1711 Unilateral primary osteoarthritis, right knee: Secondary | ICD-10-CM | POA: Diagnosis not present

## 2023-08-05 DIAGNOSIS — M1711 Unilateral primary osteoarthritis, right knee: Secondary | ICD-10-CM | POA: Diagnosis not present

## 2023-08-10 DIAGNOSIS — M1711 Unilateral primary osteoarthritis, right knee: Secondary | ICD-10-CM | POA: Diagnosis not present

## 2023-08-10 DIAGNOSIS — M25661 Stiffness of right knee, not elsewhere classified: Secondary | ICD-10-CM | POA: Diagnosis not present

## 2023-08-10 DIAGNOSIS — R262 Difficulty in walking, not elsewhere classified: Secondary | ICD-10-CM | POA: Diagnosis not present

## 2023-08-10 DIAGNOSIS — M6281 Muscle weakness (generalized): Secondary | ICD-10-CM | POA: Diagnosis not present

## 2023-08-31 ENCOUNTER — Inpatient Hospital Stay: Attending: Physician Assistant

## 2023-08-31 DIAGNOSIS — C3431 Malignant neoplasm of lower lobe, right bronchus or lung: Secondary | ICD-10-CM | POA: Diagnosis not present

## 2023-08-31 DIAGNOSIS — Z5112 Encounter for antineoplastic immunotherapy: Secondary | ICD-10-CM

## 2023-08-31 MED ORDER — SODIUM CHLORIDE 0.9% FLUSH
10.0000 mL | Freq: Once | INTRAVENOUS | Status: AC
  Administered 2023-08-31: 10 mL

## 2023-08-31 MED ORDER — HEPARIN SOD (PORK) LOCK FLUSH 100 UNIT/ML IV SOLN
500.0000 [IU] | Freq: Once | INTRAVENOUS | Status: AC | PRN
  Administered 2023-08-31: 500 [IU]

## 2023-09-20 ENCOUNTER — Ambulatory Visit: Payer: Self-pay | Admitting: Family Medicine

## 2023-09-20 ENCOUNTER — Encounter: Payer: Self-pay | Admitting: Family Medicine

## 2023-09-20 ENCOUNTER — Ambulatory Visit (INDEPENDENT_AMBULATORY_CARE_PROVIDER_SITE_OTHER): Payer: Medicare Other | Admitting: Family Medicine

## 2023-09-20 VITALS — BP 120/60 | HR 69 | Temp 97.3°F | Ht 70.0 in | Wt 209.0 lb

## 2023-09-20 DIAGNOSIS — E785 Hyperlipidemia, unspecified: Secondary | ICD-10-CM | POA: Diagnosis not present

## 2023-09-20 DIAGNOSIS — Z131 Encounter for screening for diabetes mellitus: Secondary | ICD-10-CM

## 2023-09-20 DIAGNOSIS — I4821 Permanent atrial fibrillation: Secondary | ICD-10-CM

## 2023-09-20 DIAGNOSIS — E663 Overweight: Secondary | ICD-10-CM

## 2023-09-20 DIAGNOSIS — H6123 Impacted cerumen, bilateral: Secondary | ICD-10-CM | POA: Diagnosis not present

## 2023-09-20 DIAGNOSIS — R7989 Other specified abnormal findings of blood chemistry: Secondary | ICD-10-CM | POA: Diagnosis not present

## 2023-09-20 DIAGNOSIS — Z0000 Encounter for general adult medical examination without abnormal findings: Secondary | ICD-10-CM | POA: Diagnosis not present

## 2023-09-20 DIAGNOSIS — I10 Essential (primary) hypertension: Secondary | ICD-10-CM | POA: Diagnosis not present

## 2023-09-20 DIAGNOSIS — F1020 Alcohol dependence, uncomplicated: Secondary | ICD-10-CM

## 2023-09-20 LAB — HEMOGLOBIN A1C: Hgb A1c MFr Bld: 5.7 % (ref 4.6–6.5)

## 2023-09-20 LAB — CBC WITH DIFFERENTIAL/PLATELET
Basophils Absolute: 0 10*3/uL (ref 0.0–0.1)
Basophils Relative: 0.6 % (ref 0.0–3.0)
Eosinophils Absolute: 0.3 10*3/uL (ref 0.0–0.7)
Eosinophils Relative: 3.7 % (ref 0.0–5.0)
HCT: 37.9 % — ABNORMAL LOW (ref 39.0–52.0)
Hemoglobin: 12.8 g/dL — ABNORMAL LOW (ref 13.0–17.0)
Lymphocytes Relative: 19 % (ref 12.0–46.0)
Lymphs Abs: 1.4 10*3/uL (ref 0.7–4.0)
MCHC: 33.7 g/dL (ref 30.0–36.0)
MCV: 90.4 fl (ref 78.0–100.0)
Monocytes Absolute: 0.9 10*3/uL (ref 0.1–1.0)
Monocytes Relative: 11.9 % (ref 3.0–12.0)
Neutro Abs: 4.9 10*3/uL (ref 1.4–7.7)
Neutrophils Relative %: 64.8 % (ref 43.0–77.0)
Platelets: 214 10*3/uL (ref 150.0–400.0)
RBC: 4.19 Mil/uL — ABNORMAL LOW (ref 4.22–5.81)
RDW: 14.2 % (ref 11.5–15.5)
WBC: 7.5 10*3/uL (ref 4.0–10.5)

## 2023-09-20 LAB — LIPID PANEL
Cholesterol: 124 mg/dL (ref 0–200)
HDL: 47.4 mg/dL (ref >39.00)
LDL Cholesterol: 66 mg/dL (ref 0–99)
NonHDL: 76.99
Total CHOL/HDL Ratio: 3
Triglycerides: 55 mg/dL (ref 0.0–149.0)
VLDL: 11 mg/dL (ref 0.0–40.0)

## 2023-09-20 LAB — COMPREHENSIVE METABOLIC PANEL WITH GFR
ALT: 15 U/L (ref 0–53)
AST: 20 U/L (ref 0–37)
Albumin: 3.9 g/dL (ref 3.5–5.2)
Alkaline Phosphatase: 64 U/L (ref 39–117)
BUN: 17 mg/dL (ref 6–23)
CO2: 22 meq/L (ref 19–32)
Calcium: 8.8 mg/dL (ref 8.4–10.5)
Chloride: 102 meq/L (ref 96–112)
Creatinine, Ser: 0.93 mg/dL (ref 0.40–1.50)
GFR: 77.08 mL/min (ref >60.00)
Glucose, Bld: 107 mg/dL — ABNORMAL HIGH (ref 70–99)
Potassium: 4.4 meq/L (ref 3.5–5.1)
Sodium: 133 meq/L — ABNORMAL LOW (ref 135–145)
Total Bilirubin: 0.7 mg/dL (ref 0.2–1.2)
Total Protein: 6.8 g/dL (ref 6.0–8.3)

## 2023-09-20 LAB — TSH: TSH: 3.29 u[IU]/mL (ref 0.35–5.50)

## 2023-09-20 NOTE — Patient Instructions (Addendum)
 Please stop by lab before you go If you have mychart- we will send your results within 3 business days of us  receiving them.  If you do not have mychart- we will call you about results within 5 business days of us  receiving them.  *please also note that you will see labs on mychart as soon as they post. I will later go in and write notes on them- will say notes from Dr. Katrinka He oil for ear full of wax OR can buy debrox OTC (available over the counter without a prescription)  Purchase mineral oil from laxative aisle Lay down on your side with ear that is bothering you facing up Use 3-4 drops with a dropper and place in ear for 30 seconds Place cotton swab outside of ear Turn to other side and allow this to drain Repeat 3-4 x a day Return to see us  if not improving within a few days  Recommended follow up: Return in about 6 months (around 03/21/2024) for followup or sooner if needed.Schedule b4 you leave.

## 2023-09-20 NOTE — Progress Notes (Signed)
 Phone: [phone]   Subjective:  Patient presents today for their annual physical. Chief complaint-noted.   See problem oriented charting- ROS- full  review of systems was completed and negative  Per full ROS sheet completed by patient except for topics noted under acute/chronic concerns  The following were reviewed and entered/updated in epic: Past Medical History:  Diagnosis Date   Arthritis    Cataract    left   COLONIC POLYPS, HX OF 04/05/2007   ONE ADENOMATOUS POLYP AND ONE HYPERPLASTIC POLYP   DEPRESSION 03/04/2007   DERMATITIS, SEBORRHEIC 07/19/2007   Dysrhythmia    FIBRILLATION, ATRIAL 03/04/2007   Hearing aid worn    B/L   History of kidney stones    HYPERLIPIDEMIA 03/04/2007   HYPERTENSION 03/04/2007   Lung cancer (HCC)    non small cell lung ca   Lung mass    mediastinal adenopathy   Patient Active Problem List   Diagnosis Date Noted   Elevated coronary artery calcium  score 05/02/2020    Priority: High   Stage III squamous cell carcinoma of right lung (HCC) 01/06/2019    Priority: High   Alcoholism (HCC) 06/21/2014    Priority: High   Atrial fibrillation (HCC) 03/04/2007    Priority: High   Elevated TSH 09/21/2014    Priority: Medium    Seborrheic dermatitis 07/19/2007    Priority: Medium    Hyperlipidemia 03/04/2007    Priority: Medium    Major depression (HCC) 03/04/2007    Priority: Medium    Essential hypertension 03/04/2007    Priority: Medium    Former smoker 06/21/2014    Priority: Low   Allergic rhinitis 04/06/2014    Priority: Low   Hx of adenomatous colonic polyps 03/05/2014    Priority: Low   Chronic anticoagulation 03/05/2014    Priority: Low   Testosterone  deficiency 09/02/2010    Priority: Low   Recurrent major depressive disorder, in full remission (HCC) 09/15/2022   Abnormal findings on diagnostic imaging of lung 12/15/2019   Drug-induced pneumonitis 09/19/2019   Encounter for antineoplastic immunotherapy 03/29/2019    Goals of care, counseling/discussion 01/06/2019   Overweight 08/09/2017   Past Surgical History:  Procedure Laterality Date   BIOPSY  01/09/2020   Procedure: BIOPSY;  Surgeon: Neda Jennet LABOR, MD;  Location: [facility];  Service: Endoscopy;;   BRONCHIAL WASHINGS  01/09/2020   Procedure: BRONCHIAL WASHINGS;  Surgeon: Neda Jennet LABOR, MD;  Location: [facility];  Service: Endoscopy;;   CATARACT EXTRACTION W/ INTRAOCULAR LENS IMPLANT     right   COLONOSCOPY     IR IMAGING GUIDED PORT INSERTION  01/31/2019   JOINT REPLACEMENT  06/23/2023   PARTIAL KNEE ARTHROPLASTY Right 06/23/2023   Procedure: UNICOMPARTMENTAL KNEE-MEDIAL;  Surgeon: [name] Toribio LABOR, MD;  Location: [facility];  Service: Orthopedics;  Laterality: Right;   RADIOFREQUENCY ABLATION     x2 for fib, on 2nd attempt switched form a flutter ot a fib.    ROTATOR CUFF REPAIR Right    TONSILLECTOMY     VIDEO BRONCHOSCOPY N/A 01/09/2020   Procedure: VIDEO BRONCHOSCOPY WITH FLUORO;  Surgeon: Neda Jennet LABOR, MD;  Location: [facility];  Service: Endoscopy;  Laterality: N/A;   VIDEO BRONCHOSCOPY WITH ENDOBRONCHIAL ULTRASOUND Left 12/30/2018   Procedure: VIDEO BRONCHOSCOPY WITH ENDOBRONCHIAL ULTRASOUND WITH FLUOROSCOPY;  Surgeon: Neda Jennet LABOR, MD;  Location: [facility];  Service: Thoracic;  Laterality: Left;   WISDOM TOOTH EXTRACTION      Family History  Problem Relation Age of Onset  Osteoporosis Mother    Dementia Mother    Lung cancer Father        former smoker   Cancer Father    Colon cancer Neg Hx    Stomach cancer Neg Hx    Esophageal cancer Neg Hx    Rectal cancer Neg Hx     Medications- reviewed and updated Current Outpatient Medications  Medication Sig Dispense Refill   amLODipine  (NORVASC ) 5 MG tablet TAKE 1 TABLET BY MOUTH DAILY 100 tablet 2   atenolol  (TENORMIN ) 50 MG tablet TAKE 1 TABLET BY MOUTH TWICE  DAILY 200 tablet 2   atorvastatin  (LIPITOR) 10 MG tablet TAKE 1 TABLET BY MOUTH DAILY 100  tablet 2   chlorhexidine  (HIBICLENS ) 4 % external liquid Apply 15 mLs (1 Application total) topically as directed for 30 doses. Use as directed daily for 5 days every other week for 6 weeks. 946 mL 1   Multiple Vitamin (MULTIVITAMIN WITH MINERALS) TABS tablet Take 1 tablet by mouth daily.     Multiple Vitamins-Minerals (PRESERVISION AREDS 2) CAPS Take 1 capsule by mouth 2 (two) times daily.     XARELTO  20 MG TABS tablet TAKE 1 TABLET BY MOUTH DAILY  WITH SUPPER 100 tablet 2   No current facility-administered medications for this visit.    Allergies-reviewed and updated Allergies  Allergen Reactions   Hydrocodone  Other (See Comments)    Lost my appetite, loss of energy     Social History   Social History Narrative   Widower-wife died 92. 1 daughter. 2 grandkids. 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       Hobbies: golf, bridge, computer, sports, woodworking   Objective  Objective:  BP 120/60   Pulse 69   Temp (!) 97.3 F (36.3 C)   Ht 5' 10 (1.778 m)   Wt 209 lb (94.8 kg)   SpO2 95%   BMI 29.99 kg/m  Gen: NAD, resting comfortably HEENT: Mucous membranes are moist. Oropharynx normal.  Impaction bilaterally with complete resolution after irrigation by staff-normal tympanic membranes bilaterally Neck: no thyromegaly CV: RRR no murmurs rubs or gallops Lungs: CTAB no crackles, wheeze, rhonchi Abdomen: soft/nontender/nondistended/normal bowel sounds. No rebound or guarding.  Ext: no edema Skin: warm, dry Neuro: grossly normal, moves all extremities, PERRLA    Assessment and Plan  83 y.o. male presenting for annual physical.  Health Maintenance counseling: 1. Anticipatory guidance: Patient counseled regarding regular dental exams -q6 months, eye exams - yearly,  avoiding smoking and second hand smoke , limiting alcohol to 2 beverages per day - see  below, no illicit drugs .   2. Risk factor reduction:  Advised patient of need for regular exercise and diet rich and fruits and vegetables to reduce risk of heart attack and stroke.  Exercise- started back about a month ago in the gym regularly- has noted deconditioning over last year and hoping to improve this.  Diet/weight management-within 3 lbs of last year- thinks can trim this off as activity picks back up- pretty stable on home scales.  Wt Readings from Last 3 Encounters:  09/20/23 209 lb (94.8 kg)  07/20/23 214 lb 4.8 oz (97.2 kg)  06/23/23 211 lb 10.3 oz (96 kg)  3. Immunizations/screenings/ancillary studies- consider fall COVID shot and flu shot Immunization History  Administered Date(s) Administered   Fluad Quad(high Dose 65+) 01/16/2019, 12/23/2021  Influenza Nasal 01/07/2020   Influenza Split 12/22/2011   Influenza Whole 01/18/2008, 01/04/2009   Influenza, High Dose Seasonal PF 01/02/2016, 01/21/2017, 02/10/2018   Influenza,inj,Quad PF,6+ Mos 12/23/2012, 02/20/2014, 12/21/2014   Influenza-Unspecified 01/07/2021, 12/23/2022   Moderna Sars-Covid-2 Vaccination 04/29/2019, 05/27/2019, 01/31/2020, 07/17/2020, 01/07/2021   PFIZER Comirnaty(Gray Top)Covid-19 Tri-Sucrose Vaccine 12/23/2021   PFIZER(Purple Top)SARS-COV-2 Vaccination 08/02/2021   PNEUMOCOCCAL CONJUGATE-20 08/02/2021   Pfizer(Comirnaty)Fall Seasonal Vaccine 12 years and older 12/13/2022   Pneumococcal Conjugate-13 06/21/2014   Pneumococcal Polysaccharide-23 01/18/2008   RSV,unspecified 03/16/2022   Td 11/21/2001, 09/11/2021   Tdap 12/22/2011   Zoster Recombinant(Shingrix) 02/03/2018, 04/07/2018   Zoster, Live 01/18/2008  4. Prostate cancer screening- past age based screening recommendations - no changes in urinary symptoms   5. Colon cancer screening - Dr. Verner released [facility] from [facility] 04/17/20 - no bright red blood per rectum or melena  6. Skin cancer screening- still seeing dermatology yearly- plans to mention  itching next visit. advised regular sunscreen use. Denies worrisome, changing, or new skin lesions.  7. Smoking associated screening (lung cancer screening, AAA screen 65-75, UA)-former smoker- quit years ago. No regular screening.  8. STD screening - not dating  Status of chronic or acute concerns   #Cerumen impaction- bilaterally notes some changes in hearing even with his aids - has required irrigation in past and wants evaluation today - we repeated this  #knee surgery in march for meniscal injury with DrRONITA [name] -worked hard in therapy and has recovered very well. Even by week 5 was off of cane!  -even played golf on Saturday 18 holes! Had led to deconditoning- trying to work his way back over last month  #prior diffuse itching on statin in past - had improved off crestor  but just arms/forearms itchy now- has tried to look into triggers and not aware   #mildly elevated TSH- Repeat TSH Lab Results  Component Value Date   TSH 5.61 (H) 09/15/2022   # history of stage III squamous cell carcinoma of the lungs S:  diagnosed October 2020-status post course of concurrent chemoradiation with 5 cycles of chemotherapy.  Did develop pancytopenia.  - with chemoradiation- Patient had partial response with initial treatment and underwent consolidation treatment with immunotherapy Imfinzi   x 5 cycles but developed pneumonitis and diffuse itching and ultimately stopped - also dealing with pneumonitis possibly related to the imfinzi -patient on Symbicort  and weaned off- unc was very helpful  -in 2025 monitoring new lesion that may be postinflammatory or postinfections  with plan for 6 month repeat CT  A/P: appears stable other than one small new spot that looked infectious vs inflammatory and not maligancy related with plans for repeat 6 months  #Persistent atrial fibrillation S: Rate controlled with atenolol  50 mg twice daily  Anticoagulated with Xarelto  20mg  A/P: appropriately anticoagulated  and rate controlled- continue current medicine  #hypertension S: medication:  atenolol  50 mg twice daily , amlodipine  5 mg- actually taking full tablet 3x a week- drops dose if too low BP Readings from Last 3 Encounters:  09/20/23 120/60  07/20/23 132/74  06/23/23 130/82  - monitors pressure weekly and well controlled  A/P:  well controlled continue current medications   #CAD based on ct cardiac scoring 298 in 2021 #hyperlipidemia S: Medication:atorvastatin  10 mg daily -itching on crestor  Lab Results  Component Value Date   CHOL 127 09/15/2022   HDL 45.50 09/15/2022   LDLCALC 66 09/15/2022   LDLDIRECT 62 09/09/2020   TRIG 81.0 09/15/2022   CHOLHDL 3 09/15/2022  A/P: good control last year- update today   # Depression-tried for referral to [facility] ([facility]) but they did not take his insurance sadly. Tolerating this. He's at an acceptance level- no worse. No SI. Wants to monitor. As no worsening and prior treatments unsuccesful we opted not to repeat PHQ9 as would not change management- recheck next visit  #Alcoholism-currently drinking 3-4 beverages per day but he is not interested in changing.    Recommended follow up: Return in about 6 months (around 03/21/2024) for followup or sooner if needed.Schedule b4 you leave. Future Appointments  Date Time Provider Department Center  10/19/2023 10:00 AM [facility] None  12/07/2023 10:00 AM [facility] None  01/19/2024 10:00 AM [facility] None  01/26/2024 10:00 AM Sherrod Sherrod, MD [facility] None   Lab/Order associations:NOT fasting- cheerios and orange juice   ICD-10-CM   1. Preventative health care  Z00.00     2. Permanent atrial fibrillation (HCC)  I48.21     3. Essential hypertension  I10     4. Elevated TSH  R79.89 TSH    5. Hyperlipidemia, unspecified hyperlipidemia type  E78.5 Comprehensive metabolic panel with GFR    CBC with  Differential/Platelet    Lipid panel    TSH    6. Screening for diabetes mellitus  Z13.1 Hemoglobin A1c    7. Overweight  E66.3 Hemoglobin A1c    8. Alcoholism (HCC) Chronic F10.20       No orders of the defined types were placed in this encounter.   Return precautions advised.  Garnette Lukes, MD

## 2023-10-19 ENCOUNTER — Inpatient Hospital Stay: Attending: Physician Assistant

## 2023-10-19 DIAGNOSIS — C3431 Malignant neoplasm of lower lobe, right bronchus or lung: Secondary | ICD-10-CM | POA: Diagnosis not present

## 2023-10-19 DIAGNOSIS — Z5112 Encounter for antineoplastic immunotherapy: Secondary | ICD-10-CM

## 2023-10-19 MED ORDER — HEPARIN SOD (PORK) LOCK FLUSH 100 UNIT/ML IV SOLN
500.0000 [IU] | Freq: Once | INTRAVENOUS | Status: AC
Start: 2023-10-19 — End: 2023-10-19
  Administered 2023-10-19: 500 [IU]

## 2023-10-19 MED ORDER — SODIUM CHLORIDE 0.9% FLUSH
10.0000 mL | Freq: Once | INTRAVENOUS | Status: AC
Start: 2023-10-19 — End: 2023-10-19
  Administered 2023-10-19: 10 mL

## 2023-12-07 ENCOUNTER — Inpatient Hospital Stay: Attending: Physician Assistant

## 2023-12-07 DIAGNOSIS — C3431 Malignant neoplasm of lower lobe, right bronchus or lung: Secondary | ICD-10-CM | POA: Insufficient documentation

## 2023-12-07 NOTE — Patient Instructions (Signed)

## 2023-12-14 DIAGNOSIS — Z961 Presence of intraocular lens: Secondary | ICD-10-CM | POA: Diagnosis not present

## 2023-12-14 DIAGNOSIS — H43813 Vitreous degeneration, bilateral: Secondary | ICD-10-CM | POA: Diagnosis not present

## 2023-12-14 DIAGNOSIS — H353132 Nonexudative age-related macular degeneration, bilateral, intermediate dry stage: Secondary | ICD-10-CM | POA: Diagnosis not present

## 2023-12-21 ENCOUNTER — Ambulatory Visit: Admitting: Family Medicine

## 2023-12-22 ENCOUNTER — Ambulatory Visit: Admitting: Family Medicine

## 2023-12-31 ENCOUNTER — Other Ambulatory Visit: Payer: Self-pay | Admitting: Medical Genetics

## 2024-01-07 ENCOUNTER — Ambulatory Visit: Payer: Self-pay | Admitting: Family Medicine

## 2024-01-07 ENCOUNTER — Encounter: Payer: Self-pay | Admitting: Family Medicine

## 2024-01-07 ENCOUNTER — Ambulatory Visit (INDEPENDENT_AMBULATORY_CARE_PROVIDER_SITE_OTHER): Admitting: Family Medicine

## 2024-01-07 VITALS — BP 112/68 | HR 90 | Temp 97.5°F | Ht 70.0 in | Wt 209.0 lb

## 2024-01-07 DIAGNOSIS — I739 Peripheral vascular disease, unspecified: Secondary | ICD-10-CM

## 2024-01-07 DIAGNOSIS — E785 Hyperlipidemia, unspecified: Secondary | ICD-10-CM | POA: Diagnosis not present

## 2024-01-07 DIAGNOSIS — R5383 Other fatigue: Secondary | ICD-10-CM

## 2024-01-07 DIAGNOSIS — I4821 Permanent atrial fibrillation: Secondary | ICD-10-CM

## 2024-01-07 DIAGNOSIS — I10 Essential (primary) hypertension: Secondary | ICD-10-CM | POA: Diagnosis not present

## 2024-01-07 DIAGNOSIS — R0602 Shortness of breath: Secondary | ICD-10-CM

## 2024-01-07 LAB — CBC WITH DIFFERENTIAL/PLATELET
Basophils Absolute: 0 K/uL (ref 0.0–0.1)
Basophils Relative: 0.7 % (ref 0.0–3.0)
Eosinophils Absolute: 0.2 K/uL (ref 0.0–0.7)
Eosinophils Relative: 3 % (ref 0.0–5.0)
HCT: 33.8 % — ABNORMAL LOW (ref 39.0–52.0)
Hemoglobin: 10.7 g/dL — ABNORMAL LOW (ref 13.0–17.0)
Lymphocytes Relative: 16.1 % (ref 12.0–46.0)
Lymphs Abs: 0.9 K/uL (ref 0.7–4.0)
MCHC: 31.7 g/dL (ref 30.0–36.0)
MCV: 87.3 fl (ref 78.0–100.0)
Monocytes Absolute: 0.7 K/uL (ref 0.1–1.0)
Monocytes Relative: 11.8 % (ref 3.0–12.0)
Neutro Abs: 4 K/uL (ref 1.4–7.7)
Neutrophils Relative %: 68.4 % (ref 43.0–77.0)
Platelets: 181 K/uL (ref 150.0–400.0)
RBC: 3.87 Mil/uL — ABNORMAL LOW (ref 4.22–5.81)
RDW: 16.9 % — ABNORMAL HIGH (ref 11.5–15.5)
WBC: 5.8 K/uL (ref 4.0–10.5)

## 2024-01-07 LAB — COMPREHENSIVE METABOLIC PANEL WITH GFR
ALT: 14 U/L (ref 0–53)
AST: 18 U/L (ref 0–37)
Albumin: 4 g/dL (ref 3.5–5.2)
Alkaline Phosphatase: 65 U/L (ref 39–117)
BUN: 11 mg/dL (ref 6–23)
CO2: 27 meq/L (ref 19–32)
Calcium: 8.8 mg/dL (ref 8.4–10.5)
Chloride: 102 meq/L (ref 96–112)
Creatinine, Ser: 0.87 mg/dL (ref 0.40–1.50)
GFR: 80.83 mL/min (ref >60.00)
Glucose, Bld: 112 mg/dL — ABNORMAL HIGH (ref 70–99)
Potassium: 4.3 meq/L (ref 3.5–5.1)
Sodium: 137 meq/L (ref 135–145)
Total Bilirubin: 0.6 mg/dL (ref 0.2–1.2)
Total Protein: 6.5 g/dL (ref 6.0–8.3)

## 2024-01-07 LAB — TSH: TSH: 3.67 u[IU]/mL (ref 0.35–5.50)

## 2024-01-07 NOTE — Progress Notes (Signed)
 Phone [phone] In person visit   Subjective:   Martin Bailey is a 83 y.o. year old very pleasant male patient who presents for/with See problem oriented charting Chief Complaint  Patient presents with   Fatigue    Worsening x2 months; legs feel tired and sore from walking short distances;    Past Medical History-  Patient Active Problem List   Diagnosis Date Noted   Elevated coronary artery calcium  score 05/02/2020    Priority: High   Stage III squamous cell carcinoma of right lung (HCC) 01/06/2019    Priority: High   Alcoholism (HCC) 06/21/2014    Priority: High   Atrial fibrillation (HCC) 03/04/2007    Priority: High   Elevated TSH 09/21/2014    Priority: Medium    Seborrheic dermatitis 07/19/2007    Priority: Medium    Hyperlipidemia 03/04/2007    Priority: Medium    Major depression (HCC) 03/04/2007    Priority: Medium    Essential hypertension 03/04/2007    Priority: Medium    Former smoker 06/21/2014    Priority: Low   Allergic rhinitis 04/06/2014    Priority: Low   Hx of adenomatous colonic polyps 03/05/2014    Priority: Low   Chronic anticoagulation 03/05/2014    Priority: Low   Testosterone  deficiency 09/02/2010    Priority: Low   Recurrent major depressive disorder, in full remission 09/15/2022   Abnormal findings on diagnostic imaging of lung 12/15/2019   Drug-induced pneumonitis 09/19/2019   Encounter for antineoplastic immunotherapy 03/29/2019   Goals of care, counseling/discussion 01/06/2019   Overweight 08/09/2017    Medications- reviewed and updated Current Outpatient Medications  Medication Sig Dispense Refill   amLODipine  (NORVASC ) 5 MG tablet TAKE 1 TABLET BY MOUTH DAILY 100 tablet 2   atenolol  (TENORMIN ) 50 MG tablet TAKE 1 TABLET BY MOUTH TWICE  DAILY 200 tablet 2   atorvastatin  (LIPITOR) 10 MG tablet TAKE 1 TABLET BY MOUTH DAILY 100 tablet 2   Multiple Vitamin (MULTIVITAMIN WITH MINERALS) TABS tablet Take 1 tablet by mouth  daily.     Multiple Vitamins-Minerals (PRESERVISION AREDS 2) CAPS Take 1 capsule by mouth 2 (two) times daily.     XARELTO  20 MG TABS tablet TAKE 1 TABLET BY MOUTH DAILY  WITH SUPPER 100 tablet 2   No current facility-administered medications for this visit.     Objective:  BP 112/68 (BP Location: Left Arm, Patient Position: Sitting, Cuff Size: Normal)   Pulse 90   Temp (!) 97.5 F (36.4 C) (Temporal)   Ht 5' 10 (1.778 m)   Wt 209 lb (94.8 kg)   SpO2 99%   BMI 29.99 kg/m  Gen: NAD, resting comfortably CV: RRR no murmurs rubs or gallops Lungs: CTAB no crackles, wheeze, rhonchi Ext: no edema, 2+ PT pulses     Assessment and Plan   #social update- waiting on fridge between 8-12 today so nervous and wanted to hold off on back x-rays at this time as he needs to leave  # Fatigue/leg fatigue/shortness of breath S: Reports gradually worsening at least over the last 2 months.  Knee not bothering him. His legs feel tired from walking even short distances for him- improves with rest . Went to concern Saturday night and tried 2-3 blocks and had to rest in between. Even 7 steps makes legs very fatigued and feels shortness of breath now though. Can even feel winded at rest at times. No chest pain. Sometimes has back pain. Better with shopping cart.  No heart racing  Feels winded going to gym up stairs but can do 10 minutes on bike without issue and does then circuit training with 2 8 circuit cycles and doing treadmill 2.5 mph without same level of fatigue.   -had some old Symbicort  and since this felt similar just in different area to issues he had with lungs a few years ago he tried this without any relief.  A/P: Patient with 3 issues but all somewhat similar-General Fatigue, leg fatigue, shortness of breath -Check basic fatigue labs including CBC, CMP, TSH.  He asks about testosterone  but he is not fasting today so we opted out of this.  He has had mildly elevated TSH in the past so  hypothyroidism certainly on differential - Has upcoming chest CT with pulmonology to follow-up on prior lung cancer and did have prior lung nodule that was new-asked him to see if he can move that up.  Hold off on x-ray as result - Pneumonitis in the past and did well with Symbicort  but he has tried that without benefit - Discussed potential cardiac cause-he declines EKG today.  He understands my concern about possible cardiac element -We need to rule out vascular claudication though he has good pulses today-check ABIs -We also considered neurogenic claudication-occasionally has some back pain when he feels this fatigue in his legs-considering back x-ray depending on other workup -Considered echocardiogram with shortness of breath-reassuring in February but the symptoms are new - on xarelto  so unlikely to have pulmonary embolism  - on statin long term-we discussed possibility of this being related.  With the pain I did not check a CK - Basic neck steps: If above workup reassuring consider doing echocardiogram or back x-ray or both  # Emphysema-incidental finding on prior imaging-largely asymptomatic even with lung cancer history.  Most recent CT 07/13/2023-did have new nodule 2 x 1.1 cm posterior right lung and a plan to follow this up with repeat imaging with visit with oncology 7 to 10 days afterwards  #Persistent atrial fibrillation S: Rate controlled with atenolol  50 mg twice daily  Anticoagulated with Xarelto  20mg  A/P: appropriately anticoagulated and rate controlled- continue current medicine   #hypertension S: medication:  atenolol  50 mg twice daily , amlodipine  5 mg- actually taking full tablet 3x a week- drops dose if too low  A/P: Well-controlled-continue current medication  #CAD based on ct cardiac scoring 298 in 2021 #hyperlipidemia S: Medication:atorvastatin  10 mg daily -itching on crestor   A/P: As above discussed possible EKG or cardiac workup with new shortness of breath-he  wants to hold off for now   Recommended follow up: Return for next already scheduled visit or sooner if needed. Future Appointments  Date Time Provider Department Center  01/19/2024 10:00 AM [facility] None  01/26/2024 10:00 AM Sherrod Sherrod, MD [facility] None  02/08/2024  9:30 AM [facility] None  03/21/2024  9:20 AM Katrinka Garnette KIDD, MD [facility]    Lab/Order associations:   ICD-10-CM   1. Intermittent claudication  I73.9 VAS US  ABI WITH/WO TBI    2. SOB (shortness of breath)  R06.02 Comprehensive metabolic panel with GFR    CBC with Differential/Platelet    TSH    3. Fatigue, unspecified type  R53.83 Comprehensive metabolic panel with GFR    CBC with Differential/Platelet    TSH    4. Permanent atrial fibrillation (HCC)  I48.21     5. Essential hypertension  I10     6. Hyperlipidemia,  unspecified hyperlipidemia type  E78.5       No orders of the defined types were placed in this encounter.   Return precautions advised.  Garnette Lukes, MD

## 2024-01-07 NOTE — Patient Instructions (Addendum)
 We have placed a referral for you today for blood flow tests in legs through cardiology- please call their # if you do not hear within a week (may be listed below or you may see mychart message within a few days with #).   See if they can move CT up   Please stop by lab before you go If you have mychart- we will send your results within 3 business days of us  receiving them.  If you do not have mychart- we will call you about results within 5 business days of us  receiving them.  *please also note that you will see labs on mychart as soon as they post. I will later go in and write notes on them- will say notes from Dr. Katrinka   If above workup reassuring consider doing echocardiogram or back x-ray or both  Recommended follow up: Return for next already scheduled visit or sooner if needed.

## 2024-01-09 ENCOUNTER — Encounter: Payer: Self-pay | Admitting: Family Medicine

## 2024-01-11 ENCOUNTER — Other Ambulatory Visit: Payer: Self-pay | Admitting: Family Medicine

## 2024-01-11 ENCOUNTER — Encounter: Payer: Self-pay | Admitting: Family Medicine

## 2024-01-11 DIAGNOSIS — D509 Iron deficiency anemia, unspecified: Secondary | ICD-10-CM

## 2024-01-14 ENCOUNTER — Ambulatory Visit (HOSPITAL_COMMUNITY)
Admission: RE | Admit: 2024-01-14 | Discharge: 2024-01-14 | Disposition: A | Discharge status: Home / Self Care | Source: Ambulatory Visit | Attending: Family Medicine | Admitting: Family Medicine

## 2024-01-14 DIAGNOSIS — I739 Peripheral vascular disease, unspecified: Secondary | ICD-10-CM | POA: Insufficient documentation

## 2024-01-14 LAB — VAS US ABI WITH/WO TBI
Left ABI: 1.14
Right ABI: 1.07

## 2024-01-19 ENCOUNTER — Other Ambulatory Visit

## 2024-01-19 ENCOUNTER — Ambulatory Visit (HOSPITAL_COMMUNITY)
Admission: RE | Admit: 2024-01-19 | Discharge: 2024-01-19 | Disposition: A | Discharge status: Home / Self Care | Source: Ambulatory Visit | Attending: Physician Assistant | Admitting: Physician Assistant

## 2024-01-19 ENCOUNTER — Inpatient Hospital Stay: Attending: Physician Assistant

## 2024-01-19 DIAGNOSIS — C3491 Malignant neoplasm of unspecified part of right bronchus or lung: Secondary | ICD-10-CM | POA: Insufficient documentation

## 2024-01-19 DIAGNOSIS — K921 Melena: Secondary | ICD-10-CM | POA: Insufficient documentation

## 2024-01-19 DIAGNOSIS — Z923 Personal history of irradiation: Secondary | ICD-10-CM | POA: Insufficient documentation

## 2024-01-19 DIAGNOSIS — J704 Drug-induced interstitial lung disorders, unspecified: Secondary | ICD-10-CM | POA: Diagnosis not present

## 2024-01-19 DIAGNOSIS — C3431 Malignant neoplasm of lower lobe, right bronchus or lung: Secondary | ICD-10-CM | POA: Diagnosis present

## 2024-01-19 DIAGNOSIS — Z9221 Personal history of antineoplastic chemotherapy: Secondary | ICD-10-CM | POA: Diagnosis not present

## 2024-01-19 DIAGNOSIS — D649 Anemia, unspecified: Secondary | ICD-10-CM | POA: Diagnosis not present

## 2024-01-19 DIAGNOSIS — I700 Atherosclerosis of aorta: Secondary | ICD-10-CM | POA: Diagnosis not present

## 2024-01-19 DIAGNOSIS — D61818 Other pancytopenia: Secondary | ICD-10-CM | POA: Diagnosis not present

## 2024-01-19 DIAGNOSIS — C3490 Malignant neoplasm of unspecified part of unspecified bronchus or lung: Secondary | ICD-10-CM | POA: Diagnosis not present

## 2024-01-19 DIAGNOSIS — J90 Pleural effusion, not elsewhere classified: Secondary | ICD-10-CM | POA: Diagnosis not present

## 2024-01-19 LAB — CBC (CANCER CENTER ONLY)
HCT: 31.3 % — ABNORMAL LOW (ref 39.0–52.0)
Hemoglobin: 10 g/dL — ABNORMAL LOW (ref 13.0–17.0)
MCH: 27 pg (ref 26.0–34.0)
MCHC: 31.9 g/dL (ref 30.0–36.0)
MCV: 84.6 fL (ref 80.0–100.0)
Platelet Count: 213 K/uL (ref 150–400)
RBC: 3.7 MIL/uL — ABNORMAL LOW (ref 4.22–5.81)
RDW: 15.6 % — ABNORMAL HIGH (ref 11.5–15.5)
WBC Count: 6.3 K/uL (ref 4.0–10.5)
nRBC: 0 % (ref 0.0–0.2)

## 2024-01-19 LAB — CMP (CANCER CENTER ONLY)
ALT: 13 U/L (ref 0–44)
AST: 17 U/L (ref 15–41)
Albumin: 4 g/dL (ref 3.5–5.0)
Alkaline Phosphatase: 64 U/L (ref 38–126)
Anion gap: 4 — ABNORMAL LOW (ref 5–15)
BUN: 15 mg/dL (ref 8–23)
CO2: 26 mmol/L (ref 22–32)
Calcium: 9.1 mg/dL (ref 8.9–10.3)
Chloride: 103 mmol/L (ref 98–111)
Creatinine: 1.11 mg/dL (ref 0.61–1.24)
GFR, Estimated: >60 mL/min (ref >60)
Glucose, Bld: 110 mg/dL — ABNORMAL HIGH (ref 70–99)
Potassium: 4.7 mmol/L (ref 3.5–5.1)
Sodium: 133 mmol/L — ABNORMAL LOW (ref 135–145)
Total Bilirubin: 0.6 mg/dL (ref 0.0–1.2)
Total Protein: 6.7 g/dL (ref 6.5–8.1)

## 2024-01-19 MED ORDER — IOHEXOL 300 MG/ML  SOLN
75.0000 mL | Freq: Once | INTRAMUSCULAR | Status: AC | PRN
  Administered 2024-01-19: 75 mL via INTRAVENOUS

## 2024-01-19 MED ORDER — HEPARIN SOD (PORK) LOCK FLUSH 100 UNIT/ML IV SOLN
500.0000 [IU] | Freq: Once | INTRAVENOUS | Status: AC
  Administered 2024-01-19: 500 [IU] via INTRAVENOUS

## 2024-01-19 MED ORDER — HEPARIN SOD (PORK) LOCK FLUSH 100 UNIT/ML IV SOLN
INTRAVENOUS | Status: AC
Start: 2024-01-19 — End: 2024-01-19
  Filled 2024-01-19: qty 5

## 2024-01-19 MED ORDER — SODIUM CHLORIDE (PF) 0.9 % IJ SOLN
INTRAMUSCULAR | Status: AC
  Filled 2024-01-19: qty 50

## 2024-01-21 ENCOUNTER — Other Ambulatory Visit (INDEPENDENT_AMBULATORY_CARE_PROVIDER_SITE_OTHER)

## 2024-01-21 ENCOUNTER — Ambulatory Visit: Payer: Self-pay | Admitting: Family Medicine

## 2024-01-21 DIAGNOSIS — D509 Iron deficiency anemia, unspecified: Secondary | ICD-10-CM | POA: Diagnosis not present

## 2024-01-21 LAB — CBC WITH DIFFERENTIAL/PLATELET
Basophils Absolute: 0 K/uL (ref 0.0–0.1)
Basophils Relative: 0.5 % (ref 0.0–3.0)
Eosinophils Absolute: 0.1 K/uL (ref 0.0–0.7)
Eosinophils Relative: 1.6 % (ref 0.0–5.0)
HCT: 34.3 % — ABNORMAL LOW (ref 39.0–52.0)
Hemoglobin: 10.9 g/dL — ABNORMAL LOW (ref 13.0–17.0)
Lymphocytes Relative: 21 % (ref 12.0–46.0)
Lymphs Abs: 1.2 K/uL (ref 0.7–4.0)
MCHC: 31.6 g/dL (ref 30.0–36.0)
MCV: 85 fl (ref 78.0–100.0)
Monocytes Absolute: 0.8 K/uL (ref 0.1–1.0)
Monocytes Relative: 14.2 % — ABNORMAL HIGH (ref 3.0–12.0)
Neutro Abs: 3.6 K/uL (ref 1.4–7.7)
Neutrophils Relative %: 62.7 % (ref 43.0–77.0)
Platelets: 205 K/uL (ref 150.0–400.0)
RBC: 4.04 Mil/uL — ABNORMAL LOW (ref 4.22–5.81)
RDW: 16.8 % — ABNORMAL HIGH (ref 11.5–15.5)
WBC: 5.8 K/uL (ref 4.0–10.5)

## 2024-01-24 ENCOUNTER — Other Ambulatory Visit

## 2024-01-25 ENCOUNTER — Ambulatory Visit: Payer: Self-pay | Admitting: Family Medicine

## 2024-01-25 ENCOUNTER — Other Ambulatory Visit (INDEPENDENT_AMBULATORY_CARE_PROVIDER_SITE_OTHER)

## 2024-01-25 DIAGNOSIS — I10 Essential (primary) hypertension: Secondary | ICD-10-CM | POA: Diagnosis not present

## 2024-01-25 LAB — FECAL OCCULT BLOOD, IMMUNOCHEMICAL: Fecal Occult Bld: POSITIVE — AB

## 2024-01-26 ENCOUNTER — Other Ambulatory Visit: Payer: Self-pay | Admitting: Family Medicine

## 2024-01-26 ENCOUNTER — Inpatient Hospital Stay: Admitting: Internal Medicine

## 2024-01-26 VITALS — BP 127/64 | HR 54 | Temp 97.6°F | Resp 17 | Ht 70.0 in | Wt 205.0 lb

## 2024-01-26 DIAGNOSIS — C3490 Malignant neoplasm of unspecified part of unspecified bronchus or lung: Secondary | ICD-10-CM | POA: Diagnosis not present

## 2024-01-26 DIAGNOSIS — D649 Anemia, unspecified: Secondary | ICD-10-CM

## 2024-01-26 DIAGNOSIS — C3431 Malignant neoplasm of lower lobe, right bronchus or lung: Secondary | ICD-10-CM | POA: Diagnosis not present

## 2024-01-26 DIAGNOSIS — K921 Melena: Secondary | ICD-10-CM

## 2024-01-26 NOTE — Progress Notes (Signed)
 [facility] [phone]   [phone]  OFFICE PROGRESS NOTE  Katrinka Garnette KIDD, MD [address] [place] [address]  DIAGNOSIS: Stage IIIb (T3, N2, M0) non-small cell lung cancer, squamous cell carcinoma presented with right lower lobe lung mass in addition to subcarinal lymphadenopathy diagnosed in October 2020.    PRIOR THERAPY:  1) Weekly concurrent chemoradiation with Carboplatin  for an AUC of 2 Paclitaxel  45 mg/m2. First dose 01/16/2019. Status post 5 cycles.  Last dose was given February 13, 2019. 2) Consolidation treatment with immunotherapy with Imfinzi  1500 mg IV every 4 weeks.  First dose April 05, 2019.  Status post 5 cycles.  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  CURRENT THERAPY:  Observation.  INTERVAL HISTORY: Martin Bailey 83 y.o. male returns to the clinic today for 70-month follow-up visit.  Discussed the use of AI scribe software for clinical note transcription with the patient, who gave verbal consent to proceed.  History of Present Illness Foster Bailey is an 83 year old male with stage 3 small cell lung cancer who presents for evaluation with a repeat CT scan of the chest for restaging of his disease.  He was diagnosed with stage 3 small cell lung cancer in October 2020 and underwent concurrent chemoradiation with weekly carboplatin  and paclitaxel , followed by five cycles of consolidation treatment with immunotherapy. His therapy with durvalumab  was discontinued due to immunotherapy-mediated pneumonitis, and he has been under observation since then.  He experiences significant fatigue and shortness of breath with minimal exertion. Anemia has been identified, with a hemoglobin level around 10. He is awaiting a referral to a gastroenterologist for further evaluation of blood in his stool.  He has a history of lung nodules, with some nodules in the right lung  waxing and waning over time. A new nodule has been identified in the left lower lobe, measuring approximately 7 mm.  He recalls having similar fatigue several years ago, which was attributed to lung inflammation and resolved quickly with the use of an inhaler. He currently does not take iron supplements but takes a regular multivitamin. He maintains an active lifestyle, working out three times a week, but finds himself needing to rest after walking short distances, such as from the parking lot to the clinic.    MEDICAL HISTORY: Past Medical History:  Diagnosis Date   Arthritis    Cataract    left   COLONIC POLYPS, HX OF 04/05/2007   ONE ADENOMATOUS POLYP AND ONE HYPERPLASTIC POLYP   DEPRESSION 03/04/2007   DERMATITIS, SEBORRHEIC 07/19/2007   Dysrhythmia    FIBRILLATION, ATRIAL 03/04/2007   Hearing aid worn    B/L   History of kidney stones    HYPERLIPIDEMIA 03/04/2007   HYPERTENSION 03/04/2007   Lung cancer (HCC)    non small cell lung ca   Lung mass    mediastinal adenopathy    ALLERGIES:  is allergic to hydrocodone .  MEDICATIONS:  Current Outpatient Medications  Medication Sig Dispense Refill   amLODipine  (NORVASC ) 5 MG tablet TAKE 1 TABLET BY MOUTH DAILY 100 tablet 2   atenolol  (TENORMIN ) 50 MG tablet TAKE 1 TABLET BY MOUTH TWICE  DAILY 200 tablet 2   atorvastatin  (LIPITOR) 10 MG tablet TAKE 1 TABLET BY MOUTH DAILY 100 tablet 2   Multiple Vitamin (MULTIVITAMIN WITH MINERALS) TABS tablet Take 1 tablet by mouth daily.     Multiple Vitamins-Minerals (PRESERVISION AREDS 2) CAPS Take  1 capsule by mouth 2 (two) times daily.     XARELTO  20 MG TABS tablet TAKE 1 TABLET BY MOUTH DAILY  WITH SUPPER 100 tablet 2   No current facility-administered medications for this visit.    SURGICAL HISTORY:  Past Surgical History:  Procedure Laterality Date   BIOPSY  01/09/2020   Procedure: BIOPSY;  Surgeon: Neda Jennet LABOR, MD;  Location: [facility];  Service: Endoscopy;;    BRONCHIAL WASHINGS  01/09/2020   Procedure: BRONCHIAL WASHINGS;  Surgeon: Neda Jennet LABOR, MD;  Location: [facility];  Service: Endoscopy;;   CATARACT EXTRACTION W/ INTRAOCULAR LENS IMPLANT     right   COLONOSCOPY     IR IMAGING GUIDED PORT INSERTION  01/31/2019   JOINT REPLACEMENT  06/23/2023   PARTIAL KNEE ARTHROPLASTY Right 06/23/2023   Procedure: UNICOMPARTMENTAL KNEE-MEDIAL;  Surgeon: [name] Toribio LABOR, MD;  Location: [facility];  Service: Orthopedics;  Laterality: Right;   RADIOFREQUENCY ABLATION     x2 for fib, on 2nd attempt switched form a flutter ot a fib.    ROTATOR CUFF REPAIR Right    TONSILLECTOMY     VIDEO BRONCHOSCOPY N/A 01/09/2020   Procedure: VIDEO BRONCHOSCOPY WITH FLUORO;  Surgeon: Neda Jennet LABOR, MD;  Location: [facility];  Service: Endoscopy;  Laterality: N/A;   VIDEO BRONCHOSCOPY WITH ENDOBRONCHIAL ULTRASOUND Left 12/30/2018   Procedure: VIDEO BRONCHOSCOPY WITH ENDOBRONCHIAL ULTRASOUND WITH FLUOROSCOPY;  Surgeon: Neda Jennet LABOR, MD;  Location: [facility];  Service: Thoracic;  Laterality: Left;   WISDOM TOOTH EXTRACTION      REVIEW OF SYSTEMS:  Constitutional: positive for fatigue Eyes: negative Ears, nose, mouth, throat, and face: negative Respiratory: positive for dyspnea on exertion Cardiovascular: negative Gastrointestinal: negative Genitourinary:negative Integument/breast: negative Hematologic/lymphatic: negative Musculoskeletal:negative Neurological: negative Behavioral/Psych: negative Endocrine: negative Allergic/Immunologic: negative   PHYSICAL EXAMINATION: General appearance: alert, cooperative, fatigued, and no distress Head: Normocephalic, without obvious abnormality, atraumatic Neck: no adenopathy, no JVD, supple, symmetrical, trachea midline, and thyroid  not enlarged, symmetric, no tenderness/mass/nodules Lymph nodes: Cervical, supraclavicular, and axillary nodes normal. Resp: clear to auscultation bilaterally Back: symmetric, no  curvature. ROM normal. No CVA tenderness. Cardio: regular rate and rhythm, S1, S2 normal, no murmur, click, rub or gallop GI: soft, non-tender; bowel sounds normal; no masses,  no organomegaly Extremities: extremities normal, atraumatic, no cyanosis or edema Neurologic: Alert and oriented X 3, normal strength and tone. Normal symmetric reflexes. Normal coordination and gait  ECOG PERFORMANCE STATUS: 1 - Symptomatic but completely ambulatory  Blood pressure 127/64, pulse (!) 54, temperature 97.6 F (36.4 C), temperature source Temporal, resp. rate 17, height 5' 10 (1.778 m), weight 205 lb (93 kg), SpO2 97%.  LABORATORY DATA: Lab Results  Component Value Date   WBC 5.8 01/21/2024   HGB 10.9 (L) 01/21/2024   HCT 34.3 (L) 01/21/2024   MCV 85.0 01/21/2024   PLT 205.0 01/21/2024      Chemistry      Component Value Date/Time   NA 133 (L) 01/19/2024 1156   K 4.7 01/19/2024 1156   CL 103 01/19/2024 1156   CO2 26 01/19/2024 1156   BUN 15 01/19/2024 1156   CREATININE 1.11 01/19/2024 1156      Component Value Date/Time   CALCIUM  9.1 01/19/2024 1156   ALKPHOS 64 01/19/2024 1156   AST 17 01/19/2024 1156   ALT 13 01/19/2024 1156   BILITOT 0.6 01/19/2024 1156       RADIOGRAPHIC STUDIES: CT Chest W Contrast Result Date: 01/21/2024 CLINICAL DATA:  Follow-up of  non-small-cell lung cancer. Stage III right-sided primary. * Tracking Code: BO * EXAM: CT CHEST WITH CONTRAST TECHNIQUE: Multidetector CT imaging of the chest was performed during intravenous contrast administration. RADIATION DOSE REDUCTION: This exam was performed according to the departmental dose-optimization program which includes automated exposure control, adjustment of the mA and/or kV according to patient size and/or use of iterative reconstruction technique. CONTRAST:  75mL OMNIPAQUE  IOHEXOL  300 MG/ML  SOLN COMPARISON:  07/13/2022 FINDINGS: Cardiovascular: Right Port-A-Cath tip high right atrium. Aortic atherosclerosis.  Mild cardiomegaly with left atrial enlargement. Multivessel coronary artery atherosclerosis. No central pulmonary embolism, on this non-dedicated study. Mediastinum/Nodes: No supraclavicular adenopathy. Upper normal prevascular node of 7 mm is unchanged. No middle mediastinal or hilar adenopathy. Lungs/Pleura: Similar small right pleural effusion. Moderate centrilobular emphysema. Right infrahilar presumably radiation induced consolidation is similar, without local recurrence. New medial right lower lobe subsolid, likely mixed attenuation nodule of 14 x 10 mm on image 82/5. The posterior right upper lobe ground-glass nodule on the prior exam has resolved. There is a more inferior and anterior right lower lobe subsolid nodule of 1.8 x 1.0 cm on image 79/5., New solid left lower lobe pulmonary nodule of 7 mm on image 125/5. Upper Abdomen: Normal imaged portions of the liver, spleen, stomach, pancreas, adrenal glands, left kidney. An interpolar right renal too small to characterize exophytic lesion is likely a cyst and does not warrant specific imaging follow-up. Musculoskeletal: Old left rib fractures. Moderate T7 compression deformity is unchanged. Minimal T11 compression deformity is also similar. No ventral canal encroachment. IMPRESSION: 1. Radiation changes in the perihilar right lung, without local recurrence. 2. New solid 7 mm left lower lobe pulmonary nodule is indeterminate. Cannot exclude metastatic disease or less likely and new primary. 3. Shifting right-sided subsolid nodules. The previously described right upper lobe nodule has resolved. There are new more inferior right upper and superior segment right lower lobe subsolid nodules as detailed above. Given waxing and waning appearance, favor an infectious or inflammatory etiology. Consider chest CT follow-up at 3 months to evaluate both the new solid left-sided and shifting subsolid right-sided nodules. 4. Similar small right pleural effusion. 5.  Incidental findings, including: Aortic atherosclerosis (ICD10-I70.0), coronary artery atherosclerosis and emphysema (ICD10-J43.9). Electronically Signed   By: Rockey Kilts M.Martin.   On: 01/21/2024 13:34   VAS US  ABI WITH/WO TBI Result Date: 01/14/2024  LOWER EXTREMITY DOPPLER STUDY Patient Name:  Martin Bailey  Date of Exam:   01/14/2024 Medical Rec #: [id number]             Accession #:    [id number] Date of Birth: 1940/04/26              Patient Gender: M Patient Age:   19 years Exam Location:  [address] Procedure:      VAS US  ABI WITH/WO TBI Referring Phys: GARNETTE LUKES --------------------------------------------------------------------------------  Indications: Claudication. High Risk Factors: Hypertension, hyperlipidemia, past history of smoking.  Comparison Study: N/A Performing Technologist: Dena Pane  Examination Guidelines: A complete evaluation includes at minimum, Doppler waveform signals and systolic blood pressure reading at the level of bilateral brachial, anterior tibial, and posterior tibial arteries, when vessel segments are accessible. Bilateral testing is considered an integral part of a complete examination. Photoelectric Plethysmograph (PPG) waveforms and toe systolic pressure readings are included as required and additional duplex testing as needed. Limited examinations for reoccurring indications may be performed as noted.  ABI Findings: +---------+------------------+-----+-----------+---------------------------+ Right    Rt Pressure (mmHg)IndexWaveform  Comment                     +---------+------------------+-----+-----------+---------------------------+ Brachial 138                    triphasic                              +---------+------------------+-----+-----------+---------------------------+ PTA      148               1.07 multiphasic                            +---------+------------------+-----+-----------+---------------------------+ DP        127               0.92 monophasic Questioning collateral flow +---------+------------------+-----+-----------+---------------------------+ Great Toe90                0.65                                        +---------+------------------+-----+-----------+---------------------------+ +---------+------------------+-----+----------+---------------------------+ Left     Lt Pressure (mmHg)IndexWaveform  Comment                     +---------+------------------+-----+----------+---------------------------+ Brachial 129                    triphasic                             +---------+------------------+-----+----------+---------------------------+ PTA      158               1.14 triphasic                             +---------+------------------+-----+----------+---------------------------+ DP       124               0.90 monophasicQuestioning collateral flow +---------+------------------+-----+----------+---------------------------+ Great Toe83                0.60                                       +---------+------------------+-----+----------+---------------------------+ +-------+-----------+-----------+------------+------------+ ABI/TBIToday's ABIToday's TBIPrevious ABIPrevious TBI +-------+-----------+-----------+------------+------------+ Right  1.07       0.65                                +-------+-----------+-----------+------------+------------+ Left   1.14       0.60                                +-------+-----------+-----------+------------+------------+  Summary: Right: Resting right ankle-brachial index is within normal range. The right toe-brachial index is abnormal.  Left: Resting left ankle-brachial index is within normal range. The left toe-brachial index is abnormal.  *See table(s) above for measurements and observations.  Electronically signed by Lonni Gaskins MD on 01/14/2024 at 4:50:08 PM.    Final     ASSESSMENT AND PLAN:  This is a very pleasant 83 years old white male with a  stage IIIb non-small cell lung cancer, squamous cell carcinoma diagnosed in October 2020 status post course of concurrent chemoradiation with 5 cycles of chemotherapy with carboplatin  and paclitaxel . The patient tolerated his treatment well except for pancytopenia. The patient had partial response after his treatment. He underwent consolidation treatment with immunotherapy with Imfinzi  1500 mg IV every 4 weeks status post 5  cycles.   His treatment was discontinued because of the persistent and recurrent immunotherapy mediated pneumonitis.  This was treated with a tapered dose of prednisone  and improved.   The patient has been on observation for the last few years.  He has been complaining of increasing fatigue and shortness of breath recently. He had repeat CT scan of the chest performed recently.  I personally independently reviewed the scan images and discussed the result and showed the images to the patient today.  He continues to have pulmonary nodules that wax and wane with new 0.7 cm left lower lobe nodule. Assessment and Plan Assessment & Plan Non-small cell lung cancer Stage 3 small cell lung cancer treated with chemoradiation and durvalumab , discontinued due to immunotherapy-mediated pneumonitis. Current CT scan reveals a new 7 mm nodule in the left lower lobe and waxing and waning nodules in the right lung, with no significant changes in the original tumor size. Anemia is likely causing fatigue rather than lung issues. Due to changes in lung nodules, a repeat CT scan is planned in 3 months to monitor progression. - Schedule repeat CT scan in 3 months to monitor lung nodules  Anemia Anemia likely contributing to fatigue and dyspnea. Hemoglobin level is approximately 10. Presence of blood in stool suggests possible gastrointestinal bleeding as a cause. Referral to gastroenterology for further evaluation is pending. Iron supplementation is  considered but deferred until after gastroenterology evaluation to avoid interference with potential procedures. - Await gastroenterology appointment for evaluation of gastrointestinal bleeding - Consider iron supplementation after gastroenterology evaluation  Immunotherapy-mediated pneumonitis Pneumonitis secondary to durvalumab , which was discontinued. No current symptoms suggestive of active pneumonitis.  Follow-up Plan to monitor lung nodules and address anemia. - Schedule follow-up appointment in 3 months after repeat CT scan The patient was advised to call immediately if he has any concerning symptoms in the interval.  The patient voices understanding of current disease status and treatment options and is in agreement with the current care plan.  All questions were answered. The patient knows to call the clinic with any problems, questions or concerns. We can certainly see the patient much sooner if necessary. The total time spent in the appointment was 30 minutes including review of chart and various tests results, discussions about plan of care and coordination of care plan .  Disclaimer: This note was dictated with voice recognition software. Similar sounding words can inadvertently be transcribed and may not be corrected upon review.

## 2024-01-28 ENCOUNTER — Telehealth: Payer: Self-pay

## 2024-01-28 ENCOUNTER — Encounter: Payer: Self-pay | Admitting: Gastroenterology

## 2024-01-28 ENCOUNTER — Ambulatory Visit: Admitting: Gastroenterology

## 2024-01-28 VITALS — BP 108/62 | HR 82 | Ht 70.0 in | Wt 205.2 lb

## 2024-01-28 DIAGNOSIS — R0609 Other forms of dyspnea: Secondary | ICD-10-CM

## 2024-01-28 DIAGNOSIS — Z860100 Personal history of colon polyps, unspecified: Secondary | ICD-10-CM | POA: Diagnosis not present

## 2024-01-28 DIAGNOSIS — D649 Anemia, unspecified: Secondary | ICD-10-CM

## 2024-01-28 DIAGNOSIS — R195 Other fecal abnormalities: Secondary | ICD-10-CM

## 2024-01-28 DIAGNOSIS — R1319 Other dysphagia: Secondary | ICD-10-CM

## 2024-01-28 DIAGNOSIS — Z7901 Long term (current) use of anticoagulants: Secondary | ICD-10-CM

## 2024-01-28 MED ORDER — NA SULFATE-K SULFATE-MG SULF 17.5-3.13-1.6 GM/177ML PO SOLN: 1.0000 | Freq: Once | ORAL | 0 refills | Status: AC

## 2024-01-28 NOTE — Progress Notes (Signed)
 Martin Bailey [id number] [birth year]   Chief Complaint: Blood in stool  Referring Provider: Katrinka Garnette KIDD, MD Primary GI MD: Dr. Legrand  HPI: Martin Bailey is a 83 y.o. male with past medical history of arthritis, colon polyps, depression, A-fib on Xarelto , HLD, HTN, lung cancer s/p chemo and radiation, kidney stones who is referred by PCP for blood in stool and anemia.    Underwent colonoscopy with Dr. Legrand 07/27/2017 with multiple precancerous polyps found.  1 polyp was biopsied on the ICV and patient was referred to [facility] due to challenging location for complete resection. Repeat colonoscopy recommended June 2020.  Follows with oncology for stage IIIb non-small cell lung cancer, squamous cell carcinoma, diagnosed 12/2018 s/p chemoradiation and currently in observation.  At last visit 01/26/2024 endorsed significant fatigue and shortness of breath with minimal exertion.  Has been noted to have anemia with hemoglobin level around 10.  Stool FOBT positive on 01/25/2024.  Patient is on Xarelto .    Discussed the use of AI scribe software for clinical note transcription with the patient, who gave verbal consent to proceed.  History of Present Illness Martin Bailey is an 83 year old male with anemia and a positive stool test who presents for evaluation of potential gastrointestinal bleeding. He was referred by his primary doctor to rule out any GI source of blood loss.  He has anemia with hemoglobin levels around 10 to 11 g/dL and a positive stool test for blood. He is currently on Xarelto . No visible blood in stool has been observed. He experiences periodic vomiting without an apparent cause, occurring a few times a year, sometimes triggered by his first cup of coffee in the morning. Occasional heartburn is reported, but no significant acid reflux. He has difficulty swallowing pills, feeling as though they get stuck in his throat, but does not have trouble  swallowing food. Regular bowel movements occur at least once a day, though sometimes more, with occasional loose stools and slight constipation.  He reports significant fatigue, stating that 'just walking to the next room exhausts me,' and describes his legs feeling tired and weak, accompanied by heavy breathing. These symptoms resolve within a few seconds. He maintains an active lifestyle, working out at [employer] three days a week and playing golf, although he uses a cart due to knee issues. Shortness of breath began after his knee replacement surgery on June 23, 2023, although he was unable to do much physical activity before the surgery due to knee pain. No recent worsening of shortness of breath since it was first noticed.   Previous GI Procedures/Imaging   Colonoscopy 04/17/2020 at [facility] - The examined portion of the ileum was normal. - One 8 mm polyp at the ileocecal valve. - One 4 mm polyp in the distal transverse colon, removed with a cold snare. Resected and retrieved. - The examination was otherwise normal on direct and retroflexion views.  Colonoscopy 07/27/2017 - One 3 mm polyp in the cecum, removed with a cold biopsy forceps. Resected and retrieved. - One 10 mm polyp at the ileocecal valve. Biopsied. - One 4 mm polyp in the proximal ascending colon, removed with a cold snare. Resected and retrieved. - One 2 mm polyp in the distal ascending colon, removed with a cold biopsy forceps. Resected and retrieved. - One 8 mm polyp at the hepatic flexure, removed with a cold snare. Resected and retrieved. - One 20 mm polyp in the distal transverse colon. Injected. - Internal  hemorrhoids. - The examination was otherwise normal on direct and retroflexion views. Path: 1. Surgical [P], cecum, polyp - TUBULAR ADENOMA WITHOUT HIGH GRADE DYSPLASIA OR MALIGNANCY. 2. Surgical [P], ileocecal valve biopsies - TUBULAR ADENOMA(S) WITHOUT HIGH GRADE DYSPLASIA OR MALIGNANCY. 3. Surgical [P], ascending, polyp  (2) - TUBULAR ADENOMA(S) WITHOUT HIGH GRADE DYSPLASIA OR MALIGNANCY. 4. Surgical [P], hepatic flexure, polyp - SESSILE SERRATED POLYP(S) WITHOUT CYTOLOGIC DYSPLASIA.  Past Medical History:  Diagnosis Date   Arthritis    Cataract    left   COLONIC POLYPS, HX OF 04/05/2007   ONE ADENOMATOUS POLYP AND ONE HYPERPLASTIC POLYP   DEPRESSION 03/04/2007   DERMATITIS, SEBORRHEIC 07/19/2007   Dysrhythmia    FIBRILLATION, ATRIAL 03/04/2007   Hearing aid worn    B/L   History of kidney stones    HYPERLIPIDEMIA 03/04/2007   HYPERTENSION 03/04/2007   Lung cancer (HCC)    non small cell lung ca   Lung mass    mediastinal adenopathy    Past Surgical History:  Procedure Laterality Date   BIOPSY  01/09/2020   Procedure: BIOPSY;  Surgeon: Neda Jennet LABOR, MD;  Location: [facility];  Service: Endoscopy;;   BRONCHIAL WASHINGS  01/09/2020   Procedure: BRONCHIAL WASHINGS;  Surgeon: Neda Jennet LABOR, MD;  Location: [facility];  Service: Endoscopy;;   CATARACT EXTRACTION W/ INTRAOCULAR LENS IMPLANT     right   COLONOSCOPY     IR IMAGING GUIDED PORT INSERTION  01/31/2019   JOINT REPLACEMENT  06/23/2023   PARTIAL KNEE ARTHROPLASTY Right 06/23/2023   Procedure: UNICOMPARTMENTAL KNEE-MEDIAL;  Surgeon: [name] Toribio LABOR, MD;  Location: [facility];  Service: Orthopedics;  Laterality: Right;   RADIOFREQUENCY ABLATION     x2 for fib, on 2nd attempt switched form a flutter ot a fib.    ROTATOR CUFF REPAIR Right    TONSILLECTOMY     VIDEO BRONCHOSCOPY N/A 01/09/2020   Procedure: VIDEO BRONCHOSCOPY WITH FLUORO;  Surgeon: Neda Jennet LABOR, MD;  Location: [facility];  Service: Endoscopy;  Laterality: N/A;   VIDEO BRONCHOSCOPY WITH ENDOBRONCHIAL ULTRASOUND Left 12/30/2018   Procedure: VIDEO BRONCHOSCOPY WITH ENDOBRONCHIAL ULTRASOUND WITH FLUOROSCOPY;  Surgeon: Neda Jennet LABOR, MD;  Location: [facility] OR;  Service: Thoracic;  Laterality: Left;   WISDOM TOOTH EXTRACTION      Current Outpatient  Medications  Medication Sig Dispense Refill   amLODipine  (NORVASC ) 5 MG tablet TAKE 1 TABLET BY MOUTH DAILY 100 tablet 2   atenolol  (TENORMIN ) 50 MG tablet TAKE 1 TABLET BY MOUTH TWICE  DAILY 200 tablet 2   atorvastatin  (LIPITOR) 10 MG tablet TAKE 1 TABLET BY MOUTH DAILY 100 tablet 2   Multiple Vitamin (MULTIVITAMIN WITH MINERALS) TABS tablet Take 1 tablet by mouth daily.     Multiple Vitamins-Minerals (PRESERVISION AREDS 2) CAPS Take 1 capsule by mouth 2 (two) times daily.     XARELTO  20 MG TABS tablet TAKE 1 TABLET BY MOUTH DAILY  WITH SUPPER 100 tablet 2   No current facility-administered medications for this visit.    Allergies as of 01/28/2024 - Review Complete 01/28/2024  Allergen Reaction Noted   Hydrocodone  Other (See Comments) 09/18/2019    Family History  Problem Relation Age of Onset   Osteoporosis Mother    Dementia Mother    Lung cancer Father        former smoker   Cancer Father    Colon cancer Neg Hx    Stomach cancer Neg Hx    Esophageal cancer Neg Hx  Rectal cancer Neg Hx     Social History   Tobacco Use   Smoking status: Former    Current packs/day: 0.00    Average packs/day: 1.5 packs/day for 10.0 years (15.0 ttl pk-yrs)    Types: Cigarettes, Pipe    Start date: 07/09/1967    Quit date: 07/08/1977    Years since quitting: 46.5   Smokeless tobacco: Never   Tobacco comments:    quit 1979  Vaping Use   Vaping status: Never Used  Substance Use Topics   Alcohol use: Yes    Alcohol/week: 28.0 standard drinks of alcohol    Types: 28 Standard drinks or equivalent per week    Comment: 4 scotch's a day   Drug use: Never     Review of Systems:    Constitutional: No weight loss, fever, chills. Positive fatigue. Cardiovascular: No chest pain Respiratory: Positive shortness of breath with exertion Gastrointestinal: See HPI and otherwise negative   Physical Exam:  Vital signs: BP 108/62   Pulse 82   Ht 5' 10 (1.778 m)   Wt 205 lb 4 oz (93.1 kg)    BMI 29.45 kg/m   Wt Readings from Last 3 Encounters:  01/28/24 205 lb 4 oz (93.1 kg)  01/26/24 205 lb (93 kg)  01/07/24 209 lb (94.8 kg)     Constitutional: Pleasant, well-appearing male in NAD, alert and cooperative Head:  Normocephalic and atraumatic.  Eyes: No scleral icterus.  Mouth: No oral lesions. Respiratory: Respirations even and unlabored. Lungs clear to auscultation bilaterally.  No wheezes, crackles, or rhonchi.  Cardiovascular: Regular rate, irregularly irregular rhythm. No murmurs. No peripheral edema. Gastrointestinal:  Soft, nondistended, nontender. No rebound or guarding. Normal bowel sounds. No appreciable masses or hepatomegaly. Rectal:  Not performed.  Neurologic:  Alert and oriented x4;  grossly normal neurologically.  Skin:   Dry and intact without significant lesions or rashes. Psychiatric: Oriented to person, place and time. Demonstrates good judgement and reason without abnormal affect or behaviors.   RELEVANT LABS AND IMAGING: CBC    Component Value Date/Time   WBC 5.8 01/21/2024 0952   RBC 4.04 (L) 01/21/2024 0952   HGB 10.9 (L) 01/21/2024 0952   HGB 10.0 (L) 01/19/2024 1156   HCT 34.3 (L) 01/21/2024 0952   PLT 205.0 01/21/2024 0952   PLT 213 01/19/2024 1156   MCV 85.0 01/21/2024 0952   MCH 27.0 01/19/2024 1156   MCHC 31.6 01/21/2024 0952   RDW 16.8 (H) 01/21/2024 0952   LYMPHSABS 1.2 01/21/2024 0952   MONOABS 0.8 01/21/2024 0952   EOSABS 0.1 01/21/2024 0952   BASOSABS 0.0 01/21/2024 0952    CMP     Component Value Date/Time   NA 133 (L) 01/19/2024 1156   K 4.7 01/19/2024 1156   CL 103 01/19/2024 1156   CO2 26 01/19/2024 1156   GLUCOSE 110 (H) 01/19/2024 1156   GLUCOSE 108 (H) 03/25/2006 1043   BUN 15 01/19/2024 1156   CREATININE 1.11 01/19/2024 1156   CALCIUM  9.1 01/19/2024 1156   PROT 6.7 01/19/2024 1156   ALBUMIN 4.0 01/19/2024 1156   AST 17 01/19/2024 1156   ALT 13 01/19/2024 1156   ALKPHOS 64 01/19/2024 1156   BILITOT 0.6  01/19/2024 1156   GFRNONAA >60 01/19/2024 1156   GFRAA >60 12/20/2019 1028   GFRAA >60 10/05/2019 1027   Echocardiogram 05/12/2023 1. Left ventricular ejection fraction, by estimation, is 60 to 65% . The left ventricle has normal function. The left ventricle has  no regional wall motion abnormalities. There is mild left ventricular hypertrophy. Left ventricular diastolic parameters are indeterminate. The average left ventricular global longitudinal strain is - 13. 3 % . The global longitudinal strain is abnormal.  2. Right ventricular systolic function is normal. The right ventricular size is normal.  3. Left atrial size was severely dilated.  4. The mitral valve is normal in structure. No evidence of mitral valve regurgitation. No evidence of mitral stenosis.  5. The aortic valve is normal in structure. Aortic valve regurgitation is not visualized. No aortic stenosis is present.  6. The inferior vena cava is normal in size with greater than 50% respiratory variability, suggesting right atrial pressure of 3 mmHg.  Assessment/Plan:   Anemia Heme positive stool Chronic anticoagulation Esophageal dysphagia History of colon polyps Dyspnea on exertion Patient seen today having been referred by PCP for evaluation of anemia with FOBT positive stool 01/25/2024.  Patient has history of colon polyps, reports it has been a few years since his last colonoscopy.  States he had a colonoscopy done at [facility], per chart review was done 04/17/2020 with polypectomy performed.  Reports he has regular bowel movements and is not seeing any blood in his stool or melena. Reports occasional trouble swallowing pills which feel stuck in his throat.  Occasional heartburn. Had knee replacement surgery in March and states that since surgery he has been having some shortness of breath, though he continues to remain active and exercises regularly.  Increased fatigue.  - Schedule EGD/colonoscopy. I thoroughly discussed the  procedure with the patient to include nature of the procedure, alternatives, benefits, and risks (including but not limited to bleeding, infection, perforation, anesthesia/cardiac/pulmonary complications). Patient verbalized understanding and gave verbal consent to proceed with procedure.  - Request cardiac clearance due to shortness of breath with exertion.  Though had an echocardiogram in February which was reassuring, shortness of breath is new/worse since knee surgery in March. - Request Xarelto  hold   Camie Furbish, PA-C [facility] 01/28/2024, 11:01 AM  Patient Care Team: Katrinka Garnette KIDD, MD as PCP - General (Family Medicine) Kate Lonni CROME, MD as PCP - Cardiology (Cardiology) Carlin Dickens as Consulting Physician (Dentistry) Evertt Lonell BROCKS, RN as Oncology Nurse Navigator Klein, Sherlean CROME, [facility] (Inactive) (Pharmacist)

## 2024-01-28 NOTE — Telephone Encounter (Signed)
 Pharmacy please advise on holding Xarelto  prior to endoscopy procedure scheduled for 02/29/2024. Thank you.

## 2024-01-28 NOTE — Telephone Encounter (Signed)
 [facility] Pre-operative Risk Assessment     Request for surgical clearance:     Endoscopy Procedure  What type of surgery is being performed?     ECL  When is this surgery scheduled?     02/29/24  What type of clearance is required ?   Pharmacy AND cardiac  Are there any medications that need to be held prior to surgery and how long? Xarelto  2 days and cardiac clearance for SOB with exertion.   Practice name and name of physician performing surgery?      [facility]  What is your office phone and fax number?      Phone- [phone]  Fax- [phone]  Anesthesia type (None, local, MAC, general) ?       MAC   Please route your response to Corean Amsterdam, [facility]

## 2024-01-28 NOTE — Patient Instructions (Addendum)
 You have been scheduled for an endoscopy and colonoscopy. Please follow the written instructions given to you at your visit today.  If you use inhalers (even only as needed), please bring them with you on the day of your procedure.  DO NOT TAKE 7 DAYS PRIOR TO TEST- Trulicity (dulaglutide) Ozempic, Wegovy (semaglutide) Mounjaro (tirzepatide) Bydureon Bcise (exanatide extended release)  DO NOT TAKE 1 DAY PRIOR TO YOUR TEST Rybelsus (semaglutide) Adlyxin (lixisenatide) Victoza (liraglutide) Byetta (exanatide) ________________________________________________________________________

## 2024-01-30 ENCOUNTER — Encounter: Payer: Self-pay | Admitting: Gastroenterology

## 2024-01-30 NOTE — Progress Notes (Signed)
 ____________________________________________________________  Attending physician addendum:  Thank you for sending this case to me. I have reviewed the entire note and agree with the plan, with the following additional advice:  Camie, I agree with endoscopic testing, though not yet clear to what extent this FOBT positive stool is contributing to his anemia, esp without iron levels on file.   The degree of worsening dyspnea reported seems greater than expected for the degree of anemia, though of course he has underlying cardiac and pulmonary disease.  His EGD/colonoscopy is currently scheduled for 4 weeks from now, but in my opinion he needs to be seen by Pulmonary in addition to Cardiology prior to our anesthesia provider's evaluation in advance of that date.   I've included PCP and Cardiology on this for assistance with care coordination.  Please see who evaluated him for Pulmonary so we can bring them in as well,  With all that evaluation needed, procedures will likely need to to be moved back unless all can be done in next couple of weeks (with holiday in there as well)   Victory Brand, MD  ____________________________________________________________

## 2024-01-31 ENCOUNTER — Telehealth: Payer: Self-pay | Admitting: Gastroenterology

## 2024-01-31 NOTE — Telephone Encounter (Signed)
 Attempted to reach patient.  Patient has active MyChart. Sent message explaining need of clearance by cardiology and pulmonology  before proceeding with Endo/Colon and to call back if further questions regarding this.

## 2024-01-31 NOTE — Telephone Encounter (Signed)
 Dr. Legrand has advised patient have pulmonary as well as cardiac clearance prior to EGD/colonoscopy.  Needs to be evaluated by pulmonology in office.  Please send referral for clearance.  It looks like he may have seen Dr. Tobie at [facility] previously, please confirm if this is his current pulmonologist.  We will likely need to postpone procedures (currently on for 12/2) since he will need further evaluation from pulmonology and cardiology.

## 2024-02-01 ENCOUNTER — Other Ambulatory Visit: Payer: Self-pay

## 2024-02-01 ENCOUNTER — Telehealth: Payer: Self-pay

## 2024-02-01 NOTE — Telephone Encounter (Signed)
  Faxed request for pulmonology clearance from Dr Pauline Floreen Blanch after confirming with the office that the patient is in fact established with Dr Blanch.   Fax [phone]

## 2024-02-03 ENCOUNTER — Other Ambulatory Visit: Payer: Self-pay | Admitting: Family Medicine

## 2024-02-04 ENCOUNTER — Telehealth: Payer: Self-pay

## 2024-02-04 NOTE — Telephone Encounter (Signed)
 2nd request for pulmonary clearance on patient faxed to [facility] [place]  Dr Tobie at [phone]

## 2024-02-08 ENCOUNTER — Other Ambulatory Visit

## 2024-02-08 DIAGNOSIS — Z006 Encounter for examination for normal comparison and control in clinical research program: Secondary | ICD-10-CM

## 2024-02-11 NOTE — Telephone Encounter (Signed)
 Thank you for the update on this patient, Beth.  Unfortunate reply from this patient's pulmonologist, especially considering this patient's worsening dyspnea as outlined in our recent office note.  As indicated in my addendum to Sara's recent office note on 01/28/2024, this patient needs to be seen by his cardiologist soon.  He does not have an appointment scheduled as near as I can tell.  (His cardiologist was copied on our recent office note, but I have copied them on this as well)  He also needs to be seen by his pulmonologist, and I strongly recommend that Mr. Cookston get in touch with that office to set an appointment.  Please fax our recent office note to this patient's pulmonologist again, clearly indicating this patient is reporting worsening dyspnea and that we are requesting he be evaluated for that.  As such, Mr. Krist upcoming EGD and colonoscopy with me need to be canceled. I hope to hear from this patient's cardiologist with some copy of notes after they see him.  We will need documentation from his pulmonologist as well.  With that we can then decide the appropriate timing and location for his endoscopic procedures. Please set a clinical reminder to check on this chart in a month.  -  - Victory Brand, MD    [facility]

## 2024-02-11 NOTE — Telephone Encounter (Signed)
 Patient needing to be further advised. Scheduled for o/v with provider 12/5. Please further advise.   Thank you

## 2024-02-11 NOTE — Telephone Encounter (Signed)
Please see the message.

## 2024-02-11 NOTE — Telephone Encounter (Signed)
 Called to discuss with the patient. No answer. Left a message asking he return my call. Procedures canceled.

## 2024-02-11 NOTE — Telephone Encounter (Signed)
 Inbound call from Dr. Tobie stating he does not think it is necessary for him to complete risk assessment for patient. States he has not seen patient in 2 years, states patient has graduated from clinic due to being well. Does not wish to received another fax. Please advise, thank you

## 2024-02-14 NOTE — Telephone Encounter (Signed)
 Called the patient. No answer. Left message advising he will need to be seen in office by his cardiologist and his pulmonologist before he can be scheduled for his procedure with Dr Legrand. Offered to assist with getting a pulmonologist if he wanted.

## 2024-02-15 ENCOUNTER — Other Ambulatory Visit: Payer: Self-pay

## 2024-02-15 DIAGNOSIS — R0609 Other forms of dyspnea: Secondary | ICD-10-CM

## 2024-02-15 DIAGNOSIS — Z87891 Personal history of nicotine dependence: Secondary | ICD-10-CM

## 2024-02-15 NOTE — Telephone Encounter (Signed)
 Yes, he can be referred to [facility] pulmonary.  We will see if they will accept him as a new patient with him having seen an outside pulmonary consultant.  Again, he can also call his current pulmonary doc.  Apologies we cannot make that or a cardiology visit happen sooner.  - HD

## 2024-02-15 NOTE — Telephone Encounter (Signed)
 Has a decision been made of this request? Thank you

## 2024-02-15 NOTE — Telephone Encounter (Signed)
 PT is calling to further discuss the need for a cardiologist and pulmonologist. Please advise.

## 2024-02-15 NOTE — Telephone Encounter (Signed)
 Referral placed. Re-routed the requested for cardiac clearance to [facility].

## 2024-02-15 NOTE — Telephone Encounter (Signed)
 Spoke with the patient. He expresses frustration with the response time to get clearances.  He was last seen by cardiology in February 2025. I will resend the request for clearance to [facility].  He would like a referral to a local pulmonologist. Are you okay with this?

## 2024-02-16 NOTE — Telephone Encounter (Signed)
   Name: Martin Bailey  DOB: 08-06-1940  MRN: [id number]  Primary Cardiologist: Martin LITTIE Nanas, MD   Preoperative team, please contact this patient and set up a phone call appointment for further preoperative risk assessment. Please obtain consent and complete medication review. Thank you for your help.  I confirm that guidance regarding antiplatelet and oral anticoagulation therapy has been completed and, if necessary, noted below.  Patient has not had an Afib/aflutter ablation in the last 3 months, DCCV within the last 4 weeks or a watchman implanted in the last 45 days    Per office protocol, patient can hold Xarelto  for 2 days prior to procedure.    I also confirmed the patient resides in the state of [place] . As per [facility] Medical Board telemedicine laws, the patient must reside in the state in which the provider is licensed.   Martin Satterfield, NP 02/16/2024, 11:28 AM [facility]

## 2024-02-16 NOTE — Telephone Encounter (Signed)
 Left the patient a detailed message for pre-op clearance. First attempt.

## 2024-02-16 NOTE — Telephone Encounter (Signed)
 Patient with diagnosis of afib on Xarelto  for anticoagulation.    Procedure:      ECL  Date of procedure: 02/29/24   CHA2DS2-VASc Score = 4   This indicates a 4.8% annual risk of stroke. The patient's score is based upon: CHF History: 0 HTN History: 1 Diabetes History: 0 Stroke History: 0 Vascular Disease History: 1 Age Score: 2 Gender Score: 0      CrCl 63 ml/min Platelet count 205  Patient has not had an Afib/aflutter ablation in the last 3 months, DCCV within the last 4 weeks or a watchman implanted in the last 45 days   Per office protocol, patient can hold Xarelto  for 2 days prior to procedure.    **This guidance is not considered finalized until pre-operative APP has relayed final recommendations.**

## 2024-02-17 NOTE — Telephone Encounter (Signed)
 Pt returning call - he asked if you could call back cell [phone]

## 2024-02-17 NOTE — Telephone Encounter (Signed)
2 nd attempt to reach pt to set up tele pre op appt. 

## 2024-02-17 NOTE — Telephone Encounter (Addendum)
 Patient is scheduled for pre-op clearance on 02/22/24 with Katlyn West, NP or Delon Hoover, NP. Med list and consent done.     Patient Consent for Virtual Visit        Martin Bailey has provided verbal consent on 02/21/2024 for a virtual visit (video or telephone).   CONSENT FOR VIRTUAL VISIT FOR:  Martin Bailey  By participating in this virtual visit I agree to the following:  I hereby voluntarily request, consent and authorize [facility] and its employed or contracted physicians, physician assistants, nurse practitioners or other licensed health care professionals (the Practitioner), to provide me with telemedicine health care services (the "Services) as deemed necessary by the treating Practitioner. I acknowledge and consent to receive the Services by the Practitioner via telemedicine. I understand that the telemedicine visit will involve communicating with the Practitioner through live audiovisual communication technology and the disclosure of certain medical information by electronic transmission. I acknowledge that I have been given the opportunity to request an in-person assessment or other available alternative prior to the telemedicine visit and am voluntarily participating in the telemedicine visit.  I understand that I have the right to withhold or withdraw my consent to the use of telemedicine in the course of my care at any time, without affecting my right to future care or treatment, and that the Practitioner or I may terminate the telemedicine visit at any time. I understand that I have the right to inspect all information obtained and/or recorded in the course of the telemedicine visit and may receive copies of available information for a reasonable fee.  I understand that some of the potential risks of receiving the Services via telemedicine include:  Delay or interruption in medical evaluation due to technological equipment failure or  disruption; Information transmitted may not be sufficient (e.g. poor resolution of images) to allow for appropriate medical decision making by the Practitioner; and/or  In rare instances, security protocols could fail, causing a breach of personal health information.  Furthermore, I acknowledge that it is my responsibility to provide information about my medical history, conditions and care that is complete and accurate to the best of my ability. I acknowledge that Practitioner's advice, recommendations, and/or decision may be based on factors not within their control, such as incomplete or inaccurate data provided by me or distortions of diagnostic images or specimens that may result from electronic transmissions. I understand that the practice of medicine is not an exact science and that Practitioner makes no warranties or guarantees regarding treatment outcomes. I acknowledge that a copy of this consent can be made available to me via my patient portal [facility] MyChart), or I can request a printed copy by calling the office of [facility].    I understand that my insurance will be billed for this visit.   I have read or had this consent read to me. I understand the contents of this consent, which adequately explains the benefits and risks of the Services being provided via telemedicine.  I have been provided ample opportunity to ask questions regarding this consent and the Services and have had my questions answered to my satisfaction. I give my informed consent for the services to be provided through the use of telemedicine in my medical care

## 2024-02-18 NOTE — Telephone Encounter (Signed)
 Beth,  There have been so many messages and different people involved in this that it is unclear why or when the December 5 appointment was set.  I would like to see him in the office, and I can do that on December 5 if he would like to do so.  However, it probably makes more sense to do so after has seen the pulmonary consultant.  So if he would rather do that, then I can do it in a 2:40 PM banding slot on December 16. That way he will have seen both pulmonary and cardiology and hopefully we can get his procedures rescheduled when I see him in clinic.   VEAR Brand MD

## 2024-02-18 NOTE — Telephone Encounter (Signed)
 Left patient a message to call me back to discuss this.

## 2024-02-18 NOTE — Telephone Encounter (Addendum)
 Update Patient will see cardiology on 02/22/24. He will see [facility] as a new patient on 03/09/24. He has an appointment with you on 03/03/24 but I am not certain why. I do know he has been very upset about the situation. Maybe he asked to see you.  Do you want to see him on 03/03/24?

## 2024-02-21 LAB — GENECONNECT MOLECULAR SCREEN: Genetic Analysis Overall Interpretation: NEGATIVE

## 2024-02-22 ENCOUNTER — Ambulatory Visit: Admitting: Cardiology

## 2024-02-22 DIAGNOSIS — Z01810 Encounter for preprocedural cardiovascular examination: Secondary | ICD-10-CM

## 2024-02-22 DIAGNOSIS — Z01818 Encounter for other preprocedural examination: Secondary | ICD-10-CM

## 2024-02-22 NOTE — Progress Notes (Signed)
 Virtual Visit via Telephone Note   Because of Martin Bailey co-morbid illnesses, he is at least at moderate risk for complications without adequate follow up.  This format is felt to be most appropriate for this patient at this time.  Due to technical limitations with video connection (technology), today's appointment will be conducted as an audio only telehealth visit, and Martin  Bailey verbally agreed to proceed in this manner.   All issues noted in this document were discussed and addressed.  No physical exam could be performed with this format.  Evaluation Performed:  Preoperative cardiovascular risk assessment _____________   Date:  02/22/2024   Patient ID:  Martin Bailey, DOB January 20, 1941, MRN [id number] Patient Location:  Home Provider location:   Office  Primary Care Provider:  Katrinka Garnette KIDD, MD Primary Cardiologist:  Lonni LITTIE Nanas, MD  Chief Complaint / Patient Profile   83 y.o. y/o male with a h/o permanent atrial fibrillation, history of paroxysmal atrial flutter s/p atrial flutter ablation x 2 on Xarelto , dyslipidemia, nonobstructive CAD who is pending EGD/colonoscopy  and presents today for telephonic preoperative cardiovascular risk assessment.  History of Present Illness    Martin Bailey is a 83 y.o. male who presents via audio/video conferencing for a telehealth visit today.  Pt was last seen in cardiology clinic on 05/11/2023 by Dr. Bernie.  At that time Martin Bailey was doing well, pending knee surgery at that time and was doing well from a cardiac perspective.  The patient is now pending procedure as outlined above. Since his last visit, he has been doing well overall.  He continues to workout several days a week and stay very physically active.  He has been dealing with fatigue, follows with oncology and hemoglobin has been dropping prompting upcoming EGD and colonoscopy.     Past Medical History    Past Medical  History:  Diagnosis Date   Arthritis    Cataract    left   COLONIC POLYPS, HX OF 04/05/2007   ONE ADENOMATOUS POLYP AND ONE HYPERPLASTIC POLYP   DEPRESSION 03/04/2007   DERMATITIS, SEBORRHEIC 07/19/2007   Dysrhythmia    FIBRILLATION, ATRIAL 03/04/2007   Hearing aid worn    B/L   History of kidney stones    HYPERLIPIDEMIA 03/04/2007   HYPERTENSION 03/04/2007   Lung cancer (HCC)    non small cell lung ca   Lung mass    mediastinal adenopathy   Past Surgical History:  Procedure Laterality Date   BIOPSY  01/09/2020   Procedure: BIOPSY;  Surgeon: Neda Jennet LABOR, MD;  Location: [facility];  Service: Endoscopy;;   BRONCHIAL WASHINGS  01/09/2020   Procedure: BRONCHIAL WASHINGS;  Surgeon: Neda Jennet LABOR, MD;  Location: [facility];  Service: Endoscopy;;   CATARACT EXTRACTION W/ INTRAOCULAR LENS IMPLANT     right   COLONOSCOPY     IR IMAGING GUIDED PORT INSERTION  01/31/2019   JOINT REPLACEMENT  06/23/2023   PARTIAL KNEE ARTHROPLASTY Right 06/23/2023   Procedure: UNICOMPARTMENTAL KNEE-MEDIAL;  Surgeon: [name] Toribio LABOR, MD;  Location: [facility];  Service: Orthopedics;  Laterality: Right;   RADIOFREQUENCY ABLATION     x2 for fib, on 2nd attempt switched form a flutter ot a fib.    ROTATOR CUFF REPAIR Right    TONSILLECTOMY     VIDEO BRONCHOSCOPY N/A 01/09/2020   Procedure: VIDEO BRONCHOSCOPY WITH FLUORO;  Surgeon: Neda Jennet LABOR, MD;  Location: [facility];  Service: Endoscopy;  Laterality: N/A;  VIDEO BRONCHOSCOPY WITH ENDOBRONCHIAL ULTRASOUND Left 12/30/2018   Procedure: VIDEO BRONCHOSCOPY WITH ENDOBRONCHIAL ULTRASOUND WITH FLUOROSCOPY;  Surgeon: Neda Jennet LABOR, MD;  Location: [facility];  Service: Thoracic;  Laterality: Left;   WISDOM TOOTH EXTRACTION      Allergies  Allergies  Allergen Reactions   Hydrocodone  Other (See Comments)    Lost my appetite, loss of energy     Home Medications    Prior to Admission medications   Medication Sig Start Date End  Date Taking? Authorizing Provider  amLODipine  (NORVASC ) 5 MG tablet TAKE 1 TABLET BY MOUTH DAILY 02/03/24   Katrinka Garnette KIDD, MD  atenolol  (TENORMIN ) 50 MG tablet TAKE 1 TABLET BY MOUTH TWICE  DAILY 02/03/24   Katrinka Garnette KIDD, MD  atorvastatin  (LIPITOR) 10 MG tablet TAKE 1 TABLET BY MOUTH DAILY 02/03/24   Katrinka Garnette KIDD, MD  Multiple Vitamin (MULTIVITAMIN WITH MINERALS) TABS tablet Take 1 tablet by mouth daily.    [provider]  Multiple Vitamins-Minerals (PRESERVISION AREDS 2) CAPS Take 1 capsule by mouth 2 (two) times daily.    [provider]  XARELTO  20 MG TABS tablet TAKE 1 TABLET BY MOUTH DAILY  WITH SUPPER 07/28/23   Katrinka Garnette KIDD, MD    Physical Exam    Vital Signs:  Martin Bailey does not have vital signs available for review today.  Given telephonic nature of communication, physical exam is limited. AAOx3. NAD. Normal affect.  Speech and respirations are unlabored.  Accessory Clinical Findings    None  Assessment & Plan    1.  Preoperative Cardiovascular Risk Assessment:    [name]'s perioperative risk of a major cardiac event is 0.4% according to the Revised Cardiac Risk Index (RCRI).  Therefore, he is at low risk for perioperative complications.   His functional capacity is excellent at 5.38 METs according to the Duke Activity Status Index (DASI). Recommendations: According to ACC/AHA guidelines, no further cardiovascular testing needed.  The patient may proceed to surgery at acceptable risk.   Antiplatelet and/or Anticoagulation Recommendations:  Xarelto  (Rivaroxaban ) can be held for 2 days prior to surgery.  Please resume post op when felt to be safe.        A copy of this note will be routed to requesting surgeon.  Time:   Today, I have spent 10 minutes with the patient with telehealth technology discussing medical history, symptoms, and management plan.     Delon JAYSON Hoover, NP  02/22/2024, 12:03 PM

## 2024-02-23 NOTE — Telephone Encounter (Signed)
 Patient contacted. Appointment moved to after his consult.

## 2024-02-29 ENCOUNTER — Encounter: Admitting: Gastroenterology

## 2024-03-03 ENCOUNTER — Ambulatory Visit: Admitting: Gastroenterology

## 2024-03-06 ENCOUNTER — Other Ambulatory Visit (HOSPITAL_BASED_OUTPATIENT_CLINIC_OR_DEPARTMENT_OTHER): Payer: Self-pay

## 2024-03-06 ENCOUNTER — Encounter (HOSPITAL_BASED_OUTPATIENT_CLINIC_OR_DEPARTMENT_OTHER): Payer: Self-pay | Admitting: Pulmonary Disease

## 2024-03-06 ENCOUNTER — Encounter: Payer: Self-pay | Admitting: Internal Medicine

## 2024-03-06 ENCOUNTER — Ambulatory Visit (INDEPENDENT_AMBULATORY_CARE_PROVIDER_SITE_OTHER)

## 2024-03-06 ENCOUNTER — Ambulatory Visit (HOSPITAL_BASED_OUTPATIENT_CLINIC_OR_DEPARTMENT_OTHER): Admitting: Pulmonary Disease

## 2024-03-06 VITALS — BP 124/76 | HR 74 | Ht 70.0 in | Wt 202.7 lb

## 2024-03-06 DIAGNOSIS — J984 Other disorders of lung: Secondary | ICD-10-CM

## 2024-03-06 DIAGNOSIS — C3491 Malignant neoplasm of unspecified part of right bronchus or lung: Secondary | ICD-10-CM

## 2024-03-06 DIAGNOSIS — Z87891 Personal history of nicotine dependence: Secondary | ICD-10-CM

## 2024-03-06 DIAGNOSIS — Z01811 Encounter for preprocedural respiratory examination: Secondary | ICD-10-CM

## 2024-03-06 DIAGNOSIS — T50905A Adverse effect of unspecified drugs, medicaments and biological substances, initial encounter: Secondary | ICD-10-CM

## 2024-03-06 LAB — PULMONARY FUNCTION TEST
DL/VA % pred: 87 %
DL/VA: 3.4 ml/min/mmHg/L
DLCO cor % pred: 72 %
DLCO cor: 17.64 ml/min/mmHg
DLCO unc % pred: 63 %
DLCO unc: 15.47 ml/min/mmHg
FEF 25-75 Pre: 1.2 L/s
FEF2575-%Pred-Pre: 62 %
FEV1-%Pred-Pre: 76 %
FEV1-Pre: 2.16 L
FEV1FVC-%Pred-Pre: 90 %
FEV6-%Pred-Pre: 89 %
FEV6-Pre: 3.32 L
FEV6FVC-%Pred-Pre: 106 %
FVC-%Pred-Pre: 84 %
FVC-Pre: 3.35 L
Pre FEV1/FVC ratio: 64 %
Pre FEV6/FVC Ratio: 99 %
RV % pred: 112 %
RV: 3 L
TLC % pred: 90 %
TLC: 6.41 L

## 2024-03-06 MED ORDER — BUDESONIDE-FORMOTEROL FUMARATE 80-4.5 MCG/ACT IN AERO: 2.0000 | INHALATION_SPRAY | Freq: Two times a day (BID) | RESPIRATORY_TRACT | 2 refills | Status: AC

## 2024-03-06 NOTE — Assessment & Plan Note (Addendum)
--  Pulmonary function test reviewed. Resolved restriction. Improved DLCO to 63% with mild diffusion  --START brand name Symbicort  80-4.5 mcg TWO puffs in the morning and evening. Rinse mouth out after use

## 2024-03-06 NOTE — Progress Notes (Signed)
 Full PFT w/o post bronchodilator performed today per Dr. Kassie.

## 2024-03-06 NOTE — Patient Instructions (Signed)
 Full PFT w/o post bronchodilator performed today per Dr. Kassie.

## 2024-03-06 NOTE — Patient Instructions (Signed)
 NSCLC of right lung (HCC) Stage III. S/p radiation --CT Chest 12/2023 with new left LLL lung nodule and subsolid nodules on right side --Planned for repeat CT 03/2024 with Oncology  Drug-induced pneumonitis --Pulmonary function test reviewed. Resolved restriction. Improved DLCO to 63% with mild --START brand name Symbicort  80-4.5 mcg TWO puffs in the morning and evening. Rinse mouth out after use  Preop pulmonary/respiratory exam ARISCAT Low risk 1.6% risk of in-hospital post-op pulmonary complications (composite including respiratory failure, respiratory infection, pleural effusion, atelectasis, pneumothorax, bronchospasm, aspiration pneumonitis)

## 2024-03-06 NOTE — Progress Notes (Signed)
 Subjective:   PATIENT ID: Martin Bailey GENDER: male DOB: 09-29-1940, MRN: [id number]  Chief Complaint  Patient presents with   Shortness of Breath    Reason for Visit: New consult for pre-op for EGD/colonoscopy     Martin Bailey is a 83 y.o. male former smoker with stage IIIB NSCLC squamous cancer s/p chemotherapy and IT, ILD secondary inhibitor pneumonitis, atrial fibrillation/flutter s/p ablation on Xarelto , HLD who presents for pulmonary pre-op visit.  Social History: [place] working and currently does it Was a warden/ranger (college), quarry manager, designer, jewellery.  Plays in [place] Quit smoking 4-5 years. 1ppd x 10 years.      03/06/2024 Discussed the use of AI scribe software for clinical note transcription with the patient, who gave verbal consent to proceed.  History of Present Illness  He has an upcoming EGD and colonoscopy. He has been seen by Cardiology and determined to have at least a moderate risk for cardiac complications. He is active at baseline. Has been advised to hold his Xarelto  2 days prior to surgery. He is followed by [facility] but has been lost to follow-up. Last visit on 04/10/22. He had been see previously for ILD secondary to checkpoint inhibitor pneumonitis, treated with steroids and Symbicort . Last treated with steroids on 02/2020 for a few weeks. He no longer is on Symbicort . Per GI note he has had worsening shortness of breath.   He experiences shortness of breath, described as 'panting', particularly when climbing stairs. He does not have trouble breathing but feels more tired. He associates this with previous lung inflammation diagnosed two to three years ago, which was treated with Symbicort . He used Symbicort  for a period, tapering off and eventually discontinuing it about two years ago. Recently, he tried using an old inhaler without relief. No chest tightness, but he reports 'breathing heavily' rather than true  dyspnea. Denies cough or wheezing.  He has a history of stage 3B non-small cell lung cancer, treated with six weeks of low-dose chemotherapy and radiation, followed by immunotherapy. He has not had any cancer treatment since and undergoes regular scans two to three times a year, which have shown stability.  He quit smoking four to five years ago after smoking a pack a day for about ten years. His hobbies include woodworking, which involves exposure to dust, but he denies any other significant inhalation risks. He has a background as a warden/ranger and a quarry manager, and he played in the [employer] for 32 years, which he believes helped his lung function.  He underwent knee surgery earlier this year, which healed well, allowing him to resume playing golf by mid-June. Before his knee issues, he was active at the gym, doing 30 minutes on the treadmill regularly. He wants to return to this level of activity.     Past Medical History:  Diagnosis Date   Arthritis    Cataract    left   COLONIC POLYPS, HX OF 04/05/2007   ONE ADENOMATOUS POLYP AND ONE HYPERPLASTIC POLYP   DEPRESSION 03/04/2007   DERMATITIS, SEBORRHEIC 07/19/2007   Dysrhythmia    FIBRILLATION, ATRIAL 03/04/2007   Hearing aid worn    B/L   History of kidney stones    HYPERLIPIDEMIA 03/04/2007   HYPERTENSION 03/04/2007   Lung cancer (HCC)    non small cell lung ca   Lung mass    mediastinal adenopathy     Family History  Problem Relation Age of Onset  Osteoporosis Mother    Dementia Mother    Lung cancer Father        former smoker   Cancer Father    Colon cancer Neg Hx    Stomach cancer Neg Hx    Esophageal cancer Neg Hx    Rectal cancer Neg Hx      Social History   Occupational History   Occupation: retired  Tobacco Use   Smoking status: Former    Current packs/day: 0.00    Average packs/day: 1.5 packs/day for 10.0 years (15.0 ttl pk-yrs)    Types: Cigarettes, Pipe    Start date:  07/09/1967    Quit date: 07/08/1977    Years since quitting: 46.6   Smokeless tobacco: Never   Tobacco comments:    quit 1979  Vaping Use   Vaping status: Never Used  Substance and Sexual Activity   Alcohol use: Yes    Alcohol/week: 28.0 standard drinks of alcohol    Types: 28 Standard drinks or equivalent per week    Comment: 4 scotch's a day   Drug use: Never   Sexual activity: Not Currently    Allergies  Allergen Reactions   Hydrocodone  Other (See Comments)    Lost my appetite, loss of energy      Outpatient Medications Prior to Visit  Medication Sig Dispense Refill   amLODipine  (NORVASC ) 5 MG tablet TAKE 1 TABLET BY MOUTH DAILY 100 tablet 2   atenolol  (TENORMIN ) 50 MG tablet TAKE 1 TABLET BY MOUTH TWICE  DAILY 200 tablet 2   atorvastatin  (LIPITOR) 10 MG tablet TAKE 1 TABLET BY MOUTH DAILY 100 tablet 2   Multiple Vitamin (MULTIVITAMIN WITH MINERALS) TABS tablet Take 1 tablet by mouth daily.     Multiple Vitamins-Minerals (PRESERVISION AREDS 2) CAPS Take 1 capsule by mouth 2 (two) times daily.     XARELTO  20 MG TABS tablet TAKE 1 TABLET BY MOUTH DAILY  WITH SUPPER 100 tablet 2   No facility-administered medications prior to visit.    Review of Systems  Constitutional:  Positive for malaise/fatigue. Negative for chills, diaphoresis, fever and weight loss.  HENT:  Negative for congestion.   Respiratory:  Positive for shortness of breath. Negative for cough, hemoptysis, sputum production and wheezing.   Cardiovascular:  Negative for chest pain, palpitations and leg swelling.     Objective:   Vitals:   03/06/24 0830  BP: 124/76  Pulse: 74  SpO2: 100%  Weight: 202 lb 11.2 oz (91.9 kg)  Height: 5' 10 (1.778 m)   SpO2: 100 %  Physical Exam: General: Well-appearing, no acute distress HENT: [place], AT Eyes: EOMI, no scleral icterus Respiratory: Clear to auscultation bilaterally.  No crackles, wheezing or rales Cardiovascular: RRR, -M/R/G, no  JVD Extremities:-Edema,-tenderness Neuro: AAO x4, CNII-XII grossly intact Psych: Normal mood, normal affect  Data Reviewed:  Imaging:  CT Chest 01/05/20 - Similar/decrease in right lower lobe nodule, improved GGO, improved effusion to small right pleural effusion  CT Chest 09/09/20 - Evoluationary changes s/p radiation of right perihilar and right lower lobe. No recurrent tumor. Small right pleural effusion. Moderate pericardial effusion  CT Chest 01/07/21 - Post radiation fibrosis, no mediastinal or hilar adenoapthy, stable right pleural effusion  CT Chest 07/08/21 - Stable post radiation fibrosis/evolving RLL. No tumor recurrence. New 3.5 mm LL nodule  CT Chest 01/08/22 - Stable postradiation fibrosis. No tumor recurrence  CT Chest 07/10/22 Stable post radiation changes in RLL and perihilar, New RLL opacities  CT Chest 01/08/23 -  Post radiation scarring and small loculated pleural effusion. RLL GGO opacity resolved  CT Chest 07/13/23 - stable post-treatment scarring in right infrahilar and retrohilar lung with associated volume loss, RLL GGO, new  CT Chest 01/19/24 - s/p right sided radiation. New solid 7 mm soid nodule, waxing and waning right sided subsolid nodules. RLL GGO has resolved.  PFT: 02/27/20 FVC 3.14 (75%) FEV1 2.38 (79%) Ratio 76  TLC 73% DLCO 49% Interpretation: Mild restrictive defect with moderately reduced gas exchange  [facility] PFT results (obtained from 04/10/22 chart) Date FVC FEV1 FEV1/FVC TLC DLCO Other  04/02/2020 2.59/65.3 1.85/62.9 69 3.78/53 No AT 8.91/36 moderate restriction No bronchodilator response Severe diffusion  05/24/2020 3.55/89.4 2.31/78.3 65 11.48/46.4 normal spiro Severe diffusion impairment  08/30/20 3.74/94.5 2.51/85.3 67 11.89/48.1 Continued improving FVL Stable diffusion impairment, severe  03/14/21 3.80/96.5 2.52/86.4 66 11.55/46.9 Stable   03/06/24 FVC 3.35 (84%) FEV1 2.16 (76%) Ratio 64  TLC 90% DLCO 63% Interpretation: No obstructive or  restrictive defect. Mildly reduced gas exchange   Labs: CBC    Component Value Date/Time   WBC 5.8 01/21/2024 0952   RBC 4.04 (L) 01/21/2024 0952   HGB 10.9 (L) 01/21/2024 0952   HGB 10.0 (L) 01/19/2024 1156   HCT 34.3 (L) 01/21/2024 0952   PLT 205.0 01/21/2024 0952   PLT 213 01/19/2024 1156   MCV 85.0 01/21/2024 0952   MCH 27.0 01/19/2024 1156   MCHC 31.6 01/21/2024 0952   RDW 16.8 (H) 01/21/2024 0952   LYMPHSABS 1.2 01/21/2024 0952   MONOABS 0.8 01/21/2024 0952   EOSABS 0.1 01/21/2024 0952   BASOSABS 0.0 01/21/2024 0952   Absolute eos 01/21/24 -100, not significantly elevated     Assessment & Plan:   Discussion: 83 y.o. male former smoker with stage IIIB NSCLC squamous cancer s/p chemotherapy and IT, ILD secondary inhibitor pneumonitis, atrial fibrillation/flutter s/p ablation on Xarelto , HLD who presents for pulmonary pre-op visit. Reviewed history and imaging including oncology and [facility] notes.   Assessment & Plan NSCLC of right lung (HCC) Stage III. S/p radiation --CT Chest 12/2023 with new left LLL lung nodule and subsolid nodules on right side --Planned for repeat CT 03/2024 with Oncology Drug-induced pneumonitis --Pulmonary function test reviewed. Resolved restriction. Improved DLCO to 63% with mild diffusion  --START brand name Symbicort  80-4.5 mcg TWO puffs in the morning and evening. Rinse mouth out after use Preop pulmonary/respiratory exam ARISCAT Low risk 1.6% risk of in-hospital post-op pulmonary complications (composite including respiratory failure, respiratory infection, pleural effusion, atelectasis, pneumothorax, bronchospasm, aspiration pneumonitis)   Peri-operative Assessment of Pulmonary Risk for Non-Thoracic Surgery: EGD/colonoscopy  For Mr. Nabozny, risk of perioperative pulmonary complications is increased by:  [X]  Age greater than 65 years  [ ] COPD  [ ] Serum albumin <3.5  [ ] Smoking  [ ] Obstructive sleep apnea  [ ] NYHA Class II Pulmonary  Hypertension  Respiratory complications generally occur in 1% of ASA Class I patients, 5% of ASA Class II and 10% of ASA Class III-IV patients These complications rarely result in mortality and include postoperative pneumonia, atelectasis, pulmonary embolism, ARDS and increased time requiring postoperative mechanical ventilation.  Overall, I recommend proceeding with the surgery if the risk for respiratory complications are outweighed by the potential benefits. This will need to be discussed between the patient and surgeon.  To reduce risks of respiratory complications, I recommend: --Pre- and post-operative incentive spirometry performed frequently while awake --Avoiding/minimizing use of paralytics if possible during anesthesia.  1) RISK FOR PROLONGED MECHANICAL VENTILAION - > 48h  1A) Arozullah - Prolonged mech ventilation risk Arozullah Postperative Pulmonary Risk Score - for mech ventilation dependence >48h Usaa, Ann Surg 2000, major non-cardiac surgery) Comment Score  Type of surgery - abd ao aneurysm (27), thoracic (21), neurosurgery / upper abdominal / vascular (21), neck (11)    Emergency Surgery - (11)     ALbumin < 3 or poor nutritional state - (9)     BUN > 30 -  (8)    Partial or completely dependent functional status - (7)     COPD -  (6)    Age - 60 to 69 (4), > 70  (6) >70 6  TOTAL     Risk Stratifcation scores  - < 10 (0.5%), 11-19 (1.8%), 20-27 (4.2%), 28-40 (10.1%), >40 (26.6%)  <10 0.05% risk for MV dependence      I have discussed the risk factors and recommendations above with the patient.    Health Maintenance Immunization History  Administered Date(s) Administered   Fluad Quad(high Dose 65+) 01/16/2019, 12/23/2021, 12/13/2023   INFLUENZA, HIGH DOSE SEASONAL PF 01/02/2016, 01/21/2017, 02/10/2018   Influenza Nasal 01/07/2020   Influenza Split 12/22/2011   Influenza Whole 01/18/2008, 01/04/2009   Influenza,inj,Quad PF,6+ Mos 12/23/2012,  02/20/2014, 12/21/2014   Influenza-Unspecified 01/07/2021, 12/23/2022   Moderna Sars-Covid-2 Vaccination 04/29/2019, 05/27/2019, 01/31/2020, 07/17/2020, 01/07/2021   PFIZER Comirnaty(Gray Top)Covid-19 Tri-Sucrose Vaccine 12/23/2021   PFIZER(Purple Top)SARS-COV-2 Vaccination 08/02/2021   PNEUMOCOCCAL CONJUGATE-20 08/02/2021   Pfizer(Comirnaty)Fall Seasonal Vaccine 12 years and older 12/13/2022   Pneumococcal Conjugate-13 06/21/2014   Pneumococcal Polysaccharide-23 01/18/2008   RSV,unspecified 03/16/2022   Td 11/21/2001, 09/11/2021   Tdap 12/22/2011   Unspecified SARS-COV-2 Vaccination 12/13/2023   Zoster Recombinant(Shingrix) 02/03/2018, 04/07/2018   Zoster, Live 01/18/2008   CT Lung Screen - not qualified  Orders Placed This Encounter  Procedures   Pulmonary function test    Standing Status:   Future    Number of Occurrences:   1    Expiration Date:   03/06/2025    Where should this test be performed?:   Outpatient Pulmonary    What type of PFT is being ordered?:   Full PFT   Meds ordered this encounter  Medications   budesonide -formoterol  (SYMBICORT ) 80-4.5 MCG/ACT inhaler    Sig: Inhale 2 puffs into the lungs in the morning and at bedtime.    Dispense:  1 each    Refill:  2    Return in about 2 months (around 05/07/2024).   I have spent a total time of 60-minutes on the day of the appointment reviewing prior documentation, coordinating care and discussing medical diagnosis and plan with the patient/family. Imaging, labs and tests included in this note have been reviewed and interpreted independently by me. This note is generated using [employer] programming. Patient/family has given consent.  Billye Nydam Slater Staff, MD [facility] 03/06/2024 8:51 AM

## 2024-03-09 ENCOUNTER — Ambulatory Visit (HOSPITAL_BASED_OUTPATIENT_CLINIC_OR_DEPARTMENT_OTHER): Admitting: Pulmonary Disease

## 2024-03-14 ENCOUNTER — Ambulatory Visit: Admitting: Gastroenterology

## 2024-03-14 ENCOUNTER — Encounter: Payer: Self-pay | Admitting: Gastroenterology

## 2024-03-14 ENCOUNTER — Other Ambulatory Visit

## 2024-03-14 VITALS — BP 116/64 | HR 60 | Ht 70.0 in | Wt 299.5 lb

## 2024-03-14 DIAGNOSIS — R195 Other fecal abnormalities: Secondary | ICD-10-CM

## 2024-03-14 DIAGNOSIS — D649 Anemia, unspecified: Secondary | ICD-10-CM

## 2024-03-14 LAB — CBC WITH DIFFERENTIAL/PLATELET
Basophils Absolute: 0 K/uL (ref 0.0–0.1)
Basophils Relative: 0.6 % (ref 0.0–3.0)
Eosinophils Absolute: 0.2 K/uL (ref 0.0–0.7)
Eosinophils Relative: 2.8 % (ref 0.0–5.0)
HCT: 36.9 % — ABNORMAL LOW (ref 39.0–52.0)
Hemoglobin: 12.2 g/dL — ABNORMAL LOW (ref 13.0–17.0)
Lymphocytes Relative: 22 % (ref 12.0–46.0)
Lymphs Abs: 1.6 K/uL (ref 0.7–4.0)
MCHC: 33.2 g/dL (ref 30.0–36.0)
MCV: 90.7 fl (ref 78.0–100.0)
Monocytes Absolute: 0.7 K/uL (ref 0.1–1.0)
Monocytes Relative: 9.8 % (ref 3.0–12.0)
Neutro Abs: 4.8 K/uL (ref 1.4–7.7)
Neutrophils Relative %: 64.8 % (ref 43.0–77.0)
Platelets: 215 K/uL (ref 150.0–400.0)
RBC: 4.07 Mil/uL — ABNORMAL LOW (ref 4.22–5.81)
RDW: 23.5 % — ABNORMAL HIGH (ref 11.5–15.5)
WBC: 7.5 K/uL (ref 4.0–10.5)

## 2024-03-14 LAB — IBC + FERRITIN
Ferritin: 21.7 ng/mL — ABNORMAL LOW (ref 22.0–322.0)
Iron: 82 ug/dL (ref 42–165)
Saturation Ratios: 21.6 % (ref 20.0–50.0)
TIBC: 379.4 ug/dL (ref 250.0–450.0)
Transferrin: 271 mg/dL (ref 212.0–360.0)

## 2024-03-14 LAB — B12 AND FOLATE PANEL
Folate: >23.7 ng/mL (ref >5.9)
Vitamin B-12: 396 pg/mL (ref 211–911)

## 2024-03-14 NOTE — Progress Notes (Signed)
 [facility] Consult Note:  History: Martin Bailey 03/14/2024  Referring provider: Katrinka Garnette KIDD, MD  Reason for consult/chief complaint: Anemia (Pt. Reports he is very tired and has no energy. Walking across the room he states he gets tired. No other complaints today. )   Subjective  Prior history:  Normocytic anemia (without iron levels checked) with heme positive stool, evaluated at [facility] office visit 01/28/2024. Was scheduled for EGD and colonoscopy, but I recommended he be evaluated both by pulmonary and cardiology prior to that and consideration of anesthesia preop evaluation His cardiologist was good enough to do a telephone preprocedure evaluation with him on 02/22/2024 (with no further cardiac testing needed before procedure), but the pulmonologist he had not seen in the [facility] system declined to see him. Fortunately we were able to get him evaluated by the [place] [facility] on 03/06/2024 by Dr. Kassie.  She felt this patient was at low risk of complications from a pulmonary standpoint.  History of Present Illness  Martin Bailey was here today to discuss his anemia and heme positive stool. He is upset that I required him to see both cardiology and pulmonary prior to being scheduled for endoscopic procedures.  We discussed that some today, and I explained I felt it was medically appropriate and in the best interest of his anesthesia related safety. He started iron recently and also started the Symbicort  inhaler after seeing Dr. Kassie of pulmonary.  With 1 or both of those things he feels an improvement in the shortness of breath he would experience primarily when walking upstairs in his house. No abdominal pain, nausea vomiting dysphagia weight loss change in bowel habits or bloody/black stools   ROS:  Review of Systems Shortness of breath as described above No chest pain  Past Medical History: Past Medical History:  Diagnosis Date    Arthritis    Cataract    left   COLONIC POLYPS, HX OF 04/05/2007   ONE ADENOMATOUS POLYP AND ONE HYPERPLASTIC POLYP   DEPRESSION 03/04/2007   DERMATITIS, SEBORRHEIC 07/19/2007   Dysrhythmia    FIBRILLATION, ATRIAL 03/04/2007   Hearing aid worn    B/L   History of kidney stones    HYPERLIPIDEMIA 03/04/2007   HYPERTENSION 03/04/2007   Lung cancer (HCC)    non small cell lung ca   Lung mass    mediastinal adenopathy     Past Surgical History: Past Surgical History:  Procedure Laterality Date   BIOPSY  01/09/2020   Procedure: BIOPSY;  Surgeon: Martin Jennet LABOR, MD;  Location: [facility];  Service: Endoscopy;;   BRONCHIAL WASHINGS  01/09/2020   Procedure: BRONCHIAL WASHINGS;  Surgeon: Martin Jennet LABOR, MD;  Location: [facility];  Service: Endoscopy;;   CATARACT EXTRACTION W/ INTRAOCULAR LENS IMPLANT     right   COLONOSCOPY     IR IMAGING GUIDED PORT INSERTION  01/31/2019   JOINT REPLACEMENT  06/23/2023   PARTIAL KNEE ARTHROPLASTY Right 06/23/2023   Procedure: UNICOMPARTMENTAL KNEE-MEDIAL;  Surgeon: Martin Toribio LABOR, MD;  Location: [facility];  Service: Orthopedics;  Laterality: Right;   RADIOFREQUENCY ABLATION     x2 for fib, on 2nd attempt switched form a flutter ot a fib.    ROTATOR CUFF REPAIR Right    TONSILLECTOMY     VIDEO BRONCHOSCOPY N/A 01/09/2020   Procedure: VIDEO BRONCHOSCOPY WITH FLUORO;  Surgeon: Martin Jennet LABOR, MD;  Location: [facility];  Service: Endoscopy;  Laterality: N/A;   VIDEO BRONCHOSCOPY WITH ENDOBRONCHIAL ULTRASOUND  Left 12/30/2018   Procedure: VIDEO BRONCHOSCOPY WITH ENDOBRONCHIAL ULTRASOUND WITH FLUOROSCOPY;  Surgeon: Martin Jennet LABOR, MD;  Location: [facility];  Service: Thoracic;  Laterality: Left;   WISDOM TOOTH EXTRACTION       Family History: Family History  Problem Relation Age of Onset   Osteoporosis Mother    Dementia Mother    Lung cancer Father        former smoker   Cancer Father    Colon cancer Neg Hx    Stomach  cancer Neg Hx    Esophageal cancer Neg Hx    Rectal cancer Neg Hx     Social History: Social History   Socioeconomic History   Marital status: Widowed    Spouse name: Not on file   Number of children: 1   Years of education: Not on file   Highest education level: Doctorate  Occupational History   Occupation: retired  Tobacco Use   Smoking status: Former    Current packs/day: 0.00    Average packs/day: 1.5 packs/day for 10.0 years (15.0 ttl pk-yrs)    Types: Cigarettes, Pipe    Start date: 07/09/1967    Quit date: 07/08/1977    Years since quitting: 46.7   Smokeless tobacco: Never   Tobacco comments:    quit 1979  Vaping Use   Vaping status: Never Used  Substance and Sexual Activity   Alcohol use: Yes    Alcohol/week: 28.0 standard drinks of alcohol    Types: 28 Standard drinks or equivalent per week    Comment: 4 scotch's a day   Drug use: Never   Sexual activity: Not Currently  Other Topics Concern   Not on file  Social History Narrative   Widower-wife died 78. 1 daughter. 2 grandkids. 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       Hobbies: golf, bridge, computer, sports, woodworking   Social Drivers of Health   Tobacco Use: Medium Risk (03/14/2024)   Patient History    Smoking Tobacco Use: Former    Smokeless Tobacco Use: Never    Passive Exposure: Not on file  Financial Resource Strain: Low Risk (09/16/2023)   Overall Financial Resource Strain (CARDIA)    Difficulty of Paying Living Expenses: Not hard at all  Food Insecurity: No Food Insecurity (09/16/2023)   Epic    Worried About Programme Researcher, Broadcasting/film/video in the Last Year: Never true    Ran Out of Food in the Last Year: Never true  Transportation Needs: No Transportation Needs (09/16/2023)   Epic    Lack of Transportation (Medical): No    Lack of Transportation (Non-Medical): No  Physical  Activity: Sufficiently Active (09/16/2023)   Exercise Vital Sign    Days of Exercise per Week: 3 days    Minutes of Exercise per Session: 50 min  Stress: No Stress Concern Present (09/16/2023)   [employer] of Occupational Health - Occupational Stress Questionnaire    Feeling of Stress: Not at all  Social Connections: Unknown (09/16/2023)   Social Connection and Isolation Panel    Frequency of Communication with Friends and Family: Once a week    Frequency of Social Gatherings with Friends and Family: Patient declined    Attends Religious Services: Never    Database Administrator or Organizations: No    Attends Banker Meetings: Not on file  Marital Status: Widowed  Depression (PHQ2-9): Medium Risk (01/07/2024)   Depression (PHQ2-9)    PHQ-2 Score: 6  Alcohol Screen: Low Risk (09/16/2023)   Alcohol Screen    Last Alcohol Screening Score (AUDIT): 5  Housing: Low Risk (09/16/2023)   Epic    Unable to Pay for Housing in the Last Year: No    Number of Times Moved in the Last Year: 0    Homeless in the Last Year: No  Utilities: Not At Risk (03/10/2022)   AHC Utilities    Threatened with loss of utilities: No  Health Literacy: Not on file    Allergies: Allergies[1]  Outpatient Meds: Current Outpatient Medications  Medication Sig Dispense Refill   amLODipine  (NORVASC ) 5 MG tablet TAKE 1 TABLET BY MOUTH DAILY 100 tablet 2   atenolol  (TENORMIN ) 50 MG tablet TAKE 1 TABLET BY MOUTH TWICE  DAILY 200 tablet 2   atorvastatin  (LIPITOR) 10 MG tablet TAKE 1 TABLET BY MOUTH DAILY 100 tablet 2   budesonide -formoterol  (SYMBICORT ) 80-4.5 MCG/ACT inhaler Inhale 2 puffs into the lungs in the morning and at bedtime. 1 each 2   Multiple Vitamin (MULTIVITAMIN WITH MINERALS) TABS tablet Take 1 tablet by mouth daily.     Multiple Vitamins-Minerals (PRESERVISION AREDS 2) CAPS Take 1 capsule by mouth 2 (two) times daily.     XARELTO  20 MG TABS tablet TAKE 1 TABLET BY MOUTH DAILY  WITH  SUPPER 100 tablet 2   No current facility-administered medications for this visit.      ___________________________________________________________________ Objective   Exam:  BP 116/64   Pulse 60   Ht 5' 10 (1.778 m)   Wt 299 lb 8 oz (135.9 kg)   SpO2 97%   BMI 42.97 kg/m  Wt Readings from Last 3 Encounters:  03/14/24 299 lb 8 oz (135.9 kg)  03/06/24 202 lb 11.2 oz (91.9 kg)  01/28/24 205 lb 4 oz (93.1 kg)    General: Well-appearing.  Guarded demeanor Eyes: sclera anicteric, no redness ENT: oral mucosa moist without lesions, no cervical or supraclavicular lymphadenopathy CV: Regular without appreciable murmur, no JVD, no peripheral edema Resp: clear to auscultation bilaterally, normal RR and effort noted GI: soft, no tenderness, with active bowel sounds. No guarding or palpable organomegaly noted. Skin; warm and dry, no rash or jaundice noted Neuro: awake, alert and oriented x 3. Normal gross motor function and fluent speech   Labs:     Latest Ref Rng & Units 01/21/2024    9:52 AM 01/19/2024   11:56 AM 01/07/2024    9:08 AM  CBC  WBC 4.0 - 10.5 K/uL 5.8  6.3  5.8   Hemoglobin 13.0 - 17.0 g/dL 89.0  89.9  89.2   Hematocrit 39.0 - 52.0 % 34.3  31.3  33.8   Platelets 150.0 - 400.0 K/uL 205.0  213  181.0        Latest Ref Rng & Units 01/19/2024   11:56 AM 01/07/2024    9:08 AM 09/20/2023   11:54 AM  CMP  Glucose 70 - 99 mg/dL 889  887  892   BUN 8 - 23 mg/dL 15  11  17    Creatinine 0.61 - 1.24 mg/dL 8.88  9.12  9.06   Sodium 135 - 145 mmol/L 133  137  133   Potassium 3.5 - 5.1 mmol/L 4.7  4.3  4.4   Chloride 98 - 111 mmol/L 103  102  102   CO2 22 - 32 mmol/L 26  27  22   Calcium  8.9 - 10.3 mg/dL 9.1  8.8  8.8   Total Protein 6.5 - 8.1 g/dL 6.7  6.5  6.8   Total Bilirubin 0.0 - 1.2 mg/dL 0.6  0.6  0.7   Alkaline Phos 38 - 126 U/L 64  65  64   AST 15 - 41 U/L 17  18  20    ALT 0 - 44 U/L 13  14  15       No iron studies, B12 or folic acid  on  file  Radiologic Studies:  February 2025 echocardiogram  1. Left ventricular ejection fraction, by estimation, is 60 to 65%. The  left ventricle has normal function. The left ventricle has no regional  wall motion abnormalities. There is mild left ventricular hypertrophy.  Left ventricular diastolic parameters  are indeterminate. The average left ventricular global longitudinal strain  is -13.3 %. The global longitudinal strain is abnormal.   2. Right ventricular systolic function is normal. The right ventricular  size is normal.   3. Left atrial size was severely dilated.   4. The mitral valve is normal in structure. No evidence of mitral valve  regurgitation. No evidence of mitral stenosis.   5. The aortic valve is normal in structure. Aortic valve regurgitation is  not visualized. No aortic stenosis is present.   6. The inferior vena cava is normal in size with greater than 50%  respiratory variability, suggesting right atrial pressure of 3 mmHg.    Encounter Diagnoses  Name Primary?   Normocytic anemia Yes   Heme positive stool     Assessment & Plan  Not clear to what extent this normocytic anemia is related to the heme positive stool, but we have to assume that they might be.  He has had an insufficient lab workup for this anemia thus far.  I am grateful to our consultants for evaluating this man prior to undergoing anesthesia for endoscopic procedures, and I appreciate that Martin Bailey was agreeable to having that done. We got him scheduled for an upper endoscopy and colonoscopy with me.  He was agreeable after discussion of procedures and risks.  The benefits and risks of the planned procedure(s) were described in detail with the patient or (when appropriate) their health care proxy.  Risks were outlined as including, but not limited to, bleeding, infection, perforation, adverse medication reaction leading to cardiac or pulmonary decompensation, pancreatitis (if ERCP).  The  limitation of incomplete mucosal visualization was also discussed.  No guarantees or warranties were given. Patient at increased risk for cardiopulmonary complications of procedure due to medical comorbidities.   Plan: CBC, iron studies, B12, folate today  EGD colonoscopy as noted above  Thank you for the courtesy of this consult.  Please call me with any questions or concerns.  Victory LITTIE Brand III  CC: Referring provider noted above     [1]  Allergies Allergen Reactions   Hydrocodone  Other (See Comments)    Lost my appetite, loss of energy

## 2024-03-14 NOTE — Patient Instructions (Signed)
°  _______________________________________________________  If your blood pressure at your visit was 140/90 or greater, please contact your primary care physician to follow up on this.  _______________________________________________________  If you are age 81 or older, your body mass index should be between 23-30. Your Body mass index is 42.97 kg/m. If this is out of the aforementioned range listed, please consider follow up with your Primary Care Provider.  If you are age 46 or younger, your body mass index should be between 19-25. Your Body mass index is 42.97 kg/m. If this is out of the aformentioned range listed, please consider follow up with your Primary Care Provider.   ________________________________________________________  The [facility] providers would like to encourage you to use MYCHART to communicate with providers for non-urgent requests or questions.  Due to long hold times on the telephone, sending your provider a message by [facility] may be a faster and more efficient way to get a response.  Please allow 48 business hours for a response.  Please remember that this is for non-urgent requests.  _______________________________________________________  [facility] is using a team-based approach to care.  Your team is made up of your doctor and two to three APPS. Our APPS (Nurse Practitioners and Physician Assistants) work with your physician to ensure care continuity for you. They are fully qualified to address your health concerns and develop a treatment plan. They communicate directly with your gastroenterologist to care for you. Seeing the Advanced Practice Practitioners on your physician's team can help you by facilitating care more promptly, often allowing for earlier appointments, access to diagnostic testing, procedures, and other specialty referrals.   Your provider has requested that you go to the basement level for lab work before leaving today. Press B on the  elevator. The lab is located at the first door on the left as you exit the elevator.  You have been scheduled for an endoscopy and colonoscopy. Please follow the written instructions given to you at your visit today.  If you use inhalers (even only as needed), please bring them with you on the day of your procedure.  DO NOT TAKE 7 DAYS PRIOR TO TEST- Trulicity (dulaglutide) Ozempic, Wegovy (semaglutide) Mounjaro, Zepbound (tirzepatide) Bydureon Bcise (exanatide extended release)  DO NOT TAKE 1 DAY PRIOR TO YOUR TEST Rybelsus (semaglutide) Adlyxin (lixisenatide) Victoza (liraglutide) Byetta (exanatide) ___________________________________________________________________________  Due to recent changes in healthcare laws, you may see the results of your imaging and laboratory studies on MyChart before your provider has had a chance to review them.  We understand that in some cases there may be results that are confusing or concerning to you. Not all laboratory results come back in the same time frame and the provider may be waiting for multiple results in order to interpret others.  Please give us  48 hours in order for your provider to thoroughly review all the results before contacting the office for clarification of your results.   It was a pleasure to see you today!  Thank you for trusting me with your gastrointestinal care!

## 2024-03-15 ENCOUNTER — Ambulatory Visit: Payer: Self-pay | Admitting: Gastroenterology

## 2024-03-21 ENCOUNTER — Encounter: Payer: Self-pay | Admitting: Family Medicine

## 2024-03-21 ENCOUNTER — Ambulatory Visit: Admitting: Family Medicine

## 2024-03-21 VITALS — BP 128/80 | HR 79 | Temp 97.9°F | Ht 70.0 in | Wt 203.2 lb

## 2024-03-21 DIAGNOSIS — I4821 Permanent atrial fibrillation: Secondary | ICD-10-CM

## 2024-03-21 DIAGNOSIS — I10 Essential (primary) hypertension: Secondary | ICD-10-CM | POA: Diagnosis not present

## 2024-03-21 DIAGNOSIS — E785 Hyperlipidemia, unspecified: Secondary | ICD-10-CM | POA: Diagnosis not present

## 2024-03-21 NOTE — Progress Notes (Signed)
 " Phone [phone] In person visit   Subjective:   Martin Bailey is a 83 y.o. year old very pleasant male patient who presents for/with See problem oriented charting Chief Complaint  Patient presents with   Medical Management of Chronic Issues    6 month follow up;     Past Medical History-  Patient Active Problem List   Diagnosis Date Noted   Elevated coronary artery calcium  score 05/02/2020    Priority: High   Stage III squamous cell carcinoma of right lung (HCC) 01/06/2019    Priority: High   Alcoholism (HCC) 06/21/2014    Priority: High   Atrial fibrillation (HCC) 03/04/2007    Priority: High   Elevated TSH 09/21/2014    Priority: Medium    Seborrheic dermatitis 07/19/2007    Priority: Medium    Hyperlipidemia 03/04/2007    Priority: Medium    Major depression (HCC) 03/04/2007    Priority: Medium    Essential hypertension 03/04/2007    Priority: Medium    Former smoker 06/21/2014    Priority: Low   Allergic rhinitis 04/06/2014    Priority: Low   Hx of adenomatous colonic polyps 03/05/2014    Priority: Low   Chronic anticoagulation 03/05/2014    Priority: Low   Testosterone  deficiency 09/02/2010    Priority: Low   Recurrent major depressive disorder, in full remission 09/15/2022   Abnormal findings on diagnostic imaging of lung 12/15/2019   Drug-induced pneumonitis 09/19/2019   Encounter for antineoplastic immunotherapy 03/29/2019   Goals of care, counseling/discussion 01/06/2019   Overweight 08/09/2017    Medications- reviewed and updated Current Outpatient Medications  Medication Sig Dispense Refill   amLODipine  (NORVASC ) 5 MG tablet TAKE 1 TABLET BY MOUTH DAILY 100 tablet 2   atenolol  (TENORMIN ) 50 MG tablet TAKE 1 TABLET BY MOUTH TWICE  DAILY 200 tablet 2   atorvastatin  (LIPITOR) 10 MG tablet TAKE 1 TABLET BY MOUTH DAILY 100 tablet 2   budesonide -formoterol  (SYMBICORT ) 80-4.5 MCG/ACT inhaler Inhale 2 puffs into the lungs in the morning and at  bedtime. 1 each 2   Multiple Vitamin (MULTIVITAMIN WITH MINERALS) TABS tablet Take 1 tablet by mouth daily.     Multiple Vitamins-Minerals (PRESERVISION AREDS 2) CAPS Take 1 capsule by mouth 2 (two) times daily.     XARELTO  20 MG TABS tablet TAKE 1 TABLET BY MOUTH DAILY  WITH SUPPER 100 tablet 2   No current facility-administered medications for this visit.     Objective:  BP 128/80 (BP Location: Left Arm, Patient Position: Sitting, Cuff Size: Normal)   Pulse 79   Temp 97.9 F (36.6 C) (Temporal)   Ht 5' 10 (1.778 m)   Wt 203 lb 3.2 oz (92.2 kg)   SpO2 99%   BMI 29.16 kg/m  Gen: NAD, resting comfortably CV: RRR no murmurs rubs or gallops Lungs: CTAB no crackles, wheeze, rhonchi Ext: trace edema Skin: warm, dry    Assessment and Plan   # Fatigue/Anemia-worsened at last visit and recommended repeat is down from 12.8 to 10.7.  We were concerned this could be causing his fatigue.  Plan is for endoscopy and colonoscopy although they are not sure that his anemia is related to GI tract.-This is planned in January -he started iron a month ago and has seen gradual improvement. Most recently hemoglobin back up to 12.2 with his iron. Ferritin still low normal but suspected improving.  - Symbicort  has been helpful for fatigue as well- he's noting improvement  #  New pulmonary nodule 7 mm left lower lobe October 2025 with plan 94-month follow-up as well as some shifting right-sided subsolid nodules-possible infectious versus inflammatory  # Recent ABIs-mild decreased blood flow to toe but ankle-brachial index normal range bilaterally -leg heaviness still noted in calves mainly - after the exertion does improve within 15-20 seconds   # history of stage III squamous cell carcinoma of the lungs S:  diagnosed October 2020-status post course of concurrent chemoradiation with 5 cycles of chemotherapy.  Did develop pancytopenia.  - with chemoradiation- Patient had partial response with initial  treatment and underwent consolidation treatment with immunotherapy Imfinzi   x 5 cycles but developed pneumonitis and diffuse itching and ultimately stopped - also dealing with pneumonitis possibly related to the imfinzi -patient on Symbicort  and weaned off- unc was very helpful- did later restart an that was helpful on the symbicort   -in 2025 monitorign new lesion that may be postinflammatory or postinfections   with plan for 6 month repeat CT  A/P: overall doing well- does have some small areas being monitored at 3 month interval this time  #Persistent atrial fibrillation S: Rate controlled with atenolol  50 mg twice daily  Anticoagulated with Xarelto  20mg  A/P: appropriately anticoagulated and rate controlled- continue current medicine   #hypertension S: medication:  atenolol  50 mg twice daily , amlodipine  5 mg- actually taking full tablet 3x a week- drops dose if too low -home blood pressure has been stable BP Readings from Last 3 Encounters:  03/21/24 128/80  03/14/24 116/64  03/06/24 124/76  A/P:  well controlled continue current medications - amlodipine  regimen atypical but owrking for him  #CAD based on ct cardiac scoring 298 in 2021 #hyperlipidemia S: Medication:atorvastatin  10 mg daily Lab Results  Component Value Date   CHOL 124 09/20/2023   HDL 47.40 09/20/2023   LDLCALC 66 09/20/2023   LDLDIRECT 62 09/09/2020   TRIG 55.0 09/20/2023   CHOLHDL 3 09/20/2023   A/P: LDL under 70 in June- continue current medications   #prediabetes check next visit   Recommended follow up: Return in about 6 months (around 09/19/2024) for physical or sooner if needed.Schedule b4 you leave. Future Appointments  Date Time Provider Department Center  04/17/2024  2:00 PM Legrand Victory LITTIE DOUGLAS, MD [facility]  04/18/2024 10:00 AM [facility] None  04/27/2024 10:00 AM Sherrod Sherrod, MD [facility] None  05/18/2024  9:15 AM Kassie Acquanetta Bradley, MD [id number] [phone] [facility]     Lab/Order associations:   ICD-10-CM   1. Permanent atrial fibrillation (HCC)  I48.21     2. Essential hypertension  I10     3. Hyperlipidemia, unspecified hyperlipidemia type  E78.5       No orders of the defined types were placed in this encounter.   Return precautions advised.  Garnette Lukes, MD  "

## 2024-03-21 NOTE — Patient Instructions (Addendum)
 Hold off on labs as recently done. Glad fatigue is doing better. Continue current medications   Recommended follow up: Return in about 6 months (around 09/19/2024) for physical or sooner if needed.Schedule b4 you leave.

## 2024-04-10 ENCOUNTER — Encounter: Payer: Self-pay | Admitting: Gastroenterology

## 2024-04-17 ENCOUNTER — Other Ambulatory Visit: Payer: Self-pay | Admitting: Gastroenterology

## 2024-04-17 ENCOUNTER — Ambulatory Visit: Admitting: Gastroenterology

## 2024-04-17 ENCOUNTER — Encounter: Payer: Self-pay | Admitting: Gastroenterology

## 2024-04-17 VITALS — BP 92/71 | HR 82 | Temp 97.3°F | Resp 14 | Ht 70.0 in | Wt 202.0 lb

## 2024-04-17 DIAGNOSIS — K31A11 Gastric intestinal metaplasia without dysplasia, involving the antrum: Secondary | ICD-10-CM

## 2024-04-17 DIAGNOSIS — K2990 Gastroduodenitis, unspecified, without bleeding: Secondary | ICD-10-CM

## 2024-04-17 DIAGNOSIS — D649 Anemia, unspecified: Secondary | ICD-10-CM

## 2024-04-17 DIAGNOSIS — C184 Malignant neoplasm of transverse colon: Secondary | ICD-10-CM | POA: Diagnosis not present

## 2024-04-17 DIAGNOSIS — K648 Other hemorrhoids: Secondary | ICD-10-CM

## 2024-04-17 DIAGNOSIS — R195 Other fecal abnormalities: Secondary | ICD-10-CM | POA: Diagnosis not present

## 2024-04-17 DIAGNOSIS — L818 Other specified disorders of pigmentation: Secondary | ICD-10-CM

## 2024-04-17 DIAGNOSIS — K2951 Unspecified chronic gastritis with bleeding: Secondary | ICD-10-CM

## 2024-04-17 DIAGNOSIS — K2931 Chronic superficial gastritis with bleeding: Secondary | ICD-10-CM

## 2024-04-17 MED ORDER — SODIUM CHLORIDE 0.9 % IV SOLN: 500.0000 mL | Freq: Once | INTRAVENOUS | Status: DC

## 2024-04-17 NOTE — Progress Notes (Signed)
 Called to room to assist during endoscopic procedure.  Patient ID and intended procedure confirmed with present staff. Received instructions for my participation in the procedure from the performing physician.

## 2024-04-17 NOTE — Progress Notes (Signed)
Pathology sent rush per MD order

## 2024-04-17 NOTE — Patient Instructions (Addendum)
 " - Resume previous diet.  - Resume Xarelto  (rivaroxaban ) at prior dose      tomorrow. - Await pathology results. - See the other procedure note for documentation of      additional recommendations. - Use OTC Prilosec (omeprazole ) 20 mg PO daily.   YOU HAD AN ENDOSCOPIC PROCEDURE TODAY AT THE [facility]:   Refer to the procedure report that was given to you for any specific questions about what was found during the examination.  If the procedure report does not answer your questions, please call your gastroenterologist to clarify.  If you requested that your care partner not be given the details of your procedure findings, then the procedure report has been included in a sealed envelope for you to review at your convenience later.  YOU SHOULD EXPECT: Some feelings of bloating in the abdomen. Passage of more gas than usual.  Walking can help get rid of the air that was put into your GI tract during the procedure and reduce the bloating. If you had a lower endoscopy (such as a colonoscopy or flexible sigmoidoscopy) you may notice spotting of blood in your stool or on the toilet paper. If you underwent a bowel prep for your procedure, you may not have a normal bowel movement for a few days.  Please Note:  You might notice some irritation and congestion in your nose or some drainage.  This is from the oxygen used during your procedure.  There is no need for concern and it should clear up in a day or so.  SYMPTOMS TO REPORT IMMEDIATELY:  Following lower endoscopy (colonoscopy or flexible sigmoidoscopy):  Excessive amounts of blood in the stool  Significant tenderness or worsening of abdominal pains  Swelling of the abdomen that is new, acute  Fever of 100F or higher  Following upper endoscopy (EGD)  Vomiting of blood or coffee ground material  New chest pain or pain under the shoulder blades  Painful or persistently difficult swallowing  New shortness of breath  Fever of 100F or  higher  Black, tarry-looking stools  For urgent or emergent issues, a gastroenterologist can be reached at any hour by calling [phone]. Do not use MyChart messaging for urgent concerns.    DIET:  We do recommend a small meal at first, but then you may proceed to your regular diet.  Drink plenty of fluids but you should avoid alcoholic beverages for 24 hours.  ACTIVITY:  You should plan to take it easy for the rest of today and you should NOT DRIVE or use heavy machinery until tomorrow (because of the sedation medicines used during the test).    FOLLOW UP: Our staff will call the number listed on your records the next business day following your procedure.  We will call around 7:15- 8:00 am to check on you and address any questions or concerns that you may have regarding the information given to you following your procedure. If we do not reach you, we will leave a message.     If any biopsies were taken you will be contacted by phone or by letter within the next 1-3 weeks.  Please call us  at [phone] if you have not heard about the biopsies in 3 weeks.    SIGNATURES/CONFIDENTIALITY: You and/or your care partner have signed paperwork which will be entered into your electronic medical record.  These signatures attest to the fact that that the information above on your After Visit Summary has been reviewed  and is understood.  Full responsibility of the confidentiality of this discharge information lies with you and/or your care-partner. "

## 2024-04-17 NOTE — Progress Notes (Signed)
 History and Physical:  This patient presents for endoscopic testing for: Encounter Diagnoses  Name Primary?   Normocytic anemia Yes   Heme positive stool    84 year old man here today for endoscopic evaluation of heme positive stool and chronic normocytic anemia.  Clinical details of this are outlined in my 03/14/2024 office note.  No significant clinical changes since then.  Patient is otherwise without complaints or active issues today.   Past Medical History: Past Medical History:  Diagnosis Date   Arthritis    Cataract    left   COLONIC POLYPS, HX OF 04/05/2007   ONE ADENOMATOUS POLYP AND ONE HYPERPLASTIC POLYP   DEPRESSION 03/04/2007   DERMATITIS, SEBORRHEIC 07/19/2007   Dysrhythmia    FIBRILLATION, ATRIAL 03/04/2007   Hearing aid worn    B/L   History of kidney stones    HYPERLIPIDEMIA 03/04/2007   HYPERTENSION 03/04/2007   Lung cancer (HCC)    non small cell lung ca   Lung mass    mediastinal adenopathy     Past Surgical History: Past Surgical History:  Procedure Laterality Date   BIOPSY  01/09/2020   Procedure: BIOPSY;  Surgeon: Neda Jennet LABOR, MD;  Location: [facility];  Service: Endoscopy;;   BRONCHIAL WASHINGS  01/09/2020   Procedure: BRONCHIAL WASHINGS;  Surgeon: Neda Jennet LABOR, MD;  Location: [facility];  Service: Endoscopy;;   CATARACT EXTRACTION W/ INTRAOCULAR LENS IMPLANT     right   COLONOSCOPY     IR IMAGING GUIDED PORT INSERTION  01/31/2019   JOINT REPLACEMENT  06/23/2023   PARTIAL KNEE ARTHROPLASTY Right 06/23/2023   Procedure: UNICOMPARTMENTAL KNEE-MEDIAL;  Surgeon: [name] Toribio LABOR, MD;  Location: [facility];  Service: Orthopedics;  Laterality: Right;   RADIOFREQUENCY ABLATION     x2 for fib, on 2nd attempt switched form a flutter ot a fib.    ROTATOR CUFF REPAIR Right    TONSILLECTOMY     VIDEO BRONCHOSCOPY N/A 01/09/2020   Procedure: VIDEO BRONCHOSCOPY WITH FLUORO;  Surgeon: Neda Jennet LABOR, MD;  Location: [facility];   Service: Endoscopy;  Laterality: N/A;   VIDEO BRONCHOSCOPY WITH ENDOBRONCHIAL ULTRASOUND Left 12/30/2018   Procedure: VIDEO BRONCHOSCOPY WITH ENDOBRONCHIAL ULTRASOUND WITH FLUOROSCOPY;  Surgeon: Neda Jennet LABOR, MD;  Location: [facility];  Service: Thoracic;  Laterality: Left;   WISDOM TOOTH EXTRACTION      Allergies: Allergies[1]  Outpatient Meds: Current Outpatient Medications  Medication Sig Dispense Refill   amLODipine  (NORVASC ) 5 MG tablet TAKE 1 TABLET BY MOUTH DAILY 100 tablet 2   atenolol  (TENORMIN ) 50 MG tablet TAKE 1 TABLET BY MOUTH TWICE  DAILY 200 tablet 2   atorvastatin  (LIPITOR) 10 MG tablet TAKE 1 TABLET BY MOUTH DAILY 100 tablet 2   budesonide -formoterol  (SYMBICORT ) 80-4.5 MCG/ACT inhaler Inhale 2 puffs into the lungs in the morning and at bedtime. 1 each 2   Multiple Vitamin (MULTIVITAMIN WITH MINERALS) TABS tablet Take 1 tablet by mouth daily.     Multiple Vitamins-Minerals (PRESERVISION AREDS 2) CAPS Take 1 capsule by mouth 2 (two) times daily.     XARELTO  20 MG TABS tablet TAKE 1 TABLET BY MOUTH DAILY  WITH SUPPER 100 tablet 2   Current Facility-Administered Medications  Medication Dose Route Frequency Provider Last Rate Last Admin   0.9 %  sodium chloride  infusion  500 mL Intravenous Once Danis, Vanellope Passmore L III, MD          ___________________________________________________________________ Objective   Exam:  BP (!) 142/82   Pulse 83  Temp (!) 97.3 F (36.3 C)   Ht 5' 10 (1.778 m)   Wt 202 lb (91.6 kg)   SpO2 98%   BMI 28.98 kg/m   CV: regular , S1/S2 Resp: clear to auscultation bilaterally, normal RR and effort noted GI: soft, no tenderness, with active bowel sounds.   Assessment: Encounter Diagnoses  Name Primary?   Normocytic anemia Yes   Heme positive stool      Plan: Colonoscopy EGD  The benefits and risks of the planned procedure(s) were described in detail with the patient or (when appropriate) their health care proxy.  Risks were  outlined as including, but not limited to, bleeding, infection, perforation, adverse medication reaction leading to cardiac or pulmonary decompensation, pancreatitis (if ERCP).  The limitation of incomplete mucosal visualization was also discussed.  No guarantees or warranties were given.  The patient was provided an opportunity to ask questions and all were answered. The patient agreed with the plan.   The patient is appropriate for an endoscopic procedure in the ambulatory setting.   - Victory Brand, MD        [1]  Allergies Allergen Reactions   Hydrocodone  Other (See Comments)    Lost my appetite, loss of energy

## 2024-04-17 NOTE — Op Note (Signed)
 [facility] Patient Name: Martin Bailey Procedure Date: 04/17/2024 2:23 PM MRN: [id number] Endoscopist: Victory L. Legrand , MD, [id number] Age: 84 Referring MD:  Date of Birth: 02-27-1941 Gender: Male Account #: [id number] Procedure:                Upper GI endoscopy Indications:              Unexplained (normocytic) iron deficiency anemia,                            Heme positive stool Medicines:                Monitored Anesthesia Care Procedure:                Pre-Anesthesia Assessment:                           - Prior to the procedure, a History and Physical                            was performed, and patient medications and                            allergies were reviewed. The patient's tolerance of                            previous anesthesia was also reviewed. The risks                            and benefits of the procedure and the sedation                            options and risks were discussed with the patient.                            All questions were answered, and informed consent                            was obtained. Prior Anticoagulants: The patient has                            taken Xarelto  (rivaroxaban ), last dose was 2 days                            prior to procedure. ASA Grade Assessment: III - A                            patient with severe systemic disease. After                            reviewing the risks and benefits, the patient was                            deemed in satisfactory condition to undergo the  procedure.                           After obtaining informed consent, the endoscope was                            passed under direct vision. Throughout the                            procedure, the patient's blood pressure, pulse, and                            oxygen saturations were monitored continuously. The                            Olympus Scope SN [id number] was introduced through the                             mouth, and advanced to the second part of duodenum.                            The upper GI endoscopy was accomplished without                            difficulty. The patient tolerated the procedure                            well. Scope In: Scope Out: Findings:                 The esophagus was normal.                           Diffuse inflammation characterized by adherent                            blood, congestion (edema), erythema and granularity                            was found in the entire examined stomach. Several                            biopsies were obtained in the gastric body and in                            the gastric antrum with cold forceps for histology.                           The cardia and gastric fundus were normal on                            retroflexion.                           The examined duodenum was normal. Complications:  No immediate complications. Estimated Blood Loss:     Estimated blood loss was minimal. Impression:               - Normal esophagus.                           - Gastritis.                           - Normal examined duodenum.                           - Several biopsies were obtained in the gastric                            body and in the gastric antrum. Recommendation:           - Patient has a contact number available for                            emergencies. The signs and symptoms of potential                            delayed complications were discussed with the                            patient. Return to normal activities tomorrow.                            Written discharge instructions were provided to the                            patient.                           - Resume previous diet.                           - Resume Xarelto  (rivaroxaban ) at prior dose                            tomorrow.                           - Await pathology results.                           -  See the other procedure note for documentation of                            additional recommendations.                           - Use OTC Prilosec (omeprazole ) 20 mg PO daily. Jamelah Sitzer L. Legrand, MD 04/17/2024 3:25:38 PM This report has been signed electronically.

## 2024-04-17 NOTE — Op Note (Signed)
 [facility] Patient Name: Martin Bailey Procedure Date: 04/17/2024 2:23 PM MRN: [id number] Endoscopist: Victory L. Legrand , MD, [id number] Age: 84 Referring MD:  Date of Birth: Feb 23, 1941 Gender: Male Account #: [id number] Procedure:                Colonoscopy Indications:              Heme positive stool, Unexplained normocytic iron                            deficiency anemia                           Advance transverse colon polyp discovered April                            2019 at this practice                           Colonoscopy with EMR at [facility] May 2019                           Subsequent surveillance colonoscopies at [facility]                            December 2021 and January 2022 Medicines:                Monitored Anesthesia Care Procedure:                Pre-Anesthesia Assessment:                           - Prior to the procedure, a History and Physical                            was performed, and patient medications and                            allergies were reviewed. The patient's tolerance of                            previous anesthesia was also reviewed. The risks                            and benefits of the procedure and the sedation                            options and risks were discussed with the patient.                            All questions were answered, and informed consent                            was obtained. Prior Anticoagulants: The patient has                            taken Xarelto  (  rivaroxaban ), last dose was 2 days                            prior to procedure. ASA Grade Assessment: III - A                            patient with severe systemic disease. After                            reviewing the risks and benefits, the patient was                            deemed in satisfactory condition to undergo the                            procedure.                           After obtaining informed consent, the colonoscope                             was passed under direct vision. Throughout the                            procedure, the patient's blood pressure, pulse, and                            oxygen saturations were monitored continuously. The                            CF [id number] #[id number] was introduced through the anus                            and advanced to the the cecum, identified by                            appendiceal orifice and ileocecal valve. The                            colonoscopy was performed without difficulty. The                            patient tolerated the procedure well. The quality                            of the bowel preparation was good. The ileocecal                            valve, appendiceal orifice, and rectum were                            photographed. Scope In: 2:48:58 PM Scope Out: 3:03:22 PM Scope Withdrawal Time: 0 hours 11 minutes 21 seconds  Total Procedure Duration: 0 hours 14 minutes 24 seconds  Findings:                 The perianal and digital rectal examinations were                            normal.                           A tattoo was seen in the transverse colon.                           A non-obstructing mass was found in the mid                            transverse colon just proximal to the tattoo on the                            opposite wall. The mass measured approximately                            three cm in length and was perhaps one quarter of                            the circumference of the colon. Ulcerated with                            heaped edges and friable tissue. Biopsies were                            taken with a cold forceps for histology. (Sent rush)                           Internal hemorrhoids were found. The hemorrhoids                            were small.                           The exam was otherwise without abnormality on                            direct and retroflexion views. Complications:            No  immediate complications. Estimated Blood Loss:     Estimated blood loss was minimal. Impression:               - A tattoo was seen in the transverse colon.                           - Possibly malignant mass in the transverse colon.                            Biopsied.                           - Internal hemorrhoids.                           -  The examination was otherwise normal on direct                            and retroflexion views. Recommendation:           - Patient has a contact number available for                            emergencies. The signs and symptoms of potential                            delayed complications were discussed with the                            patient. Return to normal activities tomorrow.                            Written discharge instructions were provided to the                            patient.                           - Resume previous diet.                           - Continue present medications.                           - Await pathology results.                           - See the other procedure note for documentation of                            additional recommendations. Luay Balding L. Legrand, MD 04/17/2024 3:20:04 PM This report has been signed electronically.

## 2024-04-17 NOTE — Progress Notes (Signed)
 To pacu, VSS. Report to Rn.tb

## 2024-04-18 ENCOUNTER — Telehealth: Payer: Self-pay

## 2024-04-18 ENCOUNTER — Telehealth: Payer: Self-pay | Admitting: Gastroenterology

## 2024-04-18 ENCOUNTER — Inpatient Hospital Stay

## 2024-04-18 DIAGNOSIS — C189 Malignant neoplasm of colon, unspecified: Secondary | ICD-10-CM

## 2024-04-18 HISTORY — DX: Malignant neoplasm of colon, unspecified: C18.9

## 2024-04-18 NOTE — Telephone Encounter (Signed)
 Dr. Legrand- thank you so much for letting me know! Was able to chat with him today as well- means a lot to me and to him that you kept me in the loop

## 2024-04-18 NOTE — Telephone Encounter (Signed)
 GI nursing,  I spoke with Martin Bailey this afternoon after receiving a call from the pathologist confirming that his transverse colon mass is cancerous. (Moderately differentiated adenocarcinoma)  He understands the plan for CT scan chest/abdomen/pelvis with oral and IV contrast as well as a CEA blood test for staging. He is also agreeable to a consultation with surgery, though he remains apprehensive about surgical resection and any possibility that it could result in a colostomy bag.  Told him colostomy unlikely but always possible.  In addition to arranging the above scans and lab, please send a referral to [facility] for transverse colon cancer.  (ASAP)  Thank you.  - HD ____________________________  Dr. Sherrod,    Patient known to you with lung cancer on treatment.  FYI new Dx non-obstructing transverse colon adenoCA causing IDA without other symptoms.  He is seeing you soon and hopes to discuss with you further about treatment options as well as prognosis if elects not to do so.  Martin Bailey, [facility]  _________________  Dr Katrinka,    Martin Bailey on your patient.

## 2024-04-18 NOTE — Telephone Encounter (Signed)
" °  Follow up Call-     04/17/2024    2:16 PM  Call back number  Post procedure Call Back phone  # [phone]  Permission to leave phone message Yes     Patient questions:  Do you have a fever, pain , or abdominal swelling? No. Pain Score  0 *  Have you tolerated food without any problems? Yes.    Have you been able to return to your normal activities? Yes.    Do you have any questions about your discharge instructions: Diet   No. Medications  No. Follow up visit  No.  Do you have questions or concerns about your Care? No.  Actions: * If pain score is 4 or above: No action needed, pain <4.   "

## 2024-04-19 ENCOUNTER — Other Ambulatory Visit: Payer: Self-pay

## 2024-04-19 ENCOUNTER — Ambulatory Visit: Payer: Self-pay | Admitting: Gastroenterology

## 2024-04-19 ENCOUNTER — Encounter (HOSPITAL_COMMUNITY): Payer: Self-pay | Admitting: Internal Medicine

## 2024-04-19 ENCOUNTER — Telehealth: Payer: Self-pay

## 2024-04-19 ENCOUNTER — Encounter (HOSPITAL_COMMUNITY): Payer: Self-pay

## 2024-04-19 ENCOUNTER — Ambulatory Visit (HOSPITAL_COMMUNITY)
Admission: RE | Admit: 2024-04-19 | Discharge: 2024-04-19 | Disposition: A | Source: Ambulatory Visit | Attending: Internal Medicine | Admitting: Internal Medicine

## 2024-04-19 ENCOUNTER — Observation Stay (HOSPITAL_BASED_OUTPATIENT_CLINIC_OR_DEPARTMENT_OTHER)
Admission: EM | Admit: 2024-04-19 | Discharge: 2024-04-20 | Disposition: A | Discharge status: Home / Self Care | Source: Ambulatory Visit | Attending: Emergency Medicine | Admitting: Emergency Medicine

## 2024-04-19 ENCOUNTER — Inpatient Hospital Stay: Attending: Internal Medicine

## 2024-04-19 DIAGNOSIS — I482 Chronic atrial fibrillation, unspecified: Secondary | ICD-10-CM | POA: Diagnosis not present

## 2024-04-19 DIAGNOSIS — Z9221 Personal history of antineoplastic chemotherapy: Secondary | ICD-10-CM | POA: Insufficient documentation

## 2024-04-19 DIAGNOSIS — I700 Atherosclerosis of aorta: Secondary | ICD-10-CM | POA: Insufficient documentation

## 2024-04-19 DIAGNOSIS — Z923 Personal history of irradiation: Secondary | ICD-10-CM | POA: Insufficient documentation

## 2024-04-19 DIAGNOSIS — C3491 Malignant neoplasm of unspecified part of right bronchus or lung: Secondary | ICD-10-CM | POA: Diagnosis present

## 2024-04-19 DIAGNOSIS — I2699 Other pulmonary embolism without acute cor pulmonale: Principal | ICD-10-CM | POA: Insufficient documentation

## 2024-04-19 DIAGNOSIS — C3490 Malignant neoplasm of unspecified part of unspecified bronchus or lung: Secondary | ICD-10-CM

## 2024-04-19 DIAGNOSIS — Z85038 Personal history of other malignant neoplasm of large intestine: Secondary | ICD-10-CM | POA: Insufficient documentation

## 2024-04-19 DIAGNOSIS — I4891 Unspecified atrial fibrillation: Secondary | ICD-10-CM | POA: Insufficient documentation

## 2024-04-19 DIAGNOSIS — Z85118 Personal history of other malignant neoplasm of bronchus and lung: Secondary | ICD-10-CM | POA: Insufficient documentation

## 2024-04-19 DIAGNOSIS — N329 Bladder disorder, unspecified: Secondary | ICD-10-CM | POA: Insufficient documentation

## 2024-04-19 DIAGNOSIS — R911 Solitary pulmonary nodule: Secondary | ICD-10-CM

## 2024-04-19 DIAGNOSIS — Z7901 Long term (current) use of anticoagulants: Secondary | ICD-10-CM | POA: Insufficient documentation

## 2024-04-19 DIAGNOSIS — D61818 Other pancytopenia: Secondary | ICD-10-CM | POA: Insufficient documentation

## 2024-04-19 DIAGNOSIS — Z87891 Personal history of nicotine dependence: Secondary | ICD-10-CM | POA: Insufficient documentation

## 2024-04-19 DIAGNOSIS — Z79899 Other long term (current) drug therapy: Secondary | ICD-10-CM | POA: Insufficient documentation

## 2024-04-19 DIAGNOSIS — C184 Malignant neoplasm of transverse colon: Secondary | ICD-10-CM | POA: Insufficient documentation

## 2024-04-19 DIAGNOSIS — I10 Essential (primary) hypertension: Secondary | ICD-10-CM | POA: Insufficient documentation

## 2024-04-19 DIAGNOSIS — E785 Hyperlipidemia, unspecified: Secondary | ICD-10-CM | POA: Insufficient documentation

## 2024-04-19 DIAGNOSIS — I2609 Other pulmonary embolism with acute cor pulmonale: Secondary | ICD-10-CM

## 2024-04-19 DIAGNOSIS — C3431 Malignant neoplasm of lower lobe, right bronchus or lung: Secondary | ICD-10-CM | POA: Insufficient documentation

## 2024-04-19 LAB — CMP (CANCER CENTER ONLY)
ALT: 17 U/L (ref 0–44)
AST: 24 U/L (ref 15–41)
Albumin: 4.3 g/dL (ref 3.5–5.0)
Alkaline Phosphatase: 80 U/L (ref 38–126)
Anion gap: 12 (ref 5–15)
BUN: 10 mg/dL (ref 8–23)
CO2: 26 mmol/L (ref 22–32)
Calcium: 9.1 mg/dL (ref 8.9–10.3)
Chloride: 101 mmol/L (ref 98–111)
Creatinine: 0.99 mg/dL (ref 0.61–1.24)
GFR, Estimated: >60 mL/min
Glucose, Bld: 65 mg/dL — ABNORMAL LOW (ref 70–99)
Potassium: 4.4 mmol/L (ref 3.5–5.1)
Sodium: 138 mmol/L (ref 135–145)
Total Bilirubin: 0.5 mg/dL (ref 0.0–1.2)
Total Protein: 7.4 g/dL (ref 6.5–8.1)

## 2024-04-19 LAB — BASIC METABOLIC PANEL WITH GFR
Anion gap: 10 (ref 5–15)
BUN: 9 mg/dL (ref 8–23)
CO2: 22 mmol/L (ref 22–32)
Calcium: 8.9 mg/dL (ref 8.9–10.3)
Chloride: 102 mmol/L (ref 98–111)
Creatinine, Ser: 0.83 mg/dL (ref 0.61–1.24)
GFR, Estimated: >60 mL/min
Glucose, Bld: 100 mg/dL — ABNORMAL HIGH (ref 70–99)
Potassium: 4.3 mmol/L (ref 3.5–5.1)
Sodium: 134 mmol/L — ABNORMAL LOW (ref 135–145)

## 2024-04-19 LAB — HEPARIN LEVEL (UNFRACTIONATED): Heparin Unfractionated: >1.1 [IU]/mL — ABNORMAL HIGH (ref 0.30–0.70)

## 2024-04-19 LAB — CBC WITH DIFFERENTIAL (CANCER CENTER ONLY)
Abs Immature Granulocytes: 0.03 K/uL (ref 0.00–0.07)
Basophils Absolute: 0.1 K/uL (ref 0.0–0.1)
Basophils Relative: 1 %
Eosinophils Absolute: 0.2 K/uL (ref 0.0–0.5)
Eosinophils Relative: 2 %
HCT: 43.3 % (ref 39.0–52.0)
Hemoglobin: 14.5 g/dL (ref 13.0–17.0)
Immature Granulocytes: 0 %
Lymphocytes Relative: 21 %
Lymphs Abs: 1.9 K/uL (ref 0.7–4.0)
MCH: 31.9 pg (ref 26.0–34.0)
MCHC: 33.5 g/dL (ref 30.0–36.0)
MCV: 95.2 fL (ref 80.0–100.0)
Monocytes Absolute: 1 K/uL (ref 0.1–1.0)
Monocytes Relative: 11 %
Neutro Abs: 6.1 K/uL (ref 1.7–7.7)
Neutrophils Relative %: 65 %
Platelet Count: 162 K/uL (ref 150–400)
RBC: 4.55 MIL/uL (ref 4.22–5.81)
RDW: 16.3 % — ABNORMAL HIGH (ref 11.5–15.5)
WBC Count: 9.2 K/uL (ref 4.0–10.5)
nRBC: 0 % (ref 0.0–0.2)

## 2024-04-19 LAB — TROPONIN T, HIGH SENSITIVITY
Troponin T High Sensitivity: 14 ng/L (ref 0–19)
Troponin T High Sensitivity: 15 ng/L (ref 0–19)

## 2024-04-19 LAB — CBC
HCT: 41.9 % (ref 39.0–52.0)
Hemoglobin: 14.2 g/dL (ref 13.0–17.0)
MCH: 32.6 pg (ref 26.0–34.0)
MCHC: 33.9 g/dL (ref 30.0–36.0)
MCV: 96.3 fL (ref 80.0–100.0)
Platelets: 146 K/uL — ABNORMAL LOW (ref 150–400)
RBC: 4.35 MIL/uL (ref 4.22–5.81)
RDW: 16.1 % — ABNORMAL HIGH (ref 11.5–15.5)
WBC: 9.8 K/uL (ref 4.0–10.5)
nRBC: 0 % (ref 0.0–0.2)

## 2024-04-19 LAB — APTT: aPTT: 68 s — ABNORMAL HIGH (ref 24–36)

## 2024-04-19 LAB — SURGICAL PATHOLOGY

## 2024-04-19 MED ORDER — ATENOLOL 50 MG PO TABS
50.0000 mg | ORAL_TABLET | Freq: Two times a day (BID) | ORAL | Status: DC
  Administered 2024-04-19 – 2024-04-20 (×2): 50 mg via ORAL
  Filled 2024-04-19 (×2): qty 1

## 2024-04-19 MED ORDER — HEPARIN SOD (PORK) LOCK FLUSH 100 UNIT/ML IV SOLN
500.0000 [IU] | Freq: Once | INTRAVENOUS | Status: AC
  Administered 2024-04-19: 500 [IU] via INTRAVENOUS

## 2024-04-19 MED ORDER — HEPARIN (PORCINE) 25000 UT/250ML-% IV SOLN
1500.0000 [IU]/h | INTRAVENOUS | Status: DC
  Administered 2024-04-19: 1500 [IU]/h via INTRAVENOUS
  Filled 2024-04-19: qty 250

## 2024-04-19 MED ORDER — PROSIGHT PO TABS
1.0000 | ORAL_TABLET | Freq: Two times a day (BID) | ORAL | Status: DC
  Administered 2024-04-19 – 2024-04-20 (×2): 1 via ORAL
  Filled 2024-04-19 (×2): qty 1

## 2024-04-19 MED ORDER — ACETAMINOPHEN 325 MG PO TABS: 650.0000 mg | ORAL_TABLET | Freq: Four times a day (QID) | ORAL | Status: DC | PRN

## 2024-04-19 MED ORDER — IOHEXOL 300 MG/ML  SOLN
100.0000 mL | Freq: Once | INTRAMUSCULAR | Status: AC | PRN
  Administered 2024-04-19: 100 mL via INTRAVENOUS

## 2024-04-19 MED ORDER — ATORVASTATIN CALCIUM 10 MG PO TABS
10.0000 mg | ORAL_TABLET | Freq: Every day | ORAL | Status: DC
  Administered 2024-04-20: 10 mg via ORAL
  Filled 2024-04-19: qty 1

## 2024-04-19 MED ORDER — ACETAMINOPHEN 650 MG RE SUPP: 650.0000 mg | Freq: Four times a day (QID) | RECTAL | Status: DC | PRN

## 2024-04-19 MED ORDER — IOHEXOL 9 MG/ML PO SOLN: 1000.0000 mL | ORAL | Status: AC

## 2024-04-19 MED ORDER — HEPARIN SOD (PORK) LOCK FLUSH 100 UNIT/ML IV SOLN
INTRAVENOUS | Status: AC
  Filled 2024-04-19: qty 5

## 2024-04-19 MED ORDER — ADULT MULTIVITAMIN W/MINERALS CH
1.0000 | ORAL_TABLET | Freq: Every day | ORAL | Status: DC
  Filled 2024-04-19: qty 1

## 2024-04-19 MED ORDER — FLUTICASONE FUROATE-VILANTEROL 100-25 MCG/ACT IN AEPB
1.0000 | INHALATION_SPRAY | Freq: Every day | RESPIRATORY_TRACT | Status: DC
  Filled 2024-04-19: qty 28

## 2024-04-19 MED ORDER — AMLODIPINE BESYLATE 5 MG PO TABS
5.0000 mg | ORAL_TABLET | Freq: Every day | ORAL | Status: DC
  Administered 2024-04-20: 5 mg via ORAL
  Filled 2024-04-19: qty 1

## 2024-04-19 NOTE — ED Triage Notes (Addendum)
 Patient reports he had CTA done today and told to come here for PE to right lung. Patient denies symptoms. Patient is alert and oriented x 4. Airway patent, respirations even and unlabored. Skin normal, warm and dry. Patient currently on xarelto .

## 2024-04-19 NOTE — Telephone Encounter (Signed)
 CT scan chest abdomen and pelvis shows chronic changes of his known lung cancer and prior treatments for that, no evidence of metastasis of the colon cancer, but a new diagnosis of a right sided pulmonary embolism with evidence of right heart strain.  I was unable to reach Martin Bailey by phone just now so I left a voicemail with this information and instructed him to go to the emergency department Martin Bailey today.  Because I could not reach him, I also called his daughter Martin Bailey and left her a voicemail with similar information.  Lastly, I have notified Dr. Wilhelmenia (on-call tonight for our practice) and copied him on this note so we can call the patient this evening and ensure receipt of this information and plan.  VEAR Brand MD

## 2024-04-19 NOTE — Telephone Encounter (Signed)
 I tried to call the patient this afternoon and it went directly to voicemail for both mobile and home numbers. I did call the patient's daughter and she answered the phone.  She has had difficulty being able to reach her dad today but that is not uncommon per her report. She is going to try to recheck out to him or try to see where he may be at (she thinks he may be playing bridge). I went over the results of the CT scan and in the setting of the new pulmonary embolism that is noted as well as the concern for right heart strain, it is recommended that he be further evaluated in the emergency room for likely inpatient evaluation and observation to get an echocardiogram and likely get initiated on blood thinners. I am on-call for [facility] overnight if there are further questions, but at this point the next steps are to come into the emergency department and she is going to relay that importance to her dad and get him to come in this evening. For this back to Dr. Legrand his primary GI and also to his primary oncologist. Our team will ensure that tomorrow he has already coming to the hospital if not to reach out once again but again it is expected he will come to [facility] long tonight.   Aloha Finner, MD  Gastroenterology [facility] Office # [phone]

## 2024-04-19 NOTE — Progress Notes (Signed)
 PHARMACY - ANTICOAGULATION CONSULT NOTE  Pharmacy Consult for IV heparin  Indication: pulmonary embolus  Allergies[1]  Patient Measurements:    Vital Signs: Temp: 97.4 F (36.3 C) (01/21 1748) Temp Source: Oral (01/21 1748) BP: 145/99 (01/21 1748) Pulse Rate: 87 (01/21 1748)  Labs: Recent Labs    04/19/24 1100 04/19/24 1909  HGB 14.5 14.2  HCT 43.3 41.9  PLT 162 146*  CREATININE 0.99 0.83    Estimated Creatinine Clearance: 79.4 mL/min (by C-G formula based on SCr of 0.83 mg/dL).   Medical History: Past Medical History:  Diagnosis Date   Arthritis    Cataract    left   Colon cancer (HCC) 04/18/2024   COLONIC POLYPS, HX OF 04/05/2007   ONE ADENOMATOUS POLYP AND ONE HYPERPLASTIC POLYP   DEPRESSION 03/04/2007   DERMATITIS, SEBORRHEIC 07/19/2007   Dysrhythmia    FIBRILLATION, ATRIAL 03/04/2007   Hearing aid worn    B/L   History of kidney stones    HYPERLIPIDEMIA 03/04/2007   HYPERTENSION 03/04/2007   Lung cancer (HCC)    non small cell lung ca   Lung mass    mediastinal adenopathy    Medications:  Scheduled:   amLODipine   5 mg Oral Daily   atenolol   50 mg Oral BID   atorvastatin   10 mg Oral Daily   [START ON 04/20/2024] fluticasone  furoate-vilanterol  1 puff Inhalation Daily   multivitamin with minerals  1 tablet Oral Daily   PreserVision AREDS 2  1 capsule Oral BID    Assessment: Pharmacy is consulted to start heparin  drip on 84 yo male diagnosed with PE. Pt had been on Xarelto  20 mg daily which was held for EGD. Resumed on 1/20 with LD being on 1/21 at 0700  Baseline labs IP   Today, 04/19/24 Hgb 14.2, plt 146  SCr 0.83 mg/dl, 79 ml/min  As per discussion with admitting [facility], will start heparin  tonight since newly diagnosed PE.   Goal of Therapy:  INR 2-3 Heparin  level 0.3-0.7 units/ml aPTT 66-102  Monitor platelets by anticoagulation protocol: Yes   Plan:  Will avoid bolus since had Xarelto  this AM  Start heparin  at 1500 units/hr Obtain  aPTT/HL 8 hours after start of infusion Daily CBC Monitor for signs and symptoms of bleeding   Dolphus Roller, PharmD, BCPS 04/19/2024 9:20 PM       [1]  Allergies Allergen Reactions   Hydrocodone  Other (See Comments)    Lost my appetite, loss of energy

## 2024-04-19 NOTE — Telephone Encounter (Signed)
 Received critical call report on pt regarding ct results:  IMPRESSION: 1. Right lower lobe pulmonary artery embolus with evidence of right heart strain. Correlation with EKG and troponin level recommended. 2. Similar appearance of right perihilar consolidation extending into the right lower lobe with air bronchogram likely post radiation changes. 3. Similar appearance of subsolid nodular density in the right lower lobe. The previously seen 7 mm nodule at the left lung base and subsolid nodule in the medial right lower lobe are not identified on today's exam. 4. Focal area of luminal narrowing and thickening of the mid transverse colon may correspond to the known transverse colon mass. No bowel obstruction. 5. A 5 mm focus of nodularity along the right lateral bladder wall. Further evaluation with cystoscopy is recommended. 6.  Aortic Atherosclerosis (ICD10-I70.0).

## 2024-04-19 NOTE — ED Provider Notes (Signed)
 " [place] EMERGENCY DEPARTMENT AT [facility] Provider Note  CSN: [id number] Arrival date & time: 04/19/24 1739  Chief Complaint(s) Pulmonary Embolus Right Lyng  HPI Martin Bailey is a 84 y.o. male with past medical history as below, significant for colon cancer, afib on xarelto , htn, hld, lung ca who presents to the ED with complaint of abnormal CT  Pt was sent by GI Dr Legrand for abnormal outpatient CT. CT earlier today shows right sided PE with right heart strain.  He has no chest pain or dyspnea.  No leg swelling.  No recent travel.  Patient reports that he was taken off his Xarelto  in preparation for EGD on last Monday.  Denies history of prior PE.  Past Medical History Past Medical History:  Diagnosis Date   Arthritis    Cataract    left   Colon cancer (HCC) 04/18/2024   COLONIC POLYPS, HX OF 04/05/2007   ONE ADENOMATOUS POLYP AND ONE HYPERPLASTIC POLYP   DEPRESSION 03/04/2007   DERMATITIS, SEBORRHEIC 07/19/2007   Dysrhythmia    FIBRILLATION, ATRIAL 03/04/2007   Hearing aid worn    B/L   History of kidney stones    HYPERLIPIDEMIA 03/04/2007   HYPERTENSION 03/04/2007   Lung cancer (HCC)    non small cell lung ca   Lung mass    mediastinal adenopathy   Patient Active Problem List   Diagnosis Date Noted   Recurrent major depressive disorder, in full remission 09/15/2022   Elevated coronary artery calcium  score 05/02/2020   Abnormal findings on diagnostic imaging of lung 12/15/2019   Drug-induced pneumonitis 09/19/2019   Encounter for antineoplastic immunotherapy 03/29/2019   Stage III squamous cell carcinoma of right lung (HCC) 01/06/2019   Goals of care, counseling/discussion 01/06/2019   Overweight 08/09/2017   Elevated TSH 09/21/2014   Former smoker 06/21/2014   Alcoholism (HCC) 06/21/2014   Allergic rhinitis 04/06/2014   Hx of adenomatous colonic polyps 03/05/2014   Chronic anticoagulation 03/05/2014   Testosterone  deficiency 09/02/2010    Seborrheic dermatitis 07/19/2007   Hyperlipidemia 03/04/2007   Major depression (HCC) 03/04/2007   Essential hypertension 03/04/2007   Atrial fibrillation (HCC) 03/04/2007   Home Medication(s) Prior to Admission medications  Medication Sig Start Date End Date Taking? Authorizing Provider  amLODipine  (NORVASC ) 5 MG tablet TAKE 1 TABLET BY MOUTH DAILY 02/03/24   Katrinka Garnette KIDD, MD  atenolol  (TENORMIN ) 50 MG tablet TAKE 1 TABLET BY MOUTH TWICE  DAILY 02/03/24   Katrinka Garnette KIDD, MD  atorvastatin  (LIPITOR) 10 MG tablet TAKE 1 TABLET BY MOUTH DAILY 02/03/24   Katrinka Garnette KIDD, MD  budesonide -formoterol  (SYMBICORT ) 80-4.5 MCG/ACT inhaler Inhale 2 puffs into the lungs in the morning and at bedtime. 03/06/24   Kassie Acquanetta Bradley, MD  Multiple Vitamin (MULTIVITAMIN WITH MINERALS) TABS tablet Take 1 tablet by mouth daily.    [provider]  Multiple Vitamins-Minerals (PRESERVISION AREDS 2) CAPS Take 1 capsule by mouth 2 (two) times daily.    [provider]  XARELTO  20 MG TABS tablet TAKE 1 TABLET BY MOUTH DAILY  WITH SUPPER 07/28/23   Katrinka Garnette KIDD, MD  Past Surgical History Past Surgical History:  Procedure Laterality Date   BIOPSY  01/09/2020   Procedure: BIOPSY;  Surgeon: Neda Jennet LABOR, MD;  Location: [facility];  Service: Endoscopy;;   BRONCHIAL WASHINGS  01/09/2020   Procedure: BRONCHIAL WASHINGS;  Surgeon: Neda Jennet LABOR, MD;  Location: [facility];  Service: Endoscopy;;   CATARACT EXTRACTION W/ INTRAOCULAR LENS IMPLANT     right   COLONOSCOPY     IR IMAGING GUIDED PORT INSERTION  01/31/2019   JOINT REPLACEMENT  06/23/2023   PARTIAL KNEE ARTHROPLASTY Right 06/23/2023   Procedure: UNICOMPARTMENTAL KNEE-MEDIAL;  Surgeon: [name] Toribio LABOR, MD;  Location: [facility];  Service: Orthopedics;  Laterality: Right;   RADIOFREQUENCY  ABLATION     x2 for fib, on 2nd attempt switched form a flutter ot a fib.    ROTATOR CUFF REPAIR Right    TONSILLECTOMY     VIDEO BRONCHOSCOPY N/A 01/09/2020   Procedure: VIDEO BRONCHOSCOPY WITH FLUORO;  Surgeon: Neda Jennet LABOR, MD;  Location: [facility];  Service: Endoscopy;  Laterality: N/A;   VIDEO BRONCHOSCOPY WITH ENDOBRONCHIAL ULTRASOUND Left 12/30/2018   Procedure: VIDEO BRONCHOSCOPY WITH ENDOBRONCHIAL ULTRASOUND WITH FLUOROSCOPY;  Surgeon: Neda Jennet LABOR, MD;  Location: [facility] OR;  Service: Thoracic;  Laterality: Left;   WISDOM TOOTH EXTRACTION     Family History Family History  Problem Relation Age of Onset   Osteoporosis Mother    Dementia Mother    Lung cancer Father        former smoker   Cancer Father    Colon cancer Neg Hx    Stomach cancer Neg Hx    Esophageal cancer Neg Hx    Rectal cancer Neg Hx     Social History Social History[1] Allergies Hydrocodone   Review of Systems A thorough review of systems was obtained and all systems are negative except as noted in the HPI and PMH.   Physical Exam Vital Signs  I have reviewed the triage vital signs BP (!) 145/99 (BP Location: Right Arm)   Pulse 87   Temp (!) 97.4 F (36.3 C) (Oral)   Resp 16   SpO2 100%  Physical Exam Vitals and nursing note reviewed.  Constitutional:      General: He is not in acute distress.    Appearance: Normal appearance. He is well-developed. He is not ill-appearing.  HENT:     Head: Normocephalic and atraumatic.     Right Ear: External ear normal.     Left Ear: External ear normal.     Nose: Nose normal.     Mouth/Throat:     Mouth: Mucous membranes are moist.  Eyes:     General: No scleral icterus.       Right eye: No discharge.        Left eye: No discharge.  Cardiovascular:     Rate and Rhythm: Normal rate.  Pulmonary:     Effort: Pulmonary effort is normal. No respiratory distress.     Breath sounds: No stridor.  Abdominal:     General: Abdomen is flat. There  is no distension.     Tenderness: There is no guarding.  Musculoskeletal:        General: No deformity.     Cervical back: No rigidity.  Skin:    General: Skin is warm and dry.     Coloration: Skin is not cyanotic, jaundiced or pale.  Neurological:     Mental Status: He is alert.  Psychiatric:  Speech: Speech normal.        Behavior: Behavior normal. Behavior is cooperative.     ED Results and Treatments Labs (all labs ordered are listed, but only abnormal results are displayed) Labs Reviewed  BASIC METABOLIC PANEL WITH GFR - Abnormal; Notable for the following components:      Result Value   Sodium 134 (*)    Glucose, Bld 100 (*)    All other components within normal limits  CBC - Abnormal; Notable for the following components:   RDW 16.1 (*)    Platelets 146 (*)    All other components within normal limits  TROPONIN T, HIGH SENSITIVITY  TROPONIN T, HIGH SENSITIVITY                                                                                                                          Radiology CT CHEST ABDOMEN PELVIS W CONTRAST Result Date: 04/19/2024 CLINICAL DATA:  Colon and lung cancer.  Staging. EXAM: CT CHEST, ABDOMEN, AND PELVIS WITH CONTRAST TECHNIQUE: Multidetector CT imaging of the chest, abdomen and pelvis was performed following the standard protocol during bolus administration of intravenous contrast. RADIATION DOSE REDUCTION: This exam was performed according to the departmental dose-optimization program which includes automated exposure control, adjustment of the mA and/or kV according to patient size and/or use of iterative reconstruction technique. CONTRAST:  OMNIPAQUE  IOHEXOL  300 MG/ML  SOLN COMPARISON:  Chest CT dated 01/19/2024 FINDINGS: CT CHEST FINDINGS Cardiovascular: There is dilatation of the left atrium similar to prior CT. No pericardial effusion. Coronary vascular calcification. Right-sided Port-A-Cath with tip in the region of the  cavoatrial junction. Mild atherosclerotic calcification of the thoracic aorta. No as well dilatation or dissection. The origins of the great vessels of the aortic arch appear patent. There is thrombus in the lobar branch of the right lower lobe pulmonary artery. The RV/LV ratio is approximately 1.0 suggestive of right heart strain. Correlation with EKG and troponin level recommended. Mediastinum/Nodes: No hilar or mediastinal adenopathy. Evaluation of the right hilar lymph node is however limited due to consolidative changes of the adjacent lung. Similar appearance of a small lymph node in the anterior mediastinum. The soft a Gus is grossly under. Small amount of fluid along the esophagus. Lungs/Pleura: Similar appearance of right perihilar consolidation extending into the right lower lobe with air bronchogram likely post radiation changes. The previously seen 7 mm nodule at the left lung base is not identified on today's exam. The previously described subsolid nodule in the medial right lower lobe is not identified on today's exam. An 18 x 6 mm subsolid nodular density in the right lower lobe (96/6) similar to prior CT. No new nodule. There is a small right pleural effusion similar to prior CT. No pneumothorax. The central airways are patent. Musculoskeletal: Osteopenia with degenerative changes of the spine. No acute osseous pathology. No suspicious bone lesions. Similar appearance of compression fracture of superior endplate of T7. CT ABDOMEN PELVIS FINDINGS  No intra-abdominal free air or free fluid. Hepatobiliary: The liver is unremarkable. No biliary dilatation. The gallbladder is unremarkable Pancreas: Unremarkable. No pancreatic ductal dilatation or surrounding inflammatory changes. Spleen: Normal in size without focal abnormality. Adrenals/Urinary Tract: The adrenal glands unremarkable. There is no hydronephrosis on either side. There is symmetric enhancement and excretion of contrast by both kidneys. The  visualized ureters appear unremarkable. There is a 5 mm focus of nodularity along the right lateral bladder wall (116/2 and 92/4) which is not characterized on this CT. A bladder mass is not excluded further evaluation with cystoscopy is recommended. Stomach/Bowel: Focal area of luminal narrowing and thickening of the mid transverse colon (71/2 and coronal 30/4) may correspond to the known transverse colon mass. There is moderate stool throughout the colon. There is no bowel obstruction or active inflammation. The appendix is normal. Vascular/Lymphatic: Moderate aortoiliac atherosclerotic disease. The IVC is unremarkable. No portal venous gas. There is no adenopathy. Reproductive: The prostate and seminal vesicles are grossly unremarkable. Other: Small fat containing bilateral inguinal hernias. Musculoskeletal: Osteopenia with degenerative changes. Bilateral L5 pars defects with grade 1 anterolisthesis. No acute osseous pathology. IMPRESSION: 1. Right lower lobe pulmonary artery embolus with evidence of right heart strain. Correlation with EKG and troponin level recommended. 2. Similar appearance of right perihilar consolidation extending into the right lower lobe with air bronchogram likely post radiation changes. 3. Similar appearance of subsolid nodular density in the right lower lobe. The previously seen 7 mm nodule at the left lung base and subsolid nodule in the medial right lower lobe are not identified on today's exam. 4. Focal area of luminal narrowing and thickening of the mid transverse colon may correspond to the known transverse colon mass. No bowel obstruction. 5. A 5 mm focus of nodularity along the right lateral bladder wall. Further evaluation with cystoscopy is recommended. 6.  Aortic Atherosclerosis (ICD10-I70.0). These results will be called to the ordering clinician or representative by the Radiologist Assistant, and communication documented in the PACS or [employer]. Electronically Signed    By: Vanetta Chou M.D.   On: 04/19/2024 14:04    Pertinent labs & imaging results that were available during my care of the patient were reviewed by me and considered in my medical decision making (see MDM for details).  Medications Ordered in ED Medications - No data to display                                                                                                                                   Procedures .Critical Care  Performed by: Elnor Jayson LABOR, DO Authorized by: Elnor Jayson LABOR, DO   Critical care provider statement:    Critical care time (minutes):  30   Critical care time was exclusive of:  Separately billable procedures and treating other patients   Critical care was necessary to treat or prevent imminent or life-threatening deterioration of the following conditions:  Circulatory failure   Critical care was time spent personally by me on the following activities:  Development of treatment plan with patient or surrogate, discussions with consultants, evaluation of patient's response to treatment, examination of patient, ordering and review of laboratory studies, ordering and review of radiographic studies, ordering and performing treatments and interventions, pulse oximetry, re-evaluation of patient's condition, review of old charts and obtaining history from patient or surrogate   Care discussed with: admitting provider     (including critical care time)  Medical Decision Making / ED Course    Medical Decision Making:    Pruitt Taboada is a 84 y.o. male with past medical history as below, significant for colon cancer, afib on xarelto , htn, hld, lung ca who presents to the ED with complaint of abnormal CT . The complaint involves an extensive differential diagnosis and also carries with it a high risk of complications and morbidity.  Serious etiology was considered. Ddx includes but is not limited to: PE, PE, ACS, acute CHF, etc.  Complete initial  physical exam performed, notably the patient was in no acute distress.    Reviewed and confirmed nursing documentation for past medical history, family history, social history.  Vital signs reviewed.    Right lower lobe  PE Right heart strain> - Patient has no complaints, he is HDS, no hypoxia or chest pain, no leg swelling - He has history of A-fib, Xarelto  was held last week in preparation for EGD on Monday.  He is back on his Xarelto  today - Given presence of right heart strain, will start heparin , plan admission for PE - Labs stable, EKG stable   Clinical Course as of 04/19/24 2025  Wed Apr 19, 2024  2022 PSI/PORT class 4 [SG]  2024 CT earlier today:  IMPRESSION: 1. Right lower lobe pulmonary artery embolus with evidence of right heart strain. Correlation with EKG and troponin level recommended. 2. Similar appearance of right perihilar consolidation extending into the right lower lobe with air bronchogram likely post radiation changes. 3. Similar appearance of subsolid nodular density in the right lower lobe. The previously seen 7 mm nodule at the left lung base and subsolid nodule in the medial right lower lobe are not identified on today's exam. 4. Focal area of luminal narrowing and thickening of the mid transverse colon may correspond to the known transverse colon mass. No bowel obstruction. 5. A 5 mm focus of nodularity along the right lateral bladder wall. Further evaluation with cystoscopy is recommended. 6.  Aortic Atherosclerosis (ICD10-I70.0). [SG]    Clinical Course User Index [SG] Elnor Jayson LABOR, DO     Admit [facility]               Additional history obtained: -Additional history obtained from na -External records from outside source obtained and reviewed including: Chart review including previous notes, labs, imaging, consultation notes including  Recent outpatient CT Home meds Prior labs   Lab Tests: -I ordered, reviewed, and interpreted  labs.   The pertinent results include:   Labs Reviewed  BASIC METABOLIC PANEL WITH GFR - Abnormal; Notable for the following components:      Result Value   Sodium 134 (*)    Glucose, Bld 100 (*)    All other components within normal limits  CBC - Abnormal; Notable for the following components:   RDW 16.1 (*)    Platelets 146 (*)    All other components within normal limits  TROPONIN T, HIGH SENSITIVITY  TROPONIN  T, HIGH SENSITIVITY    Notable for labs stable  EKG   EKG Interpretation Date/Time:    Ventricular Rate:    PR Interval:    QRS Duration:    QT Interval:    QTC Calculation:   R Axis:      Text Interpretation:           Imaging Studies ordered: CT chest abdomen pelvis from earlier today outpatient  Medicines ordered and prescription drug management: No orders of the defined types were placed in this encounter.   -I have reviewed the patients home medicines and have made adjustments as needed   Consultations Obtained: I requested consultation with the hospitalist,  and discussed lab and imaging findings as well as pertinent plan    Cardiac Monitoring: The patient was maintained on a cardiac monitor.  I personally viewed and interpreted the cardiac monitored which showed an underlying rhythm of: afib Continuous pulse oximetry interpreted by myself, 100% on RA.    Social Determinants of Health:  Diagnosis or treatment significantly limited by social determinants of health: former smoker   Reevaluation: After the interventions noted above, I reevaluated the patient and found that they have stayed the same  Co morbidities that complicate the patient evaluation  Past Medical History:  Diagnosis Date   Arthritis    Cataract    left   Colon cancer (HCC) 04/18/2024   COLONIC POLYPS, HX OF 04/05/2007   ONE ADENOMATOUS POLYP AND ONE HYPERPLASTIC POLYP   DEPRESSION 03/04/2007   DERMATITIS, SEBORRHEIC 07/19/2007   Dysrhythmia    FIBRILLATION, ATRIAL  03/04/2007   Hearing aid worn    B/L   History of kidney stones    HYPERLIPIDEMIA 03/04/2007   HYPERTENSION 03/04/2007   Lung cancer (HCC)    non small cell lung ca   Lung mass    mediastinal adenopathy      Dispostion: Disposition decision including need for hospitalization was considered, and patient admitted to the hospital.    Final Clinical Impression(s) / ED Diagnoses Final diagnoses:  Other acute pulmonary embolism, unspecified whether acute cor pulmonale present (HCC)  Aortic atherosclerosis  Lung nodule  Lesion of bladder         [1]  Social History Tobacco Use   Smoking status: Former    Current packs/day: 0.00    Average packs/day: 1.5 packs/day for 10.0 years (15.0 ttl pk-yrs)    Types: Cigarettes, Pipe    Start date: 07/09/1967    Quit date: 07/08/1977    Years since quitting: 46.8   Smokeless tobacco: Never   Tobacco comments:    quit 1979  Vaping Use   Vaping status: Never Used  Substance Use Topics   Alcohol use: Yes    Alcohol/week: 28.0 standard drinks of alcohol    Types: 28 Standard drinks or equivalent per week    Comment: 4 scotch's a day   Drug use: Never     Elnor Jayson LABOR, DO 04/19/24 2026  "

## 2024-04-19 NOTE — Telephone Encounter (Signed)
 [facility] Pathology contacted. Need the pathology report released into EPIC. Stat imaging ordered. Lab test ordered. Referral to surgeon placed. Will forward more as test results are released.

## 2024-04-19 NOTE — Telephone Encounter (Signed)
Pathology report faxed to [facility] Surgery.

## 2024-04-19 NOTE — H&P (Signed)
 " History and Physical    Martin Bailey FMW:[id number] DOB: April 17, 1940 DOA: 04/19/2024  PCP: Katrinka Garnette KIDD, MD  Patient coming from: Home.  Called by GI Dr. Due to abnormal CT scan.  I have personally briefly reviewed patient's old medical records available.   Chief Complaint: I have no complaints, feel fine.  I was told I have blood clot  HPI: Martin Bailey is a 84 y.o. male with medical history significant of stage III lung cancer treated with chemotherapy and subsequent immunotherapy under surveillance from Dr. Gatha, hypertension, chronic fatigue and shortness of breath that improved with the use of Symbicort  and albuterol, chronic A-fib on Xarelto  20 mg daily, patient was suffering from fatigue and weakness for last few months, was found to have FOBT positive and iron deficiency anemia.  Patient underwent upper GI endoscopy and colonoscopy on 1/19.  He was asked to hold Xarelto  for 2 days prior to his procedure however he did held his Xarelto  for 5 days and restarted on 1/20.  His upper GI endoscopy was essentially normal.  His colonoscopy showed a nonobstructive mass consistent with colon cancer about 3 cm in size mid transverse colon.  Patient was discharged home from endoscopy suite with orders to get CT scan chest abdomen pelvis with contrast for colon cancer staging.  Today, GI physician got results of the CT scan that shows known lung cancer with radiation changes, transverse colon mass.  However it also shows right segmental PE on the lower lobe with right ventricular strain.  Patient himself denies any complaints.  Patient denies any chest pain shortness of breath.  Patient tells me that he exercises about 1 hour every day and he did exercise that today.  Denies any leg pain or swelling.  Patient tells me that once pulmonary doctor started him on Symbicort  his breathing improved.   Patient follows with Dr. Gatha, 2-3 times a year.  I could not find mention of colon  cancer on his previous visit.  He is scheduled to visit him next week.  Biopsy results from the colonoscopy is pending.  ED Course: Hemodynamically stable.  100% on room air.  Electrolytes are adequate.  Without symptoms.  Started on heparin , advised observation and anticoagulation decision. EKG shows rate controlled A-fib and low voltage EKG.  Troponins are negative.  Review of Systems: all systems are reviewed and pertinent positive as per HPI otherwise rest are negative.    Past Medical History:  Diagnosis Date   Arthritis    Cataract    left   Colon cancer (HCC) 04/18/2024   COLONIC POLYPS, HX OF 04/05/2007   ONE ADENOMATOUS POLYP AND ONE HYPERPLASTIC POLYP   DEPRESSION 03/04/2007   DERMATITIS, SEBORRHEIC 07/19/2007   Dysrhythmia    FIBRILLATION, ATRIAL 03/04/2007   Hearing aid worn    B/L   History of kidney stones    HYPERLIPIDEMIA 03/04/2007   HYPERTENSION 03/04/2007   Lung cancer (HCC)    non small cell lung ca   Lung mass    mediastinal adenopathy    Past Surgical History:  Procedure Laterality Date   BIOPSY  01/09/2020   Procedure: BIOPSY;  Surgeon: Neda Jennet LABOR, MD;  Location: [facility];  Service: Endoscopy;;   BRONCHIAL WASHINGS  01/09/2020   Procedure: BRONCHIAL WASHINGS;  Surgeon: Neda Jennet LABOR, MD;  Location: [facility];  Service: Endoscopy;;   CATARACT EXTRACTION W/ INTRAOCULAR LENS IMPLANT     right   COLONOSCOPY     IR  IMAGING GUIDED PORT INSERTION  01/31/2019   JOINT REPLACEMENT  06/23/2023   PARTIAL KNEE ARTHROPLASTY Right 06/23/2023   Procedure: UNICOMPARTMENTAL KNEE-MEDIAL;  Surgeon: [name] Toribio LABOR, MD;  Location: [facility];  Service: Orthopedics;  Laterality: Right;   RADIOFREQUENCY ABLATION     x2 for fib, on 2nd attempt switched form a flutter ot a fib.    ROTATOR CUFF REPAIR Right    TONSILLECTOMY     VIDEO BRONCHOSCOPY N/A 01/09/2020   Procedure: VIDEO BRONCHOSCOPY WITH FLUORO;  Surgeon: Neda Jennet LABOR, MD;  Location:  [facility];  Service: Endoscopy;  Laterality: N/A;   VIDEO BRONCHOSCOPY WITH ENDOBRONCHIAL ULTRASOUND Left 12/30/2018   Procedure: VIDEO BRONCHOSCOPY WITH ENDOBRONCHIAL ULTRASOUND WITH FLUOROSCOPY;  Surgeon: Neda Jennet LABOR, MD;  Location: [facility];  Service: Thoracic;  Laterality: Left;   WISDOM TOOTH EXTRACTION      Social history   reports that he quit smoking about 46 years ago. His smoking use included cigarettes and pipe. He started smoking about 56 years ago. He has a 15 pack-year smoking history. He has never used smokeless tobacco. He reports current alcohol use of about 28.0 standard drinks of alcohol per week. He reports that he does not use drugs.  Allergies[1]  Family History  Problem Relation Age of Onset   Osteoporosis Mother    Dementia Mother    Lung cancer Father        former smoker   Cancer Father    Colon cancer Neg Hx    Stomach cancer Neg Hx    Esophageal cancer Neg Hx    Rectal cancer Neg Hx      Prior to Admission medications  Medication Sig Start Date End Date Taking? Authorizing Provider  amLODipine  (NORVASC ) 5 MG tablet TAKE 1 TABLET BY MOUTH DAILY 02/03/24   Katrinka Garnette KIDD, MD  atenolol  (TENORMIN ) 50 MG tablet TAKE 1 TABLET BY MOUTH TWICE  DAILY 02/03/24   Katrinka Garnette KIDD, MD  atorvastatin  (LIPITOR) 10 MG tablet TAKE 1 TABLET BY MOUTH DAILY 02/03/24   Katrinka Garnette KIDD, MD  budesonide -formoterol  (SYMBICORT ) 80-4.5 MCG/ACT inhaler Inhale 2 puffs into the lungs in the morning and at bedtime. 03/06/24   Kassie Acquanetta Bradley, MD  Multiple Vitamin (MULTIVITAMIN WITH MINERALS) TABS tablet Take 1 tablet by mouth daily.    [provider]  Multiple Vitamins-Minerals (PRESERVISION AREDS 2) CAPS Take 1 capsule by mouth 2 (two) times daily.    [provider]  XARELTO  20 MG TABS tablet TAKE 1 TABLET BY MOUTH DAILY  WITH SUPPER 07/28/23   Katrinka Garnette KIDD, MD    Physical Exam: Vitals:   04/19/24 1748  BP: (!) 145/99  Pulse: 87  Resp: 16   Temp: (!) 97.4 F (36.3 C)  TempSrc: Oral  SpO2: 100%    Constitutional: NAD, calm, comfortable.  Pleasant interaction. Vitals:   04/19/24 1748  BP: (!) 145/99  Pulse: 87  Resp: 16  Temp: (!) 97.4 F (36.3 C)  TempSrc: Oral  SpO2: 100%   Eyes: PERRL, lids and conjunctivae normal ENMT: Mucous membranes are moist. Posterior pharynx clear of any exudate or lesions.Normal dentition.  Neck: normal, supple, no masses, no thyromegaly Respiratory: clear to auscultation bilaterally, no wheezing, no crackles. Normal respiratory effort. No accessory muscle use.  Port-A-Cath right chest wall. Cardiovascular: Irregularly irregular, no murmurs / rubs / gallops. No extremity edema. 2+ pedal pulses. No carotid bruits.  Abdomen: no tenderness, no masses palpated. No hepatosplenomegaly. Bowel sounds positive.  Musculoskeletal:  no clubbing / cyanosis. No joint deformity upper and lower extremities. Good ROM, no contractures. Normal muscle tone.  Skin: no rashes, lesions, ulcers. No induration Neurologic: CN 2-12 grossly intact. Sensation intact, DTR normal. Strength 5/5 in all 4.  Psychiatric: Normal judgment and insight. Alert and oriented x 3. Normal mood.     Labs on Admission: I have personally reviewed following labs and imaging studies  CBC: Recent Labs  Lab 04/19/24 1100 04/19/24 1909  WBC 9.2 9.8  NEUTROABS 6.1  --   HGB 14.5 14.2  HCT 43.3 41.9  MCV 95.2 96.3  PLT 162 146*   Basic Metabolic Panel: Recent Labs  Lab 04/19/24 1100 04/19/24 1909  NA 138 134*  K 4.4 4.3  CL 101 102  CO2 26 22  GLUCOSE 65* 100*  BUN 10 9  CREATININE 0.99 0.83  CALCIUM  9.1 8.9   GFR: Estimated Creatinine Clearance: 79.4 mL/min (by C-G formula based on SCr of 0.83 mg/dL). Liver Function Tests: Recent Labs  Lab 04/19/24 1100  AST 24  ALT 17  ALKPHOS 80  BILITOT 0.5  PROT 7.4  ALBUMIN 4.3   No results for input(s): LIPASE, AMYLASE in the last 168 hours. No results for  input(s): AMMONIA in the last 168 hours. Coagulation Profile: No results for input(s): INR, PROTIME in the last 168 hours. Cardiac Enzymes: No results for input(s): CKTOTAL, CKMB, CKMBINDEX, TROPONINI in the last 168 hours. BNP (last 3 results) No results for input(s): PROBNP in the last 8760 hours. HbA1C: No results for input(s): HGBA1C in the last 72 hours. CBG: No results for input(s): GLUCAP in the last 168 hours. Lipid Profile: No results for input(s): CHOL, HDL, LDLCALC, TRIG, CHOLHDL, LDLDIRECT in the last 72 hours. Thyroid  Function Tests: No results for input(s): TSH, T4TOTAL, FREET4, T3FREE, THYROIDAB in the last 72 hours. Anemia Panel: No results for input(s): VITAMINB12, FOLATE, FERRITIN, TIBC, IRON, RETICCTPCT in the last 72 hours. Urine analysis:    Component Value Date/Time   COLORURINE YELLOW 02/10/2018 1058   APPEARANCEUR CLEAR 02/10/2018 1058   LABSPEC <=1.005 (A) 02/10/2018 1058   PHURINE 7.0 02/10/2018 1058   GLUCOSEU NEGATIVE 02/10/2018 1058   HGBUR NEGATIVE 02/10/2018 1058   HGBUR negative 04/11/2009 0754   BILIRUBINUR NEGATIVE 02/10/2018 1058   BILIRUBINUR Negative 02/09/2017 0912   KETONESUR NEGATIVE 02/10/2018 1058   PROTEINUR Negative 02/09/2017 0912   UROBILINOGEN 0.2 02/10/2018 1058   NITRITE NEGATIVE 02/10/2018 1058   LEUKOCYTESUR NEGATIVE 02/10/2018 1058    Radiological Exams on Admission: CT CHEST ABDOMEN PELVIS W CONTRAST Result Date: 04/19/2024 CLINICAL DATA:  Colon and lung cancer.  Staging. EXAM: CT CHEST, ABDOMEN, AND PELVIS WITH CONTRAST TECHNIQUE: Multidetector CT imaging of the chest, abdomen and pelvis was performed following the standard protocol during bolus administration of intravenous contrast. RADIATION DOSE REDUCTION: This exam was performed according to the departmental dose-optimization program which includes automated exposure control, adjustment of the mA and/or kV according  to patient size and/or use of iterative reconstruction technique. CONTRAST:  OMNIPAQUE  IOHEXOL  300 MG/ML  SOLN COMPARISON:  Chest CT dated 01/19/2024 FINDINGS: CT CHEST FINDINGS Cardiovascular: There is dilatation of the left atrium similar to prior CT. No pericardial effusion. Coronary vascular calcification. Right-sided Port-A-Cath with tip in the region of the cavoatrial junction. Mild atherosclerotic calcification of the thoracic aorta. No as well dilatation or dissection. The origins of the great vessels of the aortic arch appear patent. There is thrombus in the lobar branch of the right lower  lobe pulmonary artery. The RV/LV ratio is approximately 1.0 suggestive of right heart strain. Correlation with EKG and troponin level recommended. Mediastinum/Nodes: No hilar or mediastinal adenopathy. Evaluation of the right hilar lymph node is however limited due to consolidative changes of the adjacent lung. Similar appearance of a small lymph node in the anterior mediastinum. The soft a Gus is grossly under. Small amount of fluid along the esophagus. Lungs/Pleura: Similar appearance of right perihilar consolidation extending into the right lower lobe with air bronchogram likely post radiation changes. The previously seen 7 mm nodule at the left lung base is not identified on today's exam. The previously described subsolid nodule in the medial right lower lobe is not identified on today's exam. An 18 x 6 mm subsolid nodular density in the right lower lobe (96/6) similar to prior CT. No new nodule. There is a small right pleural effusion similar to prior CT. No pneumothorax. The central airways are patent. Musculoskeletal: Osteopenia with degenerative changes of the spine. No acute osseous pathology. No suspicious bone lesions. Similar appearance of compression fracture of superior endplate of T7. CT ABDOMEN PELVIS FINDINGS No intra-abdominal free air or free fluid. Hepatobiliary: The liver is unremarkable. No  biliary dilatation. The gallbladder is unremarkable Pancreas: Unremarkable. No pancreatic ductal dilatation or surrounding inflammatory changes. Spleen: Normal in size without focal abnormality. Adrenals/Urinary Tract: The adrenal glands unremarkable. There is no hydronephrosis on either side. There is symmetric enhancement and excretion of contrast by both kidneys. The visualized ureters appear unremarkable. There is a 5 mm focus of nodularity along the right lateral bladder wall (116/2 and 92/4) which is not characterized on this CT. A bladder mass is not excluded further evaluation with cystoscopy is recommended. Stomach/Bowel: Focal area of luminal narrowing and thickening of the mid transverse colon (71/2 and coronal 30/4) may correspond to the known transverse colon mass. There is moderate stool throughout the colon. There is no bowel obstruction or active inflammation. The appendix is normal. Vascular/Lymphatic: Moderate aortoiliac atherosclerotic disease. The IVC is unremarkable. No portal venous gas. There is no adenopathy. Reproductive: The prostate and seminal vesicles are grossly unremarkable. Other: Small fat containing bilateral inguinal hernias. Musculoskeletal: Osteopenia with degenerative changes. Bilateral L5 pars defects with grade 1 anterolisthesis. No acute osseous pathology. IMPRESSION: 1. Right lower lobe pulmonary artery embolus with evidence of right heart strain. Correlation with EKG and troponin level recommended. 2. Similar appearance of right perihilar consolidation extending into the right lower lobe with air bronchogram likely post radiation changes. 3. Similar appearance of subsolid nodular density in the right lower lobe. The previously seen 7 mm nodule at the left lung base and subsolid nodule in the medial right lower lobe are not identified on today's exam. 4. Focal area of luminal narrowing and thickening of the mid transverse colon may correspond to the known transverse colon  mass. No bowel obstruction. 5. A 5 mm focus of nodularity along the right lateral bladder wall. Further evaluation with cystoscopy is recommended. 6.  Aortic Atherosclerosis (ICD10-I70.0). These results will be called to the ordering clinician or representative by the Radiologist Assistant, and communication documented in the PACS or [employer]. Electronically Signed   By: Vanetta Chou M.D.   On: 04/19/2024 14:04    EKG: Independently reviewed.  Rate controlled A-fib with low voltage.  No acute ischemic changes.  Assessment/Plan Principal Problem:   Acute pulmonary embolus [facility]) Active Problems:   Hyperlipidemia   Essential hypertension   Atrial fibrillation (HCC)   Stage  III squamous cell carcinoma of right lung (HCC)     1.  Acute pulmonary embolism with right ventricular strain: Patient hemodynamically stable.  He resumed Xarelto  yesterday after 6 days of break.  Patient is on room air and saturating 100%. Not likely thrombolysis or thrombectomy candidate given stability and having no symptoms as well on Xarelto . Observation, start heparin .  Echocardiogram to look for any clot burden or intraventricular blood clots. Patient would likely benefit with loading dose of Xarelto  and continuing Xarelto  as this is not likely Xarelto  failure.  Please discuss case with his oncologist, Dr. Sherrod about anticoagulation choices.  2.  Newly diagnosed abnormal colon: Likely colon cancer.  Biopsy results not available.  GI will follow-up.  This likely made him more thrombogenic.    3.  Hypertension: Blood pressure stable.  Continue amlodipine  and atenolol .  4.  Chronic A-fib: Rate controlled.  On atenolol .  Anticoagulation as above with heparin  today.  5.  Lung cancer with postoperative changes, postradiation changes and wheezing: Currently stable.  On albuterol and Symbicort .  Continued.    DVT prophylaxis: Heparin  infusion Code Status: Full code Family Communication: None at the  bedside Disposition Plan: Probable home tomorrow Consults called: None.  Will need to discuss with oncology Admission status: Observation.  Telemetry.   Renato Applebaum MD [facility]       [1]  Allergies Allergen Reactions   Hydrocodone  Other (See Comments)    Lost my appetite, loss of energy    "

## 2024-04-20 ENCOUNTER — Observation Stay (HOSPITAL_COMMUNITY)

## 2024-04-20 ENCOUNTER — Other Ambulatory Visit: Payer: Self-pay

## 2024-04-20 DIAGNOSIS — I2609 Other pulmonary embolism with acute cor pulmonale: Secondary | ICD-10-CM | POA: Diagnosis not present

## 2024-04-20 LAB — ECHOCARDIOGRAM COMPLETE
AR max vel: 2.22 cm2
AV Area VTI: 2.7 cm2
AV Area mean vel: 2.38 cm2
AV Mean grad: 3 mmHg
AV Peak grad: 5.6 mmHg
Ao pk vel: 1.19 m/s
Area-P 1/2: 3.33 cm2
Calc EF: 63.7 %
MV VTI: 1.9 cm2
S' Lateral: 2.3 cm
Single Plane A2C EF: 60.4 %
Single Plane A4C EF: 64.8 %

## 2024-04-20 LAB — APTT: aPTT: >200 s (ref 24–36)

## 2024-04-20 LAB — HEPARIN LEVEL (UNFRACTIONATED): Heparin Unfractionated: >1.1 [IU]/mL — ABNORMAL HIGH (ref 0.30–0.70)

## 2024-04-20 MED ORDER — RIVAROXABAN 15 MG PO TABS: 15.0000 mg | ORAL_TABLET | Freq: Two times a day (BID) | ORAL | Status: DC

## 2024-04-20 MED ORDER — RIVAROXABAN 20 MG PO TABS: 20.0000 mg | ORAL_TABLET | Freq: Every day | ORAL | Status: DC

## 2024-04-20 MED ORDER — HEPARIN SOD (PORK) LOCK FLUSH 100 UNIT/ML IV SOLN: 500.0000 [IU] | INTRAVENOUS | Status: DC | PRN

## 2024-04-20 MED ORDER — HEPARIN (PORCINE) 25000 UT/250ML-% IV SOLN
1300.0000 [IU]/h | INTRAVENOUS | Status: DC
  Administered 2024-04-20: 1300 [IU]/h via INTRAVENOUS

## 2024-04-20 MED ORDER — RIVAROXABAN 15 MG PO TABS: 15.0000 mg | ORAL_TABLET | Freq: Two times a day (BID) | ORAL | 0 refills | Status: AC

## 2024-04-20 MED ORDER — RIVAROXABAN 10 MG PO TABS: 20.0000 mg | ORAL_TABLET | Freq: Every day | ORAL | Status: DC

## 2024-04-20 MED ORDER — PERFLUTREN LIPID MICROSPHERE
1.0000 mL | INTRAVENOUS | Status: AC | PRN
  Administered 2024-04-20: 2 mL via INTRAVENOUS

## 2024-04-20 MED ORDER — RIVAROXABAN 15 MG PO TABS
15.0000 mg | ORAL_TABLET | Freq: Two times a day (BID) | ORAL | Status: DC
  Administered 2024-04-20: 15 mg via ORAL
  Filled 2024-04-20 (×2): qty 1

## 2024-04-20 NOTE — Discharge Summary (Signed)
 " Physician Discharge Summary   Patient: Martin Bailey MRN: [id number] DOB: 13-Jul-1940  Admit date:     04/19/2024  Discharge date: 04/20/24  Discharge Physician: MDALA-GAUSI, GOLDEN PILLOW   PCP: Katrinka Garnette KIDD, MD   Recommendations at discharge:   Follow-up with oncology as scheduled  Discharge Diagnoses: Principal Problem:   Acute pulmonary embolus (HCC) Active Problems:   Hyperlipidemia   Essential hypertension   Atrial fibrillation (HCC)   Stage III squamous cell carcinoma of right lung (HCC)  Resolved Problems:   * No resolved hospital problems. *  Hospital Course: Martin Bailey is a 84 y.o. male with PMH of stage III lung cancer treated with chemotherapy and subsequent immunotherapy under surveillance from Dr. Sherrod, hypertension, chronic fatigue and shortness of breath that improved with the use of Symbicort  and albuterol, chronic A-fib on Xarelto  20 mg daily.   Patient has been undergoing workup for anemia and had an EGD and colonoscopy on 1/19.  Colonoscopy revealed a mass and patient had a CT C/A/P with contrast for colon cancer staging.  This revealed acute pulmonary embolism.  Patient was started on heparin  drip.  Assessment and Plan:  Acute pulmonary embolism  Noted on CT done for colon cancer staging. Patient asymptomatic and not hypoxic. Patient had been off anticoagulation for his endoscopic procedures.  Started on heparin  gtt. Discussed with Dr. Gatha, who agreed with resuming Xarelto  (will start with loading dose). TTE showed EF 55 to 60%, no sign of right ventricular strain.  Patient was started on Xarelto  15 mg twice daily for 21 days, and then will revert to prior home dose of 20 mg daily. Patient cautioned to avoid strenuous exercise (strenuous gym workouts) for 21 days.    Likely colon cancer. Colonoscopy revealed colonic mass. Biopsy results pending. Patient will follow-up with gastroenterology and Oncology.    Hypertension Blood pressure stable.   Home amlodipine  and atenolol  were continued.    Chronic A-fib Rate controlled.   Had been on Xarelto  and atenolol . Xarelto  resumed as indicated above.    Lung cancer with postoperative changes, postradiation changes and wheezing Currently stable.   On albuterol and Symbicort .   Home medicines were continued.       Consultants: n/a Procedures performed: n/a  Disposition: Home Diet recommendation:  Discharge Diet Orders (From admission, onward)     Start     Ordered   04/20/24 0000  Diet general        04/20/24 1258           Regular diet DISCHARGE MEDICATION: Allergies as of 04/20/2024       Reactions   Hydrocodone  Other (See Comments)   Lost my appetite, loss of energy        Medication List     TAKE these medications    amLODipine  5 MG tablet Commonly known as: NORVASC  TAKE 1 TABLET BY MOUTH DAILY   atenolol  50 MG tablet Commonly known as: TENORMIN  TAKE 1 TABLET BY MOUTH TWICE  DAILY   atorvastatin  10 MG tablet Commonly known as: LIPITOR TAKE 1 TABLET BY MOUTH DAILY   budesonide -formoterol  80-4.5 MCG/ACT inhaler Commonly known as: Symbicort  Inhale 2 puffs into the lungs in the morning and at bedtime.   multivitamin with minerals Tabs tablet Take 1 tablet by mouth daily with breakfast.   PreserVision AREDS 2 Caps Take 1 capsule by mouth 2 (two) times daily.   Xarelto  20 MG Tabs tablet Generic drug: rivaroxaban  Take 1 tablet (20 mg total)  by mouth daily with supper. Resume after completion of 3 weeks of higher dose (15 mg BID) of Xarelto . What changed: additional instructions   Rivaroxaban  15 MG Tabs tablet Commonly known as: XARELTO  Take 1 tablet (15 mg total) by mouth 2 (two) times daily with a meal. After completion, resume your prior dose of Xarelto  (20 mg daily). What changed: You were already taking a medication with the same name, and this prescription was added. Make sure you understand how  and when to take each.        Discharge Exam:  Physical Exam on Day of Discharge   General: Alert, cheerful, oriented X3  Oral cavity: moist mucous membranes  Neck: supple  Chest: clear to auscultation. No crackles, no wheezes  CVS: S1,S2 RRR. No murmurs  Abd: No distention, soft, non-tender. No masses palpable  Extr: No edema    Condition at discharge: good  The results of significant diagnostics from this hospitalization (including imaging, microbiology, ancillary and laboratory) are listed below for reference.   Imaging Studies: ECHOCARDIOGRAM COMPLETE Result Date: 04/20/2024    ECHOCARDIOGRAM REPORT   Patient Name:   Martin Bailey Date of Exam: 04/20/2024 Medical Rec #:  [id number]            Height:       70.0 in Accession #:    [id number]           Weight:       202.0 lb Date of Birth:  06-08-1940             BSA:          2.096 m Patient Age:    83 years             BP:           141/93 mmHg Patient Gender: M                    HR:           72 bpm. Exam Location:  Inpatient Procedure: 2D Echo, Cardiac Doppler, Color Doppler and Intracardiac            Opacification Agent (Both Spectral and Color Flow Doppler were            utilized during procedure). Indications:    PE  History:        Patient has prior history of Echocardiogram examinations, most                 recent 05/12/2023.  Sonographer:    Odella Brewster Referring Phys: [id number] [facility]  Sonographer Comments: Technically difficult study due to poor echo windows. Image acquisition challenging due to respiratory motion. IMPRESSIONS  1. Left ventricular ejection fraction, by estimation, is 55 to 60%. The left ventricle has normal function. The left ventricle has no regional wall motion abnormalities. There is mild left ventricular hypertrophy. Left ventricular diastolic function could not be evaluated.  2. Right ventricular systolic function is normal. The right ventricular size is normal.  3. Left atrial size was  severely dilated.  4. Right atrial size was mildly dilated.  5. The mitral valve is degenerative. No evidence of mitral valve regurgitation. No evidence of mitral stenosis.  6. The aortic valve was not well visualized. Aortic valve regurgitation is not visualized. No aortic stenosis is present.  7. The inferior vena cava is dilated in size with >50% respiratory variability, suggesting right atrial pressure of 8 mmHg. Comparison(s): A prior  study was performed on 05/12/2023. No significant change from prior study. Conclusion(s)/Recommendation(s): No left ventricular mural or apical thrombus/thrombi. FINDINGS  Left Ventricle: Left ventricular ejection fraction, by estimation, is 55 to 60%. The left ventricle has normal function. The left ventricle has no regional wall motion abnormalities. Definity  contrast agent was given IV to delineate the left ventricular  endocardial borders. The left ventricular internal cavity size was small. There is mild left ventricular hypertrophy. Left ventricular diastolic function could not be evaluated due to atrial fibrillation. Left ventricular diastolic function could not be  evaluated. Right Ventricle: The right ventricular size is normal. Right vetricular wall thickness was not well visualized. Right ventricular systolic function is normal. Left Atrium: Left atrial size was severely dilated. Right Atrium: Right atrial size was mildly dilated. Pericardium: There is no evidence of pericardial effusion. Mitral Valve: The mitral valve is degenerative in appearance. Mild mitral annular calcification. No evidence of mitral valve regurgitation. No evidence of mitral valve stenosis. MV peak gradient, 7.1 mmHg. The mean mitral valve gradient is 2.0 mmHg. Tricuspid Valve: The tricuspid valve is normal in structure. Tricuspid valve regurgitation is trivial. No evidence of tricuspid stenosis. Aortic Valve: The aortic valve was not well visualized. Aortic valve regurgitation is not visualized.  No aortic stenosis is present. Aortic valve mean gradient measures 3.0 mmHg. Aortic valve peak gradient measures 5.6 mmHg. Aortic valve area, by VTI measures 2.70 cm. Pulmonic Valve: The pulmonic valve was not well visualized. Pulmonic valve regurgitation is not visualized. Aorta: The aortic root is normal in size and structure. Venous: The inferior vena cava is dilated in size with greater than 50% respiratory variability, suggesting right atrial pressure of 8 mmHg. IAS/Shunts: There is right bowing of the interatrial septum, suggestive of elevated left atrial pressure. The atrial septum is grossly normal. Additional Comments: A venous catheter is visualized.  LEFT VENTRICLE PLAX 2D LVIDd:         3.60 cm     Diastology LVIDs:         2.30 cm     LV e' medial:    7.29 cm/s LV PW:         1.50 cm     LV E/e' medial:  15.9 LV IVS:        1.20 cm     LV e' lateral:   9.90 cm/s LVOT diam:     2.30 cm     LV E/e' lateral: 11.7 LV SV:         59 LV SV Index:   28 LVOT Area:     4.15 cm LV IVRT:       120 msec  LV Volumes (MOD) LV vol d, MOD A2C: 86.9 ml LV vol d, MOD A4C: 80.4 ml LV vol s, MOD A2C: 34.4 ml LV vol s, MOD A4C: 28.3 ml LV SV MOD A2C:     52.5 ml LV SV MOD A4C:     80.4 ml LV SV MOD BP:      55.0 ml RIGHT VENTRICLE             IVC RV S prime:     10.00 cm/s  IVC diam: 2.10 cm TAPSE (M-mode): 1.9 cm                             PULMONARY VEINS  Diastolic Velocity: 40.40 cm/s                             S/D Velocity:       0.70                             Systolic Velocity:  29.00 cm/s LEFT ATRIUM              Index        RIGHT ATRIUM           Index LA diam:        5.90 cm  2.81 cm/m   RA Area:     24.60 cm LA Vol (A2C):   184.0 ml 87.78 ml/m  RA Volume:   79.50 ml  37.93 ml/m LA Vol (A4C):   177.0 ml 84.44 ml/m LA Biplane Vol: 190.0 ml 90.64 ml/m  AORTIC VALVE                    PULMONIC VALVE AV Area (Vmax):    2.22 cm     PV Vmax:          0.76 m/s AV Area (Vmean):    2.38 cm     PV Peak grad:     2.3 mmHg AV Area (VTI):     2.70 cm     PR End Diast Vel: 5.11 msec AV Vmax:           118.50 cm/s AV Vmean:          86.200 cm/s AV VTI:            0.220 m AV Peak Grad:      5.6 mmHg AV Mean Grad:      3.0 mmHg LVOT Vmax:         63.30 cm/s LVOT Vmean:        49.300 cm/s LVOT VTI:          0.143 m LVOT/AV VTI ratio: 0.65  AORTA Ao Root diam: 4.00 cm MITRAL VALVE MV Area (PHT): 3.33 cm     SHUNTS MV Area VTI:   1.90 cm     Systemic VTI:  0.14 m MV Peak grad:  7.1 mmHg     Systemic Diam: 2.30 cm MV Mean grad:  2.0 mmHg MV Vmax:       1.33 m/s MV Vmean:      68.6 cm/s MV E velocity: 116.00 cm/s Sunit Tolia Electronically signed by Madonna Large Signature Date/Time: 04/20/2024/12:04:10 PM    Final    CT CHEST ABDOMEN PELVIS W CONTRAST Result Date: 04/19/2024 CLINICAL DATA:  Colon and lung cancer.  Staging. EXAM: CT CHEST, ABDOMEN, AND PELVIS WITH CONTRAST TECHNIQUE: Multidetector CT imaging of the chest, abdomen and pelvis was performed following the standard protocol during bolus administration of intravenous contrast. RADIATION DOSE REDUCTION: This exam was performed according to the departmental dose-optimization program which includes automated exposure control, adjustment of the mA and/or kV according to patient size and/or use of iterative reconstruction technique. CONTRAST:  OMNIPAQUE  IOHEXOL  300 MG/ML  SOLN COMPARISON:  Chest CT dated 01/19/2024 FINDINGS: CT CHEST FINDINGS Cardiovascular: There is dilatation of the left atrium similar to prior CT. No pericardial effusion. Coronary vascular calcification. Right-sided Port-A-Cath with tip in the region of the cavoatrial junction. Mild atherosclerotic calcification of the thoracic aorta. No as well dilatation or dissection.  The origins of the great vessels of the aortic arch appear patent. There is thrombus in the lobar branch of the right lower lobe pulmonary artery. The RV/LV ratio is approximately 1.0 suggestive of right  heart strain. Correlation with EKG and troponin level recommended. Mediastinum/Nodes: No hilar or mediastinal adenopathy. Evaluation of the right hilar lymph node is however limited due to consolidative changes of the adjacent lung. Similar appearance of a small lymph node in the anterior mediastinum. The soft a Gus is grossly under. Small amount of fluid along the esophagus. Lungs/Pleura: Similar appearance of right perihilar consolidation extending into the right lower lobe with air bronchogram likely post radiation changes. The previously seen 7 mm nodule at the left lung base is not identified on today's exam. The previously described subsolid nodule in the medial right lower lobe is not identified on today's exam. An 18 x 6 mm subsolid nodular density in the right lower lobe (96/6) similar to prior CT. No new nodule. There is a small right pleural effusion similar to prior CT. No pneumothorax. The central airways are patent. Musculoskeletal: Osteopenia with degenerative changes of the spine. No acute osseous pathology. No suspicious bone lesions. Similar appearance of compression fracture of superior endplate of T7. CT ABDOMEN PELVIS FINDINGS No intra-abdominal free air or free fluid. Hepatobiliary: The liver is unremarkable. No biliary dilatation. The gallbladder is unremarkable Pancreas: Unremarkable. No pancreatic ductal dilatation or surrounding inflammatory changes. Spleen: Normal in size without focal abnormality. Adrenals/Urinary Tract: The adrenal glands unremarkable. There is no hydronephrosis on either side. There is symmetric enhancement and excretion of contrast by both kidneys. The visualized ureters appear unremarkable. There is a 5 mm focus of nodularity along the right lateral bladder wall (116/2 and 92/4) which is not characterized on this CT. A bladder mass is not excluded further evaluation with cystoscopy is recommended. Stomach/Bowel: Focal area of luminal narrowing and thickening of the  mid transverse colon (71/2 and coronal 30/4) may correspond to the known transverse colon mass. There is moderate stool throughout the colon. There is no bowel obstruction or active inflammation. The appendix is normal. Vascular/Lymphatic: Moderate aortoiliac atherosclerotic disease. The IVC is unremarkable. No portal venous gas. There is no adenopathy. Reproductive: The prostate and seminal vesicles are grossly unremarkable. Other: Small fat containing bilateral inguinal hernias. Musculoskeletal: Osteopenia with degenerative changes. Bilateral L5 pars defects with grade 1 anterolisthesis. No acute osseous pathology. IMPRESSION: 1. Right lower lobe pulmonary artery embolus with evidence of right heart strain. Correlation with EKG and troponin level recommended. 2. Similar appearance of right perihilar consolidation extending into the right lower lobe with air bronchogram likely post radiation changes. 3. Similar appearance of subsolid nodular density in the right lower lobe. The previously seen 7 mm nodule at the left lung base and subsolid nodule in the medial right lower lobe are not identified on today's exam. 4. Focal area of luminal narrowing and thickening of the mid transverse colon may correspond to the known transverse colon mass. No bowel obstruction. 5. A 5 mm focus of nodularity along the right lateral bladder wall. Further evaluation with cystoscopy is recommended. 6.  Aortic Atherosclerosis (ICD10-I70.0). These results will be called to the ordering clinician or representative by the Radiologist Assistant, and communication documented in the PACS or [employer]. Electronically Signed   By: Vanetta Chou M.D.   On: 04/19/2024 14:04    Microbiology: Results for orders placed or performed in visit on 01/25/24  Fecal occult blood, imunochemical  Status: Abnormal   Collection Time: 01/25/24 11:06 AM   Specimen: Stool  Result Value Ref Range Status   Fecal Occult Bld Positive (A) Negative  Final    Labs: CBC: Recent Labs  Lab 04/19/24 1100 04/19/24 1909  WBC 9.2 9.8  NEUTROABS 6.1  --   HGB 14.5 14.2  HCT 43.3 41.9  MCV 95.2 96.3  PLT 162 146*   Basic Metabolic Panel: Recent Labs  Lab 04/19/24 1100 04/19/24 1909  NA 138 134*  K 4.4 4.3  CL 101 102  CO2 26 22  GLUCOSE 65* 100*  BUN 10 9  CREATININE 0.99 0.83  CALCIUM  9.1 8.9   Liver Function Tests: Recent Labs  Lab 04/19/24 1100  AST 24  ALT 17  ALKPHOS 80  BILITOT 0.5  PROT 7.4  ALBUMIN 4.3   CBG: No results for input(s): GLUCAP in the last 168 hours.  Discharge time spent: greater than 30 minutes.  Signed: MDALA-GAUSI, Josafat Enrico AGATHA, MD [facility] 04/20/2024 "

## 2024-04-20 NOTE — Telephone Encounter (Signed)
 This patient is hospitalized at [facility]. FYI. GM

## 2024-04-20 NOTE — Progress Notes (Signed)
 PHARMACY - ANTICOAGULATION CONSULT NOTE  Pharmacy Consult for IV heparin  Indication: pulmonary embolus  Allergies[1]  Patient Measurements:    Vital Signs: Temp: 98.9 F (37.2 C) (01/22 0620) Temp Source: Oral (01/22 0620) BP: 140/83 (01/22 0800) Pulse Rate: 70 (01/22 0800)  Labs: Recent Labs    04/19/24 1100 04/19/24 1909 04/19/24 2051 04/20/24 0543  HGB 14.5 14.2  --   --   HCT 43.3 41.9  --   --   PLT 162 146*  --   --   APTT  --   --  68* >200*  HEPARINUNFRC  --   --  >1.10* >1.10*  CREATININE 0.99 0.83  --   --     Estimated Creatinine Clearance: 79.4 mL/min (by C-G formula based on SCr of 0.83 mg/dL).   Medical History: Past Medical History:  Diagnosis Date   Arthritis    Cataract    left   Colon cancer (HCC) 04/18/2024   COLONIC POLYPS, HX OF 04/05/2007   ONE ADENOMATOUS POLYP AND ONE HYPERPLASTIC POLYP   DEPRESSION 03/04/2007   DERMATITIS, SEBORRHEIC 07/19/2007   Dysrhythmia    FIBRILLATION, ATRIAL 03/04/2007   Hearing aid worn    B/L   History of kidney stones    HYPERLIPIDEMIA 03/04/2007   HYPERTENSION 03/04/2007   Lung cancer (HCC)    non small cell lung ca   Lung mass    mediastinal adenopathy    Medications:  Scheduled:   amLODipine   5 mg Oral Daily   atenolol   50 mg Oral BID   atorvastatin   10 mg Oral Daily   fluticasone  furoate-vilanterol  1 puff Inhalation Daily   multivitamin  1 tablet Oral BID   multivitamin with minerals  1 tablet Oral Daily    Assessment: Pharmacy is consulted to start heparin  infusion for this 84 yo male diagnosed with PE. Pt had been on Xarelto  20 mg daily which was held for EGD and resumed on 1/20 after holding ~ 6 days. Patient underwent imaging as part of ongoing workup for newly diagnosed colon cancer. Imaging revealed right lower lobe pulmonary artery embolus with evidence of right heart strain. Patient was referred to the hospital for further workup.   Today, 04/20/24 -aPTT > 200 - supratherapeutic  with heparin  infusing at 1500 units/hr -Confirmed with RN that labs were collected from right forearm; heparin  is infusing via port which is above the collection site -Heparin  level > 1.10 - expected in setting of Xarelto  -No other complications of therapy noted   Goal of Therapy:  Heparin  level 0.3-0.7 aPTT 66-102  Monitor platelets by anticoagulation protocol: Yes   Plan:  -Hold heparin  infusion x 1 hour then resume at lower rate of 1300 units/hr -Recheck aPTT 8 hours after resuming infusion -Daily CBC and heparin  levenl -Monitor for signs and symptoms of bleeding   Stefano MARLA Bologna, PharmD, BCPS Clinical Pharmacist 04/20/2024 8:22 AM         [1]  Allergies Allergen Reactions   Hydrocodone  Other (See Comments)    Lost my appetite, loss of energy

## 2024-04-25 MED ORDER — METRONIDAZOLE 250 MG PO TABS: 250.0000 mg | ORAL_TABLET | Freq: Four times a day (QID) | ORAL | 0 refills | Status: AC

## 2024-04-25 MED ORDER — OMEPRAZOLE 20 MG PO CPDR: 20.0000 mg | DELAYED_RELEASE_CAPSULE | Freq: Two times a day (BID) | ORAL | 0 refills | Status: AC

## 2024-04-25 MED ORDER — BISMUTH SUBSALICYLATE 262 MG PO TABS: 2.0000 | ORAL_TABLET | Freq: Four times a day (QID) | ORAL | 0 refills | Status: AC

## 2024-04-25 MED ORDER — TETRACYCLINE HCL 500 MG PO CAPS: 500.0000 mg | ORAL_CAPSULE | Freq: Four times a day (QID) | ORAL | 0 refills | Status: AC

## 2024-04-27 ENCOUNTER — Inpatient Hospital Stay: Admitting: Internal Medicine

## 2024-04-27 VITALS — BP 100/63 | HR 52 | Temp 97.6°F | Resp 17 | Ht 71.0 in | Wt 201.0 lb

## 2024-04-27 DIAGNOSIS — C3490 Malignant neoplasm of unspecified part of unspecified bronchus or lung: Secondary | ICD-10-CM

## 2024-04-27 NOTE — Progress Notes (Signed)
 "     [facility] [phone]   [phone]  OFFICE PROGRESS NOTE  Katrinka Garnette KIDD, MD [address] [place] [address]  DIAGNOSIS:  1) Stage IIIb (T3, N2, M0) non-small cell lung cancer, squamous cell carcinoma presented with right lower lobe lung mass in addition to subcarinal lymphadenopathy diagnosed in October 2020.  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 3) incidental finding of right lower lobe pulmonary artery embolus on CT scan of the chest on April 19, 2024.  PRIOR THERAPY:  1) Weekly concurrent chemoradiation with Carboplatin  for an AUC of 2 Paclitaxel  45 mg/m2. First dose 01/16/2019. Status post 5 cycles.  Last dose was given February 13, 2019. 2) Consolidation treatment with immunotherapy with Imfinzi  1500 mg IV every 4 weeks.  First dose April 05, 2019.  Status post 5 cycles.  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  CURRENT THERAPY: Xarelto  10 mg p.o. daily.  INTERVAL HISTORY: Martin Bailey 84 y.o. male returns to the clinic today for 23-month follow-up visit.  Discussed the use of AI scribe software for clinical note transcription with the patient, who gave verbal consent to proceed.  History of Present Illness Martin Bailey is an 84 year old male with stage IIIb squamous cell carcinoma of the lung and newly diagnosed transverse colon adenocarcinoma who presents for evaluation of colon cancer diagnosis and review of recent imaging.  He was diagnosed with stage IIIb squamous cell carcinoma of the lung in October 2020 and completed concurrent chemoradiation with carboplatin  and paclitaxel , followed by five cycles of consolidation immunotherapy.  In January 2026, he was diagnosed with transverse colon adenocarcinoma. He has not yet undergone surgical staging and is awaiting consultation with a colorectal surgeon,  which was delayed due to inclement weather. He seeks information regarding surgical implications, including the possibility of a colostomy, effects on digestion, and recovery time. He prioritizes quality of life over longevity and would not accept a permanent colostomy. He is concerned about the potential need for chemotherapy, pending surgical staging.  He was incidentally found to have a right lower lobe pulmonary embolism in January 2026 and is currently managed with Xarelto . He resumed anticoagulation after holding it for two days prior to a procedure, as instructed. He reports no new symptoms related to the embolism.  He describes significant fatigue and exertional exhaustion, previously experiencing tiredness with minimal activity. After pulmonology evaluation, he was prescribed an inhaler, resulting in marked improvement in symptoms. This improvement facilitated completion of colonoscopy and endoscopy, leading to the diagnosis of colon cancer.     MEDICAL HISTORY: Past Medical History:  Diagnosis Date   Arthritis    Cataract    left   Colon cancer (HCC) 04/18/2024   COLONIC POLYPS, HX OF 04/05/2007   ONE ADENOMATOUS POLYP AND ONE HYPERPLASTIC POLYP   DEPRESSION 03/04/2007   DERMATITIS, SEBORRHEIC 07/19/2007   Dysrhythmia    FIBRILLATION, ATRIAL 03/04/2007   Hearing aid worn    B/L   History of kidney stones    HYPERLIPIDEMIA 03/04/2007   HYPERTENSION 03/04/2007   Lung cancer (HCC)    non small cell lung ca   Lung mass    mediastinal adenopathy    ALLERGIES:  is allergic to hydrocodone .  MEDICATIONS:  Current Outpatient Medications  Medication Sig Dispense Refill   amLODipine  (NORVASC ) 5 MG tablet TAKE 1 TABLET BY MOUTH DAILY 100 tablet 2   atenolol  (TENORMIN ) 50 MG tablet  TAKE 1 TABLET BY MOUTH TWICE  DAILY 200 tablet 2   atorvastatin  (LIPITOR) 10 MG tablet TAKE 1 TABLET BY MOUTH DAILY 100 tablet 2   Bismuth  Subsalicylate 262 MG TABS Take 2 tablets (524 mg total) by  mouth in the morning, at noon, in the evening, and at bedtime for 14 days. 112 tablet 0   budesonide -formoterol  (SYMBICORT ) 80-4.5 MCG/ACT inhaler Inhale 2 puffs into the lungs in the morning and at bedtime. 1 each 2   metroNIDAZOLE  (FLAGYL ) 250 MG tablet Take 1 tablet (250 mg total) by mouth 4 (four) times daily for 14 days. 56 tablet 0   Multiple Vitamin (MULTIVITAMIN WITH MINERALS) TABS tablet Take 1 tablet by mouth daily with breakfast.     Multiple Vitamins-Minerals (PRESERVISION AREDS 2) CAPS Take 1 capsule by mouth 2 (two) times daily.     omeprazole  (PRILOSEC) 20 MG capsule Take 1 capsule (20 mg total) by mouth 2 (two) times daily before a meal. 28 capsule 0   Rivaroxaban  (XARELTO ) 15 MG TABS tablet Take 1 tablet (15 mg total) by mouth 2 (two) times daily with a meal. After completion, resume your prior dose of Xarelto  (20 mg daily). 42 tablet 0   rivaroxaban  (XARELTO ) 20 MG TABS tablet Take 1 tablet (20 mg total) by mouth daily with supper. Resume after completion of 3 weeks of higher dose (15 mg BID) of Xarelto .     tetracycline  (SUMYCIN ) 500 MG capsule Take 1 capsule (500 mg total) by mouth 4 (four) times daily for 14 days. 56 capsule 0   No current facility-administered medications for this visit.    SURGICAL HISTORY:  Past Surgical History:  Procedure Laterality Date   BIOPSY  01/09/2020   Procedure: BIOPSY;  Surgeon: Neda Jennet LABOR, MD;  Location: [facility];  Service: Endoscopy;;   BRONCHIAL WASHINGS  01/09/2020   Procedure: BRONCHIAL WASHINGS;  Surgeon: Neda Jennet LABOR, MD;  Location: [facility];  Service: Endoscopy;;   CATARACT EXTRACTION W/ INTRAOCULAR LENS IMPLANT     right   COLONOSCOPY     IR IMAGING GUIDED PORT INSERTION  01/31/2019   JOINT REPLACEMENT  06/23/2023   PARTIAL KNEE ARTHROPLASTY Right 06/23/2023   Procedure: UNICOMPARTMENTAL KNEE-MEDIAL;  Surgeon: [name] Toribio LABOR, MD;  Location: [facility];  Service: Orthopedics;  Laterality: Right;    RADIOFREQUENCY ABLATION     x2 for fib, on 2nd attempt switched form a flutter ot a fib.    ROTATOR CUFF REPAIR Right    TONSILLECTOMY     VIDEO BRONCHOSCOPY N/A 01/09/2020   Procedure: VIDEO BRONCHOSCOPY WITH FLUORO;  Surgeon: Neda Jennet LABOR, MD;  Location: [facility];  Service: Endoscopy;  Laterality: N/A;   VIDEO BRONCHOSCOPY WITH ENDOBRONCHIAL ULTRASOUND Left 12/30/2018   Procedure: VIDEO BRONCHOSCOPY WITH ENDOBRONCHIAL ULTRASOUND WITH FLUOROSCOPY;  Surgeon: Neda Jennet LABOR, MD;  Location: [facility];  Service: Thoracic;  Laterality: Left;   WISDOM TOOTH EXTRACTION      REVIEW OF SYSTEMS:  Constitutional: positive for fatigue Eyes: negative Ears, nose, mouth, throat, and face: negative Respiratory: negative Cardiovascular: negative Gastrointestinal: negative Genitourinary:negative Integument/breast: negative Hematologic/lymphatic: negative Musculoskeletal:negative Neurological: negative Behavioral/Psych: negative Endocrine: negative Allergic/Immunologic: negative   PHYSICAL EXAMINATION: General appearance: alert, cooperative, fatigued, and no distress Head: Normocephalic, without obvious abnormality, atraumatic Neck: no adenopathy, no JVD, supple, symmetrical, trachea midline, and thyroid  not enlarged, symmetric, no tenderness/mass/nodules Lymph nodes: Cervical, supraclavicular, and axillary nodes normal. Resp: clear to auscultation bilaterally Back: symmetric, no curvature. ROM normal. No CVA tenderness. Cardio: regular  rate and rhythm, S1, S2 normal, no murmur, click, rub or gallop GI: soft, non-tender; bowel sounds normal; no masses,  no organomegaly Extremities: extremities normal, atraumatic, no cyanosis or edema Neurologic: Alert and oriented X 3, normal strength and tone. Normal symmetric reflexes. Normal coordination and gait  ECOG PERFORMANCE STATUS: 1 - Symptomatic but completely ambulatory  Blood pressure 100/63, pulse (!) 52, temperature 97.6 F (36.4 C),  temperature source Temporal, resp. rate 17, height 5' 11 (1.803 m), weight 201 lb (91.2 kg), SpO2 100%.  LABORATORY DATA: Lab Results  Component Value Date   WBC 9.8 04/19/2024   HGB 14.2 04/19/2024   HCT 41.9 04/19/2024   MCV 96.3 04/19/2024   PLT 146 (L) 04/19/2024      Chemistry      Component Value Date/Time   NA 134 (L) 04/19/2024 1909   K 4.3 04/19/2024 1909   CL 102 04/19/2024 1909   CO2 22 04/19/2024 1909   BUN 9 04/19/2024 1909   CREATININE 0.83 04/19/2024 1909   CREATININE 0.99 04/19/2024 1100      Component Value Date/Time   CALCIUM  8.9 04/19/2024 1909   ALKPHOS 80 04/19/2024 1100   AST 24 04/19/2024 1100   ALT 17 04/19/2024 1100   BILITOT 0.5 04/19/2024 1100       RADIOGRAPHIC STUDIES: ECHOCARDIOGRAM COMPLETE Result Date: 04/20/2024    ECHOCARDIOGRAM REPORT   Patient Name:   REG BIRCHER Bleicher Date of Exam: 04/20/2024 Medical Rec #:  [id number]            Height:       70.0 in Accession #:    [id number]           Weight:       202.0 lb Date of Birth:  May 20, 1940             BSA:          2.096 m Patient Age:    83 years             BP:           141/93 mmHg Patient Gender: M                    HR:           72 bpm. Exam Location:  Inpatient Procedure: 2D Echo, Cardiac Doppler, Color Doppler and Intracardiac            Opacification Agent (Both Spectral and Color Flow Doppler were            utilized during procedure). Indications:    PE  History:        Patient has prior history of Echocardiogram examinations, most                 recent 05/12/2023.  Sonographer:    Odella Brewster Referring Phys: [id number] [facility]  Sonographer Comments: Technically difficult study due to poor echo windows. Image acquisition challenging due to respiratory motion. IMPRESSIONS  1. Left ventricular ejection fraction, by estimation, is 55 to 60%. The left ventricle has normal function. The left ventricle has no regional wall motion abnormalities. There is mild left ventricular hypertrophy.  Left ventricular diastolic function could not be evaluated.  2. Right ventricular systolic function is normal. The right ventricular size is normal.  3. Left atrial size was severely dilated.  4. Right atrial size was mildly dilated.  5. The mitral valve is degenerative. No evidence of mitral valve regurgitation. No  evidence of mitral stenosis.  6. The aortic valve was not well visualized. Aortic valve regurgitation is not visualized. No aortic stenosis is present.  7. The inferior vena cava is dilated in size with >50% respiratory variability, suggesting right atrial pressure of 8 mmHg. Comparison(s): A prior study was performed on 05/12/2023. No significant change from prior study. Conclusion(s)/Recommendation(s): No left ventricular mural or apical thrombus/thrombi. FINDINGS  Left Ventricle: Left ventricular ejection fraction, by estimation, is 55 to 60%. The left ventricle has normal function. The left ventricle has no regional wall motion abnormalities. Definity  contrast agent was given IV to delineate the left ventricular  endocardial borders. The left ventricular internal cavity size was small. There is mild left ventricular hypertrophy. Left ventricular diastolic function could not be evaluated due to atrial fibrillation. Left ventricular diastolic function could not be  evaluated. Right Ventricle: The right ventricular size is normal. Right vetricular wall thickness was not well visualized. Right ventricular systolic function is normal. Left Atrium: Left atrial size was severely dilated. Right Atrium: Right atrial size was mildly dilated. Pericardium: There is no evidence of pericardial effusion. Mitral Valve: The mitral valve is degenerative in appearance. Mild mitral annular calcification. No evidence of mitral valve regurgitation. No evidence of mitral valve stenosis. MV peak gradient, 7.1 mmHg. The mean mitral valve gradient is 2.0 mmHg. Tricuspid Valve: The tricuspid valve is normal in structure.  Tricuspid valve regurgitation is trivial. No evidence of tricuspid stenosis. Aortic Valve: The aortic valve was not well visualized. Aortic valve regurgitation is not visualized. No aortic stenosis is present. Aortic valve mean gradient measures 3.0 mmHg. Aortic valve peak gradient measures 5.6 mmHg. Aortic valve area, by VTI measures 2.70 cm. Pulmonic Valve: The pulmonic valve was not well visualized. Pulmonic valve regurgitation is not visualized. Aorta: The aortic root is normal in size and structure. Venous: The inferior vena cava is dilated in size with greater than 50% respiratory variability, suggesting right atrial pressure of 8 mmHg. IAS/Shunts: There is right bowing of the interatrial septum, suggestive of elevated left atrial pressure. The atrial septum is grossly normal. Additional Comments: A venous catheter is visualized.  LEFT VENTRICLE PLAX 2D LVIDd:         3.60 cm     Diastology LVIDs:         2.30 cm     LV e' medial:    7.29 cm/s LV PW:         1.50 cm     LV E/e' medial:  15.9 LV IVS:        1.20 cm     LV e' lateral:   9.90 cm/s LVOT diam:     2.30 cm     LV E/e' lateral: 11.7 LV SV:         59 LV SV Index:   28 LVOT Area:     4.15 cm LV IVRT:       120 msec  LV Volumes (MOD) LV vol d, MOD A2C: 86.9 ml LV vol d, MOD A4C: 80.4 ml LV vol s, MOD A2C: 34.4 ml LV vol s, MOD A4C: 28.3 ml LV SV MOD A2C:     52.5 ml LV SV MOD A4C:     80.4 ml LV SV MOD BP:      55.0 ml RIGHT VENTRICLE             IVC RV S prime:     10.00 cm/s  IVC diam: 2.10 cm TAPSE (M-mode): 1.9 cm  PULMONARY VEINS                             Diastolic Velocity: 40.40 cm/s                             S/D Velocity:       0.70                             Systolic Velocity:  29.00 cm/s LEFT ATRIUM              Index        RIGHT ATRIUM           Index LA diam:        5.90 cm  2.81 cm/m   RA Area:     24.60 cm LA Vol (A2C):   184.0 ml 87.78 ml/m  RA Volume:   79.50 ml  37.93 ml/m LA Vol (A4C):   177.0 ml  84.44 ml/m LA Biplane Vol: 190.0 ml 90.64 ml/m  AORTIC VALVE                    PULMONIC VALVE AV Area (Vmax):    2.22 cm     PV Vmax:          0.76 m/s AV Area (Vmean):   2.38 cm     PV Peak grad:     2.3 mmHg AV Area (VTI):     2.70 cm     PR End Diast Vel: 5.11 msec AV Vmax:           118.50 cm/s AV Vmean:          86.200 cm/s AV VTI:            0.220 m AV Peak Grad:      5.6 mmHg AV Mean Grad:      3.0 mmHg LVOT Vmax:         63.30 cm/s LVOT Vmean:        49.300 cm/s LVOT VTI:          0.143 m LVOT/AV VTI ratio: 0.65  AORTA Ao Root diam: 4.00 cm MITRAL VALVE MV Area (PHT): 3.33 cm     SHUNTS MV Area VTI:   1.90 cm     Systemic VTI:  0.14 m MV Peak grad:  7.1 mmHg     Systemic Diam: 2.30 cm MV Mean grad:  2.0 mmHg MV Vmax:       1.33 m/s MV Vmean:      68.6 cm/s MV E velocity: 116.00 cm/s Sunit Tolia Electronically signed by Madonna Large Signature Date/Time: 04/20/2024/12:04:10 PM    Final    CT CHEST ABDOMEN PELVIS W CONTRAST Result Date: 04/19/2024 CLINICAL DATA:  Colon and lung cancer.  Staging. EXAM: CT CHEST, ABDOMEN, AND PELVIS WITH CONTRAST TECHNIQUE: Multidetector CT imaging of the chest, abdomen and pelvis was performed following the standard protocol during bolus administration of intravenous contrast. RADIATION DOSE REDUCTION: This exam was performed according to the departmental dose-optimization program which includes automated exposure control, adjustment of the mA and/or kV according to patient size and/or use of iterative reconstruction technique. CONTRAST:  OMNIPAQUE  IOHEXOL  300 MG/ML  SOLN COMPARISON:  Chest CT dated 01/19/2024 FINDINGS: CT CHEST FINDINGS Cardiovascular: There is dilatation of the left atrium similar to prior CT.  No pericardial effusion. Coronary vascular calcification. Right-sided Port-A-Cath with tip in the region of the cavoatrial junction. Mild atherosclerotic calcification of the thoracic aorta. No as well dilatation or dissection. The origins of the great  vessels of the aortic arch appear patent. There is thrombus in the lobar branch of the right lower lobe pulmonary artery. The RV/LV ratio is approximately 1.0 suggestive of right heart strain. Correlation with EKG and troponin level recommended. Mediastinum/Nodes: No hilar or mediastinal adenopathy. Evaluation of the right hilar lymph node is however limited due to consolidative changes of the adjacent lung. Similar appearance of a small lymph node in the anterior mediastinum. The soft a Gus is grossly under. Small amount of fluid along the esophagus. Lungs/Pleura: Similar appearance of right perihilar consolidation extending into the right lower lobe with air bronchogram likely post radiation changes. The previously seen 7 mm nodule at the left lung base is not identified on today's exam. The previously described subsolid nodule in the medial right lower lobe is not identified on today's exam. An 18 x 6 mm subsolid nodular density in the right lower lobe (96/6) similar to prior CT. No new nodule. There is a small right pleural effusion similar to prior CT. No pneumothorax. The central airways are patent. Musculoskeletal: Osteopenia with degenerative changes of the spine. No acute osseous pathology. No suspicious bone lesions. Similar appearance of compression fracture of superior endplate of T7. CT ABDOMEN PELVIS FINDINGS No intra-abdominal free air or free fluid. Hepatobiliary: The liver is unremarkable. No biliary dilatation. The gallbladder is unremarkable Pancreas: Unremarkable. No pancreatic ductal dilatation or surrounding inflammatory changes. Spleen: Normal in size without focal abnormality. Adrenals/Urinary Tract: The adrenal glands unremarkable. There is no hydronephrosis on either side. There is symmetric enhancement and excretion of contrast by both kidneys. The visualized ureters appear unremarkable. There is a 5 mm focus of nodularity along the right lateral bladder wall (116/2 and 92/4) which is not  characterized on this CT. A bladder mass is not excluded further evaluation with cystoscopy is recommended. Stomach/Bowel: Focal area of luminal narrowing and thickening of the mid transverse colon (71/2 and coronal 30/4) may correspond to the known transverse colon mass. There is moderate stool throughout the colon. There is no bowel obstruction or active inflammation. The appendix is normal. Vascular/Lymphatic: Moderate aortoiliac atherosclerotic disease. The IVC is unremarkable. No portal venous gas. There is no adenopathy. Reproductive: The prostate and seminal vesicles are grossly unremarkable. Other: Small fat containing bilateral inguinal hernias. Musculoskeletal: Osteopenia with degenerative changes. Bilateral L5 pars defects with grade 1 anterolisthesis. No acute osseous pathology. IMPRESSION: 1. Right lower lobe pulmonary artery embolus with evidence of right heart strain. Correlation with EKG and troponin level recommended. 2. Similar appearance of right perihilar consolidation extending into the right lower lobe with air bronchogram likely post radiation changes. 3. Similar appearance of subsolid nodular density in the right lower lobe. The previously seen 7 mm nodule at the left lung base and subsolid nodule in the medial right lower lobe are not identified on today's exam. 4. Focal area of luminal narrowing and thickening of the mid transverse colon may correspond to the known transverse colon mass. No bowel obstruction. 5. A 5 mm focus of nodularity along the right lateral bladder wall. Further evaluation with cystoscopy is recommended. 6.  Aortic Atherosclerosis (ICD10-I70.0). These results will be called to the ordering clinician or representative by the Radiologist Assistant, and communication documented in the PACS or [employer]. Electronically Signed   By: Vanetta  Radparvar M.D.   On: 04/19/2024 14:04    ASSESSMENT AND PLAN: This is a very pleasant 84 years old white male with a stage  IIIb non-small cell lung cancer, squamous cell carcinoma diagnosed in October 2020 status post course of concurrent chemoradiation with 5 cycles of chemotherapy with carboplatin  and paclitaxel . The patient tolerated his treatment well except for pancytopenia. The patient had partial response after his treatment. He underwent consolidation treatment with immunotherapy with Imfinzi  1500 mg IV every 4 weeks status post 5  cycles.   His treatment was discontinued because of the persistent and recurrent immunotherapy mediated pneumonitis.  This was treated with a tapered dose of prednisone  and improved.   The patient has been on observation for the last few years.  He has been complaining of increasing fatigue and shortness of breath recently. He was also recently diagnosed with adenocarcinoma of the transverse colon awaiting surgical intervention.  The patient also has a history of right lower lobe pulmonary embolism. He had repeat CT scan of the chest, abdomen pelvis performed recently.  I personally dependently reviewed the scan and discussed the result with the patient today.  His scan showed no concerning findings for disease progression. Assessment and Plan Assessment & Plan Transverse colon adenocarcinoma Recently diagnosed transverse colon adenocarcinoma. Surgical staging is pending and will determine prognosis and need for adjuvant therapy. CT imaging shows no distant metastasis; lymph node involvement remains unknown until surgery. Discussed quality of life concerns, including the possibility of a colostomy, which would likely be temporary if required. He prioritized quality of life over longevity. Anticipated surgical recovery is generally favorable, with most procedures being minimally invasive and not significantly affecting digestion. If extensive treatment is needed, care will be transitioned to a medical oncologist specializing in colon cancer. - Referred to colorectal surgeon for evaluation  and surgical management. - Advised him to prepare questions for surgical consultation, including concerns about colostomy and recovery. - Discussed that staging and need for further treatment, such as chemotherapy, will be determined after surgery and pathological review. - Case will be reviewed at tumor board post-operatively to determine further management. - If extensive treatment is required, care will be transitioned to a medical oncologist specializing in colon cancer.  Stage IIIb squamous cell carcinoma of the lung Stage IIIb non-small cell lung cancer treated with concurrent chemoradiation and consolidation immunotherapy. Prognosis has improved and disease is currently well-controlled and considered less serious than the colon cancer. He is also followed by pulmonology.  Pulmonary embolism Recent right lower lobe pulmonary artery embolus, currently managed with Xarelto . He resumed anticoagulation after a brief peri-procedural hold and is back on his regular regimen. - Continue Xarelto  for anticoagulation. - Reviewed appropriate peri-procedural management of Xarelto .  He was advised to call immediately if he has any other concerning symptoms in the interval.  The patient voices understanding of current disease status and treatment options and is in agreement with the current care plan.  All questions were answered. The patient knows to call the clinic with any problems, questions or concerns. We can certainly see the patient much sooner if necessary. The total time spent in the appointment was 30 minutes including review of chart and various tests results, discussions about plan of care and coordination of care plan .  Disclaimer: This note was dictated with voice recognition software. Similar sounding words can inadvertently be transcribed and may not be corrected upon review.       "

## 2024-05-03 ENCOUNTER — Telehealth: Payer: Self-pay | Admitting: Family Medicine

## 2024-05-03 NOTE — Telephone Encounter (Signed)
 Left vm for pt requesting call back or mychart message regarding more info needed for referral.    Copied from CRM #[id number]. Topic: Referral - Request for Referral >> May 03, 2024  9:54 AM Burnard DEL wrote: Did the patient discuss referral with their provider in the last year? No (If No - schedule appointment) (If Yes - send message)  Appointment offered? No  Type of order/referral and detailed reason for visit: Patient has [facility] HMO and referral needs to be put into [facility] website/ Patient is at appointment today 05/03/2024  -colon cancer  Preference of office, provider, location: [facility] [place] [facility]  If referral order, have you been seen by this specialty before? No (If Yes, this issue or another issue? When? Where?  Can we respond through MyChart? No

## 2024-05-04 ENCOUNTER — Telehealth (HOSPITAL_BASED_OUTPATIENT_CLINIC_OR_DEPARTMENT_OTHER): Payer: Self-pay | Admitting: *Deleted

## 2024-05-04 NOTE — Telephone Encounter (Signed)
 Confirmed with surgeon office procedure to be done.      Pre-operative Risk Assessment    Patient Name: Martin Bailey  DOB: [birth year] MRN: [id number]   Date of last office visit: 05/11/23 DR. KRASOWSKI Date of next office visit: NONE   Request for Surgical Clearance    Procedure:  LAPAROSCOPIC RIGHT HEMICOLECTOMY   Date of Surgery:  Clearance TBD                                Surgeon:  DR. LONNI WHITE Surgeon's Group or Practice Name:  [facility] Phone number:  [phone] Fax number:  [phone] Martin Bailey, Martin Bailey   Type of Clearance Requested:   - Medical  - Pharmacy:  Hold Rivaroxaban  (Xarelto )     Type of Anesthesia:  General    Additional requests/questions:    Martin Bailey   05/04/2024, 11:44 AM

## 2024-05-05 NOTE — Telephone Encounter (Signed)
" ° °  Name: Martin Bailey  DOB: 1940/12/24  MRN: [id number]  Primary Cardiologist: Lonni LITTIE Nanas, MD  Chart reviewed as part of pre-operative protocol coverage. Because of Martin Bailey's past medical history and time since last visit, he will require a follow-up in-office visit in order to better assess preoperative cardiovascular risk. Last appointment with Dr. Krasowski, on 05/11/2023.   Pre-op covering staff: - Please schedule appointment and call patient to inform them. If patient already had an upcoming appointment within acceptable timeframe, please add pre-op clearance to the appointment notes so provider is aware. - Please contact requesting surgeon's office via preferred method (i.e, phone, fax) to inform them of need for appointment prior to surgery.  This message will also be routed to Dr Bernie  for input on holding Xarelto  as requested below so that this information is available to the clearing provider at time of patient's appointment.   Martin Satterfield, NP  05/05/2024, 8:32 AM   "

## 2024-05-05 NOTE — Telephone Encounter (Signed)
 OV preop appt now scheduled.

## 2024-05-18 ENCOUNTER — Ambulatory Visit (HOSPITAL_BASED_OUTPATIENT_CLINIC_OR_DEPARTMENT_OTHER): Admitting: Pulmonary Disease

## 2024-05-23 ENCOUNTER — Ambulatory Visit (HOSPITAL_BASED_OUTPATIENT_CLINIC_OR_DEPARTMENT_OTHER): Admitting: Family

## 2024-09-26 ENCOUNTER — Encounter: Admitting: Family Medicine

## 2024-10-24 ENCOUNTER — Inpatient Hospital Stay

## 2024-10-31 ENCOUNTER — Inpatient Hospital Stay: Admitting: Internal Medicine
